# Patient Record
Sex: Male | Born: 1945
Health system: Southern US, Community
[De-identification: ages and names within clinical notes are randomized; demographics above are authoritative.]

## PROBLEM LIST (undated history)

## (undated) DIAGNOSIS — C801 Malignant (primary) neoplasm, unspecified: Secondary | ICD-10-CM

## (undated) DIAGNOSIS — G4700 Insomnia, unspecified: Secondary | ICD-10-CM

## (undated) DIAGNOSIS — K2270 Barrett's esophagus without dysplasia: Secondary | ICD-10-CM

## (undated) DIAGNOSIS — J309 Allergic rhinitis, unspecified: Secondary | ICD-10-CM

## (undated) DIAGNOSIS — B1710 Acute hepatitis C without hepatic coma: Secondary | ICD-10-CM

## (undated) DIAGNOSIS — M1990 Unspecified osteoarthritis, unspecified site: Secondary | ICD-10-CM

## (undated) DIAGNOSIS — I10 Essential (primary) hypertension: Secondary | ICD-10-CM

## (undated) DIAGNOSIS — I82509 Chronic embolism and thrombosis of unspecified deep veins of unspecified lower extremity: Secondary | ICD-10-CM

## (undated) DIAGNOSIS — K219 Gastro-esophageal reflux disease without esophagitis: Secondary | ICD-10-CM

## (undated) DIAGNOSIS — H9319 Tinnitus, unspecified ear: Secondary | ICD-10-CM

## (undated) DIAGNOSIS — N318 Other neuromuscular dysfunction of bladder: Secondary | ICD-10-CM

## (undated) DIAGNOSIS — E785 Hyperlipidemia, unspecified: Secondary | ICD-10-CM

## (undated) DIAGNOSIS — I82409 Acute embolism and thrombosis of unspecified deep veins of unspecified lower extremity: Secondary | ICD-10-CM

## (undated) DIAGNOSIS — F411 Generalized anxiety disorder: Secondary | ICD-10-CM

## (undated) DIAGNOSIS — Z87442 Personal history of urinary calculi: Secondary | ICD-10-CM

## (undated) DIAGNOSIS — I739 Peripheral vascular disease, unspecified: Secondary | ICD-10-CM

## (undated) DIAGNOSIS — N400 Enlarged prostate without lower urinary tract symptoms: Secondary | ICD-10-CM

## (undated) DIAGNOSIS — K449 Diaphragmatic hernia without obstruction or gangrene: Secondary | ICD-10-CM

## (undated) DIAGNOSIS — Q210 Ventricular septal defect: Secondary | ICD-10-CM

## (undated) DIAGNOSIS — H33039 Retinal detachment with giant retinal tear, unspecified eye: Secondary | ICD-10-CM

## (undated) DIAGNOSIS — K759 Inflammatory liver disease, unspecified: Secondary | ICD-10-CM

## (undated) DIAGNOSIS — R972 Elevated prostate specific antigen [PSA]: Secondary | ICD-10-CM

## (undated) DIAGNOSIS — R011 Cardiac murmur, unspecified: Secondary | ICD-10-CM

## (undated) DIAGNOSIS — M810 Age-related osteoporosis without current pathological fracture: Secondary | ICD-10-CM

## (undated) HISTORY — DX: Elevated prostate specific antigen (PSA): R97.20

## (undated) HISTORY — DX: Retinal detachment with giant retinal tear, unspecified eye: H33.039

## (undated) HISTORY — DX: Barrett's esophagus without dysplasia: K22.70

## (undated) HISTORY — PX: RETINAL DETACHMENT SURGERY: SHX105

## (undated) HISTORY — DX: Diaphragmatic hernia without obstruction or gangrene: K44.9

## (undated) HISTORY — DX: Unspecified osteoarthritis, unspecified site: M19.90

## (undated) HISTORY — DX: Gastro-esophageal reflux disease without esophagitis: K21.9

## (undated) HISTORY — DX: Hyperlipidemia, unspecified: E78.5

## (undated) HISTORY — DX: Benign prostatic hyperplasia without lower urinary tract symptoms: N40.0

## (undated) HISTORY — PX: TOE SURGERY: SHX1073

## (undated) HISTORY — DX: Generalized anxiety disorder: F41.1

## (undated) HISTORY — PX: COLONOSCOPY: SHX174

## (undated) HISTORY — DX: Ventricular septal defect: Q21.0

## (undated) HISTORY — DX: Acute hepatitis C without hepatic coma: B17.10

## (undated) HISTORY — DX: Essential (primary) hypertension: I10

## (undated) HISTORY — DX: Allergic rhinitis, unspecified: J30.9

## (undated) HISTORY — DX: Age-related osteoporosis without current pathological fracture: M81.0

## (undated) HISTORY — DX: Personal history of urinary calculi: Z87.442

## (undated) HISTORY — PX: KNEE SURGERY: SHX244

## (undated) HISTORY — PX: UPPER GI ENDOSCOPY: SHX6162

## (undated) HISTORY — DX: Other neuromuscular dysfunction of bladder: N31.8

## (undated) HISTORY — DX: Chronic embolism and thrombosis of unspecified deep veins of unspecified lower extremity: I82.509

## (undated) HISTORY — DX: Acute embolism and thrombosis of unspecified deep veins of unspecified lower extremity: I82.409

## (undated) HISTORY — DX: Tinnitus, unspecified ear: H93.19

## (undated) HISTORY — DX: Insomnia, unspecified: G47.00

---

## 1998-05-09 ENCOUNTER — Ambulatory Visit (HOSPITAL_COMMUNITY): Admission: RE | Admit: 1998-05-09 | Discharge: 1998-05-09 | Payer: Self-pay | Admitting: Gastroenterology

## 1999-09-26 ENCOUNTER — Ambulatory Visit (HOSPITAL_COMMUNITY): Admission: RE | Admit: 1999-09-26 | Discharge: 1999-09-26 | Payer: Self-pay | Admitting: Internal Medicine

## 1999-09-26 ENCOUNTER — Encounter: Payer: Self-pay | Admitting: Internal Medicine

## 2000-01-15 ENCOUNTER — Ambulatory Visit (HOSPITAL_BASED_OUTPATIENT_CLINIC_OR_DEPARTMENT_OTHER): Admission: RE | Admit: 2000-01-15 | Discharge: 2000-01-15 | Payer: Self-pay

## 2000-04-22 ENCOUNTER — Ambulatory Visit (HOSPITAL_COMMUNITY): Admission: RE | Admit: 2000-04-22 | Discharge: 2000-04-22 | Payer: Self-pay | Admitting: Gastroenterology

## 2000-04-22 ENCOUNTER — Encounter (INDEPENDENT_AMBULATORY_CARE_PROVIDER_SITE_OTHER): Payer: Self-pay

## 2002-04-29 ENCOUNTER — Ambulatory Visit (HOSPITAL_COMMUNITY): Admission: RE | Admit: 2002-04-29 | Discharge: 2002-04-29 | Payer: Self-pay | Admitting: Gastroenterology

## 2002-04-29 ENCOUNTER — Encounter (INDEPENDENT_AMBULATORY_CARE_PROVIDER_SITE_OTHER): Payer: Self-pay | Admitting: *Deleted

## 2003-08-30 ENCOUNTER — Inpatient Hospital Stay (HOSPITAL_COMMUNITY): Admission: EM | Admit: 2003-08-30 | Discharge: 2003-09-01 | Payer: Self-pay | Admitting: Emergency Medicine

## 2003-10-04 ENCOUNTER — Encounter: Admission: RE | Admit: 2003-10-04 | Discharge: 2004-01-02 | Payer: Self-pay | Admitting: Internal Medicine

## 2004-01-18 ENCOUNTER — Ambulatory Visit (HOSPITAL_BASED_OUTPATIENT_CLINIC_OR_DEPARTMENT_OTHER): Admission: RE | Admit: 2004-01-18 | Discharge: 2004-01-18 | Payer: Self-pay | Admitting: Orthopedic Surgery

## 2005-01-22 ENCOUNTER — Ambulatory Visit (HOSPITAL_COMMUNITY): Admission: RE | Admit: 2005-01-22 | Discharge: 2005-01-23 | Payer: Self-pay | Admitting: Ophthalmology

## 2005-04-18 ENCOUNTER — Ambulatory Visit: Payer: Self-pay | Admitting: Internal Medicine

## 2005-06-06 ENCOUNTER — Ambulatory Visit: Payer: Self-pay | Admitting: Internal Medicine

## 2005-09-16 ENCOUNTER — Ambulatory Visit: Payer: Self-pay | Admitting: Internal Medicine

## 2006-04-18 ENCOUNTER — Ambulatory Visit: Payer: Self-pay | Admitting: Internal Medicine

## 2006-04-23 ENCOUNTER — Ambulatory Visit: Payer: Self-pay | Admitting: Internal Medicine

## 2006-05-15 ENCOUNTER — Ambulatory Visit: Payer: Self-pay | Admitting: Internal Medicine

## 2006-10-07 ENCOUNTER — Ambulatory Visit: Payer: Self-pay | Admitting: Family Medicine

## 2006-10-17 ENCOUNTER — Ambulatory Visit: Payer: Self-pay | Admitting: Internal Medicine

## 2006-10-17 LAB — CONVERTED CEMR LAB
ALT: 20 units/L (ref 0–40)
AST: 27 units/L (ref 0–37)
Albumin: 3.9 g/dL (ref 3.5–5.2)
Alkaline Phosphatase: 49 units/L (ref 39–117)
BUN: 11 mg/dL (ref 6–23)
CO2: 29 meq/L (ref 19–32)
Calcium: 9.1 mg/dL (ref 8.4–10.5)
Chloride: 107 meq/L (ref 96–112)
Chol/HDL Ratio, serum: 3.8
Cholesterol: 144 mg/dL (ref 0–200)
Creatinine, Ser: 1.1 mg/dL (ref 0.4–1.5)
GFR calc non Af Amer: 73 mL/min
Glomerular Filtration Rate, Af Am: 88 mL/min/{1.73_m2}
Glucose, Bld: 125 mg/dL — ABNORMAL HIGH (ref 70–99)
HDL: 38.3 mg/dL — ABNORMAL LOW (ref >39.0)
LDL Cholesterol: 95 mg/dL (ref 0–99)
Potassium: 4.1 meq/L (ref 3.5–5.1)
Sodium: 142 meq/L (ref 135–145)
Testosterone, total: 8.0479 ng/mL
Total Bilirubin: 1.2 mg/dL (ref 0.3–1.2)
Total Protein: 6.9 g/dL (ref 6.0–8.3)
Triglyceride fasting, serum: 54 mg/dL (ref 0–149)
VLDL: 11 mg/dL (ref 0–40)

## 2007-04-24 ENCOUNTER — Ambulatory Visit: Payer: Self-pay | Admitting: Internal Medicine

## 2007-04-24 LAB — CONVERTED CEMR LAB
ALT: 22 units/L (ref 0–40)
AST: 25 units/L (ref 0–37)
Albumin: 3.9 g/dL (ref 3.5–5.2)
Alkaline Phosphatase: 56 units/L (ref 39–117)
BUN: 13 mg/dL (ref 6–23)
Basophils Absolute: 0 10*3/uL (ref 0.0–0.1)
Basophils Relative: 0.2 % (ref 0.0–1.0)
Bilirubin Urine: NEGATIVE
Bilirubin, Direct: 0.1 mg/dL (ref 0.0–0.3)
CO2: 29 meq/L (ref 19–32)
Calcium: 9.3 mg/dL (ref 8.4–10.5)
Chloride: 107 meq/L (ref 96–112)
Cholesterol: 139 mg/dL (ref 0–200)
Creatinine, Ser: 0.8 mg/dL (ref 0.4–1.5)
Eosinophils Absolute: 0.1 10*3/uL (ref 0.0–0.6)
Eosinophils Relative: 2.4 % (ref 0.0–5.0)
GFR calc Af Amer: 126 mL/min
GFR calc non Af Amer: 104 mL/min
Glucose, Bld: 91 mg/dL (ref 70–99)
HCT: 41.8 % (ref 39.0–52.0)
HDL: 33.1 mg/dL — ABNORMAL LOW (ref >39.0)
Hemoglobin, Urine: NEGATIVE
Hemoglobin: 14.3 g/dL (ref 13.0–17.0)
Hgb A1c MFr Bld: 5.8 % (ref 4.6–6.0)
Ketones, ur: NEGATIVE mg/dL
LDL Cholesterol: 94 mg/dL (ref 0–99)
Leukocytes, UA: NEGATIVE
Lymphocytes Relative: 28.9 % (ref 12.0–46.0)
MCHC: 34.2 g/dL (ref 30.0–36.0)
MCV: 90.2 fL (ref 78.0–100.0)
Monocytes Absolute: 0.6 10*3/uL (ref 0.2–0.7)
Monocytes Relative: 10.6 % (ref 3.0–11.0)
Neutro Abs: 3.4 10*3/uL (ref 1.4–7.7)
Neutrophils Relative %: 57.9 % (ref 43.0–77.0)
Nitrite: NEGATIVE
PSA: 10.38 ng/mL — ABNORMAL HIGH (ref 0.10–4.00)
Platelets: 221 10*3/uL (ref 150–400)
Potassium: 4.4 meq/L (ref 3.5–5.1)
RBC: 4.63 M/uL (ref 4.22–5.81)
RDW: 13.5 % (ref 11.5–14.6)
Sodium: 141 meq/L (ref 135–145)
Specific Gravity, Urine: >=1.03 (ref 1.000–1.03)
TSH: 1.57 microintl units/mL (ref 0.35–5.50)
Total Bilirubin: 1.2 mg/dL (ref 0.3–1.2)
Total CHOL/HDL Ratio: 4.2
Total Protein, Urine: NEGATIVE mg/dL
Total Protein: 7.7 g/dL (ref 6.0–8.3)
Triglycerides: 58 mg/dL (ref 0–149)
Urine Glucose: NEGATIVE mg/dL
Urobilinogen, UA: 0.2 (ref 0.0–1.0)
VLDL: 12 mg/dL (ref 0–40)
WBC: 5.7 10*3/uL (ref 4.5–10.5)
pH: 6 (ref 5.0–8.0)

## 2007-04-30 ENCOUNTER — Ambulatory Visit: Payer: Self-pay | Admitting: Internal Medicine

## 2007-08-20 ENCOUNTER — Ambulatory Visit: Payer: Self-pay | Admitting: Internal Medicine

## 2007-08-21 ENCOUNTER — Ambulatory Visit: Payer: Self-pay | Admitting: Internal Medicine

## 2007-08-21 ENCOUNTER — Inpatient Hospital Stay (HOSPITAL_COMMUNITY): Admission: EM | Admit: 2007-08-21 | Discharge: 2007-08-23 | Payer: Self-pay | Admitting: *Deleted

## 2007-08-21 ENCOUNTER — Ambulatory Visit: Payer: Self-pay

## 2007-08-25 ENCOUNTER — Ambulatory Visit: Payer: Self-pay | Admitting: Internal Medicine

## 2007-08-25 ENCOUNTER — Encounter: Payer: Self-pay | Admitting: Internal Medicine

## 2007-08-25 LAB — CONVERTED CEMR LAB: INR: 1

## 2007-08-27 ENCOUNTER — Ambulatory Visit: Payer: Self-pay | Admitting: Cardiology

## 2007-08-27 ENCOUNTER — Ambulatory Visit: Payer: Self-pay | Admitting: Internal Medicine

## 2007-08-27 LAB — CONVERTED CEMR LAB
INR: 0.9 (ref 0.8–1.0)
Prothrombin Time: 11.4 s (ref 10.9–13.3)

## 2007-08-31 ENCOUNTER — Ambulatory Visit: Payer: Self-pay | Admitting: Cardiology

## 2007-08-31 LAB — CONVERTED CEMR LAB
INR: 1.3 — ABNORMAL HIGH (ref 0.8–1.0)
Prothrombin Time: 14.2 s — ABNORMAL HIGH (ref 10.9–13.3)

## 2007-09-04 ENCOUNTER — Ambulatory Visit: Payer: Self-pay | Admitting: Cardiology

## 2007-09-07 ENCOUNTER — Ambulatory Visit: Payer: Self-pay | Admitting: Cardiology

## 2007-09-07 LAB — CONVERTED CEMR LAB
INR: 1.3 — ABNORMAL HIGH (ref 0.8–1.0)
Prothrombin Time: 14 s — ABNORMAL HIGH (ref 10.9–13.3)

## 2007-09-11 ENCOUNTER — Ambulatory Visit: Payer: Self-pay | Admitting: Cardiology

## 2007-09-11 LAB — CONVERTED CEMR LAB
INR: 1.8 — ABNORMAL HIGH (ref 0.8–1.0)
Prothrombin Time: 16.6 s — ABNORMAL HIGH (ref 10.9–13.3)

## 2007-09-14 ENCOUNTER — Ambulatory Visit: Payer: Self-pay

## 2007-09-17 ENCOUNTER — Ambulatory Visit: Payer: Self-pay

## 2007-09-17 LAB — CONVERTED CEMR LAB: INR: 2.4 — ABNORMAL HIGH (ref 0.8–1.0)

## 2007-09-22 ENCOUNTER — Telehealth (INDEPENDENT_AMBULATORY_CARE_PROVIDER_SITE_OTHER): Payer: Self-pay | Admitting: *Deleted

## 2007-09-23 ENCOUNTER — Ambulatory Visit: Payer: Self-pay | Admitting: Cardiology

## 2007-09-24 ENCOUNTER — Ambulatory Visit: Payer: Self-pay | Admitting: Internal Medicine

## 2007-09-24 DIAGNOSIS — J309 Allergic rhinitis, unspecified: Secondary | ICD-10-CM | POA: Insufficient documentation

## 2007-09-24 DIAGNOSIS — N400 Enlarged prostate without lower urinary tract symptoms: Secondary | ICD-10-CM

## 2007-09-24 DIAGNOSIS — M79606 Pain in leg, unspecified: Secondary | ICD-10-CM | POA: Insufficient documentation

## 2007-09-24 DIAGNOSIS — M79609 Pain in unspecified limb: Secondary | ICD-10-CM

## 2007-09-24 DIAGNOSIS — I82409 Acute embolism and thrombosis of unspecified deep veins of unspecified lower extremity: Secondary | ICD-10-CM

## 2007-09-30 ENCOUNTER — Ambulatory Visit: Payer: Self-pay | Admitting: Cardiology

## 2007-10-13 ENCOUNTER — Ambulatory Visit: Payer: Self-pay | Admitting: Internal Medicine

## 2007-10-27 ENCOUNTER — Ambulatory Visit: Payer: Self-pay | Admitting: Cardiology

## 2007-10-27 ENCOUNTER — Ambulatory Visit: Payer: Self-pay | Admitting: Internal Medicine

## 2007-10-27 DIAGNOSIS — H33039 Retinal detachment with giant retinal tear, unspecified eye: Secondary | ICD-10-CM

## 2007-10-27 DIAGNOSIS — R011 Cardiac murmur, unspecified: Secondary | ICD-10-CM

## 2007-11-02 ENCOUNTER — Telehealth: Payer: Self-pay | Admitting: Internal Medicine

## 2007-11-06 ENCOUNTER — Ambulatory Visit: Payer: Self-pay | Admitting: Cardiology

## 2007-11-06 ENCOUNTER — Ambulatory Visit: Payer: Self-pay

## 2007-11-06 ENCOUNTER — Encounter: Payer: Self-pay | Admitting: Cardiology

## 2007-11-13 ENCOUNTER — Ambulatory Visit: Payer: Self-pay | Admitting: Internal Medicine

## 2007-11-16 ENCOUNTER — Telehealth: Payer: Self-pay | Admitting: Internal Medicine

## 2007-11-25 ENCOUNTER — Ambulatory Visit: Payer: Self-pay | Admitting: Cardiovascular Disease

## 2007-11-27 ENCOUNTER — Encounter: Payer: Self-pay | Admitting: Internal Medicine

## 2007-12-23 ENCOUNTER — Ambulatory Visit: Payer: Self-pay | Admitting: Internal Medicine

## 2008-01-20 ENCOUNTER — Ambulatory Visit: Payer: Self-pay | Admitting: Cardiology

## 2008-01-27 ENCOUNTER — Ambulatory Visit: Payer: Self-pay | Admitting: Internal Medicine

## 2008-03-01 ENCOUNTER — Ambulatory Visit: Payer: Self-pay | Admitting: Cardiology

## 2008-04-15 ENCOUNTER — Telehealth: Payer: Self-pay | Admitting: Internal Medicine

## 2008-04-21 ENCOUNTER — Ambulatory Visit: Payer: Self-pay | Admitting: Internal Medicine

## 2008-04-21 LAB — CONVERTED CEMR LAB
Basophils Absolute: 0.1 10*3/uL (ref 0.0–0.1)
Calcium: 9 mg/dL (ref 8.4–10.5)
GFR calc Af Amer: 97 mL/min
HCT: 41.9 % (ref 39.0–52.0)
Hemoglobin: 14.5 g/dL (ref 13.0–17.0)
MCHC: 34.6 g/dL (ref 30.0–36.0)
MCV: 93.9 fL (ref 78.0–100.0)
Monocytes Absolute: 0.8 10*3/uL (ref 0.1–1.0)
Neutro Abs: 4.3 10*3/uL (ref 1.4–7.7)
RDW: 13.7 % (ref 11.5–14.6)
Sodium: 139 meq/L (ref 135–145)
TSH: 1.18 microintl units/mL (ref 0.35–5.50)

## 2008-04-22 ENCOUNTER — Encounter: Payer: Self-pay | Admitting: *Deleted

## 2008-04-22 DIAGNOSIS — F411 Generalized anxiety disorder: Secondary | ICD-10-CM

## 2008-04-22 DIAGNOSIS — G4700 Insomnia, unspecified: Secondary | ICD-10-CM | POA: Insufficient documentation

## 2008-04-22 DIAGNOSIS — Z8719 Personal history of other diseases of the digestive system: Secondary | ICD-10-CM | POA: Insufficient documentation

## 2008-04-22 DIAGNOSIS — F419 Anxiety disorder, unspecified: Secondary | ICD-10-CM | POA: Insufficient documentation

## 2008-04-22 DIAGNOSIS — K449 Diaphragmatic hernia without obstruction or gangrene: Secondary | ICD-10-CM | POA: Insufficient documentation

## 2008-04-22 DIAGNOSIS — M810 Age-related osteoporosis without current pathological fracture: Secondary | ICD-10-CM

## 2008-04-22 DIAGNOSIS — Z87442 Personal history of urinary calculi: Secondary | ICD-10-CM | POA: Insufficient documentation

## 2008-04-22 DIAGNOSIS — K219 Gastro-esophageal reflux disease without esophagitis: Secondary | ICD-10-CM

## 2008-04-26 ENCOUNTER — Ambulatory Visit: Payer: Self-pay | Admitting: Internal Medicine

## 2008-04-28 ENCOUNTER — Encounter: Payer: Self-pay | Admitting: Internal Medicine

## 2008-08-25 ENCOUNTER — Ambulatory Visit: Payer: Self-pay | Admitting: Internal Medicine

## 2008-10-20 ENCOUNTER — Telehealth: Payer: Self-pay | Admitting: Internal Medicine

## 2008-11-03 ENCOUNTER — Ambulatory Visit: Payer: Self-pay | Admitting: Internal Medicine

## 2008-11-03 ENCOUNTER — Encounter: Payer: Self-pay | Admitting: Internal Medicine

## 2008-12-14 ENCOUNTER — Ambulatory Visit: Payer: Self-pay | Admitting: Internal Medicine

## 2008-12-14 LAB — CONVERTED CEMR LAB
AST: 22 units/L (ref 0–37)
Albumin: 4.2 g/dL (ref 3.5–5.2)
Alkaline Phosphatase: 45 units/L (ref 39–117)
BUN: 13 mg/dL (ref 6–23)
Bilirubin, Direct: 0.2 mg/dL (ref 0.0–0.3)
Chloride: 109 meq/L (ref 96–112)
Cholesterol: 159 mg/dL (ref 0–200)
GFR calc non Af Amer: 80 mL/min
Glucose, Bld: 98 mg/dL (ref 70–99)
LDL Cholesterol: 111 mg/dL — ABNORMAL HIGH (ref 0–99)
Potassium: 4.7 meq/L (ref 3.5–5.1)
Sodium: 143 meq/L (ref 135–145)

## 2008-12-28 ENCOUNTER — Ambulatory Visit: Payer: Self-pay | Admitting: Internal Medicine

## 2008-12-28 DIAGNOSIS — N318 Other neuromuscular dysfunction of bladder: Secondary | ICD-10-CM | POA: Insufficient documentation

## 2009-02-28 ENCOUNTER — Encounter (INDEPENDENT_AMBULATORY_CARE_PROVIDER_SITE_OTHER): Payer: Self-pay | Admitting: *Deleted

## 2009-02-28 ENCOUNTER — Ambulatory Visit: Payer: Self-pay | Admitting: Internal Medicine

## 2009-04-28 ENCOUNTER — Encounter: Payer: Self-pay | Admitting: Internal Medicine

## 2009-05-30 ENCOUNTER — Ambulatory Visit: Payer: Self-pay | Admitting: Internal Medicine

## 2009-05-30 LAB — CONVERTED CEMR LAB
Alkaline Phosphatase: 50 units/L (ref 39–117)
BUN: 15 mg/dL (ref 6–23)
Bilirubin, Direct: 0.1 mg/dL (ref 0.0–0.3)
Chloride: 109 meq/L (ref 96–112)
Cholesterol: 142 mg/dL (ref 0–200)
Creatinine, Ser: 0.9 mg/dL (ref 0.4–1.5)
Glucose, Bld: 99 mg/dL (ref 70–99)
LDL Cholesterol: 94 mg/dL (ref 0–99)
Total Bilirubin: 0.8 mg/dL (ref 0.3–1.2)
Total CHOL/HDL Ratio: 4
Total Protein: 7.3 g/dL (ref 6.0–8.3)
VLDL: 14.6 mg/dL (ref 0.0–40.0)

## 2009-06-01 ENCOUNTER — Ambulatory Visit: Payer: Self-pay | Admitting: Internal Medicine

## 2009-06-01 DIAGNOSIS — L03319 Cellulitis of trunk, unspecified: Secondary | ICD-10-CM

## 2009-06-01 DIAGNOSIS — L02219 Cutaneous abscess of trunk, unspecified: Secondary | ICD-10-CM

## 2009-07-05 ENCOUNTER — Encounter: Payer: Self-pay | Admitting: Internal Medicine

## 2009-08-10 ENCOUNTER — Encounter (INDEPENDENT_AMBULATORY_CARE_PROVIDER_SITE_OTHER): Payer: Self-pay | Admitting: *Deleted

## 2009-08-29 ENCOUNTER — Ambulatory Visit: Payer: Self-pay | Admitting: Internal Medicine

## 2009-09-13 ENCOUNTER — Telehealth: Payer: Self-pay | Admitting: Internal Medicine

## 2009-11-07 DIAGNOSIS — E785 Hyperlipidemia, unspecified: Secondary | ICD-10-CM

## 2009-11-09 ENCOUNTER — Ambulatory Visit (HOSPITAL_COMMUNITY): Admission: RE | Admit: 2009-11-09 | Discharge: 2009-11-09 | Payer: Self-pay | Admitting: Cardiology

## 2009-11-09 ENCOUNTER — Encounter: Payer: Self-pay | Admitting: Cardiology

## 2009-11-09 ENCOUNTER — Ambulatory Visit: Payer: Self-pay | Admitting: Internal Medicine

## 2009-11-09 ENCOUNTER — Ambulatory Visit: Payer: Self-pay | Admitting: Cardiology

## 2009-11-09 ENCOUNTER — Ambulatory Visit: Payer: Self-pay

## 2009-11-09 DIAGNOSIS — Q210 Ventricular septal defect: Secondary | ICD-10-CM

## 2009-11-29 ENCOUNTER — Ambulatory Visit: Payer: Self-pay | Admitting: Internal Medicine

## 2009-11-30 LAB — CONVERTED CEMR LAB
AST: 25 units/L (ref 0–37)
Albumin: 4.3 g/dL (ref 3.5–5.2)
BUN: 10 mg/dL (ref 6–23)
Basophils Absolute: 0 10*3/uL (ref 0.0–0.1)
Bilirubin Urine: NEGATIVE
Calcium: 9.7 mg/dL (ref 8.4–10.5)
Cholesterol: 164 mg/dL (ref 0–200)
Creatinine, Ser: 1.1 mg/dL (ref 0.4–1.5)
HDL: 39.6 mg/dL (ref >39.00)
Hemoglobin, Urine: NEGATIVE
Ketones, ur: NEGATIVE mg/dL
LDL Cholesterol: 107 mg/dL — ABNORMAL HIGH (ref 0–99)
Leukocytes, UA: NEGATIVE
Lymphs Abs: 1.9 10*3/uL (ref 0.7–4.0)
MCV: 94.1 fL (ref 78.0–100.0)
Monocytes Relative: 9.9 % (ref 3.0–12.0)
Neutro Abs: 3.7 10*3/uL (ref 1.4–7.7)
Neutrophils Relative %: 57 % (ref 43.0–77.0)
Platelets: 185 10*3/uL (ref 150.0–400.0)
RBC: 4.93 M/uL (ref 4.22–5.81)
Specific Gravity, Urine: 1.01 (ref 1.000–1.030)
TSH: 1.31 microintl units/mL (ref 0.35–5.50)
Total CHOL/HDL Ratio: 4
Triglycerides: 85 mg/dL (ref 0.0–149.0)
Urobilinogen, UA: 0.2 (ref 0.0–1.0)
VLDL: 17 mg/dL (ref 0.0–40.0)
WBC: 6.6 10*3/uL (ref 4.5–10.5)

## 2009-12-05 ENCOUNTER — Ambulatory Visit: Payer: Self-pay | Admitting: Internal Medicine

## 2009-12-05 DIAGNOSIS — R05 Cough: Secondary | ICD-10-CM

## 2009-12-05 DIAGNOSIS — R059 Cough, unspecified: Secondary | ICD-10-CM | POA: Insufficient documentation

## 2009-12-12 ENCOUNTER — Telehealth: Payer: Self-pay | Admitting: Internal Medicine

## 2009-12-12 DIAGNOSIS — R930 Abnormal findings on diagnostic imaging of skull and head, not elsewhere classified: Secondary | ICD-10-CM | POA: Insufficient documentation

## 2009-12-15 ENCOUNTER — Ambulatory Visit: Payer: Self-pay | Admitting: Internal Medicine

## 2010-03-30 ENCOUNTER — Ambulatory Visit: Payer: Self-pay | Admitting: Internal Medicine

## 2010-03-30 DIAGNOSIS — R030 Elevated blood-pressure reading, without diagnosis of hypertension: Secondary | ICD-10-CM | POA: Insufficient documentation

## 2010-04-08 ENCOUNTER — Telehealth: Payer: Self-pay | Admitting: Family Medicine

## 2010-04-09 ENCOUNTER — Telehealth: Payer: Self-pay | Admitting: Internal Medicine

## 2010-06-11 ENCOUNTER — Telehealth: Payer: Self-pay | Admitting: Internal Medicine

## 2010-07-11 ENCOUNTER — Ambulatory Visit: Payer: Self-pay | Admitting: Internal Medicine

## 2010-07-11 LAB — CONVERTED CEMR LAB
Albumin: 4.1 g/dL (ref 3.5–5.2)
BUN: 11 mg/dL (ref 6–23)
Calcium: 9 mg/dL (ref 8.4–10.5)
Creatinine, Ser: 1 mg/dL (ref 0.4–1.5)
GFR calc non Af Amer: 84.72 mL/min (ref >60)
Glucose, Bld: 88 mg/dL (ref 70–99)
Sodium: 143 meq/L (ref 135–145)

## 2010-07-13 ENCOUNTER — Ambulatory Visit: Payer: Self-pay | Admitting: Internal Medicine

## 2010-07-13 DIAGNOSIS — L538 Other specified erythematous conditions: Secondary | ICD-10-CM | POA: Insufficient documentation

## 2010-07-13 DIAGNOSIS — H5310 Unspecified subjective visual disturbances: Secondary | ICD-10-CM | POA: Insufficient documentation

## 2010-07-17 ENCOUNTER — Encounter: Payer: Self-pay | Admitting: Internal Medicine

## 2010-09-27 ENCOUNTER — Telehealth: Payer: Self-pay | Admitting: Internal Medicine

## 2010-11-01 ENCOUNTER — Ambulatory Visit: Payer: Self-pay | Admitting: Internal Medicine

## 2010-11-13 LAB — CONVERTED CEMR LAB
Alkaline Phosphatase: 49 units/L (ref 39–117)
BUN: 12 mg/dL (ref 6–23)
Basophils Absolute: 0 10*3/uL (ref 0.0–0.1)
Basophils Relative: 0.2 % (ref 0.0–3.0)
Bilirubin Urine: NEGATIVE
Bilirubin, Direct: 0.1 mg/dL (ref 0.0–0.3)
CO2: 26 meq/L (ref 19–32)
Calcium: 8.9 mg/dL (ref 8.4–10.5)
Cholesterol: 157 mg/dL (ref 0–200)
Creatinine, Ser: 0.9 mg/dL (ref 0.4–1.5)
Eosinophils Absolute: 0.1 10*3/uL (ref 0.0–0.7)
HDL: 36.2 mg/dL — ABNORMAL LOW (ref >39.00)
Ketones, ur: NEGATIVE mg/dL
LDL Cholesterol: 108 mg/dL — ABNORMAL HIGH (ref 0–99)
Leukocytes, UA: NEGATIVE
Lymphocytes Relative: 25.2 % (ref 12.0–46.0)
MCHC: 33.6 g/dL (ref 30.0–36.0)
Monocytes Absolute: 1 10*3/uL (ref 0.1–1.0)
Neutrophils Relative %: 59.1 % (ref 43.0–77.0)
Platelets: 187 10*3/uL (ref 150.0–400.0)
RBC: 4.78 M/uL (ref 4.22–5.81)
RDW: 14.4 % (ref 11.5–14.6)
Total Bilirubin: 0.9 mg/dL (ref 0.3–1.2)
Total CHOL/HDL Ratio: 4
Triglycerides: 64 mg/dL (ref 0.0–149.0)
Urine Glucose: NEGATIVE mg/dL
Urobilinogen, UA: 0.2 (ref 0.0–1.0)
VLDL: 12.8 mg/dL (ref 0.0–40.0)

## 2010-11-14 ENCOUNTER — Telehealth: Payer: Self-pay | Admitting: Internal Medicine

## 2010-11-15 ENCOUNTER — Ambulatory Visit: Payer: Self-pay | Admitting: Internal Medicine

## 2010-11-15 ENCOUNTER — Ambulatory Visit
Admission: RE | Admit: 2010-11-15 | Discharge: 2010-11-15 | Payer: Self-pay | Source: Home / Self Care | Attending: Internal Medicine | Admitting: Internal Medicine

## 2010-11-15 ENCOUNTER — Encounter: Payer: Self-pay | Admitting: Internal Medicine

## 2010-11-15 DIAGNOSIS — I10 Essential (primary) hypertension: Secondary | ICD-10-CM

## 2010-11-15 DIAGNOSIS — J069 Acute upper respiratory infection, unspecified: Secondary | ICD-10-CM | POA: Insufficient documentation

## 2010-11-15 DIAGNOSIS — R972 Elevated prostate specific antigen [PSA]: Secondary | ICD-10-CM | POA: Insufficient documentation

## 2010-12-09 ENCOUNTER — Encounter: Payer: Self-pay | Admitting: Internal Medicine

## 2010-12-11 ENCOUNTER — Telehealth: Payer: Self-pay | Admitting: Internal Medicine

## 2010-12-18 NOTE — Assessment & Plan Note (Signed)
Summary: 3 mo rov /nws  #   Vital Signs:  Patient profile:   65 year old male Height:      73 inches Weight:      192 pounds BMI:     25.42 O2 Sat:      95 % on Room air Temp:     98.0 degrees F oral Pulse rate:   79 / minute Pulse rhythm:   regular Resp:     16 per minute BP sitting:   120 / 84  (left arm) Cuff size:   regular  Vitals Entered By: Lanier Prude, CMA(AAMA) (July 13, 2010 9:56 AM)  O2 Flow:  Room air CC: 3 mo f/u Is Patient Diabetic? No   CC:  3 mo f/u.  History of Present Illness: The patient presents for a follow up of insomnia, elev BP, leg swelling C/o rash in groin Mild vision irregularities at times  Current Medications (verified): 1)  Epipen 2-Pak 0.3 Mg/0.26ml (1:1000)  Devi (Epinephrine Hcl (Anaphylaxis)) .... As Dirr. Prn 2)  Zolpidem Tartrate 10 Mg  Tabs (Zolpidem Tartrate) .... 1/2 or 1 By Mouth At [facility] Prn 3)  Aspirin 81 Mg  Tbec (Aspirin) .... One By Mouth Every Day 4)  Zyrtec Allergy 10 Mg Tabs (Cetirizine Hcl) .... Once Daily 5)  Zegerid 20-1680 Mg Pack (Omeprazole-Sodium Bicarbonate) .... Once Daily 6)  Mucinex 600 Mg Xr12h-Tab (Guaifenesin) .... As Needed 7)  T Boost .... 1 Tab By Mouth Once Daily 8)  Prostaez .Marland Kitchen.. 1 Tab By Mouth Once Daily 9)  Ear Ringing Relief .... As Needed 10)  Niacin 250 Mg Tabs (Niacin) .Marland Kitchen.. 1 By Mouth Qd 11)  Benicar 20 Mg Tabs (Olmesartan Medoxomil) .Marland Kitchen.. 1 By Mouth Qd 12)  Bp Monitor .... Dx Elev Bp 13)  Bromday 0.09 % Soln (Bromfenac Sodium) .... As Directed 14)  Prednisolone Acetate 1 % Susp (Prednisolone Acetate) .... Use Three Times A Day  Allergies (verified): 1)  ! * Augumentin  Past History:  Past Medical History: Last updated: 11/09/2009 HYPERLIPIDEMIA (ICD-272.4) BENIGN PROSTATIC HYPERTROPHY, HX OF (ICD-V13.8) ANXIETY (ICD-300.00) OVERACTIVE BLADDER (ICD-596.51) TINNITUS NOS (ICD-388.30) RENAL CALCULUS, HX OF (ICD-V13.01) GERD (ICD-530.81) HEPATITIS C (ICD-070.51) OSTEOPOROSIS  (ICD-733.00) BARRETT'S ESOPHAGUS, HX OF (ICD-V12.79) HIATAL HERNIA (ICD-553.3) INSOMNIA, PERSISTENT (ICD-307.42) DVT (ICD-453.40) ALLERGIC RHINITIS (ICD-477.9) RECENT RETINAL DETACHMENT PARTIAL W/GIANT TEAR (ICD-361.03) * Hx of REPAIR OF DETACHED RETINA ON  RIGHT EYE. Ventricular septal defect Elev PSA Dr Vernie Ammons    Past Surgical History: Last updated: 11/07/2009 * Hx of REPAIR OF DETACHED RETINA ON  RIGHT EYE.  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    Physical Exam  General:  Well-developed,well-nourished,in no acute distress; alert,appropriate and cooperative throughout examination Head:  Normocephalic and atraumatic without obvious abnormalities. No apparent alopecia or balding. Eyes:  No corneal or conjunctival inflammation noted. EOMI. Perrla. B eyes are soft and NT Nose:  External nasal examination shows no deformity or inflammation. Nasal mucosa are pink and moist without lesions or exudates. Mouth:  Oral mucosa and oropharynx without lesions or exudates.  Teeth in good repair. Neck:  No deformities, masses, or tenderness noted. Lungs:  Normal respiratory effort, chest expands symmetrically. Lungs are clear to auscultation, no crackles or wheezes. Heart:  Normal rate and regular rhythm. S1 and S2 normal without  gallop, murmur, click, rub or other extra sounds. Abdomen:  Bowel sounds positive,abdomen soft and non-tender without masses, organomegaly or hernias noted. Msk:  No deformity or scoliosis noted of thoracic or lumbar spine.   Extremities:  trace left pedal edema.   Neurologic:  No cranial nerve deficits noted. Station and gait are normal. Plantar reflexes are down-going bilaterally. DTRs are symmetrical throughout. Sensory, motor and coordinative  functions appear intact. Skin:  rash in groin Psych:  Cognition and judgment appear intact. Alert and cooperative with normal attention span and concentration. No apparent delusions, illusions, hallucinations   Impression & Recommendations:  Problem # 1:  ELEVATED BLOOD PRESSURE (ICD-796.2) Assessment Improved  His updated medication list for this problem includes:    Benicar 20 Mg Tabs (Olmesartan medoxomil) .Marland Kitchen... 1 by mouth once daily - not taking  Problem # 2:  HYPERLIPIDEMIA (ICD-272.4) Assessment: Unchanged  His updated medication list for this problem includes:    Niacin 250 Mg Tabs (Niacin) .Marland Kitchen... 1 by mouth qd  Problem # 3:  ANXIETY (ICD-300.00) Assessment: Unchanged  Problem # 4:  GERD (ICD-530.81) Assessment: Unchanged  His updated medication list for this problem includes:    Zegerid 20-1680 Mg Pack (Omeprazole-sodium bicarbonate) ..... Once daily  Problem # 5:  HEPATITIS C (ICD-070.51) Assessment: Unchanged  Problem # 6:  VISUAL CHANGES (ICD-368.10) L Assessment: New Ophth consult - he will see his eye doctor  Problem # 7:  INTERTRIGO, CANDIDAL (ZOX-096.04) Assessment: New Ketocon cream  Complete Medication List: 1)  Epipen 2-pak 0.3 Mg/0.73ml (1:1000) Devi (Epinephrine hcl (anaphylaxis)) .... As dirr. prn 2)  Zolpidem Tartrate 10 Mg Tabs (Zolpidem tartrate) .... 1/2 or 1 by mouth at hs prn 3)  Aspirin 81 Mg Tbec (Aspirin) .... One by mouth every day 4)  Zyrtec Allergy 10 Mg Tabs (Cetirizine hcl) .... Once daily 5)  Zegerid 20-1680 Mg Pack (Omeprazole-sodium bicarbonate) .... Once daily 6)  Niacin 250 Mg Tabs (Niacin) .Marland Kitchen.. 1 by mouth qd 7)  Benicar 20 Mg Tabs (Olmesartan medoxomil) .Marland Kitchen.. 1 by mouth qd 8)  Bromday 0.09 % Soln (Bromfenac sodium) .... As directed 9)  Prednisolone Acetate 1 % Susp (Prednisolone acetate) .... Use three times a day 10)  Ketoconazole 2 % Crea (Ketoconazole) .... Use bid 11)  Prostaez  .Marland KitchenMarland Kitchen. 1 tab by mouth once daily 12)  Bp Monitor   .... Dx elev bp 13)  Ear Ringing Relief  .... As needed  Other Orders: Admin 1st Vaccine (54098) Flu Vaccine 56yrs + (11914)  Patient Instructions: 1)  Please schedule a follow-up appointment in 4 months well w/labs. Prescriptions: ZOLPIDEM TARTRATE 10 MG  TABS (ZOLPIDEM TARTRATE) 1/2 or 1 by mouth at hs prn  #30 x 6   Entered and Authorized by:   Tresa Garter MD   Signed by:   Tresa Garter MD on 07/13/2010   Method used:   Print then Give to Patient   RxID:   [id number] KETOCONAZOLE 2 % CREA (KETOCONAZOLE) use bid  #90 g x 3   Entered and Authorized by:   Tresa Garter MD   Signed by:   Tresa Garter MD on 07/13/2010   Method used:   Electronically to        [facility] [id number] (retail)       [address] [place], [place]  [address]       Ph: [phone]  Fax: [phone]   RxID:   [id number]     .lbflu    Flu Vaccine Consent Questions     Do you have a history of severe allergic reactions to this vaccine? no    Any prior history of allergic reactions to egg and/or gelatin? no    Do you have a sensitivity to the preservative Thimersol? no    Do you have a past history of Guillan-Barre Syndrome? no    Do you currently have an acute febrile illness? no    Have you ever had a severe reaction to latex? no    Vaccine information given and explained to patient? yes    Are you currently pregnant? no    Lot Number:[id number]   Exp Date:05/18/2011   Site Given  Right Deltoid IM Lanier Prude, [facility])  July 13, 2010 11:13 AM

## 2010-12-18 NOTE — Letter (Signed)
Summary: [facility]   Imported By: Lennie Odor 07/31/2010 11:32:42  _____________________________________________________________________  External Attachment:    Type:   Image     Comment:   External Document

## 2010-12-18 NOTE — Progress Notes (Signed)
Summary: Wendi Maya  Phone Note Other Incoming   Summary of Call: Mr. Kinser called back and stated it was ok to go ahead and schedule a CT for him.  Initial call taken by: Tora Perches,  December 12, 2009 10:39 AM  Follow-up for Phone Call        OK Thx Follow-up by: Tresa Garter MD,  December 12, 2009 1:03 PM  New Problems: ABNORMAL CHEST XRAY (ICD-793.1)   New Problems: ABNORMAL CHEST XRAY (ICD-793.1)

## 2010-12-18 NOTE — Assessment & Plan Note (Signed)
Summary: 4 MO ROV /NWS   Vital Signs:  Patient profile:   65 year old male Height:      73 inches Weight:      193.25 pounds BMI:     25.59 O2 Sat:      94 % on Room air Temp:     97.3 degrees F oral Pulse rate:   73 / minute BP sitting:   152 / 110  (left arm) Cuff size:   regular  Vitals Entered By: Lucious Groves (Mar 30, 2010 10:11 AM)  O2 Flow:  Room air CC: 4 mo rtn./kb Is Patient Diabetic? No Pain Assessment Patient in pain? no        CC:  4 mo rtn./kb.  History of Present Illness: The patient presents for a follow up of edema, hypertension, diabetes, hyperlipidemia   Current Medications (verified): 1)  Epipen 2-Pak 0.3 Mg/0.13ml (1:1000)  Devi (Epinephrine Hcl (Anaphylaxis)) .... As Dirr. Prn 2)  Zolpidem Tartrate 10 Mg  Tabs (Zolpidem Tartrate) .... 1/2 or 1 By Mouth At [facility] Prn 3)  Aspirin 81 Mg  Tbec (Aspirin) .... One By Mouth Every Day 4)  Zyrtec Allergy 10 Mg Tabs (Cetirizine Hcl) .... Once Daily 5)  Zegerid 20-1680 Mg Pack (Omeprazole-Sodium Bicarbonate) .... Once Daily 6)  Mucinex 600 Mg Xr12h-Tab (Guaifenesin) .... As Needed 7)  T Boost .... 1 Tab By Mouth Once Daily 8)  Prostaez .Marland Kitchen.. 1 Tab By Mouth Once Daily 9)  Ear Ringing Relief .... As Needed 10)  Niacin 250 Mg Tabs (Niacin) .Marland Kitchen.. 1 By Mouth Qd  Allergies (verified): 1)  ! * Augumentin  Past History:  Past Medical History: Last updated: 11/09/2009 HYPERLIPIDEMIA (ICD-272.4) BENIGN PROSTATIC HYPERTROPHY, HX OF (ICD-V13.8) ANXIETY (ICD-300.00) OVERACTIVE BLADDER (ICD-596.51) TINNITUS NOS (ICD-388.30) RENAL CALCULUS, HX OF (ICD-V13.01) GERD (ICD-530.81) HEPATITIS C (ICD-070.51) OSTEOPOROSIS (ICD-733.00) BARRETT'S ESOPHAGUS, HX OF (ICD-V12.79) HIATAL HERNIA (ICD-553.3) INSOMNIA, PERSISTENT (ICD-307.42) DVT (ICD-453.40) ALLERGIC RHINITIS (ICD-477.9) RECENT RETINAL DETACHMENT PARTIAL W/GIANT TEAR (ICD-361.03) * Hx of REPAIR OF DETACHED RETINA ON  RIGHT EYE. 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    Physical Exam  General:  Well-developed,well-nourished,in no acute distress; alert,appropriate and cooperative throughout examination Nose:  External nasal examination shows no deformity or inflammation. Nasal mucosa are pink and moist without lesions or exudates. Mouth:  Oral mucosa and oropharynx without lesions or exudates.  Teeth in good repair. Neck:  No deformities, masses, or tenderness noted. Abdomen:  Bowel sounds positive,abdomen soft and non-tender without masses, organomegaly or hernias noted. Msk:  No deformity or scoliosis noted of thoracic or lumbar spine.   Pulses:  R and L carotid,radial,femoral,dorsalis pedis and posterior tibial pulses are full and equal bilaterally Extremities:  trace left pedal edema.   Neurologic:  No cranial nerve deficits noted. Station and gait are normal. Plantar reflexes are down-going bilaterally. DTRs are symmetrical throughout. Sensory, motor and coordinative functions appear intact. Skin:  Clear Psych:  Cognition and judgment appear intact. Alert and cooperative with normal attention span and concentration. No apparent delusions, illusions, hallucinations   Impression & Recommendations:  Problem # 1:  HYPERLIPIDEMIA (ICD-272.4) Assessment Improved  His updated medication list for this problem includes:    Niacin 250 Mg Tabs (Niacin) .Marland Kitchen... 1 by mouth qd  Labs  Reviewed: SGOT: 25 (11/29/2009)   SGPT: 20 (11/29/2009)   HDL:39.60 (11/29/2009), 33.20 (05/30/2009)  LDL:107 (11/29/2009), 94 (40/34/7425)  Chol:164 (11/29/2009), 142 (05/30/2009)  Trig:85.0 (11/29/2009), 73.0 (05/30/2009)  Problem # 2:  ELEVATED BLOOD PRESSURE (ICD-796.2) Assessment: Deteriorated  His updated  medication list for this problem includes:    Benicar 20 Mg Tabs (Olmesartan medoxomil) .Marland Kitchen... 1 by mouth once daily if BP is up at home  Problem # 3:  ANXIETY (ICD-300.00) Assessment: Unchanged  Problem # 4:  ALLERGIC RHINITIS (ICD-477.9) Assessment: Improved  His updated medication list for this problem includes:    Zyrtec Allergy 10 Mg Tabs (Cetirizine hcl) ..... Once daily  Complete Medication List: 1)  Epipen 2-pak 0.3 Mg/0.4ml (1:1000) Devi (Epinephrine hcl (anaphylaxis)) .... As dirr. prn 2)  Zolpidem Tartrate 10 Mg Tabs (Zolpidem tartrate) .... 1/2 or 1 by mouth at hs prn 3)  Aspirin 81 Mg Tbec (Aspirin) .... One by mouth every day 4)  Zyrtec Allergy 10 Mg Tabs (Cetirizine hcl) .... Once daily 5)  Zegerid 20-1680 Mg Pack (Omeprazole-sodium bicarbonate) .... Once daily 6)  Mucinex 600 Mg Xr12h-tab (Guaifenesin) .... As needed 7)  T Boost  .... 1 tab by mouth once daily 8)  Prostaez  .Marland KitchenMarland Kitchen. 1 tab by mouth once daily 9)  Ear Ringing Relief  .... As needed 10)  Niacin 250 Mg Tabs (Niacin) .Marland Kitchen.. 1 by mouth qd 11)  Benicar 20 Mg Tabs (Olmesartan medoxomil) .Marland Kitchen.. 1 by mouth qd 12)  Bp Monitor  .... Dx elev bp  Patient Instructions: 1)  Normal BP <130/85 2)  Please schedule a follow-up appointment in 3 months. 3)  BMP prior to visit, ICD-9: 401.1 4)  Hepatic Panel prior to visit, ICD-9: Prescriptions: BP MONITOR Dx elev BP  #1 x 0   Entered and Authorized by:   Tresa Garter MD   Signed by:   Tresa Garter MD on 03/30/2010   Method used:   Print then Give to Patient   RxID:   [id number] BENICAR 20 MG TABS (OLMESARTAN MEDOXOMIL) 1 by mouth qd  #30 x 12   Entered and Authorized by:   Tresa Garter MD   Signed by:   Tresa Garter MD on 03/30/2010   Method used:   Print then Give to Patient   RxID:   [id number]

## 2010-12-18 NOTE — Assessment & Plan Note (Signed)
Summary: CPX / $50 /NWS   Vital Signs:  Patient profile:   65 year old male Weight:      202 pounds Temp:     97.9 degrees F oral Pulse rate:   81 / minute BP sitting:   144 / 88  (left arm)  Vitals Entered By: Tora Perches (December 05, 2009 10:34 AM) CC: cpx Is Patient Diabetic? No   CC:  cpx.  History of Present Illness: The patient presents for a wellness examination   Preventive Screening-Counseling & Management  Alcohol-Tobacco     Smoking Status: never  Current Medications (verified): 1)  Epipen 2-Pak 0.3 Mg/0.64ml (1:1000)  Devi (Epinephrine Hcl (Anaphylaxis)) .... As Dirr. Prn 2)  Zolpidem Tartrate 10 Mg  Tabs (Zolpidem Tartrate) .... 1/2 or 1 By Mouth At [facility] Prn 3)  Aspirin 81 Mg  Tbec (Aspirin) .... One By Mouth Every Day 4)  Zyrtec Allergy 10 Mg Tabs (Cetirizine Hcl) .... Once Daily 5)  Zegerid 20-1680 Mg Pack (Omeprazole-Sodium Bicarbonate) .... Once Daily 6)  T Boost .... 1 Tab By Mouth Once Daily 7)  Prostaez .Marland Kitchen.. 1 Tab By Mouth Once Daily 8)  Amoxicillin 500 Mg Caps (Amoxicillin) .... As Needed With Dental Visit 9)  Mucinex 600 Mg Xr12h-Tab (Guaifenesin) .... As Needed 10)  Ear Ringing Relief .... As Needed  Allergies: 1)  ! * Augumentin  Past History:  Past Medical History: Last updated: 11/09/2009 HYPERLIPIDEMIA (ICD-272.4) BENIGN PROSTATIC HYPERTROPHY, HX OF (ICD-V13.8) ANXIETY (ICD-300.00) OVERACTIVE BLADDER (ICD-596.51) TINNITUS NOS (ICD-388.30) RENAL CALCULUS, HX OF (ICD-V13.01) GERD (ICD-530.81) HEPATITIS C (ICD-070.51) OSTEOPOROSIS (ICD-733.00) BARRETT'S ESOPHAGUS, HX OF (ICD-V12.79) HIATAL HERNIA (ICD-553.3) INSOMNIA, PERSISTENT (ICD-307.42) DVT (ICD-453.40) ALLERGIC RHINITIS (ICD-477.9) RECENT RETINAL DETACHMENT PARTIAL W/GIANT TEAR (ICD-361.03) * Hx of REPAIR OF DETACHED RETINA ON  RIGHT EYE. Ventricular septal defect Elev PSA Dr Vernie Ammons    Past Surgical History: Last updated: 11/07/2009 * Hx of REPAIR OF DETACHED RETINA ON   RIGHT EYE.  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         LBP  Physical Exam  General:  Well-developed,well-nourished,in no acute distress; alert,appropriate and cooperative throughout examination Head:  Normocephalic and atraumatic without obvious abnormalities. No apparent alopecia or balding. Eyes:  No corneal or conjunctival inflammation noted. EOMI. Perrla Ears:  External ear exam shows no significant lesions or deformities.  Otoscopic examination reveals clear canals, tympanic membranes are intact bilaterally without bulging, retraction, inflammation or discharge. Hearing is grossly normal bilaterally. Nose:  External nasal examination shows no deformity or inflammation. Nasal mucosa are pink and moist without lesions or exudates. Mouth:  Oral mucosa and oropharynx without lesions or exudates.  Teeth in good repair. Neck:  No deformities, masses, or tenderness noted. Lungs:  Normal respiratory effort, chest expands symmetrically. Lungs are clear to auscultation, no crackles or wheezes. Heart:  Normal rate and regular rhythm. S1 and S2 normal without gallop, murmur, click, rub or other extra sounds. Abdomen:  Bowel sounds positive,abdomen soft and non-tender  without masses, organomegaly  or hernias noted. Rectal:  per Janetta Hora Genitalia:  per urol Prostate:  per urol Msk:  No deformity or scoliosis noted of thoracic or lumbar spine.   Pulses:  R and L carotid,radial,femoral,dorsalis pedis and posterior tibial pulses are full and equal bilaterally Extremities:  trace left pedal edema.   Neurologic:  No cranial nerve deficits noted. Station and gait are normal. Plantar reflexes are down-going bilaterally. DTRs are symmetrical throughout. Sensory, motor and coordinative functions appear intact. Skin:  Clear Cervical Nodes:  No lymphadenopathy noted Inguinal Nodes:  No significant adenopathy Psych:  Cognition and judgment appear intact. Alert and cooperative with normal attention span and concentration. No apparent delusions, illusions, hallucinations   Impression & Recommendations:  Problem # 1:  PHYSICAL EXAMINATION (ICD-V70.0) Assessment Comment Only The labs were reviewed with the patient. Health and age related issues were discussed. Available screening tests and vaccinations were discussed as well. Healthy life style including good diet and execise was discussed.   Problem # 2:  GERD (ICD-530.81)  His updated medication list for this problem includes:    Zegerid 20-1680 Mg Pack (Omeprazole-sodium bicarbonate) ..... Once daily  Problem # 3:  MURMUR (ICD-785.2) Had ECHO  Problem # 4:  TINNITUS NOS (ICD-388.30) Assessment: Unchanged  Problem # 5:  BENIGN PROSTATIC HYPERTROPHY, HX OF (ICD-V13.8) Assessment: Improved F/u w/Dr Vernie Ammons  Complete Medication List: 1)  Epipen 2-pak 0.3 Mg/0.19ml (1:1000) Devi (Epinephrine hcl (anaphylaxis)) .... As dirr. prn 2)  Zolpidem Tartrate 10 Mg Tabs (Zolpidem tartrate) .... 1/2 or 1 by mouth at hs prn 3)  Aspirin 81 Mg Tbec (Aspirin) .... One by mouth every day 4)  Zyrtec Allergy 10 Mg Tabs (Cetirizine hcl) .... Once daily 5)  Zegerid 20-1680 Mg Pack (Omeprazole-sodium bicarbonate) .... Once daily 6)  Mucinex 600 Mg Xr12h-tab  (Guaifenesin) .... As needed 7)  T Boost  .... 1 tab by mouth once daily 8)  Prostaez  .Marland KitchenMarland Kitchen. 1 tab by mouth once daily 9)  Ear Ringing Relief  .... As needed  Other Orders: T-2 View CXR, Same Day ([id number])  Patient Instructions: 1)  Please schedule a follow-up appointment in 4 months. Prescriptions: ZOLPIDEM TARTRATE 10 MG  TABS (ZOLPIDEM TARTRATE) 1/2 or 1 by mouth at hs prn  #30 x 6   Entered and Authorized by:   Tresa Garter MD   Signed by:   Tresa Garter MD on 12/05/2009   Method used:   Print then Give to Patient   RxID:   [id number]

## 2010-12-18 NOTE — Progress Notes (Signed)
Summary: after hrs call  Phone Note Other Incoming   Caller: Call A Nurse Summary of Call: Recieved after hrs call on Sat, 5/21.  Pt's wife reports that Roger Kent recieved a tick bite a couple days ago and she wanted me to call him in antibtiotics.  I informted nurse that we do not call in antibiotics as policy.  If pt has redeness, fever or rash, visit UC this weekend.  O/W can f/u with PCP Monday if any problems.   Initial call taken by: Seymour Bars DO,  Apr 08, 2010 8:25 PM

## 2010-12-18 NOTE — Progress Notes (Signed)
Summary: Rf Ambien  Phone Note Refill Request Message from:  Fax from Pharmacy  Refills Requested: Medication #1:  ZOLPIDEM TARTRATE 10 MG  TABS 1/2 or 1 by mouth at hs prn   Dosage confirmed as above?Dosage Confirmed   Supply Requested: 30   Last Refilled: 05/14/2010  Method Requested: Telephone to Pharmacy Initial call taken by: Lanier Prude, [facility]),  June 11, 2010 8:36 AM  Follow-up for Phone Call        ok x 6 ref Follow-up by: Tresa Garter MD,  June 11, 2010 9:45 PM    Prescriptions: ZOLPIDEM TARTRATE 10 MG  TABS (ZOLPIDEM TARTRATE) 1/2 or 1 by mouth at hs prn  #30 x 6   Entered by:   Lanier Prude, [facility])   Authorized by:   Tresa Garter MD   Signed by:   Lanier Prude, CMA(AAMA) on 06/12/2010   Method used:   Telephoned to ...       [facility] [phone]* (retail)       [address] [place], [place]  [address]       Ph: [phone]       Fax: [phone]   RxID:   [id number]

## 2010-12-18 NOTE — Progress Notes (Signed)
Summary: Call Report  Phone Note Other Incoming   Caller: [facility] Call Report Summary of Call: [facility] Triage Call Report Triage Record Num: [id number] Operator: Tomasita Crumble Patient Name: Roger Kent Call Date & Time: 04/07/2010 4:53:16PM Patient Phone: [phone] PCP: Sonda Primes Patient Gender: Male PCP Fax : [phone] Patient DOB: [birth year] Practice Name: [facility] Reason for Call: Lynn/ spouse calling about Roger Kent having a tick bite - found on him 04/07/2010. Tick was removed prior to call. Home care advised per Bites and Stings protocol.Stevphen Meuse states concerns that pt. has to take antibiotics prior to dental work and questions if he needs a single dose of Doxycycline. Dr. Cathey Endow on call; contacted per nursing judgement. Orders received: If he develosps rash, fever or redness to see MD on 5/23. Caller informed of same. Protocol(s) Used: Bites and Stings - Insects / Spiders Recommended Outcome per Protocol: Provide Home/Self Care Reason for Outcome: Tick bite Care Advice:  ~ Call provider if symptoms worsen or new symptoms develop. If you find a tick on yourself, it is important to take it off as soon as possible. The tick transmitting [facility] Spotted Fever can cause an infection after several hours of attachment. The risk of getting Lyme disease is much lower if the tick is removed within 36 hours.  ~ See a provider today if develop rash (may be bulls-eye type or red, spotted rash), fever, headache; joint or muscle pain, or swollen lymph nodes within 30 days of a tick bite.  ~ After the tick is removed, wash hands with soap and water and disinfect the bite site with iodine scrub, rubbing alcohol, or soap and water.  ~ CDC does not recommend tetanus prophylaxis for insect bites. But this can be a good time to check and confirm that tetanus is up to date.  ~ 05/ Initial call taken by: Margaret Pyle, CMA,  Apr 09, 2010 9:03  AM  Follow-up for Phone Call        It is not recommended to take antibiotic for every tic bite (most of them by far do not carry infection), however, we will call in Doxy - can take if very worried or if rash, fever etc Follow-up by: Tresa Garter MD,  Apr 09, 2010 9:36 AM  Additional Follow-up for Phone Call Additional follow up Details #1::        Left detailed vm on hm # Additional Follow-up by: Lamar Sprinkles, CMA,  Apr 09, 2010 10:32 AM    New/Updated Medications: DOXYCYCLINE HYCLATE 100 MG CAPS (DOXYCYCLINE HYCLATE) 1 by mouth two times a day with a glass of water Prescriptions: DOXYCYCLINE HYCLATE 100 MG CAPS (DOXYCYCLINE HYCLATE) 1 by mouth two times a day with a glass of water  #20 x 0   Entered and Authorized by:   Tresa Garter MD   Signed by:   Lamar Sprinkles, CMA on 04/09/2010   Method used:   Electronically to        [facility] [phone]* (retail)       [address] [place], [place]  [address]       Ph: [phone]       Fax: [phone]   RxID:   [id number]

## 2010-12-18 NOTE — Progress Notes (Signed)
Summary: WIFE CONCERNS  Phone Note Call from Patient Call back at Home Phone [phone] Call back at Wife Cell [phone]   Summary of Call: Pt's wife left walk in sheet. " Please discontine the narcotic sleeping pill. 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 I asked Dr Vernie Ammons (enlarged prostate issues & high psa's) to run testosterone labs when he sees him Dec 20th - Labs Dec 15th. Have pt see you, Dr Macario Golds prior to refilling his narcotic. He has been a drug addict and alcoholic"  FYI - Pt has f/u office visit Dec 29th. Initial call taken by: Lamar Sprinkles, CMA,  September 27, 2010 6:21 PM  Follow-up for Phone Call        Noted. He does not take Narcotic Rx Follow-up by: Tresa Garter MD,  September 28, 2010 9:19 AM

## 2010-12-20 NOTE — Miscellaneous (Signed)
Summary: BONE DENSITY  Clinical Lists Changes  Orders: Added new Test order of T-Lumbar Vertebral Assessment (77082) - Signed 

## 2010-12-20 NOTE — Assessment & Plan Note (Signed)
Summary: 4 MO ROV /NWS  #   Vital Signs:  Patient profile:   65 year old male Height:      73 inches Weight:      193 pounds BMI:     25.56 Temp:     98.6 degrees F oral Pulse rate:   88 / minute Pulse rhythm:   regular Resp:     16 per minute BP sitting:   150 / 96  (left arm) Cuff size:   regular  Vitals Entered By: Lanier Prude, CMA(AAMA) (November 15, 2010 9:06 AM) CC: 4 mo f/u  Is Patient Diabetic? No   CC:  4 mo f/u .  History of Present Illness: The patient presents for a follow up of hypertension, dyslipidemia, BPH, insomnia. C/o URI Larita Fife wanted him to check testosterone. He never took Benicar  Current Medications (verified): 1)  Epipen 2-Pak 0.3 Mg/0.32ml (1:1000)  Devi (Epinephrine Hcl (Anaphylaxis)) .... As Dirr. Prn 2)  Zolpidem Tartrate 10 Mg  Tabs (Zolpidem Tartrate) .... 1/2 or 1 By Mouth At [facility] Prn 3)  Aspirin 81 Mg  Tbec (Aspirin) .... One By Mouth Every Day 4)  Zyrtec Allergy 10 Mg Tabs (Cetirizine Hcl) .... Once Daily 5)  Zegerid 20-1680 Mg Pack (Omeprazole-Sodium Bicarbonate) .... Once Daily 6)  Niacin 250 Mg Tabs (Niacin) .Marland Kitchen.. 1 By Mouth Qd 7)  Benicar 20 Mg Tabs (Olmesartan Medoxomil) .Marland Kitchen.. 1 By Mouth Qd 8)  Bromday 0.09 % Soln (Bromfenac Sodium) .... As Directed 9)  Prednisolone Acetate 1 % Susp (Prednisolone Acetate) .... Use Three Times A Day 10)  Prostaez .Marland Kitchen.. 2 Tab By Mouth Two Times A Day 11)  Bp Monitor .... Dx Elev Bp 12)  Ear Ringing Relief .... As Needed 13)  Rapaflo 8 Mg Caps (Silodosin) .Marland Kitchen.. 1 By Mouth Once Daily  Allergies (verified): 1)  ! * Augumentin  Past History:  Past Surgical History: Last updated: 11/07/2009 * Hx of REPAIR OF DETACHED RETINA ON  RIGHT EYE.  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 Ventricular septal defect Elev PSA Dr Vernie Ammons   Hypertension  Review of Systems  The patient denies fever, weight loss, weight gain, vision loss, chest pain, syncope, abdominal pain, and depression.    Physical Exam  General:  Well-developed,well-nourished,in no acute distress; alert,appropriate and cooperative throughout examination Head:  Normocephalic and atraumatic without obvious abnormalities. No apparent alopecia or balding. Nose:  External nasal examination shows no deformity or inflammation. Nasal mucosa are pink and moist without lesions or exudates. Mouth:  Oral mucosa and oropharynx without lesions or exudates.  Teeth in good repair. Neck:  No deformities, masses, or tenderness noted. Lungs:  Normal respiratory effort, chest expands symmetrically. Lungs are clear to auscultation, no crackles or wheezes. Heart:  Normal rate and regular rhythm. S1 and S2 normal without gallop, murmur, click, rub or other extra sounds. Abdomen:  Bowel sounds positive,abdomen soft and non-tender without masses, organomegaly or hernias noted. Msk:  No deformity or scoliosis noted of thoracic or lumbar  spine.   Extremities:  trace left pedal edema.   Neurologic:  No cranial nerve deficits noted. Station and gait are normal. Plantar reflexes are down-going bilaterally. DTRs are symmetrical throughout. Sensory, motor and coordinative functions appear intact. Skin:  rash in groin Psych:  Cognition and judgment appear intact. Alert and cooperative with normal attention  span and concentration. No apparent delusions, illusions, hallucinations   Impression & Recommendations:  Problem # 1:  HYPERTENSION (ICD-401.9) Assessment Deteriorated  His updated medication list for this problem includes:    Benicar 20 Mg Tabs (Olmesartan medoxomil) .Marland Kitchen... 1 by mouth once daily - RESTART  Problem # 2:  ANXIETY (ICD-300.00) Assessment: Unchanged  Problem # 3:  HYPERLIPIDEMIA (ICD-272.4) Assessment: Unchanged The labs were reviewed with the patient.  His updated medication list for this problem includes:    Niacin 250 Mg Tabs (Niacin) .Marland Kitchen... 1 by mouth qd  Problem # 4:  DVT (ICD-453.40) Assessment: Unchanged  Problem # 5:  BENIGN PROSTATIC HYPERTROPHY (ICD-600.00) Assessment: Improved  His updated medication list for this problem includes:    Rapaflo 8 Mg Caps (Silodosin) .Marland Kitchen... 1 by mouth once daily  Problem # 6:  PSA, INCREASED (ICD-790.93) Assessment: Unchanged Followed by Dr Vernie Ammons  Problem # 7:  UPPER RESPIRATORY INFECTION, ACUTE (ICD-465.9) Assessment: New Zpac if worse His updated medication list for this problem includes:    Aspirin 81 Mg Tbec (Aspirin) ..... One by mouth every day    Zyrtec Allergy 10 Mg Tabs (Cetirizine hcl) ..... Once daily  Problem # 8:  OSTEOPOROSIS (ICD-733.00) Assessment: Unchanged  Orders: T-Bone Densitometry ([address])  Complete Medication List: 1)  Epipen 2-pak 0.3 Mg/0.59ml (1:1000) Devi (Epinephrine hcl (anaphylaxis)) .... As dirr. prn 2)  Zolpidem Tartrate 10 Mg Tabs (Zolpidem tartrate) .... 1/2 or 1 by mouth at hs prn 3)  Aspirin 81 Mg Tbec (Aspirin) .... One by mouth every day 4)  Zyrtec Allergy 10 Mg Tabs (Cetirizine hcl) .... Once daily 5)  Zegerid 20-1680 Mg Pack (Omeprazole-sodium bicarbonate) .... Once daily 6)  Niacin 250 Mg Tabs (Niacin) .Marland Kitchen.. 1 by mouth qd 7)  Benicar 20 Mg Tabs (Olmesartan medoxomil) .Marland Kitchen.. 1 by mouth qd 8)  Bromday 0.09 % Soln (Bromfenac sodium) .... As directed 9)  Prednisolone Acetate  1 % Susp (Prednisolone acetate) .... Use three times a day 10)  Prostaez  .Marland Kitchen.. 2 tab by mouth two times a day 11)  Bp Monitor  .... Dx elev bp 12)  Ear Ringing Relief  .... As needed 13)  Rapaflo 8 Mg Caps (Silodosin) .Marland Kitchen.. 1 by mouth once daily 14)  Zithromax Z-pak 250 Mg Tabs (Azithromycin) .... As dirrected  Patient Instructions: 1)  Please schedule a follow-up appointment in 3 months. 2)  BMP prior to visit, ICD-9: 3)  testost 596.0 Prescriptions: ZITHROMAX Z-PAK 250 MG TABS (AZITHROMYCIN) as dirrected  #1 x 0   Entered and Authorized by:   Tresa Garter MD   Signed by:   Tresa Garter MD on 11/15/2010   Method used:   Electronically to        [facility] [address] (retail)       [address] [place], [place]  [address]       Ph: [phone]       Fax: [phone]   RxID:   [id number] BENICAR 20 MG TABS (OLMESARTAN MEDOXOMIL) 1 by mouth qd  #30 x 12  Entered and Authorized by:   Tresa Garter MD   Signed by:   Tresa Garter MD on 11/15/2010   Method used:   Print then Give to Patient   RxID:   [id number] ZITHROMAX Z-PAK 250 MG TABS (AZITHROMYCIN) as dirrected  #1 x 0   Entered and Authorized by:   Tresa Garter MD   Signed by:   Tresa Garter MD on 11/15/2010   Method used:   Print then Give to Patient   RxID:   [id number]    Orders Added: 1)  T-Bone Densitometry [77080] 2)  Est. Patient Level IV [01027]

## 2010-12-20 NOTE — Progress Notes (Signed)
Summary: Rf Ambien  Phone Note Refill Request Message from:  Fax from Pharmacy  Refills Requested: Medication #1:  ZOLPIDEM TARTRATE 10 MG  TABS 1/2 or 1 by mouth at hs prn   Dosage confirmed as above?Dosage Confirmed   Supply Requested: 30   Last Refilled: 11/05/2010  Method Requested: Telephone to Pharmacy Next Appointment Scheduled: 02-14-11 Initial call taken by: Lanier Prude, [facility]),  December 11, 2010 9:08 AM  Follow-up for Phone Call        ok 3 ref Follow-up by: Tresa Garter MD,  December 11, 2010 6:25 PM  Additional Follow-up for Phone Call Additional follow up Details #1::        Rx called to pharmacy Additional Follow-up by: Lanier Prude, [facility]),  December 12, 2010 10:02 AM    Prescriptions: ZOLPIDEM TARTRATE 10 MG  TABS (ZOLPIDEM TARTRATE) 1/2 or 1 by mouth at hs prn  #30 x 2   Entered by:   Lanier Prude, [facility])   Authorized by:   Tresa Garter MD   Signed by:   Lanier Prude, CMA(AAMA) on 12/12/2010   Method used:   Telephoned to ...       [facility] [phone]* (retail)       [address] [place], [place]  [address]       Ph: [phone]       Fax: [phone]   RxID:   [id number]

## 2010-12-20 NOTE — Progress Notes (Signed)
  Phone Note Other Incoming Message from:  Fax from Pharmacy on November 14, 2010 11:56 AM  Caller: Wife Summary of Call: Pt wife would like for you to test for pts testosterone level. his appt is tomorrow. Initial call taken by: Ami Bullins CMA,  November 14, 2010 11:57 AM  Follow-up for Phone Call        noted Thank you!  Follow-up by: Tresa Garter MD,  November 14, 2010 1:21 PM

## 2011-01-31 ENCOUNTER — Telehealth: Payer: Self-pay | Admitting: Internal Medicine

## 2011-02-04 ENCOUNTER — Other Ambulatory Visit: Payer: Self-pay

## 2011-02-04 ENCOUNTER — Encounter (INDEPENDENT_AMBULATORY_CARE_PROVIDER_SITE_OTHER): Payer: Self-pay | Admitting: *Deleted

## 2011-02-04 ENCOUNTER — Other Ambulatory Visit: Payer: Self-pay | Admitting: Internal Medicine

## 2011-02-04 DIAGNOSIS — N320 Bladder-neck obstruction: Secondary | ICD-10-CM

## 2011-02-04 LAB — BASIC METABOLIC PANEL
BUN: 11 mg/dL (ref 6–23)
CO2: 27 mEq/L (ref 19–32)
GFR: 81.59 mL/min (ref >60.00)
Glucose, Bld: 97 mg/dL (ref 70–99)
Potassium: 4 mEq/L (ref 3.5–5.1)

## 2011-02-05 NOTE — Progress Notes (Signed)
Summary: med inquiry  Phone Note Other Incoming   Caller: Patient's wife,  Orlie Pollen [phone] Summary of Call: *Pt's wife states that Pt is having bloodwork drawn on Monday and that she had written a letter to you a month ago about concerns w/Pt's health.  *Caller states that she asked about testosterone test and wanted to make sure that it was included in labs ordered.  *Caller also states that she asked about Dementia testing that has not been discussed w/Pt and she wanted to get status on this matter.  Initial call taken by: Burnard Leigh [facility]),  January 31, 2011 5:09 PM  Follow-up for Phone Call        spoke w/caller and informed that testosterone labs are included in order and that the Dementia testing will be integrated into OV as somewhat of a regular test for Pt's age group to be discreet. Caller thanked [place] for the information. Follow-up by: Burnard Leigh [facility]),  January 31, 2011 5:11 PM

## 2011-02-14 ENCOUNTER — Encounter: Payer: Self-pay | Admitting: Internal Medicine

## 2011-02-14 ENCOUNTER — Ambulatory Visit (INDEPENDENT_AMBULATORY_CARE_PROVIDER_SITE_OTHER): Payer: Medicare Other | Admitting: Internal Medicine

## 2011-02-14 DIAGNOSIS — B1710 Acute hepatitis C without hepatic coma: Secondary | ICD-10-CM

## 2011-02-14 DIAGNOSIS — R5383 Other fatigue: Secondary | ICD-10-CM

## 2011-02-14 DIAGNOSIS — R5381 Other malaise: Secondary | ICD-10-CM

## 2011-02-14 DIAGNOSIS — Q210 Ventricular septal defect: Secondary | ICD-10-CM

## 2011-02-14 DIAGNOSIS — J309 Allergic rhinitis, unspecified: Secondary | ICD-10-CM

## 2011-02-14 DIAGNOSIS — E785 Hyperlipidemia, unspecified: Secondary | ICD-10-CM

## 2011-02-14 DIAGNOSIS — M8580 Other specified disorders of bone density and structure, unspecified site: Secondary | ICD-10-CM

## 2011-02-14 DIAGNOSIS — R030 Elevated blood-pressure reading, without diagnosis of hypertension: Secondary | ICD-10-CM

## 2011-02-14 DIAGNOSIS — F411 Generalized anxiety disorder: Secondary | ICD-10-CM

## 2011-02-14 DIAGNOSIS — N400 Enlarged prostate without lower urinary tract symptoms: Secondary | ICD-10-CM

## 2011-02-14 DIAGNOSIS — M949 Disorder of cartilage, unspecified: Secondary | ICD-10-CM

## 2011-02-14 DIAGNOSIS — G4700 Insomnia, unspecified: Secondary | ICD-10-CM

## 2011-02-14 NOTE — Assessment & Plan Note (Signed)
He will f/u with Dr Jens Som

## 2011-02-14 NOTE — Assessment & Plan Note (Signed)
F/u with GI  

## 2011-02-14 NOTE — Progress Notes (Signed)
  Subjective:    Patient ID: Roger Kent, male    DOB: 07-23-1946, 65 y.o.   MRN: [id number]  HPI  The patient presents for a follow-up of  chronic hypertension, chronic dyslipidemia, VSD, anxiety controlled with medicines    Review of Systems  Constitutional: Negative for appetite change.  HENT: Negative for congestion.   Respiratory: Negative for apnea, cough, choking, chest tightness, shortness of breath, wheezing and stridor.   Cardiovascular: Negative for chest pain, palpitations and leg swelling.  Genitourinary: Positive for urgency.  Psychiatric/Behavioral: Positive for dysphoric mood.       Objective:   Physical Exam  Constitutional: He appears well-developed and well-nourished. No distress.  HENT:  Head: Normocephalic.  Mouth/Throat: No oropharyngeal exudate.  Eyes: Left eye exhibits no discharge.  Neck: No JVD present. No thyromegaly present.  Cardiovascular: Regular rhythm.  Exam reveals no gallop and no friction rub.   Murmur (2-3/6) heard. Abdominal: He exhibits no distension. There is no tenderness.  Musculoskeletal: He exhibits no edema.  Skin: No rash noted. No erythema.  Psychiatric: He has a normal mood and affect. Judgment and thought content normal. His mood appears not anxious. His affect is not angry and not inappropriate. Tangential: talkative. He is not agitated and not aggressive. Thought content is not paranoid. Cognition and memory are normal. He does not exhibit a depressed mood. He expresses no homicidal and no suicidal ideation. He is attentive.          Assessment & Plan:  ALLERGIC RHINITIS On Rx  BENIGN PROSTATIC HYPERTROPHY On Rapaflo  ELEVATED BLOOD PRESSURE On Rx - doing well  HEPATITIS C F/u with GI  VSD He will f/u with Dr Jens Som  INSOMNIA Doing better  HYPERLIPIDEMIA Recheck labs  ANXIETY Discussed. He is not interested in meds or councelling. He expressed no anger issues.

## 2011-02-14 NOTE — Assessment & Plan Note (Signed)
Discussed. He is not interested in meds or councelling. He expressed no anger issues.

## 2011-02-14 NOTE — Assessment & Plan Note (Signed)
On Rx - doing well 

## 2011-02-14 NOTE — Assessment & Plan Note (Signed)
On Rapaflo

## 2011-02-14 NOTE — Assessment & Plan Note (Signed)
Recheck labs 

## 2011-02-14 NOTE — Assessment & Plan Note (Signed)
On Rx 

## 2011-02-14 NOTE — Assessment & Plan Note (Signed)
Doing better.   

## 2011-03-11 ENCOUNTER — Telehealth: Payer: Self-pay | Admitting: *Deleted

## 2011-03-11 NOTE — Telephone Encounter (Signed)
rec rf req for Zolpidem 10mg  1/2-1 po qhs prn. # 30... Last filled 02/11/11

## 2011-03-11 NOTE — Telephone Encounter (Signed)
OK to fill this prescription with additional refills x5 Thank you!  

## 2011-03-13 MED ORDER — ZOLPIDEM TARTRATE 10 MG PO TABS: 5.0000 mg | ORAL_TABLET | Freq: Every evening | ORAL | Status: DC | PRN

## 2011-03-15 ENCOUNTER — Ambulatory Visit (INDEPENDENT_AMBULATORY_CARE_PROVIDER_SITE_OTHER): Payer: Medicare Other | Admitting: Cardiology

## 2011-03-15 ENCOUNTER — Encounter: Payer: Self-pay | Admitting: Cardiology

## 2011-03-15 DIAGNOSIS — E785 Hyperlipidemia, unspecified: Secondary | ICD-10-CM

## 2011-03-15 DIAGNOSIS — Q210 Ventricular septal defect: Secondary | ICD-10-CM

## 2011-03-15 DIAGNOSIS — I10 Essential (primary) hypertension: Secondary | ICD-10-CM

## 2011-03-15 NOTE — Progress Notes (Signed)
HPI: Roger Kent is a pleasant gentleman with past medical history of congenital VSD, and prior DVT; I last saw him 12/10.  Last echo was performed in Dec 2010. The LV function was normal. There was a small perimembranous ventricular septal defect. There was mild aortic insufficiency. Left atrium is mildly dilated. Note the right atrium and right ventricle are normal. Also note a previous nuclear study performed in January of 2003 showed an ejection fraction of 50% and no ischemia or infarction. Since I last saw him the patient denies any dyspnea on exertion, orthopnea, PND, pedal edema, palpitations, syncope or chest pain. He does have some pain in his left shoulder but this improves with changing positions. It increases with movements.   Current Outpatient Prescriptions  Medication Sig Dispense Refill  . aspirin 81 MG EC tablet Take 81 mg by mouth as needed.       . Bromfenac Sodium (BROMDAY) 0.09 % (DAILY) SOLN Apply to eye as directed.        . cetirizine (ZYRTEC) 10 MG tablet Take 10 mg by mouth daily.        Marland Kitchen EPINEPHrine (EPIPEN 2-PAK) 0.3 mg/0.3 mL DEVI Inject 0.3 mg into the muscle as directed.        . Multiple Vitamins-Minerals (DAILY [employer] FORMULA PO) [employer] respiratory formula 1 tab po qd       . niacin 250 MG tablet Take 250 mg by mouth daily.       Marland Kitchen olmesartan (BENICAR) 20 MG tablet Take 20 mg by mouth daily.        Maxwell Caul Bicarbonate (ZEGERID PO) 1 tab po qd       . prednisoLONE acetate (PRED FORTE) 1 % ophthalmic suspension 1 drop daily.        . Silodosin (RAPAFLO) 8 MG CAPS Take 1 capsule by mouth 2 (two) times daily.       Marland Kitchen zolpidem (AMBIEN) 10 MG tablet Take 0.5-1 tablets (5-10 mg total) by mouth at bedtime as needed.  30 tablet  5  . DISCONTD: Omeprazole-Sodium Bicarbonate (ZEGERID) 20-1680 MG PACK Take by mouth daily.           Past Medical History  Diagnosis Date  . Hyperlipidemia   . BPH (benign prostatic hyperplasia)   . Anxiety  state, unspecified   . Hypertonicity of bladder   . Unspecified tinnitus   . Personal history of urinary calculi   . Esophageal reflux   . Acute hepatitis C without mention of hepatic coma   . Osteoporosis, unspecified   . Barrett's esophagus   . Hiatal hernia   . Insomnia   . DVT (deep venous thrombosis)   . Allergic rhinitis, cause unspecified   . Recent retinal detachment, partial, with giant tear   . Ventricular septal defect   . Elevated PSA     Dr Vernie Ammons  . Hypertension     Past Surgical History  Procedure Date  . Retinal detachment surgery   . Knee surgery     History   Social History  . Marital Status: Married    Spouse Name: N/A    Number of Children: N/A  . Years of Education: N/A   Occupational History  . Not on file.   Social History Main Topics  . Smoking status: Never Smoker   . Smokeless tobacco: Not on file  . Alcohol Use: Not on file  . Drug Use: Not on file  . Sexually Active: Not on file  Other Topics Concern  . 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 Remaining systems are negative.  Physical Exam: Well-developed well-nourished in no acute distress.  Skin is warm and dry.  HEENT is normal.  Neck is supple. No thyromegaly.  Chest is clear to auscultation with normal expansion.  Cardiovascular exam is regular rate and rhythm. 2/6 systolic murmur left sternal border. Abdominal exam nontender or distended. No masses palpated. Extremities show no edema. neuro grossly intact  ECG Normal sinus rhythm at a rate of 70. No ST changes.

## 2011-03-15 NOTE — Assessment & Plan Note (Signed)
Management per primary care. 

## 2011-03-15 NOTE — Assessment & Plan Note (Signed)
Blood pressure controlled with present medications. Will continue. 

## 2011-03-15 NOTE — Patient Instructions (Signed)
Your physician has requested that you have an echocardiogram. Echocardiography is a painless test that uses sound waves to create images of your heart. It provides your doctor with information about the size and shape of your heart and how well your heart's chambers and valves are working. This procedure takes approximately one hour. There are no restrictions for this procedure.  Your physician recommends that you continue on your current medications as directed. Please refer to the Current Medication list given to you today.  Your physician wants you to follow-up in:  1  Year. You will receive a reminder letter in the mail two months in advance. If you don't receive a letter, please call our office to schedule the follow-up appointment.

## 2011-03-15 NOTE — Assessment & Plan Note (Signed)
Ventricular septal defect is small. Previous echocardiogram showed normal right atrial and right ventricular size. Plan repeat echocardiogram and continue observation if unchanged - normal right atrial and right ventricular size and normal pulmonary pressures. Continue SBE prophylaxis.

## 2011-03-19 ENCOUNTER — Ambulatory Visit (HOSPITAL_COMMUNITY): Payer: Medicare Other | Attending: Internal Medicine

## 2011-03-19 DIAGNOSIS — I10 Essential (primary) hypertension: Secondary | ICD-10-CM

## 2011-03-19 DIAGNOSIS — I510 Cardiac septal defect, acquired: Secondary | ICD-10-CM

## 2011-03-19 DIAGNOSIS — Q210 Ventricular septal defect: Secondary | ICD-10-CM

## 2011-04-02 NOTE — Assessment & Plan Note (Signed)
[facility]                           PRIMARY CARE OFFICE NOTE   Roger Kent, Roger Kent                   MRN:          [id number]  DATE:04/30/2007                            DOB:          [birth year]    The patient is a 65 year old male who presents for a wellness  examination.   ALLERGIES:  None.   Past medical history, family history, social history as per April 23, 2006. He was diagnosed with a detached retina. His mother just died at  age of 22 of old age.   CURRENT MEDICATIONS:  Reviewed with the patient in detail.   REVIEW OF SYSTEMS:  No chest pain or shortness of breath. Denies being  depressed. Occasional problems with prostate. Occasional insomnia. The  rest of the 18-point review of systems is negative.   PHYSICAL EXAMINATION:  VITAL SIGNS:  Blood pressure 121/73, pulse 73,  temperature 97.6, weight 183 pounds.  GENERAL:  Looks well.  HEENT:  Moist mucosa.  NECK:  Supple, no thyromegaly, no bruit.  LUNGS:  Clear to auscultation and percussion. No wheezes or rales.  HEART:  S1, S2, no murmur, no gallop.  ABDOMEN:  Soft, nontender, no organomegaly, no mass felt.  EXTREMITIES:  Lower extremities without edema.  NEUROLOGIC:  He is alert, oriented and cooperative. Denies being  depressed.  RECTAL:  Just done by Dr. Vernie Ammons about a month ago.   LABORATORY DATA:  April 24, 2007, CBC normal, CMET normal, A1c 5.8,  cholesterol 139, TSH 1.57. EKG with normal sinus rhythm. PSA 10.38,  urinalysis normal.   ASSESSMENT/PLAN:  1. Normal wellness examination. Age/health-related issues discussed.      Healthy lifestyle discussed. A regular colonoscopy with Dr. Kinnie Scales,      a regular prostate exam with Dr. Vernie Ammons. I will see him back in 12      months.  2. Elevated PSA, higher than it was. Just took Cipro for the episode      of prostatitis. He is wondering if it is okay to take [facility],      I said it is okay. He will followup with Dr.  Vernie Ammons.  3. Prostatitis status post recent course of Cipro.  4. Insomnia. We will change to Ambien 10 mg daily from Ambien CR.     Georgina Quint. Plotnikov, MD  Electronically Signed    AVP/MedQ  DD: 05/02/2007  DT: 05/03/2007  Job #: [id number]   cc:   Veverly Fells. Vernie Ammons, M.D.

## 2011-04-02 NOTE — H&P (Signed)
NAME:  Roger Kent, Roger Kent NO.:  [id number]   MEDICAL RECORD NO.:  [id number]          PATIENT TYPE:  EMS   LOCATION:  MAJO                         FACILITY:  [facility]   PHYSICIAN:  Willow Ora, MD           DATE OF BIRTH:  August 13, 1946   DATE OF ADMISSION:  08/21/2007  DATE OF DISCHARGE:                              HISTORY & PHYSICAL   CHIEF COMPLAINT:  DVT.   HISTORY OF PRESENT ILLNESS:  Mr. Wentzell is a 64 year old gentleman  who presented to his primary care doctor's office yesterday with 1-week  history of left leg swelling.  There was no pain.  They ordered an  ultrasound that was completed this afternoon, and it showed extensive  DVT of the left leg.  Consequently, he was referred to the ER for an  admission.  In general he has been doing well.  He was diagnosed with a  detached retina and operated on on July 15, 2007.  He has been resting  at home since then.  Additionally, they drove to [facility] last week back  and forth, making 6 hours going there and 5 hours coming back.   PAST MEDICAL HISTORY:  1. Surgery for left detached retina on July 15, 2007.  2. History of prostatitis.  3. Status post bilateral knee arthroscopy and left knee surgery.  4. History of a hernia repair.  5. History of hepatitis C status post treatment by Dr. Kinnie Scales.  His      levels of virus have been undetectable for the last 6 years.   FAMILY HISTORY:  He does remember that his mother had blood clots.  Other than that, it is unremarkable.   SOCIAL HISTORY:  He does not smoke or drink.  He is married.  He works  at the [employer].   REVIEW OF SYSTEMS:  Denies any chest pain, no nausea or vomiting.  No  shortness of breath.  No blood in the stools or dysuria.  No recent  airplane trips.   MEDICATIONS:  1. He was taking an aspirin a day up until the surgery for the retina.  2. He takes a multivitamin daily.  3. He has started saw palmetto for his prostate 3 weeks ago.  4. He has  been taking glucosamine chondroitin for years.   ALLERGIES:  NO KNOWN DRUG ALLERGIES.   PHYSICAL EXAMINATION:  GENERAL:  The patient is alert, oriented, and in  no apparent distress.  VITAL SIGNS:  He is afebrile, blood pressure 138/88, pulse 77,  respiration 24.  HEENT:  Not pale, nonicteric, and in no apparent distress.  LUNGS:  Clear to auscultation bilaterally.  CARDIOVASCULAR:  Regular rate and rhythm with a faint systolic murmur.  ABDOMEN:  Nondistended, soft, good bowel sounds.  No organomegaly.  EXTREMITIES:  Color is normal, bilateral pedal pulses are normal.  The  left calf is about 2 inches larger than the right.  There is no  tenderness to palpation.   LABORATORY AND X-RAYS:  Potassium 4.1.  PT and PTT baseline are normal.  Hemoglobin 14.3, platelets 214, and white  count 8.4.   ASSESSMENT AND PLAN:  1. The patient is admitted with an acute deep venous thrombosis of the      left leg which seems to be extensive.  No signs or symptoms of      pulmonary embolism.  He will be admitted and started on Lovenox and      Coumadin.  The risk factors for his deep venous thrombosis include      recent rest at home after his eye surgery and a recent drive to      [place].  2. He has a history of heart murmur since birth.  3. Multiple questions answered.   TIME SPENT WITH THE PATIENT:  In excess of 25 minutes.      Willow Ora, MD  Electronically Signed     JP/MEDQ  D:  08/21/2007  T:  08/23/2007  Job:  [id number]

## 2011-04-02 NOTE — Assessment & Plan Note (Signed)
[facility]                            CARDIOLOGY OFFICE NOTE   JAE, SKEET                   MRN:          [id number]  DATE:10/27/2007                            DOB:          07/17/1946    [name] is a 65 year old male with past medical history of  congenital VSD whom I am asked to evaluate for Coumadin use and a recent  DVT.  The patient has been seen in this office previously.  He has  congenital VSD.  An echocardiogram on July 27, 2004 showed normal  left ventricular function.  There was a small perimembranous VSD and  there was mild aortic insufficiency.  Note, his right atrium and right  ventricle were normal.  The patient was recently admitted to [facility] by a primary care service and diagnosed with IV venous  thrombosis in his left lower extremity.  He had been to [place] in  traveling and also had a recent surgery due to left retinal detachment.  He has been placed on Coumadin and is now in our Coumadin clinic.  Cardiology is asked to evaluate.  Note, he denies any dyspnea, chest  pain, palpitations, or syncope.  There is no pedal edema.   MEDICATIONS:  1. Glucosamine chondroitin  2. Coumadin as directed  3. Ambien 10 mg p.o. q.h.s. p.r.n.  4. Fish oil, calcium prostate, men's multivitamin.   ALLERGIES:  AUGMENTIN.   PAST MEDICAL HISTORY:  There is no diabetes mellitus, hypertension, but  there is a history of mild hyperlipidemia.  He has a history of  hepatitis C.  He also has a history of benign prostatic hypertrophy.  He  has a congenital perimembranous VSD.  He also had arthroscopic surgery  on both knees.  He has a history of gastroesophageal reflux disease as  well.  He has a history of a retinal detachment.   SOCIAL HISTORY:  He does not smoke nor does he consume alcohol.  He does  have a remote history of drug use.   FAMILY HISTORY:  Negative for coronary artery disease in his immediate  family.   REVIEW OF SYSTEMS:  There is no headaches or fevers, chills.  There is  no cough or hemoptysis.  There is no dysphagia or odynophagia, melena,  hematochezia.  There is no dysuria or hematuria.  There is no orthopnea,  PND, or pedal edema.  There is no claudication.  The remainder of the  systems are negative.   PHYSICAL EXAMINATION:  Shows a blood pressure of 138/89.  His pulse is  58.  He weighs 193 pounds.  He is well-nourished, well-developed, in no  acute distress.  His skin is warm and dry.  He does not appear depressed  and there is no peripheral clubbing.  His back is normal.  HEENT:  Normal, with normal eyes.  NECK:  Supple, with a normal upstroke bilaterally and no bruits noted.  There is no jugular venous distention or thyromegaly noted.  CHEST:  Clear to auscultation, normal expansion.  CARDIOVASCULAR:  Reveals a regular rate and rhythm with a normal S1, S2.  There is a harsh 2-3/6 systolic murmur at the left sternal border.  There is no S3 or S4.  There is no right ventricular heave.  ABDOMEN:  Not tender or distended.  Positive bowel sounds noted.  No  hepatosplenomegaly, and no mass appreciated.  There is no abdominal  bruit.  EXTREMITIES:  He has 2+ femoral pulses bilaterally, with no bruits.  His  extremities show no edema, and I can palpate no cords.  He has 2+  dorsalis pedis pulse on the right and 1+ on the left.  NEUROLOGIC:  Grossly intact.   EKG shows a sinus rhythm at a rate of 58.  There are no significant ST  changes.   DIAGNOSES:  1. Recent deep vein thrombosis -- the patient will continue to be      followed in our Coumadin clinic with a goal INR of 2-3.  He is      scheduled to see Dr. Posey Rea later this afternoon and Dr.      Posey Rea will manage the duration of this Coumadin for his recent      deep vein thrombosis.  2. History of ventricular septal defect -- we will plan to repeat his      echocardiogram to reassess his LV and RV  function.  He is not      having any symptoms and this has been small in the past and      therefore requires most likely only medical therapy.  Note, he has      been instructed on the reports of SB prophylaxis given his      congenital heart disease.  3. History of mild hyperlipidemia -- per Dr. Posey Rea.  4. History of benign prostatic hypertrophy.   We will see him back in approximately 2 years.     Madolyn Frieze Jens Som, MD, [facility]  Electronically Signed    BSC/MedQ  DD: 10/27/2007  DT: 10/27/2007  Job #: [id number]   cc:   Georgina Quint. Plotnikov, MD

## 2011-04-02 NOTE — Discharge Summary (Signed)
NAME:  Roger Kent, WOLMAN NO.:  [id number]   MEDICAL RECORD NO.:  [id number]          PATIENT TYPE:  INP   LOCATION:  5729                         FACILITY:  [facility]   PHYSICIAN:  Willow Ora, MD           DATE OF BIRTH:  Mar 16, 1946   DATE OF ADMISSION:  08/21/2007  DATE OF DISCHARGE:  08/23/2007                               DISCHARGE SUMMARY   BRIEF HISTORY AND PHYSICAL:  [name] is a 65 year old gentleman  who presented to the ER after ultrasound of his leg showed DVT on the  left leg.  Prior to that, he was having swelling of the leg for 1 week.  He denied chest pain, nausea, and shortness of breath.  He has been very  inactive lately because on July 15, 2007 he was operated on his left  eye for a retinal detachment.  He also drove to [facility] back and forth  week.   PHYSICAL EXAMINATION:  GENERAL:  The patient is alert, oriented, in no  apparent distress.  VITAL SIGNS:  Blood pressure 138/88, pulse 77, respiratory rate 24.  LUNGS:  Clear to auscultation bilaterally.  CARDIOVASCULAR:  Regular rate and rhythm. with a soft 2/6 heart murmur.  ABDOMEN:  Normal.  EXTREMITIES:  Legs:  He has good pedal pulses bilaterally.  The left  calf is 2 inches larger than the right calf.  The calf is soft and  nontender.   LABORATORY AND X-RAYS:  Initial white count was 8.4, with a hemoglobin  of 14.3, and platelets of 214.  Creatinine was 0.9, potassium 4.1.  Initial PT and PTT were normal.  Hypercoagulation panel is pending.   HOSPITAL COURSE:  The patient was admitted to the floor and started on  Lovenox as well as Coumadin.  His hospital stay was unremarkable.  He  was extensively educated about Coumadin and Lovenox, and he feels  comfortable self-injecting Lovenox twice a day.  At the time of  discharge, he continued to be asymptomatic, without chest pain or  shortness of breath.  His legs was noticed to be less swollen.   DISCHARGE INSTRUCTIONS:  1. Coumadin 5 mg 1  tablet daily.  2. Lovenox 90 mg twice a day.  3. Elevate the left leg.  4. I advised him to walk-in into Dr. Leta Jungling office in 2 days for INR      check.  By doing so, I will be sure that he got his Coumadin      checked.  After that, I will talk with Dr. Posey Rea and let him      keep checking the Coumadin.  5. He is advised to go to the emergency room if he develops chest      pain, shortness of breath, or gets more leg swelling.  6. He was extensively educated about a Coumadin diet that he needs to      follow.   ADMITTING DIAGNOSIS:  Left leg deep venous thrombosis.   DISCHARGE DIAGNOSIS:  Left leg deep venous thrombosis.      Willow Ora, MD  Electronically Signed  JP/MEDQ  D:  08/23/2007  T:  08/24/2007  Job:  [id number]

## 2011-04-05 NOTE — Discharge Summary (Signed)
   NAME:  Roger Kent, Roger Kent                      ACCOUNT NO.:  [id number]   MEDICAL RECORD NO.:  [id number]                   PATIENT TYPE:  INP   LOCATION:  0379                                 FACILITY:  [facility]   PHYSICIAN:  Sean A. Everardo All, M.D. [facility]           DATE OF BIRTH:  Dec 15, 1945   DATE OF ADMISSION:  08/30/2003  DATE OF DISCHARGE:  09/01/2003                                 DISCHARGE SUMMARY   DIAGNOSES AT THE TIME OF DISCHARGE:  1. Urosepsis, resolved.  2. Hyperglycemia.  3. Hypokalemia.  4. Mild anemia noted this hospitalization.   REASON FOR ADMISSION:  Urosepsis.   HISTORY OF PRESENT ILLNESS:  The patient is a 65 year old man, admitted by  Bruce H. Swords, M.D. on August 30, 2003, with urosepsis.  Please refer to  his dictated history and physical for details.   HOSPITAL COURSE:  The patient was admitted and treated with antibiotics and  symptomatic therapy against his nausea as well as intravenous fluids.  He  significantly improved during his hospitalization.   His low potassium was presumed due to his vomiting, and this was  successfully supplemented.   He also had hyperglycemia during his admission, as has been noted recently  in the outpatient setting, and diet and exercise are advised for this.   By September 01, 2003, the patient was alert, oriented, eating his usual diet  and ambulatory and was thus discharged home.  He was seen by Loraine Leriche C.  Vernie Ammons, M.D. on August 31, 2003, and he recommended finishing the  antibiotics, Flomax, and follow up with him.   FOLLOW UP:  1. Mark C. Vernie Ammons, M.D. within a week.  2. Georgina Quint. Plotnikov, M.D. within two weeks.   FOCUS FOLLOW-UP:  1. Anemia will need to be rechecked and followed up as necessary.  2. Hyperglycemia needs follow-up.  3. No restriction on diet or activity.   MEDICATIONS AT THE TIME OF DISCHARGE:  1. Cipro 500 mg b.i.d. x 2 weeks total.  2. Flomax 0.4 mg daily.  3.     Arthrotec 75 mg  b.i.d.  4. Miacalcin 200 units intranasal daily.  5. Protonix 40 mg daily.                                               Sean A. Everardo All, M.D. [facility]    SAE/MEDQ  D:  09/01/2003  T:  09/01/2003  Job:  [id number]   cc:   Georgina Quint. Plotnikov, M.D. [facility] C. Vernie Ammons, M.D.  [address]  [place]  [place] [address]  Fax: [phone]

## 2011-04-05 NOTE — Consult Note (Signed)
NAME:  Roger Kent, Roger Kent                      ACCOUNT NO.:  [id number]   MEDICAL RECORD NO.:  [id number]                   PATIENT TYPE:  INP   LOCATION:  0379                                 FACILITY:  [facility]   PHYSICIAN:  Mark C. Vernie Ammons, M.D.               DATE OF BIRTH:  [birth year]   DATE OF CONSULTATION:  08/31/2003  DATE OF DISCHARGE:                                   CONSULTATION   REASON FOR CONSULTATION:  The patient is a 65 year old white male known to  me.  He was admitted with fever, but minimally obstructive voiding symptoms.  His temperature went up to 103, and he experienced chills, some urinary  frequency, mild dysuria with associated nausea and decreased appetite.  He  had myalgias and arthralgias with flu-like symptoms, but had no flank pain,  saw no gross hematuria, and had no severe dysuria or urethral discharge.  He  has known BPH, but no prior history of UTI or prostatitis.   PAST MEDICAL HISTORY:  1. BPH.  2. Hepatitis C.   PAST SURGICAL HISTORY:  None.   MEDICATIONS UPON ADMISSION:  1. OTC Prilosec.  2. Flomax.   SOCIAL HISTORY:  He is married and does not drink or smoker.   FAMILY HISTORY:  Negative for GU malignancy or renal disease.   REVIEW OF SYSTEMS:  Reveals no flank pain.  He has no urethral discharge,  hematuria, or dysuria at this time.  No abdominal discomfort.  He has normal  bowel movements with no diarrhea or constipation.   PHYSICAL EXAMINATION:  VITAL SIGNS:  His blood pressure is 91/60, pulse 76,  temperature 100.4.  GENERAL:  The patient is a well-developed, well-nourished white male in no  apparent distress.  HEENT:  Normocephalic, atraumatic.  Oropharynx is clear.  NECK:  Supple.  CHEST:  Clear.  CARDIOVASCULAR:  Regular rate and rhythm.  ABDOMEN:  Soft and nontender without mass or HSM.  He had no CVAT to  percussion.  GENITOURINARY:  Normal male phallus and normal glans and meatus.  Scrotum  and testicles are normal  without swelling or tenderness.  The epididymis is  palpably normal.  He has normal anus, perineum, and normal rectal tone.  His  prostate is 2+ enlarged, but smooth and symmetric, has no suspicious  nodularity or induration and is not tender nor is there fluctuance or any  induration.  No seminal vesicle abnormality noted.  EXTREMITIES:  Without cyanosis, clubbing, or edema.  NEUROLOGIC:  There are no gross focal neurologic deficits.   LABORATORY RESULTS:  His creatinine was 1.  White count on admission was  9.6, today it is decreased to 16.  Urine culture is currently growing E.  coli, greater then 10 to the fifth with sensitivities pending.  Blood  culture is negative.   IMPRESSION:  1. Benign prostatic hypertrophy with bladder outlet obstruction, managed     with Flomax.  2.  Febrile urinary tract infection.   It could have been early pyelonephritis, but he had no costovertebral angle  tenderness that would suggest that or he may of had an early acute  prostatitis, but again no dysuria and his prostate is nontender.   PLAN:  1. Continue antibiotics and adjust per sensitivities.  2. Discharge home when afebrile on two weeks of oral antibiotics.  3. The patient will follow up with me as an outpatient.                                                 Mark C. Vernie Ammons, M.D.    MCO/MEDQ  D:  08/31/2003  T:  08/31/2003  Job:  [id number]   cc:   Georgina Quint. Plotnikov, M.D. [facility]

## 2011-04-05 NOTE — Procedures (Signed)
[facility]  Patient:    Roger Kent, Roger Kent                   MRN: [id number] Proc. Date: 04/22/00 Adm. Date:  [id number] Disc. Date: [id number] Attending:  Deneen Harts CC:         Sonda Primes, M.D. [facility]                           Procedure Report  PROCEDURE PERFORMED:  Panendoscopy with biopsy.  ENDOSCOPIST:  Griffith Citron, M.D.  INDICATIONS FOR PROCEDURE:  The patient is a 65 year old white male endoscoped June of 1999 with finding of short segment Barretts esophagus.  Maintained on Prevacid 30 mg daily.  Asymptomatic.  Undergoing repeat examination for neoplasia surveillance.  DESCRIPTION OF PROCEDURE:  After reviewing the nature of the procedure with the patient including potential risks and complications and after discussing alternative methods of diagnosis and treatment, informed consent was signed.  Patient premedicated with topical anesthetic followed by IV sedation totalling Versed 4 mg, fentanyl 50 mcg administered in divided doses prior to the onset of the procedure.  Using the Olympus video endoscope, the proximal esophagus was intubated under direct vision.  Normal oropharynx without lesion of the epiglottis, vocal cords or piriform sinuses.  Proximal and midesophagus normal.  Distal esophagus notable for small patches of metaplastic appearing mucosa in small islands, three distinct, each approximately 2 to 3 mm in diameter.  Distinct improvement compared to two years ago where there was a confluent tongue of metaplastic tissue stretching from the mucosal Z-line.  This was extensively biopsies.  Mucosal Z-line was distinct to 40 cm.  Moderate hiatal hernia from 40 to 44 cm, not inflamed.  Gastric fundus, body and antrum normal.  Pylorus symmetric. Duodenal bulb and second portion normal.  Retroflex view of the angularis, lesser curve, gastric cardia and fundus negative.  Stomach decompressed following esophageal biopsy. The  patient tolerated the procedure without difficulty being maintained on DataScope monitor and low-flow oxygen throughout.  ASSESSMENT: 1. Barretts metaplasia, improved appearance.  Now with small residual    islands of columnar epithelium.  Biopsy obtained. 2. Hiatal hernia, moderate, noninflamed.  RECOMMENDATIONS: 1. Antireflux measures. 2. Continue Prevacid 30 mg daily. 3. Repeat endoscopy in two years if Barretts metaplasia again seen on    biopsy.  If there is no evidence of metaplasia then there is no indication    for surveillance biopsy, given this minimal lesion. DD:  04/22/00 TD:  04/24/00 Job: [id number]

## 2011-04-05 NOTE — H&P (Signed)
NAME:  Roger Kent, Roger Kent                      ACCOUNT NO.:  [id number]   MEDICAL RECORD NO.:  [id number]                   PATIENT TYPE:  EMS   LOCATION:  ED                                   FACILITY:  [facility]   PHYSICIAN:  Valetta Mole. Swords, M.D. [facility]           DATE OF BIRTH:  [birth year]   DATE OF ADMISSION:  08/30/2003  DATE OF DISCHARGE:                                HISTORY & PHYSICAL   CHIEF COMPLAINT:  Fever and nausea.   HISTORY OF PRESENT ILLNESS:  Roger Kent is a 65 year old gentleman who  was in his usual state of good health until approximately 24 hours ago.  At  that time, he felt like he developed flu-like symptoms.  He describes a  diffuse aching sensation.  He then developed fever, chills, urinary  frequency and dysuria associated with nausea.  He has not had any emesis.  He did take his temperature at home and was 103 degrees.  He took some over-  the-counter analgesics with some relief of his fever and myalgias.  He does  have a history of benign prostatic hypertrophy but no history of previous  urinary tract infection or prostatitis.   PAST MEDICAL HISTORY:  His past medical history is significant for:  1. Hepatitis C, apparently treated and now apparently non-detectable levels     of hepatitis C.  2. Benign prostatic hypertrophy, followed by Dr. Loraine Leriche C. Ottelin.   SURGERIES:  None.   FAMILY HISTORY:  Noncontributory.   SOCIAL HISTORY:  He is married.  He works at the [employer], third shift.  He is a nonsmoker and does not drink alcohol.   MEDICATIONS:  He uses over-the-counter Prilosec and a small gray prostate  pill (prescribed by Dr. Vernie Ammons).   ALLERGIES:  He has no known drug allergies.   REVIEW OF SYSTEMS:  He denies any chest pain, shortness of breath, PND,  orthopnea, abdominal pain, lower extremity edema, rashes, neurologic  complaints or any other complaints in the review of systems.   PHYSICAL EXAMINATION:  VITAL SIGNS:  He has been  afebrile in the emergency  department.  Blood pressure has been as low as 82/40 and as high as 111/72.  GENERAL:  In general, he appears as a well-developed, well-nourished,  healthy male in no acute distress.  HEENT:  Atraumatic, normocephalic; extraocular muscles are intact.  NECK:  Neck is supple without lymphadenopathy, thyromegaly, jugular venous  distention or carotid bruits.  CHEST:  Chest is clear to auscultation without any increased work of  breathing.  CARDIAC:  S1 and S2 are normal without murmurs or gallops.  ABDOMEN:  Active bowel sounds; soft, nontender.  There is no  hepatosplenomegaly and no masses palpated.  EXTREMITIES:  There is no clubbing, cyanosis, or edema.  RECTAL:  I did not repeat a rectal exam, which was done by the emergency  room physician.  It demonstrates normal tone and tender prostate by  report.   LABORATORIES:  Urinalysis significant for turbid appearance, large  hemoglobin, trace ketones, 100 mg/dl of protein and large leukocyte  esterase, 7 to 10 red blood cells per high-power field, too-numerous-to-  count white blood cells.   CBC significant for a white count of 19.6, hemoglobin 14.8, platelets  168,000.  Neutrophils 86%.  Amylase 93.  CMET significant for a potassium of  3.4, calcium 8.2, total bilirubin 2.0, lipase normal at 45.   ASSESSMENT AND PLAN:  1. Genitourinary tract infection in a 65 year old man who presents with     fever and hypotension.  He could have urosepsis.  He could have     prostatitis with systemic symptoms.  Given his hypotension, I think he     needs to be further evaluated in the hospital.  He was given a dose of     Rocephin in the emergency department.  I will change that to oral (p.o.)     Cipro, as I think it will have better genitourinary tract coverage.  2. Hyperbilirubinemia.  It is unknown what his baseline is.  He does have a     history of hepatitis C and we will recheck that in the morning.  3. Hypokalemia.   We will replace with oral potassium and check in the     morning.  4. Leukocytosis.  We will follow up in the morning.                                               Roger Kent Swords, M.D. [facility]    BHS/MEDQ  D:  08/30/2003  T:  08/30/2003  Job:  [id number]   cc:   Georgina Quint. Plotnikov, M.D. [facility] C. Vernie Ammons, M.D.  [address]  [place]  [place] [address]  Fax: [phone]

## 2011-04-05 NOTE — Op Note (Signed)
NAME:  Roger Kent, Roger Kent            ACCOUNT NO.:  [id number]   MEDICAL RECORD NO.:  [id number]          PATIENT TYPE:  OIB   LOCATION:  2899                         FACILITY:  [facility]   PHYSICIAN:  Beulah Gandy. Ashley Royalty, M.D. DATE OF BIRTH:  January 30, 1946   DATE OF PROCEDURE:  01/22/2005  DATE OF DISCHARGE:                                 OPERATIVE REPORT   ADMISSION DIAGNOSIS:  Rhegmatogenous retinal detachment, vitreous  hemorrhage, recent pseudophakia in the right eye.   PROCEDURES:  Are scleral buckle, pars plana vitrectomy, gas-fluid exchange.  perfluoropropane injection, retinal photocoagulation, right eye.   SURGEON:  Beulah Gandy. Ashley Royalty, M.D.   ASSISTANT:  Bryan Lemma. Lundquist, P.A.   ANESTHESIA:  General.   DETAILS:  Usual prep and drape, 360 degree limbal peritomy, isolation of  four rectus muscles on 2-0 silk.  Localization of breaks at 11 and 12 o'clock.  Scleral dissection of 360  degrees to admit a number 279 intrascleral implant with a 240 band and 270  sleeve.  The diathermy was placed in the bed.  A 508G radial segment was  placed 11 o'clock beneath the breaks.  Perforation site chosen at 11 o'clock  in the anterior aspect of the bed.  The joint was made at 2 o'clock, 240  band 270 sleeve at 2 o'clock.  A large amount of clear, colorless subretinal  fluid came forth from the drainage site.  Sterile air was injected through  the pars plana.  Twenty-five gauge vitrectomy trocars were placed and the  instruments were placed.  Pars plana vitrectomy with removal of vitreous  hemorrhage and all vitreous was carried out.  The BIOM viewing system was  moved into place and Provisc was placed on the corneal surface.  Blood mixed with vitreous was encountered.  The retina was loose and floppy  above with several breaks.  The vitreous was trimmed down to the retinal  surface.  The vitrectomy is carried posteriorly down to the macular region,  were all vitreous was removed.  Once this was  accomplished, a gas-fluid  exchange was carried out and the retina came to lie nicely on the scleral  buckle with some posterior fluid remaining.  Perfluoropropane 12% was then  exchanged for intravitreal gas.  The 25-gauge trocars were removed from the  eye, tested and found to be tight.  The indirect ophthalmoscope laser was  moved into place, 386 burns were placed on the scleral buckle with a power  of 0.5 milliwatts each, 1000 microns in size each and 0.1 seconds each.  Some posterior balloons of fluid were seen, but the retina was lying nicely  on the buckle with the breaks well-supported.  The buckle was adjusted and  trimmed, the band was adjusted and trimmed.  The scleral flaps were closed  with 4-0 Mersilene suture and trimmed.  The conjunctiva was reposited with 7-  0 chromic suture.  Polymyxin and gentamicin were irrigated into Tenon's  space, Decadron 10 mg was injected to the lower subconjunctival space.  TobraDex ointment was placed as well as a patch and shield.  The closing  tension was between  15 and 20 with a Barraquer tonometer.   COMPLICATIONS:  None.   DURATION:  2-1/2 hours.   The patient was awakened and taken to recovery in a satisfactory condition.      JDM/MEDQ  D:  01/22/2005  T:  01/23/2005  Job:  [id number]

## 2011-04-05 NOTE — Op Note (Signed)
[place]  Patient:    Roger Kent, Roger Kent                   MRN: [id number] Proc. Date: 01/15/00 Adm. Date:  [id number] Attending:  Alvan Dame                           Operative Report  SURGEON:  Alvan Dame, D.P.M.  RESIDENT:  Alethia Berthold, CFA  PREOPERATIVE DIAGNOSES:  Hallux rigidis/degenerative arthritis - first MPJ left foot.  POSTOPERATIVE DIAGNOSIS:  Hallux rigidis/degenerative arthritis - first MPJ left foot.  OPERATIVE PROCEDURE:  Lorenz Coaster bunionectomy with implant arthroplasty, utilizing Bioaction implant.  INDICATIONS FOR SURGERY:  The patient has a several year history of complaints f pain, tenderness and enlargement of first MPJ of the left foot.  X-rays confirms an absence of joint space.  There is perotic spurring, flattening of the metatarsal head, and cystic changes within the metatarsal and phalangeal base of the left first MPJ.  There is, on clinical evaluation, minimal motion, less than 5 degrees of motion or mobility at the first MPJ area, with both pain and tenderness on attempted motion or on palpation over the area.  The patient had difficulty with ambulation, and at this time requests surgical intervention in hopes of relieving the problem on a permanent basis.  Having discussed the alternatives, at this time election for replacement arthroplasty is selected.  ANESTHESIA:  Managed anesthesia care, local anesthetic administered total of 10 cc 50/50 mixture 2% Xylocaine plain and 0.5% Marcaine plain.  This was applied in a male block fashion.  Additional 6 cc of 2% Xylocaine plain were utilized intraoperatively.  HEMOSTASIS:  Left ankle tourniquet at 250 mmHg.  FINDINGS/DESCRIPTION OF PROCEDURE:  The patient is brought to the OR and placed on the table in the supine position.  IV sedation was established.  The foot was then prepped and draped in the usual aseptic manner; local anesthetic  was administered as mentioned.  The left foot was then exsanguinated with Esmarch wrap and ankle  tourniquet inflated to 250 mmHg.  The following procedure was then carried out.  KELLER BUNIONECTOMY WITH IMPLANT ARTHROPLASTY -- FIRST MPJ LEFT.  Attention was  directed to the dorsum of the left foot overlying the first MPJ.  An approximately 6-7 cm linear incision was carried out just medial to and parallel along the long extensor tendon.  The incision was deepened via sharp and blunt dissection to the level of the capsular structures over the first MPJ.  At this time a linear capsular incision was carried out, the entire length of the original incision, providing exposure to the dorsum of the metatarsal and phalanx surfaces.  It should be noted that ______ ______ were hypertrophic bone and joint fragments located over the dorsum of the surface.  At least four fragments of bone and cartilage tissue were excised, ranging in size from less than 1 cm to 1.5 cm in diameter.  The joint was noted to be immobile on approach.  At this time the left joint was fully appreciated, and capsular ______ were reflected medially and laterally. he head of the first metatarsal was delivered into the surgical site, utilizing power instrumentation.  The hypertrophied dorsal, medial and lateral prominences were  resected flush with the shaft of the metatarsal.  At this time, utilizing power  instrumentation, a through-and-through osteotomy with resection of the capsular  fragment of the first metatarsal head  was carried out, perpendicular to its shaft. At this time attention was then directed to the base of the proximal phalanx, which was also shown to have severe arthritic changes with complete degeneration of the articular surface.  At this time a through-and-through osteotomy was carried out at the base, with resection of the base of the proximal phalanx being carried out perpendicular to its  shaft.  Upon completion of resection of the articular surfaces, preparation for the stems to be implanted were carried out in the usual fashion, utilizing the appropriate-sized reamers.  It was determined preoperatively and intraoperatively that a large neutral metatarsal component and a large modified phalangeal component would be appropriate.  At this time the sites were prepared with appropriate reaming, utilizing rotary bur and osteotomes.  Once the appropriate holes were placed in the metatarsal shaft and the phalangeal bases respectively, the sizers were put in place and noted to fit appropriately.  At his time the sizers were removed and the actual implants were impacted into the metatarsal shaft and phalangeal base respectively. It should be noted that there was adequate range of motion, both in dorsiflexion and plantar flexion, following removal of any additional bone spurs utilizing rongeur or osteotome.  At this time the sites were copiously flushed with sterile antibiotic solution and cleared of all osteophytes and soft tissue debris.  Prior to closure some hypertrophic synovial tissue and synovitis were resected from the capsular tissues prior to hem being closed.  Capsular tissues were then reapproximated utilizing 3-0 Vicryl in a continuous running fashion.  Subcutaneous tissues were reapproximated with 4-0 Vicryl in a continuous running fashion.  Skin was reapproximated with 5-0 Vicryl in a subcuticular fashion.  Steri-Strips, Betadine saline-soaked sponge and compressive dressing were applied.  Should note that once dexamethasone sodium phosphate was infiltrated into the surgical site prior to dressing application,  range-of-motion was established (nearly 90 degrees dorsiflexion and approximately 45 degrees plantar flexion at the MPJ level).  At this time compressive dressing was applied, ankle tourniquet was deflated with immediate return of  perfusion being noted.  It should be noted that Vi-Scan fluoroscopy was utilized while in the OR to ascertain positioning of the implants, positions of the metatarsal components and phalangeal components; all to be in a normal anatomic erectus position.  The  patient was then returned from the OR to recovery in satisfactory condition, with vital signs stable.  DISPOSITION:  The patient to be discharged with all written postoperative instructions, prescriptions for pain and antibiotic medication, and an appointment for follow-up have been scheduled. DD:  01/15/00 TD:  01/16/00 Job: [id number]

## 2011-04-05 NOTE — Op Note (Signed)
NAME:  Roger Kent, Roger Kent                      ACCOUNT NO.:  [id number]   MEDICAL RECORD NO.:  [id number]                   PATIENT TYPE:  AMB   LOCATION:  DSC                                  FACILITY:  [facility]   PHYSICIAN:  Feliberto Gottron. Turner Daniels, M.D.                DATE OF BIRTH:  June 25, 1946   DATE OF PROCEDURE:  01/18/2004  DATE OF DISCHARGE:                                 OPERATIVE REPORT   PREOPERATIVE DIAGNOSIS:  Right knee medial meniscal tear, chondromalacia,  lateral meniscal tear.   POSTOPERATIVE DIAGNOSIS:  Right knee medial meniscal tear, chondromalacia,  lateral meniscal tear.   PROCEDURE:  Right knee partial arthroscopic medial meniscectomy and, in  addition, he had chondromalacia of the medial side of the medial femoral  condyle focal grade 4 and the medial side of the lateral tibial condyle,  focal grade 4, lateral meniscectomy.   SURGEON:  Feliberto Gottron. Turner Daniels, M.D.   FIRST ASSISTANT:  Erskine Squibb B. Jannet Mantis.   ANESTHESIA:  General endotracheal anesthesia.   ESTIMATED BLOOD LOSS:  Minimal.   FLUIDS REPLACED:  800 mL crystalloid.   DRAINS:  None.   TOURNIQUET TIME:  None.   INDICATIONS FOR PROCEDURE:  65 year old man had a partial arthroscopic  medial meniscectomy to the left knee about 30 years ago.  He has similar  pain and catching on the right side.  X-rays were consistent with mild to  moderate arthritis of the right knee medially and he desires elective right  knee arthroscopic evaluation having failed conservative treatment with anti-  inflammatory medicine and observation.   DESCRIPTION OF PROCEDURE:  The patient was identified by her arm band and  taken to the operating room at [facility].  Appropriate  anesthetic monitors were attached and general endotracheal anesthesia was  induced with the patient in the supine position.  Lateral post was applied  to the table.  The right lower extremity was prepped and draped in the usual  sterile fashion  from the ankle to the mid thigh and inferomedial and  inferolateral peripatellar portals were infiltrated with 2-3 mL of 0.5%  Marcaine and epinephrine solution.  Then, using a #11 blade, standard  inferomedial and inferolateral peripatellar portals were then made allowing  introduction of the arthroscope to the inferolateral portal and the outflow  to the inferomedial portal.  The patella and trochlea were in good  condition.  Moving to the medial side, focal grade 4 chondromalacia of the  medial tibial plateau with flap tears were identified and debrided.  More  importantly, complex degenerative acute on chronic tearing of the medial  meniscus, the medial to posterior medial horns, was identified and debrided  with straight biters, curved biters, and a 3.5 Gator sucker shaver.  Moving  into the notch, the ACL and PCL were intact.  On the lateral side, the  patient had some diffuse degenerative tearing of the lateral meniscus and  this was trimmed back to a stable margin.  The lateral tibial plateau had  focal grade 4 chondromalacia near the tibial spine and this was also  debrided removing flap tears, as well.  At this point, the gutters were  cleared, the scope was taken medial and lateral with the PCL, clearing the  posterior compartment.  The knee was irrigated out with normal saline  solution.  The arthroscopic instruments were removed.  A dressing of  Xeroform, 4 by 4 dressing sponges, Webril, and an Ace wrap were applied.  The patient was then awakened and taken to the recovery room without  difficulty.                                               Feliberto Gottron. Turner Daniels, M.D.    Ovid Curd  D:  01/18/2004  T:  01/18/2004  Job:  [id number]

## 2011-05-13 ENCOUNTER — Other Ambulatory Visit (INDEPENDENT_AMBULATORY_CARE_PROVIDER_SITE_OTHER): Payer: Medicare Other

## 2011-05-13 DIAGNOSIS — R5381 Other malaise: Secondary | ICD-10-CM

## 2011-05-13 DIAGNOSIS — B1710 Acute hepatitis C without hepatic coma: Secondary | ICD-10-CM

## 2011-05-13 DIAGNOSIS — R5383 Other fatigue: Secondary | ICD-10-CM

## 2011-05-13 DIAGNOSIS — Q210 Ventricular septal defect: Secondary | ICD-10-CM

## 2011-05-13 DIAGNOSIS — R030 Elevated blood-pressure reading, without diagnosis of hypertension: Secondary | ICD-10-CM

## 2011-05-13 DIAGNOSIS — E785 Hyperlipidemia, unspecified: Secondary | ICD-10-CM

## 2011-05-13 DIAGNOSIS — M8580 Other specified disorders of bone density and structure, unspecified site: Secondary | ICD-10-CM

## 2011-05-13 LAB — CBC WITH DIFFERENTIAL/PLATELET
Basophils Absolute: 0 10*3/uL (ref 0.0–0.1)
Eosinophils Absolute: 0.1 10*3/uL (ref 0.0–0.7)
HCT: 42.3 % (ref 39.0–52.0)
Hemoglobin: 14.2 g/dL (ref 13.0–17.0)
Lymphs Abs: 2.4 10*3/uL (ref 0.7–4.0)
MCHC: 33.5 g/dL (ref 30.0–36.0)
Monocytes Absolute: 0.6 10*3/uL (ref 0.1–1.0)
Neutro Abs: 4 10*3/uL (ref 1.4–7.7)
Platelets: 177 10*3/uL (ref 150.0–400.0)
RDW: 14.8 % — ABNORMAL HIGH (ref 11.5–14.6)

## 2011-05-13 LAB — BASIC METABOLIC PANEL
BUN: 15 mg/dL (ref 6–23)
CO2: 28 mEq/L (ref 19–32)
Calcium: 9 mg/dL (ref 8.4–10.5)
Creatinine, Ser: 0.9 mg/dL (ref 0.4–1.5)
Glucose, Bld: 91 mg/dL (ref 70–99)

## 2011-05-13 LAB — LIPID PANEL
Cholesterol: 164 mg/dL (ref 0–200)
HDL: 39 mg/dL — ABNORMAL LOW (ref >39.00)
Triglycerides: 77 mg/dL (ref 0.0–149.0)

## 2011-05-13 LAB — TSH: TSH: 2.01 u[IU]/mL (ref 0.35–5.50)

## 2011-05-13 LAB — VITAMIN D 25 HYDROXY (VIT D DEFICIENCY, FRACTURES): Vit D, 25-Hydroxy: 49 ng/mL (ref 30–89)

## 2011-05-13 LAB — HEPATIC FUNCTION PANEL
Albumin: 4.1 g/dL (ref 3.5–5.2)
Total Bilirubin: 0.9 mg/dL (ref 0.3–1.2)

## 2011-05-16 ENCOUNTER — Encounter: Payer: Self-pay | Admitting: Internal Medicine

## 2011-05-17 ENCOUNTER — Encounter: Payer: Self-pay | Admitting: Internal Medicine

## 2011-05-17 ENCOUNTER — Ambulatory Visit (INDEPENDENT_AMBULATORY_CARE_PROVIDER_SITE_OTHER): Payer: Medicare Other | Admitting: Internal Medicine

## 2011-05-17 DIAGNOSIS — I10 Essential (primary) hypertension: Secondary | ICD-10-CM

## 2011-05-17 DIAGNOSIS — N318 Other neuromuscular dysfunction of bladder: Secondary | ICD-10-CM

## 2011-05-17 DIAGNOSIS — G4700 Insomnia, unspecified: Secondary | ICD-10-CM

## 2011-05-17 DIAGNOSIS — E785 Hyperlipidemia, unspecified: Secondary | ICD-10-CM

## 2011-05-17 DIAGNOSIS — B1710 Acute hepatitis C without hepatic coma: Secondary | ICD-10-CM

## 2011-05-17 DIAGNOSIS — M810 Age-related osteoporosis without current pathological fracture: Secondary | ICD-10-CM

## 2011-05-17 NOTE — Patient Instructions (Signed)
Normal BP<130/85 

## 2011-05-17 NOTE — Assessment & Plan Note (Signed)
On Rx 

## 2011-05-17 NOTE — Assessment & Plan Note (Signed)
Cont w/good diet 

## 2011-05-17 NOTE — Assessment & Plan Note (Signed)
Normal BP. He wants to see if he needs BP med: he can try 1/2 tab a day

## 2011-05-17 NOTE — Progress Notes (Signed)
  Subjective:    Patient ID: Roger Kent, male    DOB: [birth year], 65 y.o.   MRN: [id number]  HPI  The patient presents for a follow-up of  chronic hypertension, BPH, insomnia controlled with medicines    Review of Systems  Constitutional: Negative for appetite change, fatigue and unexpected weight change.  HENT: Negative for nosebleeds, congestion, sore throat, sneezing, trouble swallowing and neck pain.   Eyes: Negative for itching and visual disturbance.  Respiratory: Negative for cough.   Cardiovascular: Negative for chest pain, palpitations and leg swelling.  Gastrointestinal: Negative for nausea, diarrhea, blood in stool and abdominal distention.  Genitourinary: Negative for frequency and hematuria.  Musculoskeletal: Negative for back pain, joint swelling and gait problem.  Skin: Negative for rash.  Neurological: Negative for dizziness, tremors, speech difficulty and weakness.  Psychiatric/Behavioral: Negative for sleep disturbance, dysphoric mood and agitation. The patient is not nervous/anxious.        Objective:   Physical Exam  Constitutional: He is oriented to person, place, and time. He appears well-developed and well-nourished.  HENT:  Mouth/Throat: Oropharynx is clear and moist.  Eyes: Conjunctivae are normal. Pupils are equal, round, and reactive to light.  Neck: Normal range of motion. No JVD present. No thyromegaly present.  Cardiovascular: Normal rate, regular rhythm, normal heart sounds and intact distal pulses.  Exam reveals no gallop and no friction rub.   No murmur heard. Pulmonary/Chest: Effort normal and breath sounds normal. No respiratory distress. He has no wheezes. He has no rales. He exhibits no tenderness.  Abdominal: Soft. Bowel sounds are normal. He exhibits no distension and no mass. There is no tenderness. There is no rebound and no guarding.  Musculoskeletal: Normal range of motion. He exhibits no edema and no tenderness.  Lymphadenopathy:      He has no cervical adenopathy.  Neurological: He is alert and oriented to person, place, and time. He has normal reflexes. No cranial nerve deficit. He exhibits normal muscle tone. Coordination normal.  Skin: Skin is warm and dry. No rash noted.  Psychiatric: He has a normal mood and affect. His behavior is normal. Judgment and thought content normal.        Lab Results  Component Value Date   WBC 7.1 05/13/2011   HGB 14.2 05/13/2011   HCT 42.3 05/13/2011   PLT 177.0 05/13/2011   CHOL 164 05/13/2011   TRIG 77.0 05/13/2011   HDL 39.00* 05/13/2011   ALT 19 05/13/2011   AST 24 05/13/2011   NA 141 05/13/2011   K 4.5 05/13/2011   CL 108 05/13/2011   CREATININE 0.9 05/13/2011   BUN 15 05/13/2011   CO2 28 05/13/2011   TSH 2.01 05/13/2011   PSA 5.99* 11/13/2010   INR 2.4 RATIO* 09/17/2007   HGBA1C 5.8 04/24/2007     Assessment & Plan:

## 2011-05-17 NOTE — Assessment & Plan Note (Signed)
F/u w/Dr Ottelin 

## 2011-05-17 NOTE — Assessment & Plan Note (Signed)
On rx 

## 2011-08-28 ENCOUNTER — Telehealth: Payer: Self-pay | Admitting: *Deleted

## 2011-08-28 NOTE — Telephone Encounter (Signed)
Wife left VM regarding patient. He has upcoming apt W/MD. Wife is concerned - Pt has been seeing chiropractor daily w/little relief in his back pain. He also has seen an orthopedic that "only wants to give him drugs"

## 2011-08-29 LAB — LUPUS ANTICOAGULANT PANEL
DRVVT: 45.6 (ref 36.1–47.0)
Lupus Anticoagulant: NOT DETECTED

## 2011-08-29 LAB — CARDIOLIPIN ANTIBODIES, IGG, IGM, IGA
Anticardiolipin IgA: 8 — ABNORMAL LOW (ref <13)
Anticardiolipin IgG: <7 — ABNORMAL LOW (ref <11)
Anticardiolipin IgM: <7 — ABNORMAL LOW (ref <10)

## 2011-08-29 LAB — CBC
HCT: 42.7
Hemoglobin: 14.2
Hemoglobin: 14.3
MCHC: 33.5
MCV: 92.4
RBC: 4.59
RBC: 4.64
RDW: 14.4 — ABNORMAL HIGH
WBC: 8.1
WBC: 8.4

## 2011-08-29 LAB — POCT I-STAT CREATININE: Creatinine, Ser: 0.9

## 2011-08-29 LAB — ANTITHROMBIN III: AntiThromb III Func: 91 (ref 76–126)

## 2011-08-29 LAB — I-STAT 8, (EC8 V) (CONVERTED LAB)
Acid-Base Excess: 3 — ABNORMAL HIGH
BUN: 11
Chloride: 108
Glucose, Bld: 92
pCO2, Ven: 30.6 — ABNORMAL LOW
pH, Ven: 7.516 — ABNORMAL HIGH

## 2011-08-29 LAB — DIFFERENTIAL
Basophils Absolute: 0.1
Lymphocytes Relative: 22
Lymphs Abs: 1.8
Monocytes Absolute: 0.9 — ABNORMAL HIGH
Monocytes Relative: 11
Neutro Abs: 5.4

## 2011-08-29 LAB — BETA-2-GLYCOPROTEIN I ABS, IGG/M/A: Beta-2 Glyco I IgG: <4 U/mL (ref <20)

## 2011-08-29 LAB — APTT: aPTT: 29

## 2011-09-03 ENCOUNTER — Other Ambulatory Visit: Payer: Self-pay

## 2011-09-03 MED ORDER — AMOXICILLIN 500 MG PO CAPS: 500.0000 mg | ORAL_CAPSULE | ORAL | Status: AC

## 2011-09-03 NOTE — Telephone Encounter (Signed)
..   Requested Prescriptions   Signed Prescriptions Disp Refills  . amoxicillin (AMOXIL) 500 MG capsule 16 capsule 2    Sig: Take 1 capsule (500 mg total) by mouth as directed. Take 4 capsules by mouth 1 hour before Dental appointment.    Authorizing Provider: Lewayne Bunting    Ordering User: Lacie Scotts   E-scribe to [facility] pharmacy.

## 2011-09-16 ENCOUNTER — Ambulatory Visit (INDEPENDENT_AMBULATORY_CARE_PROVIDER_SITE_OTHER): Payer: Medicare Other | Admitting: Internal Medicine

## 2011-09-16 ENCOUNTER — Ambulatory Visit (INDEPENDENT_AMBULATORY_CARE_PROVIDER_SITE_OTHER)
Admission: RE | Admit: 2011-09-16 | Discharge: 2011-09-16 | Disposition: A | Discharge status: Home / Self Care | Payer: Medicare Other | Source: Ambulatory Visit | Attending: Internal Medicine | Admitting: Internal Medicine

## 2011-09-16 ENCOUNTER — Encounter: Payer: Self-pay | Admitting: Internal Medicine

## 2011-09-16 VITALS — BP 138/82 | HR 84 | Temp 98.3°F | Wt 182.0 lb

## 2011-09-16 DIAGNOSIS — Z23 Encounter for immunization: Secondary | ICD-10-CM

## 2011-09-16 DIAGNOSIS — R05 Cough: Secondary | ICD-10-CM

## 2011-09-16 DIAGNOSIS — G4700 Insomnia, unspecified: Secondary | ICD-10-CM

## 2011-09-16 DIAGNOSIS — M545 Low back pain, unspecified: Secondary | ICD-10-CM | POA: Insufficient documentation

## 2011-09-16 DIAGNOSIS — I10 Essential (primary) hypertension: Secondary | ICD-10-CM

## 2011-09-16 DIAGNOSIS — R059 Cough, unspecified: Secondary | ICD-10-CM

## 2011-09-16 MED ORDER — AZITHROMYCIN 250 MG PO TABS: ORAL_TABLET | ORAL | Status: AC

## 2011-09-16 MED ORDER — MONTELUKAST SODIUM 10 MG PO TABS: 10.0000 mg | ORAL_TABLET | Freq: Every day | ORAL | Status: DC

## 2011-09-16 MED ORDER — FLUTICASONE PROPIONATE 50 MCG/ACT NA SUSP: 1.0000 | Freq: Every day | NASAL | Status: DC

## 2011-09-16 NOTE — Assessment & Plan Note (Signed)
Continue with current prescription therapy as reflected on the Med list.  

## 2011-09-16 NOTE — Patient Instructions (Signed)
No heavy lifting 

## 2011-09-16 NOTE — Assessment & Plan Note (Signed)
2012 Chronic OA thor and LS back Chiropractor No heavy lifting

## 2011-09-16 NOTE — Progress Notes (Signed)
  Subjective:    Patient ID: Roger Kent, male    DOB: 1946/02/17, 65 y.o.   MRN: [id number]  HPI The patient presents for a follow-up of  chronic hypertension, allergies, controlled with medicines C/o LBP 2-5/10 C/o cough and wheezing     Review of Systems  Constitutional: Negative for appetite change, fatigue and unexpected weight change.  HENT: Negative for nosebleeds, congestion, sore throat, sneezing, trouble swallowing and neck pain.   Eyes: Negative for itching and visual disturbance.  Respiratory: Negative for cough.   Cardiovascular: Negative for chest pain, palpitations and leg swelling.  Gastrointestinal: Negative for nausea, diarrhea, blood in stool and abdominal distention.  Genitourinary: Positive for frequency. Negative for hematuria.  Musculoskeletal: Positive for back pain. Negative for joint swelling and gait problem.  Skin: Negative for rash.  Neurological: Negative for dizziness, tremors, speech difficulty and weakness.  Psychiatric/Behavioral: Negative for sleep disturbance, dysphoric mood and agitation. The patient is not nervous/anxious.        Objective:   Physical Exam  Constitutional: He is oriented to person, place, and time. He appears well-developed.  HENT:  Mouth/Throat: Oropharynx is clear and moist.  Eyes: Conjunctivae are normal. Pupils are equal, round, and reactive to light.  Neck: Normal range of motion. No JVD present. No thyromegaly present.  Cardiovascular: Normal rate, regular rhythm, normal heart sounds and intact distal pulses.  Exam reveals no gallop and no friction rub.   No murmur heard. Pulmonary/Chest: Effort normal and breath sounds normal. No respiratory distress. He has no wheezes. He has no rales. He exhibits no tenderness.  Abdominal: Soft. Bowel sounds are normal. He exhibits no distension and no mass. There is no tenderness. There is no rebound and no guarding.  Musculoskeletal: Normal range of motion. He exhibits  tenderness (LS tender w/ROM). He exhibits no edema.  Lymphadenopathy:    He has no cervical adenopathy.  Neurological: He is alert and oriented to person, place, and time. He has normal reflexes. No cranial nerve deficit. He exhibits normal muscle tone. Coordination normal.  Skin: Skin is warm and dry. No rash noted.  Psychiatric: He has a normal mood and affect. His behavior is normal. Judgment and thought content normal.         Assessment & Plan:

## 2011-09-16 NOTE — Assessment & Plan Note (Signed)
10/12 ? Etiology Try Flonase and singulair Cnt PPI and Zyrtec Zpac

## 2011-09-17 ENCOUNTER — Telehealth: Payer: Self-pay | Admitting: Internal Medicine

## 2011-09-17 NOTE — Telephone Encounter (Signed)
Roger Kent, please, inform patient that his cxr is nl Thx

## 2011-09-18 NOTE — Telephone Encounter (Signed)
Left detailed mess informing pt of below.  

## 2011-09-27 ENCOUNTER — Ambulatory Visit: Payer: Medicare Other | Attending: Internal Medicine | Admitting: Physical Therapy

## 2011-09-27 DIAGNOSIS — IMO0001 Reserved for inherently not codable concepts without codable children: Secondary | ICD-10-CM | POA: Insufficient documentation

## 2011-09-27 DIAGNOSIS — M545 Low back pain, unspecified: Secondary | ICD-10-CM | POA: Insufficient documentation

## 2011-09-27 DIAGNOSIS — M2569 Stiffness of other specified joint, not elsewhere classified: Secondary | ICD-10-CM | POA: Insufficient documentation

## 2011-09-30 ENCOUNTER — Ambulatory Visit (INDEPENDENT_AMBULATORY_CARE_PROVIDER_SITE_OTHER): Payer: Medicare Other | Admitting: Ophthalmology

## 2011-09-30 DIAGNOSIS — H3530 Unspecified macular degeneration: Secondary | ICD-10-CM

## 2011-09-30 DIAGNOSIS — H33009 Unspecified retinal detachment with retinal break, unspecified eye: Secondary | ICD-10-CM

## 2011-09-30 DIAGNOSIS — H43819 Vitreous degeneration, unspecified eye: Secondary | ICD-10-CM

## 2011-10-01 ENCOUNTER — Ambulatory Visit: Payer: Medicare Other | Admitting: Internal Medicine

## 2011-10-15 ENCOUNTER — Ambulatory Visit: Payer: Medicare Other | Admitting: Physical Therapy

## 2011-10-17 ENCOUNTER — Ambulatory Visit: Payer: Medicare Other | Admitting: Rehabilitation

## 2011-10-18 ENCOUNTER — Encounter: Payer: Medicare Other | Admitting: Physical Therapy

## 2011-10-22 ENCOUNTER — Ambulatory Visit: Payer: Medicare Other | Attending: Internal Medicine | Admitting: Physical Therapy

## 2011-10-22 DIAGNOSIS — M545 Low back pain, unspecified: Secondary | ICD-10-CM | POA: Insufficient documentation

## 2011-10-22 DIAGNOSIS — IMO0001 Reserved for inherently not codable concepts without codable children: Secondary | ICD-10-CM | POA: Insufficient documentation

## 2011-10-22 DIAGNOSIS — M2569 Stiffness of other specified joint, not elsewhere classified: Secondary | ICD-10-CM | POA: Insufficient documentation

## 2011-10-25 ENCOUNTER — Ambulatory Visit: Payer: Medicare Other | Admitting: Physical Therapy

## 2011-10-29 ENCOUNTER — Encounter: Payer: Self-pay | Admitting: Internal Medicine

## 2011-10-29 ENCOUNTER — Ambulatory Visit: Payer: Medicare Other | Admitting: Physical Therapy

## 2011-10-29 ENCOUNTER — Ambulatory Visit (INDEPENDENT_AMBULATORY_CARE_PROVIDER_SITE_OTHER): Payer: Medicare Other | Admitting: Internal Medicine

## 2011-10-29 VITALS — BP 124/86 | HR 80 | Temp 97.9°F | Resp 16 | Wt 181.0 lb

## 2011-10-29 DIAGNOSIS — M545 Low back pain, unspecified: Secondary | ICD-10-CM

## 2011-10-29 DIAGNOSIS — D485 Neoplasm of uncertain behavior of skin: Secondary | ICD-10-CM

## 2011-10-29 DIAGNOSIS — W57XXXA Bitten or stung by nonvenomous insect and other nonvenomous arthropods, initial encounter: Secondary | ICD-10-CM

## 2011-10-29 MED ORDER — TRIAMCINOLONE ACETONIDE 0.5 % EX CREA: TOPICAL_CREAM | Freq: Three times a day (TID) | CUTANEOUS | Status: DC

## 2011-10-29 NOTE — Assessment & Plan Note (Signed)
Cont w/PT - better

## 2011-10-29 NOTE — Assessment & Plan Note (Signed)
Triamc cream tid Will watch

## 2011-10-29 NOTE — Patient Instructions (Signed)
Postprocedure instructions :    A Band-Aid should be  changed twice daily. You can take a shower tomorrow.  Keep the wounds clean. You can wash them with liquid soap and water. Pat dry with gauze or a Kleenex tissue  Before applying antibiotic ointment and a Band-Aid.   You need to report immediately  if fever, chills or any signs of infection develop.    The biopsy results should be available in 1 -2 weeks. 

## 2011-10-29 NOTE — Progress Notes (Signed)
  Subjective:    Patient ID: Roger Kent, male    DOB: 05-29-1946, 65 y.o.   MRN: [id number]  HPI  C/o tick bite L arm last week C/o mole on R ear - new, and a lesion on L forearm x years  F/u LBP - better  Review of Systems  Constitutional: Negative for activity change and fatigue.  Musculoskeletal: Positive for back pain (better).  Neurological: Negative for dizziness.  Psychiatric/Behavioral: The patient is not nervous/anxious.        Objective:   Physical Exam  Constitutional: He is oriented to person, place, and time. He appears well-developed.  HENT:  Mouth/Throat: Oropharynx is clear and moist.  Eyes: Conjunctivae are normal. Pupils are equal, round, and reactive to light.  Neck: Normal range of motion. No JVD present. No thyromegaly present.  Cardiovascular: Exam reveals no friction rub.   Pulmonary/Chest: Effort normal and breath sounds normal. No respiratory distress.  Abdominal: Soft. Bowel sounds are normal. He exhibits no distension.  Musculoskeletal: Normal range of motion. He exhibits tenderness (ls is a little tender w/ROM). He exhibits no edema.  Neurological: He is alert and oriented to person, place, and time. He has normal reflexes. Coordination normal.  Skin: Skin is warm and dry. No rash noted. No erythema.       Skin lesions  Psychiatric: He has a normal mood and affect. His behavior is normal. Judgment normal.      Procedure Note :     Procedure :  Skin biopsy   Indication:  Changing mole (s ),  Suspicious lesion(s)   Risks including unsuccessful procedure , bleeding, infection, bruising, scar, a need for another complete procedure and others were explained to the patient in detail as well as the benefits. Informed consent was obtained and signed.   The patient was placed in a decubitus position.  Lesion #1 on   R ear  measuring  5x2   mm   Skin over lesion #1  was prepped with Betadine and alcohol  and anesthetized with 1 cc of 2%  lidocaine and epinephrine, using a 25-gauge 1 inch needle.  Shave biopsy with a sterile Dermablade was carried out in the usual fashion. Partial bx was done. Hyfrecator was used to destroy the rest of the lesion potentially left behind and for hemostasis. Band-Aid was applied with antibiotic ointment.    Lesion #2 on L prox forearm    measuring 11x10  mm   Skin over lesion #2  was prepped with Betadine and alcohol  and anesthetized with 1 cc of 2% lidocaine and epinephrine, using a 25-gauge 1 inch needle.  Shave biopsy with a sterile Dermablade was carried out in the usual fashion.   Hyfrecator was used to destroy the rest of the lesion potentially left behind and for hemostasis. Band-Aid was applied with antibiotic ointment.    Tolerated well. Complications none.      Postprocedure instructions :    A Band-Aid should be  changed twice daily. You can take a shower tomorrow.  Keep the wounds clean. You can wash them with liquid soap and water. Pat dry with gauze or a Kleenex tissue  Before applying antibiotic ointment and a Band-Aid.   You need to report immediately  if fever, chills or any signs of infection develop.    The biopsy results should be available in 1 -2 weeks.       Assessment & Plan:

## 2011-10-29 NOTE — Assessment & Plan Note (Signed)
Will bx 

## 2011-10-31 ENCOUNTER — Ambulatory Visit: Payer: Medicare Other | Admitting: Rehabilitation

## 2011-11-04 ENCOUNTER — Encounter: Payer: Medicare Other | Admitting: Rehabilitation

## 2011-11-06 ENCOUNTER — Ambulatory Visit: Payer: Medicare Other | Admitting: Rehabilitation

## 2011-11-07 ENCOUNTER — Telehealth: Payer: Self-pay | Admitting: *Deleted

## 2011-11-07 ENCOUNTER — Ambulatory Visit: Payer: Medicare Other | Admitting: Rehabilitation

## 2011-11-07 DIAGNOSIS — C4490 Unspecified malignant neoplasm of skin, unspecified: Secondary | ICD-10-CM
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
## 2011-11-07 NOTE — Telephone Encounter (Signed)
Pt's spouse called on behalf of pt requesting a referral to wound center for wound on left arm that is not healing. They will be out of town until next week.

## 2011-11-08 NOTE — Telephone Encounter (Signed)
It shoud be a skin surg center Will ref Thx

## 2011-11-08 NOTE — Telephone Encounter (Signed)
Yes

## 2011-11-14 ENCOUNTER — Telehealth: Payer: Self-pay

## 2011-11-14 NOTE — Telephone Encounter (Signed)
Pt's spouse advised 

## 2011-11-14 NOTE — Telephone Encounter (Signed)
Spouse called requesting referral to Oncologist regarding cancerous areas discovered via biopsy.

## 2011-11-14 NOTE — Telephone Encounter (Signed)
No need for Onc - we did  ref  [facility] (best place in [place]). Thx

## 2011-12-16 ENCOUNTER — Other Ambulatory Visit: Payer: Self-pay | Admitting: Internal Medicine

## 2011-12-16 ENCOUNTER — Other Ambulatory Visit: Payer: Self-pay

## 2011-12-16 MED ORDER — OLMESARTAN MEDOXOMIL 20 MG PO TABS: 20.0000 mg | ORAL_TABLET | Freq: Every day | ORAL | Status: DC

## 2011-12-18 DIAGNOSIS — L570 Actinic keratosis: Secondary | ICD-10-CM | POA: Diagnosis not present

## 2011-12-18 DIAGNOSIS — L819 Disorder of pigmentation, unspecified: Secondary | ICD-10-CM | POA: Diagnosis not present

## 2011-12-18 DIAGNOSIS — R238 Other skin changes: Secondary | ICD-10-CM | POA: Diagnosis not present

## 2011-12-18 DIAGNOSIS — C44611 Basal cell carcinoma of skin of unspecified upper limb, including shoulder: Secondary | ICD-10-CM | POA: Diagnosis not present

## 2011-12-18 DIAGNOSIS — D485 Neoplasm of uncertain behavior of skin: Secondary | ICD-10-CM | POA: Diagnosis not present

## 2012-01-29 ENCOUNTER — Ambulatory Visit (INDEPENDENT_AMBULATORY_CARE_PROVIDER_SITE_OTHER): Payer: Medicare Other | Admitting: Internal Medicine

## 2012-01-29 ENCOUNTER — Encounter: Payer: Self-pay | Admitting: Internal Medicine

## 2012-01-29 VITALS — BP 140/90 | HR 80 | Temp 98.5°F | Resp 16 | Wt 187.0 lb

## 2012-01-29 DIAGNOSIS — I10 Essential (primary) hypertension: Secondary | ICD-10-CM

## 2012-01-29 DIAGNOSIS — G4700 Insomnia, unspecified: Secondary | ICD-10-CM

## 2012-01-29 DIAGNOSIS — K2270 Barrett's esophagus without dysplasia: Secondary | ICD-10-CM

## 2012-01-29 DIAGNOSIS — N400 Enlarged prostate without lower urinary tract symptoms: Secondary | ICD-10-CM

## 2012-01-29 DIAGNOSIS — R030 Elevated blood-pressure reading, without diagnosis of hypertension: Secondary | ICD-10-CM

## 2012-01-29 DIAGNOSIS — B1710 Acute hepatitis C without hepatic coma: Secondary | ICD-10-CM | POA: Diagnosis not present

## 2012-01-29 DIAGNOSIS — R972 Elevated prostate specific antigen [PSA]: Secondary | ICD-10-CM

## 2012-01-29 DIAGNOSIS — M545 Low back pain: Secondary | ICD-10-CM

## 2012-01-29 NOTE — Assessment & Plan Note (Signed)
F/u w/urology 

## 2012-01-29 NOTE — Assessment & Plan Note (Signed)
Continue with current prescription therapy as reflected on the Med list.  

## 2012-01-29 NOTE — Assessment & Plan Note (Signed)
2012 Chronic OA thor and LS back Better w/new shoes

## 2012-01-29 NOTE — Assessment & Plan Note (Signed)
Continue with current prescription therapy as reflected on the Med list. Monitor BP

## 2012-01-29 NOTE — Assessment & Plan Note (Signed)
Discussed.

## 2012-01-29 NOTE — Patient Instructions (Signed)
Wt Readings from Last 3 Encounters:  01/29/12 187 lb (84.823 kg)  10/29/11 181 lb (82.101 kg)  09/16/11 182 lb (82.555 kg)   BP Readings from Last 3 Encounters:  01/29/12 140/90  10/29/11 124/86  09/16/11 138/82

## 2012-01-29 NOTE — Progress Notes (Signed)
Patient ID: Roger Kent, male   DOB: May 26, 1946, 66 y.o.   MRN: [id number]  Subjective:    Patient ID: Roger Kent, male    DOB: 30-Apr-1946, 66 y.o.   MRN: [id number]  HPI The patient presents for a follow-up of  chronic hypertension, allergies, anxiety, BPH controlled with medicines. He is doing well... There are some OA complaints  C/o LBP 2-5/10 - better  Wt Readings from Last 3 Encounters:  01/29/12 187 lb (84.823 kg)  10/29/11 181 lb (82.101 kg)  09/16/11 182 lb (82.555 kg)   BP Readings from Last 3 Encounters:  01/29/12 140/90  10/29/11 124/86  09/16/11 138/82        Review of Systems  Constitutional: Negative for appetite change, fatigue and unexpected weight change.  HENT: Negative for nosebleeds, congestion, sore throat, sneezing, trouble swallowing and neck pain.   Eyes: Negative for itching and visual disturbance.  Respiratory: Negative for cough.   Cardiovascular: Negative for chest pain, palpitations and leg swelling.  Gastrointestinal: Negative for nausea, diarrhea, blood in stool and abdominal distention.  Genitourinary: Positive for frequency. Negative for hematuria.  Musculoskeletal: Positive for back pain. Negative for joint swelling and gait problem.  Skin: Negative for rash.  Neurological: Negative for dizziness, tremors, speech difficulty and weakness.  Psychiatric/Behavioral: Negative for sleep disturbance, dysphoric mood and agitation. The patient is not nervous/anxious.        Objective:   Physical Exam  Constitutional: He is oriented to person, place, and time. He appears well-developed.  HENT:  Mouth/Throat: Oropharynx is clear and moist.  Eyes: Conjunctivae are normal. Pupils are equal, round, and reactive to light.  Neck: Normal range of motion. No JVD present. No thyromegaly present.  Cardiovascular: Normal rate, regular rhythm, normal heart sounds and intact distal pulses.  Exam reveals no gallop and no friction rub.   No murmur  heard. Pulmonary/Chest: Effort normal and breath sounds normal. No respiratory distress. He has no wheezes. He has no rales. He exhibits no tenderness.  Abdominal: Soft. Bowel sounds are normal. He exhibits no distension and no mass. There is no tenderness. There is no rebound and no guarding.  Musculoskeletal: Normal range of motion. He exhibits tenderness (LS tender w/ROM). He exhibits no edema.  Lymphadenopathy:    He has no cervical adenopathy.  Neurological: He is alert and oriented to person, place, and time. He has normal reflexes. No cranial nerve deficit. He exhibits normal muscle tone. Coordination normal.  Skin: Skin is warm and dry. No rash noted.  Psychiatric: He has a normal mood and affect. His behavior is normal. Judgment and thought content normal.   Lab Results  Component Value Date   WBC 7.1 05/13/2011   HGB 14.2 05/13/2011   HCT 42.3 05/13/2011   PLT 177.0 05/13/2011   GLUCOSE 91 05/13/2011   CHOL 164 05/13/2011   TRIG 77.0 05/13/2011   HDL 39.00* 05/13/2011   LDLCALC 110* 05/13/2011   ALT 19 05/13/2011   AST 24 05/13/2011   NA 141 05/13/2011   K 4.5 05/13/2011   CL 108 05/13/2011   CREATININE 0.9 05/13/2011   BUN 15 05/13/2011   CO2 28 05/13/2011   TSH 2.01 05/13/2011   PSA 5.99* 11/13/2010   INR 2.4 RATIO* 09/17/2007   HGBA1C 5.8 04/24/2007         Assessment & Plan:

## 2012-01-29 NOTE — Assessment & Plan Note (Signed)
Monitoring LFT, AFP

## 2012-02-06 ENCOUNTER — Other Ambulatory Visit: Payer: Self-pay | Admitting: *Deleted

## 2012-02-06 MED ORDER — SILODOSIN 8 MG PO CAPS: 8.0000 mg | ORAL_CAPSULE | Freq: Two times a day (BID) | ORAL | Status: DC

## 2012-02-07 ENCOUNTER — Telehealth: Payer: Self-pay

## 2012-02-07 NOTE — Telephone Encounter (Signed)
A user error has taken place: encounter opened in error, closed for administrative reasons.

## 2012-02-28 ENCOUNTER — Encounter (INDEPENDENT_AMBULATORY_CARE_PROVIDER_SITE_OTHER): Payer: Medicare Other | Admitting: Ophthalmology

## 2012-03-10 ENCOUNTER — Encounter (INDEPENDENT_AMBULATORY_CARE_PROVIDER_SITE_OTHER): Payer: Medicare Other | Admitting: Ophthalmology

## 2012-03-10 DIAGNOSIS — H33009 Unspecified retinal detachment with retinal break, unspecified eye: Secondary | ICD-10-CM | POA: Diagnosis not present

## 2012-03-10 DIAGNOSIS — H35359 Cystoid macular degeneration, unspecified eye: Secondary | ICD-10-CM | POA: Diagnosis not present

## 2012-03-10 DIAGNOSIS — H43819 Vitreous degeneration, unspecified eye: Secondary | ICD-10-CM

## 2012-03-19 ENCOUNTER — Encounter: Payer: Self-pay | Admitting: Cardiology

## 2012-03-19 ENCOUNTER — Ambulatory Visit (INDEPENDENT_AMBULATORY_CARE_PROVIDER_SITE_OTHER): Payer: Medicare Other | Admitting: Cardiology

## 2012-03-19 VITALS — BP 127/77 | HR 76 | Ht 73.0 in | Wt 188.0 lb

## 2012-03-19 DIAGNOSIS — I10 Essential (primary) hypertension: Secondary | ICD-10-CM

## 2012-03-19 DIAGNOSIS — E785 Hyperlipidemia, unspecified: Secondary | ICD-10-CM | POA: Diagnosis not present

## 2012-03-19 DIAGNOSIS — Q210 Ventricular septal defect: Secondary | ICD-10-CM | POA: Diagnosis not present

## 2012-03-19 NOTE — Progress Notes (Signed)
HPI: [name] is a pleasant gentleman with past medical history of congenital VSD, and prior DVT; I last saw him 12/10.  Last echo was performed in May of 2012. The LV function was normal. There was a small perimembranous ventricular septal defect. There was mild right atrial and right ventricular enlargement. There was mild aortic and mitral regurgitation. There was mild left atrial enlargement.  Also note a previous nuclear study performed in January of 2003 showed an ejection fraction of 50% and no ischemia or infarction. Since I last saw him the patient has dyspnea with more extreme activities but not with routine activities. It is relieved with rest. It is not associated with chest pain. There is no orthopnea, PND or pedal edema. There is no syncope or palpitations. There is no exertional chest pain.  Current Outpatient Prescriptions  Medication Sig Dispense Refill  . aspirin 81 MG EC tablet Take 81 mg by mouth as needed.       . Bromfenac Sodium (BROMDAY) 0.09 % (DAILY) SOLN Apply to eye every other day.       . cetirizine (ZYRTEC) 10 MG tablet Take 10 mg by mouth as needed.       . Difluprednate (DUREZOL) 0.05 % EMUL Apply 1 drop to eye every other day.        . montelukast (SINGULAIR) 10 MG tablet Take 1 tablet (10 mg total) by mouth daily.  90 tablet  3  . Multiple Vitamins-Minerals (DAILY [employer] FORMULA PO) [employer] respiratory formula 1 tab po qd       . niacin 250 MG tablet Take 250 mg by mouth daily.       Marland Kitchen olmesartan (BENICAR) 20 MG tablet Take 20 mg by mouth as directed.       Maxwell Caul Bicarbonate (ZEGERID PO) 1 tab po qd       . silodosin (RAPAFLO) 8 MG CAPS capsule Take 1 capsule (8 mg total) by mouth 2 (two) times daily.  180 capsule  2  . Valerian 450 MG CAPS Take 5 capsules by mouth at bedtime.         Past Medical History  Diagnosis Date  . Hyperlipidemia   . BPH (benign prostatic hyperplasia)   . Anxiety state, unspecified   .  Hypertonicity of bladder   . Unspecified tinnitus   . Personal history of urinary calculi   . Esophageal reflux   . Acute hepatitis C without mention of hepatic coma   . Osteoporosis, unspecified   . Barrett's esophagus   . Hiatal hernia   . Insomnia   . DVT (deep venous thrombosis)   . Allergic rhinitis, cause unspecified   . Recent retinal detachment, partial, with giant tear   . Ventricular septal defect   . Elevated PSA     Dr Vernie Ammons  . Hypertension     Past Surgical History  Procedure Date  . Retinal detachment surgery   . Knee surgery     History   Social History  . Marital Status: Married    Spouse Name: Roger Kent    Number of Children: N/A  . Years of Education: N/A   Occupational History  . 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 Smoking status: Never Smoker   . Smokeless tobacco: Not on file  . Alcohol Use: No  . Drug Use: No  . Sexually Active: Yes  Other Topics Concern  . Not on file   Social History Narrative       ROS: no fevers or chills, productive cough, hemoptysis, dysphasia, odynophagia, melena, hematochezia, dysuria, hematuria, rash, seizure activity, orthopnea, PND, pedal edema, claudication. Remaining systems are negative.  Physical Exam: Well-developed well-nourished in no acute distress.  Skin is warm and dry.  HEENT is normal.  Neck is supple.  Chest is clear to auscultation with normal expansion.  Cardiovascular exam is regular rate and rhythm. 2/6 systolic murmur left sternal border. Abdominal exam nontender or distended. No masses palpated. Extremities show no edema. neuro grossly intact  ECG normal sinus rhythm at a rate of 76. No ST changes.

## 2012-03-19 NOTE — Patient Instructions (Signed)

## 2012-03-19 NOTE — Assessment & Plan Note (Signed)
Blood pressure controlled. Continue present medications. 

## 2012-03-19 NOTE — Assessment & Plan Note (Signed)
Plan repeat echocardiogram. If right heart enlargement is further we may need to evaluate the size of his VSD and need for closure. It has been small in the past. Continue SBE prophylaxis.

## 2012-03-19 NOTE — Assessment & Plan Note (Signed)
Management per primary care. 

## 2012-03-26 ENCOUNTER — Other Ambulatory Visit: Payer: Self-pay

## 2012-03-26 ENCOUNTER — Ambulatory Visit (HOSPITAL_COMMUNITY): Payer: Medicare Other | Attending: Cardiology

## 2012-03-26 DIAGNOSIS — Q210 Ventricular septal defect: Secondary | ICD-10-CM | POA: Diagnosis not present

## 2012-03-26 DIAGNOSIS — I510 Cardiac septal defect, acquired: Secondary | ICD-10-CM

## 2012-03-26 DIAGNOSIS — B1920 Unspecified viral hepatitis C without hepatic coma: Secondary | ICD-10-CM | POA: Diagnosis not present

## 2012-04-29 ENCOUNTER — Ambulatory Visit (INDEPENDENT_AMBULATORY_CARE_PROVIDER_SITE_OTHER): Payer: Medicare Other | Admitting: Internal Medicine

## 2012-04-29 ENCOUNTER — Encounter: Payer: Self-pay | Admitting: Internal Medicine

## 2012-04-29 VITALS — BP 148/90 | HR 84 | Temp 97.9°F | Resp 16 | Wt 185.0 lb

## 2012-04-29 DIAGNOSIS — F411 Generalized anxiety disorder: Secondary | ICD-10-CM | POA: Diagnosis not present

## 2012-04-29 DIAGNOSIS — K219 Gastro-esophageal reflux disease without esophagitis: Secondary | ICD-10-CM | POA: Diagnosis not present

## 2012-04-29 DIAGNOSIS — I10 Essential (primary) hypertension: Secondary | ICD-10-CM

## 2012-04-29 DIAGNOSIS — M545 Low back pain: Secondary | ICD-10-CM | POA: Diagnosis not present

## 2012-04-29 MED ORDER — OLMESARTAN MEDOXOMIL 40 MG PO TABS: 40.0000 mg | ORAL_TABLET | Freq: Every day | ORAL | Status: DC

## 2012-04-29 NOTE — Progress Notes (Signed)
   Subjective:    Patient ID: Roger Kent, male    DOB: 08/07/1946, 66 y.o.   MRN: [id number]  HPI The patient presents for a follow-up of  chronic hypertension, allergies, anxiety, BPH controlled with medicines. He is doing well... There are some OA complaints  C/o LBP 2-5/10 - better  Wt Readings from Last 3 Encounters:  04/29/12 185 lb (83.915 kg)  03/19/12 188 lb (85.276 kg)  01/29/12 187 lb (84.823 kg)   BP Readings from Last 3 Encounters:  04/29/12 148/90  03/19/12 127/77  01/29/12 140/90        Review of Systems  Constitutional: Negative for appetite change, fatigue and unexpected weight change.  HENT: Negative for nosebleeds, congestion, sore throat, sneezing, trouble swallowing and neck pain.   Eyes: Negative for itching and visual disturbance.  Respiratory: Negative for cough.   Cardiovascular: Negative for chest pain, palpitations and leg swelling.  Gastrointestinal: Negative for nausea, diarrhea, blood in stool and abdominal distention.  Genitourinary: Positive for frequency. Negative for hematuria.  Musculoskeletal: Positive for back pain. Negative for joint swelling and gait problem.  Skin: Negative for rash.  Neurological: Negative for dizziness, tremors, speech difficulty and weakness.  Psychiatric/Behavioral: Negative for disturbed wake/sleep cycle, dysphoric mood and agitation. The patient is not nervous/anxious.        Objective:   Physical Exam  Constitutional: He is oriented to person, place, and time. He appears well-developed.  HENT:  Mouth/Throat: Oropharynx is clear and moist.  Eyes: Conjunctivae are normal. Pupils are equal, round, and reactive to light.  Neck: Normal range of motion. No JVD present. No thyromegaly present.  Cardiovascular: Normal rate, regular rhythm, normal heart sounds and intact distal pulses.  Exam reveals no gallop and no friction rub.   No murmur heard. Pulmonary/Chest: Effort normal and breath sounds normal. No  respiratory distress. He has no wheezes. He has no rales. He exhibits no tenderness.  Abdominal: Soft. Bowel sounds are normal. He exhibits no distension and no mass. There is no tenderness. There is no rebound and no guarding.  Musculoskeletal: Normal range of motion. He exhibits tenderness (LS tender w/ROM). He exhibits no edema.  Lymphadenopathy:    He has no cervical adenopathy.  Neurological: He is alert and oriented to person, place, and time. He has normal reflexes. No cranial nerve deficit. He exhibits normal muscle tone. Coordination normal.  Skin: Skin is warm and dry. No rash noted.  Psychiatric: He has a normal mood and affect. His behavior is normal. Judgment and thought content normal.   Lab Results  Component Value Date   WBC 7.1 05/13/2011   HGB 14.2 05/13/2011   HCT 42.3 05/13/2011   PLT 177.0 05/13/2011   GLUCOSE 91 05/13/2011   CHOL 164 05/13/2011   TRIG 77.0 05/13/2011   HDL 39.00* 05/13/2011   LDLCALC 110* 05/13/2011   ALT 19 05/13/2011   AST 24 05/13/2011   NA 141 05/13/2011   K 4.5 05/13/2011   CL 108 05/13/2011   CREATININE 0.9 05/13/2011   BUN 15 05/13/2011   CO2 28 05/13/2011   TSH 2.01 05/13/2011   PSA 5.99* 11/13/2010   INR 2.4 RATIO* 09/17/2007   HGBA1C 5.8 04/24/2007         Assessment & Plan:

## 2012-04-29 NOTE — Assessment & Plan Note (Signed)
Continue with current prescription therapy as reflected on the Med list.  

## 2012-04-29 NOTE — Assessment & Plan Note (Signed)
Continue with Valerian root therapy prn as reflected on the Med list.

## 2012-04-29 NOTE — Assessment & Plan Note (Signed)
Continue with current prescription therapy as reflected on the Med list. Cont w/exercise

## 2012-05-01 ENCOUNTER — Telehealth: Payer: Self-pay | Admitting: *Deleted

## 2012-05-01 DIAGNOSIS — I10 Essential (primary) hypertension: Secondary | ICD-10-CM

## 2012-05-01 NOTE — Telephone Encounter (Signed)
Per pt was told to come back for appt in sept labs prior, Need to know what order & dx  to put in... 05/01/12@11 :46am/LMB

## 2012-05-01 NOTE — Telephone Encounter (Signed)
Done thx

## 2012-05-01 NOTE — Telephone Encounter (Signed)
Notified pt lab order in comp... 05/01/12@1 :15pm/LMB

## 2012-05-01 NOTE — Telephone Encounter (Signed)
Message copied by Deatra James on Fri May 01, 2012 11:45 AM ------      Message from: Etheleen Sia      Created: Wed Apr 29, 2012 10:02 AM      Regarding: LAB       WAS TOLD TO DO LABS PRIOR TO APPT IN SEPT.  NOT IN THE COMPUTER. CALL HIM TO LET HIM KNOW.      THANKS

## 2012-05-08 DIAGNOSIS — N138 Other obstructive and reflux uropathy: Secondary | ICD-10-CM | POA: Diagnosis not present

## 2012-05-08 DIAGNOSIS — N402 Nodular prostate without lower urinary tract symptoms: Secondary | ICD-10-CM | POA: Diagnosis not present

## 2012-05-08 DIAGNOSIS — N401 Enlarged prostate with lower urinary tract symptoms: Secondary | ICD-10-CM | POA: Diagnosis not present

## 2012-05-15 DIAGNOSIS — R972 Elevated prostate specific antigen [PSA]: Secondary | ICD-10-CM | POA: Diagnosis not present

## 2012-05-15 DIAGNOSIS — N402 Nodular prostate without lower urinary tract symptoms: Secondary | ICD-10-CM | POA: Diagnosis not present

## 2012-05-15 DIAGNOSIS — N318 Other neuromuscular dysfunction of bladder: Secondary | ICD-10-CM | POA: Diagnosis not present

## 2012-05-15 DIAGNOSIS — N401 Enlarged prostate with lower urinary tract symptoms: Secondary | ICD-10-CM | POA: Diagnosis not present

## 2012-06-04 ENCOUNTER — Ambulatory Visit: Payer: Medicare Other | Admitting: Internal Medicine

## 2012-06-05 ENCOUNTER — Encounter: Payer: Self-pay | Admitting: Internal Medicine

## 2012-06-05 ENCOUNTER — Telehealth: Payer: Self-pay | Admitting: Internal Medicine

## 2012-06-05 ENCOUNTER — Ambulatory Visit (INDEPENDENT_AMBULATORY_CARE_PROVIDER_SITE_OTHER): Payer: Medicare Other | Admitting: Internal Medicine

## 2012-06-05 ENCOUNTER — Ambulatory Visit (INDEPENDENT_AMBULATORY_CARE_PROVIDER_SITE_OTHER)
Admission: RE | Admit: 2012-06-05 | Discharge: 2012-06-05 | Disposition: A | Discharge status: Home / Self Care | Payer: Medicare Other | Source: Ambulatory Visit | Attending: Internal Medicine | Admitting: Internal Medicine

## 2012-06-05 VITALS — BP 148/90 | HR 68 | Temp 98.4°F | Resp 16 | Wt 188.0 lb

## 2012-06-05 DIAGNOSIS — M545 Low back pain, unspecified: Secondary | ICD-10-CM

## 2012-06-05 DIAGNOSIS — Z043 Encounter for examination and observation following other accident: Secondary | ICD-10-CM | POA: Diagnosis not present

## 2012-06-05 DIAGNOSIS — M542 Cervicalgia: Secondary | ICD-10-CM | POA: Diagnosis not present

## 2012-06-05 MED ORDER — IBUPROFEN 600 MG PO TABS: ORAL_TABLET | ORAL | Status: DC

## 2012-06-05 MED ORDER — IBUPROFEN 600 MG PO TABS: ORAL_TABLET | ORAL | Status: AC

## 2012-06-05 MED ORDER — CYCLOBENZAPRINE HCL 5 MG PO TABS: 5.0000 mg | ORAL_TABLET | Freq: Two times a day (BID) | ORAL | Status: DC | PRN

## 2012-06-05 MED ORDER — CYCLOBENZAPRINE HCL 5 MG PO TABS: 5.0000 mg | ORAL_TABLET | Freq: Two times a day (BID) | ORAL | Status: AC | PRN
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
## 2012-06-05 NOTE — Telephone Encounter (Signed)
Left detailed mess informing pt of below. Copies mailed.  
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
## 2012-06-05 NOTE — Telephone Encounter (Signed)
Roger Kent, please, inform patient that he has a lot of OA changes in his cervical and lumbar spine. Rx as we discussed  Please, mail the labs to the patient.    Thx

## 2012-06-06 ENCOUNTER — Encounter: Payer: Self-pay | Admitting: Internal Medicine
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
## 2012-06-24 ENCOUNTER — Encounter: Payer: Self-pay | Admitting: Internal Medicine

## 2012-06-24 ENCOUNTER — Ambulatory Visit (INDEPENDENT_AMBULATORY_CARE_PROVIDER_SITE_OTHER): Payer: Medicare Other | Admitting: Internal Medicine

## 2012-06-24 VITALS — BP 148/80 | HR 80 | Temp 98.5°F | Resp 16 | Wt 189.0 lb

## 2012-06-24 DIAGNOSIS — M545 Low back pain: Secondary | ICD-10-CM

## 2012-06-24 DIAGNOSIS — M542 Cervicalgia: Secondary | ICD-10-CM | POA: Diagnosis not present

## 2012-06-24 DIAGNOSIS — H9319 Tinnitus, unspecified ear: Secondary | ICD-10-CM

## 2012-06-24 DIAGNOSIS — I10 Essential (primary) hypertension: Secondary | ICD-10-CM | POA: Diagnosis not present

## 2012-06-24 DIAGNOSIS — H33039 Retinal detachment with giant retinal tear, unspecified eye: Secondary | ICD-10-CM

## 2012-06-24 NOTE — Assessment & Plan Note (Addendum)
Continue with current prescription therapy as reflected on the Med list. Massage therapy
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
## 2012-06-24 NOTE — Assessment & Plan Note (Signed)
Continue with current prescription therapy as reflected on the Med list.  
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
## 2012-06-28 ENCOUNTER — Encounter: Payer: Self-pay | Admitting: Internal Medicine

## 2012-07-06 ENCOUNTER — Encounter (INDEPENDENT_AMBULATORY_CARE_PROVIDER_SITE_OTHER): Payer: Medicare Other | Admitting: Ophthalmology

## 2012-07-06 DIAGNOSIS — H33009 Unspecified retinal detachment with retinal break, unspecified eye: Secondary | ICD-10-CM

## 2012-07-06 DIAGNOSIS — H35359 Cystoid macular degeneration, unspecified eye: Secondary | ICD-10-CM

## 2012-07-06 DIAGNOSIS — H43819 Vitreous degeneration, unspecified eye: Secondary | ICD-10-CM | POA: Diagnosis not present

## 2012-07-07 DIAGNOSIS — H9319 Tinnitus, unspecified ear: Secondary | ICD-10-CM | POA: Diagnosis not present

## 2012-07-07 DIAGNOSIS — B182 Chronic viral hepatitis C: Secondary | ICD-10-CM | POA: Diagnosis not present

## 2012-07-07 DIAGNOSIS — K6289 Other specified diseases of anus and rectum: Secondary | ICD-10-CM | POA: Diagnosis not present

## 2012-07-07 DIAGNOSIS — H9190 Unspecified hearing loss, unspecified ear: Secondary | ICD-10-CM | POA: Diagnosis not present

## 2012-07-14 DIAGNOSIS — K219 Gastro-esophageal reflux disease without esophagitis: Secondary | ICD-10-CM | POA: Diagnosis not present

## 2012-07-27 ENCOUNTER — Other Ambulatory Visit (INDEPENDENT_AMBULATORY_CARE_PROVIDER_SITE_OTHER): Payer: Medicare Other

## 2012-07-27 DIAGNOSIS — I10 Essential (primary) hypertension: Secondary | ICD-10-CM | POA: Diagnosis not present

## 2012-07-27 LAB — BASIC METABOLIC PANEL
CO2: 27 mEq/L (ref 19–32)
Chloride: 108 mEq/L (ref 96–112)
Glucose, Bld: 99 mg/dL (ref 70–99)
Sodium: 141 mEq/L (ref 135–145)

## 2012-08-04 ENCOUNTER — Encounter: Payer: Self-pay | Admitting: Internal Medicine

## 2012-08-04 ENCOUNTER — Ambulatory Visit (INDEPENDENT_AMBULATORY_CARE_PROVIDER_SITE_OTHER): Payer: Medicare Other | Admitting: Internal Medicine

## 2012-08-04 VITALS — BP 150/82 | HR 76 | Temp 98.2°F | Resp 16 | Wt 190.5 lb

## 2012-08-04 DIAGNOSIS — M545 Low back pain: Secondary | ICD-10-CM | POA: Diagnosis not present

## 2012-08-04 DIAGNOSIS — M542 Cervicalgia: Secondary | ICD-10-CM | POA: Diagnosis not present

## 2012-08-04 DIAGNOSIS — H9319 Tinnitus, unspecified ear: Secondary | ICD-10-CM | POA: Diagnosis not present

## 2012-08-04 DIAGNOSIS — I10 Essential (primary) hypertension: Secondary | ICD-10-CM

## 2012-08-04 DIAGNOSIS — Z23 Encounter for immunization: Secondary | ICD-10-CM

## 2012-08-04 DIAGNOSIS — F4323 Adjustment disorder with mixed anxiety and depressed mood: Secondary | ICD-10-CM | POA: Diagnosis not present

## 2012-08-04 MED ORDER — CYCLOBENZAPRINE HCL 5 MG PO TABS: 5.0000 mg | ORAL_TABLET | Freq: Two times a day (BID) | ORAL | Status: DC | PRN

## 2012-08-04 MED ORDER — GABAPENTIN 100 MG PO CAPS: 100.0000 mg | ORAL_CAPSULE | Freq: Three times a day (TID) | ORAL | Status: DC

## 2012-08-04 NOTE — Assessment & Plan Note (Signed)
Problems were discussed/addressed

## 2012-08-04 NOTE — Assessment & Plan Note (Signed)
Cont w/chiropactic treatments

## 2012-08-04 NOTE — Assessment & Plan Note (Signed)
Watching BP Cont to monitor at home

## 2012-08-04 NOTE — Assessment & Plan Note (Signed)
We will try Gabapentin 

## 2012-08-04 NOTE — Assessment & Plan Note (Signed)
We will try Gabapentin Biofeedback was advised Sound machine
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
## 2012-08-10 ENCOUNTER — Encounter (INDEPENDENT_AMBULATORY_CARE_PROVIDER_SITE_OTHER): Payer: Medicare Other | Admitting: Ophthalmology

## 2012-08-13 DIAGNOSIS — F4323 Adjustment disorder with mixed anxiety and depressed mood: Secondary | ICD-10-CM | POA: Diagnosis not present

## 2012-08-17 DIAGNOSIS — H9190 Unspecified hearing loss, unspecified ear: Secondary | ICD-10-CM | POA: Diagnosis not present

## 2012-08-17 DIAGNOSIS — H902 Conductive hearing loss, unspecified: Secondary | ICD-10-CM | POA: Diagnosis not present

## 2012-08-24 ENCOUNTER — Ambulatory Visit: Payer: Medicare Other | Admitting: Internal Medicine

## 2012-09-04 ENCOUNTER — Telehealth: Payer: Self-pay | Admitting: *Deleted

## 2012-09-04 DIAGNOSIS — M545 Low back pain: Secondary | ICD-10-CM

## 2012-09-04 NOTE — Telephone Encounter (Signed)
Pt is requesting Rf to a spine specialist.
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
## 2012-09-22 DIAGNOSIS — M546 Pain in thoracic spine: Secondary | ICD-10-CM | POA: Diagnosis not present

## 2012-09-22 DIAGNOSIS — M542 Cervicalgia: Secondary | ICD-10-CM | POA: Diagnosis not present

## 2012-09-28 DIAGNOSIS — Z85828 Personal history of other malignant neoplasm of skin: Secondary | ICD-10-CM | POA: Diagnosis not present

## 2012-09-28 DIAGNOSIS — L570 Actinic keratosis: Secondary | ICD-10-CM | POA: Diagnosis not present

## 2012-10-09 ENCOUNTER — Ambulatory Visit: Payer: Medicare Other | Admitting: Internal Medicine

## 2012-10-21 ENCOUNTER — Encounter: Payer: Self-pay | Admitting: Internal Medicine

## 2012-10-21 ENCOUNTER — Ambulatory Visit (INDEPENDENT_AMBULATORY_CARE_PROVIDER_SITE_OTHER): Payer: Medicare Other | Admitting: Internal Medicine

## 2012-10-21 VITALS — BP 150/90 | HR 80 | Temp 98.4°F | Resp 16 | Wt 195.0 lb

## 2012-10-21 DIAGNOSIS — M545 Low back pain, unspecified: Secondary | ICD-10-CM | POA: Diagnosis not present

## 2012-10-21 DIAGNOSIS — Z23 Encounter for immunization: Secondary | ICD-10-CM

## 2012-10-21 DIAGNOSIS — H9319 Tinnitus, unspecified ear: Secondary | ICD-10-CM

## 2012-10-21 DIAGNOSIS — I10 Essential (primary) hypertension: Secondary | ICD-10-CM

## 2012-10-21 DIAGNOSIS — M542 Cervicalgia: Secondary | ICD-10-CM | POA: Diagnosis not present

## 2012-10-21 MED ORDER — OLMESARTAN MEDOXOMIL 40 MG PO TABS: 40.0000 mg | ORAL_TABLET | Freq: Every day | ORAL | Status: DC

## 2012-10-21 NOTE — Assessment & Plan Note (Signed)
He has hearing aids

## 2012-10-21 NOTE — Assessment & Plan Note (Signed)
Improving Continue with current prescription therapy as reflected on the Med list. Cont chiropractic Rx. He completed PT
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
## 2012-10-21 NOTE — Assessment & Plan Note (Signed)
Improving Continue with current prescription therapy as reflected on the Med list. Cont chiropractic Rx. He completed PT 

## 2012-10-21 NOTE — Assessment & Plan Note (Signed)
BP Readings from Last 3 Encounters:  10/21/12 150/90  08/04/12 150/82  06/24/12 148/80   Start Benicar 1 a day - not 1/2

## 2012-10-22 ENCOUNTER — Other Ambulatory Visit: Payer: Self-pay | Admitting: Internal Medicine

## 2012-10-28 ENCOUNTER — Telehealth: Payer: Self-pay | Admitting: Internal Medicine

## 2012-10-28 NOTE — Telephone Encounter (Signed)
Patient calling to schedule visit for shingles vaccine.  States was told to see if it was in stock.  Insurance has been contacted and will cover the immunization.  Per staff, vaccine is available; caller transferred to front desk to schedule appt.  krs/[email or web address]

## 2012-10-29 ENCOUNTER — Ambulatory Visit (INDEPENDENT_AMBULATORY_CARE_PROVIDER_SITE_OTHER): Payer: Medicare Other | Admitting: *Deleted

## 2012-10-29 DIAGNOSIS — Z2911 Encounter for prophylactic immunotherapy for respiratory syncytial virus (RSV): Secondary | ICD-10-CM | POA: Diagnosis not present

## 2012-10-29 DIAGNOSIS — Z23 Encounter for immunization: Secondary | ICD-10-CM

## 2012-11-13 ENCOUNTER — Other Ambulatory Visit: Payer: Self-pay | Admitting: Internal Medicine

## 2013-01-06 ENCOUNTER — Ambulatory Visit (INDEPENDENT_AMBULATORY_CARE_PROVIDER_SITE_OTHER): Payer: Medicare Other | Admitting: Ophthalmology

## 2013-01-06 DIAGNOSIS — H43819 Vitreous degeneration, unspecified eye: Secondary | ICD-10-CM

## 2013-01-06 DIAGNOSIS — H33009 Unspecified retinal detachment with retinal break, unspecified eye: Secondary | ICD-10-CM

## 2013-01-18 ENCOUNTER — Other Ambulatory Visit (INDEPENDENT_AMBULATORY_CARE_PROVIDER_SITE_OTHER): Payer: Medicare Other

## 2013-01-18 DIAGNOSIS — M545 Low back pain: Secondary | ICD-10-CM | POA: Diagnosis not present

## 2013-01-18 DIAGNOSIS — I10 Essential (primary) hypertension: Secondary | ICD-10-CM | POA: Diagnosis not present

## 2013-01-18 DIAGNOSIS — M542 Cervicalgia: Secondary | ICD-10-CM

## 2013-01-18 DIAGNOSIS — H9319 Tinnitus, unspecified ear: Secondary | ICD-10-CM

## 2013-01-18 LAB — HEPATIC FUNCTION PANEL
ALT: 21 U/L (ref 0–53)
Albumin: 4.1 g/dL (ref 3.5–5.2)
Total Protein: 7.3 g/dL (ref 6.0–8.3)

## 2013-01-18 LAB — LIPID PANEL
Cholesterol: 138 mg/dL (ref 0–200)
HDL: 38.2 mg/dL — ABNORMAL LOW (ref >39.00)
LDL Cholesterol: 83 mg/dL (ref 0–99)
Triglycerides: 86 mg/dL (ref 0.0–149.0)
VLDL: 17.2 mg/dL (ref 0.0–40.0)

## 2013-01-18 LAB — BASIC METABOLIC PANEL
BUN: 16 mg/dL (ref 6–23)
Chloride: 107 mEq/L (ref 96–112)
GFR: 83.05 mL/min (ref >60.00)
Potassium: 4.2 mEq/L (ref 3.5–5.1)

## 2013-01-18 LAB — TSH: TSH: 1.98 u[IU]/mL (ref 0.35–5.50)

## 2013-01-21 ENCOUNTER — Ambulatory Visit (INDEPENDENT_AMBULATORY_CARE_PROVIDER_SITE_OTHER): Payer: Medicare Other | Admitting: Internal Medicine

## 2013-01-21 ENCOUNTER — Encounter: Payer: Self-pay | Admitting: Internal Medicine

## 2013-01-21 VITALS — BP 138/88 | HR 72 | Temp 98.1°F | Resp 16 | Wt 185.0 lb

## 2013-01-21 DIAGNOSIS — M545 Low back pain: Secondary | ICD-10-CM | POA: Diagnosis not present

## 2013-01-21 DIAGNOSIS — I10 Essential (primary) hypertension: Secondary | ICD-10-CM

## 2013-01-21 DIAGNOSIS — M542 Cervicalgia: Secondary | ICD-10-CM | POA: Diagnosis not present

## 2013-01-21 DIAGNOSIS — E785 Hyperlipidemia, unspecified: Secondary | ICD-10-CM

## 2013-01-21 DIAGNOSIS — F411 Generalized anxiety disorder: Secondary | ICD-10-CM | POA: Diagnosis not present
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
## 2013-01-21 NOTE — Assessment & Plan Note (Signed)
Continue with current prescription therapy as reflected on the Med list.  
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
## 2013-03-25 ENCOUNTER — Other Ambulatory Visit: Payer: Self-pay | Admitting: Cardiology

## 2013-03-30 ENCOUNTER — Other Ambulatory Visit: Payer: Self-pay | Admitting: Internal Medicine

## 2013-04-23 ENCOUNTER — Ambulatory Visit (INDEPENDENT_AMBULATORY_CARE_PROVIDER_SITE_OTHER): Payer: Medicare Other | Admitting: Internal Medicine

## 2013-04-23 ENCOUNTER — Encounter: Payer: Self-pay | Admitting: Internal Medicine

## 2013-04-23 VITALS — BP 150/92 | HR 76 | Temp 97.1°F | Resp 16 | Wt 187.0 lb

## 2013-04-23 DIAGNOSIS — M545 Low back pain, unspecified: Secondary | ICD-10-CM

## 2013-04-23 DIAGNOSIS — R5381 Other malaise: Secondary | ICD-10-CM

## 2013-04-23 DIAGNOSIS — R5383 Other fatigue: Secondary | ICD-10-CM

## 2013-04-23 DIAGNOSIS — R030 Elevated blood-pressure reading, without diagnosis of hypertension: Secondary | ICD-10-CM | POA: Diagnosis not present

## 2013-04-23 DIAGNOSIS — F411 Generalized anxiety disorder: Secondary | ICD-10-CM

## 2013-04-23 DIAGNOSIS — I10 Essential (primary) hypertension: Secondary | ICD-10-CM

## 2013-04-23 NOTE — Assessment & Plan Note (Signed)
Discussed.

## 2013-04-23 NOTE — Assessment & Plan Note (Signed)
Continue with current manipultion therapy as reflected on the Med list.
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
## 2013-04-23 NOTE — Assessment & Plan Note (Signed)
He can take/re-start Benicar at hs

## 2013-04-27 NOTE — Assessment & Plan Note (Signed)
Continue with current prescription therapy as reflected on the Med list.  

## 2013-04-27 NOTE — Assessment & Plan Note (Signed)
Watching Continue with current prescription therapy as reflected on the Med list.

## 2013-05-13 DIAGNOSIS — R972 Elevated prostate specific antigen [PSA]: Secondary | ICD-10-CM | POA: Diagnosis not present

## 2013-05-13 DIAGNOSIS — N401 Enlarged prostate with lower urinary tract symptoms: Secondary | ICD-10-CM | POA: Diagnosis not present

## 2013-05-13 DIAGNOSIS — N138 Other obstructive and reflux uropathy: Secondary | ICD-10-CM | POA: Diagnosis not present

## 2013-05-18 DIAGNOSIS — R351 Nocturia: Secondary | ICD-10-CM | POA: Diagnosis not present

## 2013-05-18 DIAGNOSIS — N318 Other neuromuscular dysfunction of bladder: Secondary | ICD-10-CM | POA: Diagnosis not present

## 2013-05-18 DIAGNOSIS — R972 Elevated prostate specific antigen [PSA]: Secondary | ICD-10-CM | POA: Diagnosis not present

## 2013-05-18 DIAGNOSIS — N401 Enlarged prostate with lower urinary tract symptoms: Secondary | ICD-10-CM | POA: Diagnosis not present

## 2013-05-20 DIAGNOSIS — K5730 Diverticulosis of large intestine without perforation or abscess without bleeding: Secondary | ICD-10-CM | POA: Diagnosis not present

## 2013-05-20 DIAGNOSIS — Z1211 Encounter for screening for malignant neoplasm of colon: Secondary | ICD-10-CM | POA: Diagnosis not present

## 2013-06-23 ENCOUNTER — Other Ambulatory Visit: Payer: Self-pay

## 2013-07-02 ENCOUNTER — Emergency Department (HOSPITAL_COMMUNITY)
Admission: EM | Admit: 2013-07-02 | Discharge: 2013-07-03 | Disposition: A | Discharge status: Home / Self Care | Payer: Medicare Other | Attending: Emergency Medicine | Admitting: Emergency Medicine

## 2013-07-02 DIAGNOSIS — N400 Enlarged prostate without lower urinary tract symptoms: Secondary | ICD-10-CM | POA: Diagnosis not present

## 2013-07-02 DIAGNOSIS — Z8669 Personal history of other diseases of the nervous system and sense organs: Secondary | ICD-10-CM | POA: Insufficient documentation

## 2013-07-02 DIAGNOSIS — Z8739 Personal history of other diseases of the musculoskeletal system and connective tissue: Secondary | ICD-10-CM | POA: Insufficient documentation

## 2013-07-02 DIAGNOSIS — Z86718 Personal history of other venous thrombosis and embolism: Secondary | ICD-10-CM | POA: Insufficient documentation

## 2013-07-02 DIAGNOSIS — Z87442 Personal history of urinary calculi: Secondary | ICD-10-CM | POA: Diagnosis not present

## 2013-07-02 DIAGNOSIS — Z7982 Long term (current) use of aspirin: Secondary | ICD-10-CM | POA: Diagnosis not present

## 2013-07-02 DIAGNOSIS — Z8619 Personal history of other infectious and parasitic diseases: Secondary | ICD-10-CM | POA: Diagnosis not present

## 2013-07-02 DIAGNOSIS — Z8719 Personal history of other diseases of the digestive system: Secondary | ICD-10-CM | POA: Insufficient documentation

## 2013-07-02 DIAGNOSIS — K219 Gastro-esophageal reflux disease without esophagitis: Secondary | ICD-10-CM | POA: Diagnosis not present

## 2013-07-02 DIAGNOSIS — E785 Hyperlipidemia, unspecified: Secondary | ICD-10-CM | POA: Insufficient documentation

## 2013-07-02 DIAGNOSIS — Y9389 Activity, other specified: Secondary | ICD-10-CM | POA: Insufficient documentation

## 2013-07-02 DIAGNOSIS — IMO0002 Reserved for concepts with insufficient information to code with codable children: Secondary | ICD-10-CM

## 2013-07-02 DIAGNOSIS — S61209A Unspecified open wound of unspecified finger without damage to nail, initial encounter: Secondary | ICD-10-CM | POA: Diagnosis not present

## 2013-07-02 DIAGNOSIS — I10 Essential (primary) hypertension: Secondary | ICD-10-CM | POA: Insufficient documentation

## 2013-07-02 DIAGNOSIS — Y929 Unspecified place or not applicable: Secondary | ICD-10-CM | POA: Insufficient documentation

## 2013-07-02 DIAGNOSIS — Z23 Encounter for immunization: Secondary | ICD-10-CM | POA: Insufficient documentation

## 2013-07-02 DIAGNOSIS — Z79899 Other long term (current) drug therapy: Secondary | ICD-10-CM | POA: Insufficient documentation

## 2013-07-02 DIAGNOSIS — Z87798 Personal history of other (corrected) congenital malformations: Secondary | ICD-10-CM | POA: Insufficient documentation

## 2013-07-02 DIAGNOSIS — Z87448 Personal history of other diseases of urinary system: Secondary | ICD-10-CM | POA: Insufficient documentation

## 2013-07-02 DIAGNOSIS — F419 Anxiety disorder, unspecified: Secondary | ICD-10-CM

## 2013-07-02 DIAGNOSIS — F411 Generalized anxiety disorder: Secondary | ICD-10-CM | POA: Diagnosis not present

## 2013-07-02 DIAGNOSIS — W268XXA Contact with other sharp object(s), not elsewhere classified, initial encounter: Secondary | ICD-10-CM | POA: Insufficient documentation

## 2013-07-02 NOTE — ED Provider Notes (Signed)
CSN: [id number]     Arrival date & time 07/02/13  2133 History     First MD Initiated Contact with Patient 07/02/13 2333     Chief Complaint  Patient presents with  . Finger laceration    (Consider location/radiation/quality/duration/timing/severity/associated sxs/prior Treatment) The history is provided by the patient and the spouse. No language interpreter was used.  Roger Kent is a 67 year old male with past medical history of hyperlipidemia, BPH, anxiety, esophageal reflux, Barrett's esophagus, insomnia, hypertension presenting to the ED, with wife, after finger laceration occurred at approximately 9:00 PM. Patient reported that while using the vegetable cutter he accidentally sliced the finger pad of the left index finger. Patient reported that the bleeding started instantaneously, reported that he placed his finger underneath cold water to wash and has been applying pressure for the past couple of hours. Patient reported that he feels a mild numbness to the tip of his finger without radiation. Patient is unsure of last tetanus shot. Denied chronic use of blood thinners, patient reported that he's been using baby aspirin, aspirin 81 mg daily for the past couple of days. Denied loss of sensation, tingling, headache, dizziness, chest pain, shortness of breath, difficulty breathing. PCP Dr. Estelle Grumbles  Past Medical History  Diagnosis Date  . Hyperlipidemia   . BPH (benign prostatic hyperplasia)   . Anxiety state, unspecified   . Hypertonicity of bladder   . Unspecified tinnitus   . Personal history of urinary calculi   . Esophageal reflux   . Acute hepatitis C without mention of hepatic coma   . Osteoporosis, unspecified   . Barrett's esophagus   . Hiatal hernia   . Insomnia   . DVT (deep venous thrombosis)   . Allergic rhinitis, cause unspecified   . Recent retinal detachment, partial, with giant tear   . Ventricular septal defect   . Elevated PSA     Dr Vernie Ammons  .  Hypertension    Past Surgical History  Procedure Laterality Date  . Retinal detachment surgery    . Knee surgery     Family History  Problem Relation Age of Onset  . Alcohol abuse Neg Hx   . Hypertension Other    History  Substance Use Topics  . Smoking status: Never Smoker   . Smokeless tobacco: Not on file  . Alcohol Use: No    Review of Systems  Constitutional: Negative for chills.  Eyes: Negative for visual disturbance.  Respiratory: Negative for chest tightness and shortness of breath.   Cardiovascular: Negative for chest pain.  Skin: Positive for wound.  Neurological: Negative for dizziness, weakness and headaches.  All other systems reviewed and are negative.    Allergies  Review of patient's allergies indicates no known allergies.  Home Medications   Current Outpatient Rx  Name  Route  Sig  Dispense  Refill  . aspirin 81 MG EC tablet   Oral   Take 81 mg by mouth every morning.          . cetirizine (ZYRTEC) 10 MG tablet   Oral   Take 10 mg by mouth as needed for allergies.          . cholecalciferol (VITAMIN D) 1000 UNITS tablet   Oral   Take 1,000 Units by mouth every morning.         Marland Kitchen esomeprazole (NEXIUM) 40 MG capsule   Oral   Take 40 mg by mouth daily before breakfast.         .  glucosamine-chondroitin 500-400 MG tablet   Oral   Take 1 tablet by mouth every morning.         . montelukast (SINGULAIR) 10 MG tablet   Oral   Take 10 mg by mouth daily as needed (allergies).         . Multiple Vitamin (MULTIVITAMIN WITH MINERALS) TABS tablet   Oral   Take 1 tablet by mouth every morning.         . olmesartan (BENICAR) 40 MG tablet   Oral   Take 20 mg by mouth every morning.          Marland Kitchen OVER THE COUNTER MEDICATION   Oral   Take 1 capsule by mouth every morning. OTC herbal supplement for prostate health         . silodosin (RAPAFLO) 8 MG CAPS capsule   Oral   Take 8 mg by mouth every evening.          BP 123/77   Pulse 63  Temp(Src) 98 F (36.7 C) (Oral)  Resp 19  SpO2 97% Physical Exam  Nursing note and vitals reviewed. Constitutional: He is oriented to person, place, and time. He appears well-developed and well-nourished. No distress.  HENT:  Head: Normocephalic and atraumatic.  Neck: Normal range of motion. Neck supple.  Cardiovascular: Normal rate, regular rhythm and normal heart sounds.  Exam reveals no friction rub.   No murmur heard. Pulses:      Radial pulses are 2+ on the right side, and 2+ on the left side.  Pulmonary/Chest: Effort normal and breath sounds normal. No respiratory distress. He has no wheezes. He has no rales.  Musculoskeletal: Normal range of motion. He exhibits tenderness.  Full range of motion to the left index finger-full flexion and extension without discomfort noted. Negative signs of tendon and deep tendon involvement.  Neurological: He is alert and oriented to person, place, and time. He exhibits normal muscle tone. Coordination normal.  Sensation intact to the left hand and digits with differentiation to sharp and dull touch upon palpation.  Skin: He is not diaphoretic.  Proximally 0.5 mm x 0.75 mm loss of tissue to the left index finger pad. Bleeding not controlled. Wound wrapped in gauze with pressure application. No flap noted to the left index finger pad for suture application.  Psychiatric: He has a normal mood and affect. His behavior is normal. Thought content normal.    ED Course   Procedures (including critical care time)  Medications  TDaP (BOOSTRIX) injection 0.5 mL (0.5 mL Intramuscular Given 07/03/13 0043)    Labs Reviewed - No data to display Dg Hand Complete Left  07/03/2013   *RADIOLOGY REPORT*  Clinical Data: 67 year old male with an index finger laceration. Query foreign body.  LEFT HAND - COMPLETE 3+ VIEW  Comparison: None.  Findings: Distal radius and ulna intact.  Carpal bone alignment within normal limits.  Advanced degenerative changes  at the left thumb metacarpal joint, with joint space loss, osteophytosis, and extensive sclerosis.  There is also subluxation at the first MTP joint.  Metacarpals are intact.  Joint space loss with osteophytosis and subluxation at the third DIP.  Phalanges and the second finger appear intact.  No subcutaneous gas. No radiopaque foreign body identified.  There is a small chronic-appearing ossific fragment adjacent to the tuft of the fourth distal phalanx.  IMPRESSION: 1. No radiopaque foreign body identified.  No acute fracture or dislocation identified. 2.  Advanced degenerative changes with associated subluxation in  the first ray and third DIP.   Original Report Authenticated By: Erskine Speed, M.D.   1. Laceration   2. HLD (hyperlipidemia)   3. BPH (benign prostatic hyperplasia)   4. Anxiety     MDM  Patient presenting to the emergency department with laceration to the pad of the left index finger. Patient is unsure of last tetanus shot. Alert and oriented. In no acute distress. Pulses palpable bilaterally. Approximately 0.5 mm x 0.75 mm loss of tissue to the left index finger finger pad, mild discomfort upon palpation, bleeding not controlled upon arrival to the emergency department-patient placed in pressure bandage. Full range of motion noted to the left index finger, negative signs of tendon and deep tendon involvement. Sensation intact. Negative signs of neurovascular damage noted. Imaging of the left hand negative for foreign bodies, negative for acute fracture dislocation. Tourniquet placed on patient with application of wound stopper for scab to be produced. No flap present for sutures to be placed. Bleeding controlled in ED setting. Scab formed with application of wound stopper. Patient stable, afebrile. Discharged patient with instructions on proper wound care. Discussed with patient to not apply water to site, to keep site dry for 24 hours to enable proper coagulation with wound stopper to be  formed. Referred patient to PCP to be re-evaluated on Monday, 07/05/2013. Discussed with patient that if bleeding is to start again to report back to the ED. Discussed with patient to continue to monitor symptoms and if symptoms are to worsen or change to report back to the ED - strict return instructions given.  Patient agreed to plan of care, understood, all questions answered.   Raymon Mutton, PA-C 07/03/13 [phone]

## 2013-07-02 NOTE — ED Notes (Signed)
Pt states he was using a slicer on vegetables and sliced off part of his L index finger. Bleeding controlled with pressure. Pressure dressing applied in triage. Pt is not sure when he had his last tetanus shot. Pt ambulatory to triage with steady gait.

## 2013-07-03 ENCOUNTER — Emergency Department (HOSPITAL_COMMUNITY): Payer: Medicare Other

## 2013-07-03 DIAGNOSIS — S61209A Unspecified open wound of unspecified finger without damage to nail, initial encounter: Secondary | ICD-10-CM | POA: Diagnosis not present

## 2013-07-03 MED ORDER — TETANUS-DIPHTH-ACELL PERTUSSIS 5-2.5-18.5 LF-MCG/0.5 IM SUSP
0.5000 mL | Freq: Once | INTRAMUSCULAR | Status: AC
  Administered 2013-07-03: 0.5 mL via INTRAMUSCULAR
  Filled 2013-07-03: qty 0.5

## 2013-07-03 NOTE — ED Provider Notes (Signed)
Medical screening examination/treatment/procedure(s) were performed by non-physician practitioner and as supervising physician I was immediately available for consultation/collaboration.  Charron Coultas M Ernestyne Caldwell, MD 07/03/13 0745 

## 2013-07-05 ENCOUNTER — Encounter: Payer: Self-pay | Admitting: Internal Medicine

## 2013-07-05 ENCOUNTER — Ambulatory Visit (INDEPENDENT_AMBULATORY_CARE_PROVIDER_SITE_OTHER): Payer: Medicare Other | Admitting: Internal Medicine

## 2013-07-05 VITALS — BP 112/70 | HR 75 | Temp 98.3°F | Resp 10 | Wt 193.6 lb

## 2013-07-05 DIAGNOSIS — S61219D Laceration without foreign body of unspecified finger without damage to nail, subsequent encounter: Secondary | ICD-10-CM

## 2013-07-05 DIAGNOSIS — Z5189 Encounter for other specified aftercare: Secondary | ICD-10-CM

## 2013-07-05 NOTE — Progress Notes (Signed)
  Subjective:    Patient ID: Roger Kent, male    DOB: [birth year], 67 y.o.   MRN: [id number]  HPI  C/o R index finger laceration a few days ago   Review of Systems  Constitutional: Negative for fever.  Skin: Negative for rash.       Objective:   Physical Exam  Skin:  R index finger w/granulating wound < 1x1 cm    >20 min     Assessment & Plan:

## 2013-07-08 DIAGNOSIS — S61219A Laceration without foreign body of unspecified finger without damage to nail, initial encounter: Secondary | ICD-10-CM | POA: Insufficient documentation

## 2013-07-08 NOTE — Assessment & Plan Note (Addendum)
8/14 R index distal palmar phalanx Wound cleaned and re-dressed

## 2013-07-27 ENCOUNTER — Ambulatory Visit: Payer: Medicare Other | Admitting: Internal Medicine

## 2013-08-17 ENCOUNTER — Ambulatory Visit (INDEPENDENT_AMBULATORY_CARE_PROVIDER_SITE_OTHER): Payer: Medicare Other

## 2013-08-17 DIAGNOSIS — Z23 Encounter for immunization: Secondary | ICD-10-CM

## 2013-09-29 ENCOUNTER — Other Ambulatory Visit: Payer: Self-pay | Admitting: *Deleted

## 2013-09-29 DIAGNOSIS — Z85828 Personal history of other malignant neoplasm of skin: Secondary | ICD-10-CM | POA: Diagnosis not present

## 2013-09-29 DIAGNOSIS — D235 Other benign neoplasm of skin of trunk: Secondary | ICD-10-CM | POA: Diagnosis not present

## 2013-09-29 DIAGNOSIS — L821 Other seborrheic keratosis: Secondary | ICD-10-CM | POA: Diagnosis not present

## 2013-09-29 MED ORDER — OLMESARTAN MEDOXOMIL 40 MG PO TABS: 20.0000 mg | ORAL_TABLET | Freq: Every morning | ORAL | Status: DC

## 2013-09-30 ENCOUNTER — Other Ambulatory Visit: Payer: Self-pay | Admitting: Internal Medicine

## 2013-10-19 ENCOUNTER — Ambulatory Visit (INDEPENDENT_AMBULATORY_CARE_PROVIDER_SITE_OTHER): Payer: Medicare Other | Admitting: Internal Medicine

## 2013-10-19 ENCOUNTER — Other Ambulatory Visit: Payer: Self-pay | Admitting: *Deleted

## 2013-10-19 ENCOUNTER — Encounter: Payer: Self-pay | Admitting: Internal Medicine

## 2013-10-19 VITALS — BP 150/90 | HR 80 | Temp 98.6°F | Resp 16 | Wt 201.0 lb

## 2013-10-19 DIAGNOSIS — IMO0002 Reserved for concepts with insufficient information to code with codable children: Secondary | ICD-10-CM | POA: Diagnosis not present

## 2013-10-19 MED ORDER — MUPIROCIN 2 % EX OINT: TOPICAL_OINTMENT | CUTANEOUS | Status: DC

## 2013-10-19 MED ORDER — DOXYCYCLINE HYCLATE 100 MG PO TABS: 100.0000 mg | ORAL_TABLET | Freq: Two times a day (BID) | ORAL | Status: DC

## 2013-10-19 MED ORDER — TRAMADOL HCL 50 MG PO TABS: 50.0000 mg | ORAL_TABLET | Freq: Two times a day (BID) | ORAL | Status: DC | PRN

## 2013-10-19 NOTE — Progress Notes (Signed)
Pre visit review using our clinic review tool, if applicable. No additional management support is needed unless otherwise documented below in the visit note. 

## 2013-10-19 NOTE — Patient Instructions (Signed)
    Wound instructions provided.   Wound instructions : change dressing once a day or twice a day is needed. Change dressing after  shower in the morning.  Pat dry the wound with gauze. Pull out one inch of packing everyday and cut it off. Re-dress wound with antibiotic ointment and Telfa pad or a Band-Aid of appropriate size.   Please contact us if you notice a recollection of pus in the abscess fever and chills increased pain redness red streaks near the abscess increased swelling in the area.  

## 2013-10-19 NOTE — Assessment & Plan Note (Signed)
I&D Coban Doxy x 10 d, Bactroban, Tramadol prn
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
## 2013-10-19 NOTE — Telephone Encounter (Signed)
A user error has taken place.

## 2013-10-20 ENCOUNTER — Other Ambulatory Visit: Payer: Self-pay | Admitting: *Deleted

## 2013-10-20 MED ORDER — DOXYCYCLINE HYCLATE 100 MG PO TABS: 100.0000 mg | ORAL_TABLET | Freq: Two times a day (BID) | ORAL | Status: DC

## 2013-11-09 ENCOUNTER — Encounter: Payer: Self-pay | Admitting: Internal Medicine

## 2013-11-09 ENCOUNTER — Other Ambulatory Visit (INDEPENDENT_AMBULATORY_CARE_PROVIDER_SITE_OTHER): Payer: Medicare Other

## 2013-11-09 ENCOUNTER — Other Ambulatory Visit: Payer: Self-pay | Admitting: *Deleted

## 2013-11-09 ENCOUNTER — Ambulatory Visit (INDEPENDENT_AMBULATORY_CARE_PROVIDER_SITE_OTHER): Payer: Medicare Other | Admitting: Internal Medicine

## 2013-11-09 VITALS — BP 160/92 | HR 80 | Temp 97.9°F | Resp 16 | Wt 191.0 lb

## 2013-11-09 DIAGNOSIS — Z23 Encounter for immunization: Secondary | ICD-10-CM

## 2013-11-09 DIAGNOSIS — I10 Essential (primary) hypertension: Secondary | ICD-10-CM

## 2013-11-09 DIAGNOSIS — IMO0002 Reserved for concepts with insufficient information to code with codable children: Secondary | ICD-10-CM

## 2013-11-09 DIAGNOSIS — M542 Cervicalgia: Secondary | ICD-10-CM | POA: Diagnosis not present

## 2013-11-09 DIAGNOSIS — R5383 Other fatigue: Secondary | ICD-10-CM

## 2013-11-09 DIAGNOSIS — R5381 Other malaise: Secondary | ICD-10-CM | POA: Diagnosis not present

## 2013-11-09 LAB — BASIC METABOLIC PANEL
BUN: 18 mg/dL (ref 6–23)
CO2: 30 mEq/L (ref 19–32)
Calcium: 9 mg/dL (ref 8.4–10.5)
Creatinine, Ser: 1.2 mg/dL (ref 0.4–1.5)

## 2013-11-09 LAB — TSH: TSH: 1.53 u[IU]/mL (ref 0.35–5.50)
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
## 2013-11-09 NOTE — Assessment & Plan Note (Signed)
Continue with current prescription therapy as reflected on the Med list.  

## 2013-11-09 NOTE — Assessment & Plan Note (Signed)
Better  

## 2013-11-09 NOTE — Progress Notes (Signed)
Pre visit review using our clinic review tool, if applicable. No additional management support is needed unless otherwise documented below in the visit note. 

## 2013-11-09 NOTE — Assessment & Plan Note (Signed)
Resolved

## 2013-11-27 ENCOUNTER — Encounter: Payer: Self-pay | Admitting: Internal Medicine

## 2013-11-27 ENCOUNTER — Ambulatory Visit (INDEPENDENT_AMBULATORY_CARE_PROVIDER_SITE_OTHER): Payer: Medicare Other | Admitting: Internal Medicine

## 2013-11-27 VITALS — BP 160/90 | HR 89 | Temp 97.8°F | Resp 20 | Wt 193.0 lb

## 2013-11-27 DIAGNOSIS — J209 Acute bronchitis, unspecified: Secondary | ICD-10-CM

## 2013-11-27 DIAGNOSIS — I10 Essential (primary) hypertension: Secondary | ICD-10-CM

## 2013-11-27 DIAGNOSIS — F411 Generalized anxiety disorder: Secondary | ICD-10-CM | POA: Diagnosis not present

## 2013-11-27 MED ORDER — AZITHROMYCIN 250 MG PO TABS: ORAL_TABLET | ORAL | Status: DC

## 2013-11-27 NOTE — Progress Notes (Signed)
Pre-visit discussion using our clinic review tool. No additional management support is needed unless otherwise documented below in the visit note.  

## 2013-11-27 NOTE — Patient Instructions (Signed)
Please take all new medication as prescribed - the antibiotic  You can also take Delsym OTC for cough, and/or Mucinex (or it's generic off brand) for congestion, and tylenol as needed for pain.  Please remember to sign up for My Chart if you have not done so, as this will be important to you in the future with finding out test results, communicating by private email, and scheduling acute appointments online when needed.

## 2013-11-27 NOTE — Progress Notes (Signed)
Subjective:    Patient ID: Roger Kent, male    DOB: Apr 17, 1946, 69 y.o.   MRN: [id number]  HPI  Here with acute onset mild to mod 2-3 days ST, HA, general weakness and malaise, with prod cough greenish sputum, but Pt denies chest pain, increased sob or doe, wheezing, orthopnea, PND, increased LE swelling, palpitations, dizziness or syncope.  Does have several wks ongoing nasal allergy symptoms with clearish congestion, itch and sneezing, without fever, pain, ST, cough, swelling or wheezing.  Has recurring rhinitis with cold air as well. Denies worsening depressive symptoms, suicidal ideation, or panic Past Medical History  Diagnosis Date  . Hyperlipidemia   . BPH (benign prostatic hyperplasia)   . Anxiety state, unspecified   . Hypertonicity of bladder   . Unspecified tinnitus   . Personal history of urinary calculi   . Esophageal reflux   . Acute hepatitis C without mention of hepatic coma   . Osteoporosis, unspecified   . Barrett's esophagus   . Hiatal hernia   . Insomnia   . DVT (deep venous thrombosis)   . Allergic rhinitis, cause unspecified   . Recent retinal detachment, partial, with giant tear   . Ventricular septal defect   . Elevated PSA     Dr Karsten Ro  . Hypertension    Past Surgical History  Procedure Laterality Date  . Retinal detachment surgery    . Knee surgery      reports that he has never smoked. He does not have any smokeless tobacco history on file. He reports that he does not drink alcohol or use illicit drugs. family history includes Hypertension in his other. There is no history of Alcohol abuse. No Known Allergies Current Outpatient Prescriptions on File Prior to Visit  Medication Sig Dispense Refill  . aspirin 81 MG EC tablet Take 81 mg by mouth every morning.       . cetirizine (ZYRTEC) 10 MG tablet Take 10 mg by mouth as needed for allergies.       . cholecalciferol (VITAMIN D) 1000 UNITS tablet Take 1,000 Units by mouth every morning.        Marland Kitchen esomeprazole (NEXIUM) 40 MG capsule Take 40 mg by mouth daily before breakfast.      . glucosamine-chondroitin 500-400 MG tablet Take 1 tablet by mouth every morning.      Marland Kitchen ibuprofen (ADVIL,MOTRIN) 200 MG tablet Take 400 mg by mouth every morning.      . Ibuprofen-Diphenhydramine HCl (ADVIL PM) 200-25 MG CAPS Take 2 capsules by mouth at bedtime.      . mirabegron ER (MYRBETRIQ) 50 MG TB24 tablet Take 50 mg by mouth 2 (two) times daily.       . montelukast (SINGULAIR) 10 MG tablet Take 10 mg by mouth daily as needed (allergies).      . Multiple Vitamin (MULTIVITAMIN WITH MINERALS) TABS tablet Take 1 tablet by mouth every morning.      . mupirocin ointment (BACTROBAN) 2 % Use qd or bid w/dressing changes  30 g  0  . naproxen sodium (ANAPROX) 220 MG tablet Take 220 mg by mouth 2 (two) times daily with a meal.      . olmesartan (BENICAR) 40 MG tablet TAKE 1/2 TABLET DAILY      . omeprazole-sodium bicarbonate (ZEGERID) 40-1100 MG per capsule Take 1 capsule by mouth daily before breakfast.      . OVER THE COUNTER MEDICATION Take 1 capsule by mouth every morning. OTC herbal supplement  for prostate health      . traMADol (ULTRAM) 50 MG tablet Take 1-2 tablets (50-100 mg total) by mouth 2 (two) times daily as needed.  20 tablet  0   No current facility-administered medications on file prior to visit.   Review of Systems  Constitutional: Negative for unexpected weight change, or unusual diaphoresis  HENT: Negative for tinnitus.   Eyes: Negative for photophobia and visual disturbance.  Respiratory: Negative for choking and stridor.   Gastrointestinal: Negative for vomiting and blood in stool.  Genitourinary: Negative for hematuria and decreased urine volume.  Musculoskeletal: Negative for acute joint swelling Skin: Negative for color change and wound.  Neurological: Negative for tremors and numbness other than noted  Psychiatric/Behavioral: Negative for decreased concentration or  hyperactivity.        Objective:   Physical Exam BP 160/90  Pulse 89  Temp(Src) 97.8 F (36.6 C) (Oral)  Resp 20  Wt 193 lb (87.544 kg)  SpO2 97% VS noted, mild ill Constitutional: Pt appears well-developed and well-nourished.  HENT: Head: NCAT.  Right Ear: External ear normal.  Left Ear: External ear normal.  Bilat tm's with mild erythema.  Max sinus areas mild tender.  Pharynx with mild erythema, no exudate Eyes: Conjunctivae and EOM are normal. Pupils are equal, round, and reactive to light.  Neck: Normal range of motion. Neck supple.  Cardiovascular: Normal rate and regular rhythm.   Pulmonary/Chest: Effort normal and breath sounds mild decr, no rales or wheezing.  Neurological: Pt is alert. Not confused  Skin: Skin is warm. No erythema.  Psychiatric: Pt behavior is normal. Thought content normal. mild nervous, not depressed affect    Assessment & Plan:

## 2013-11-28 DIAGNOSIS — J209 Acute bronchitis, unspecified: Secondary | ICD-10-CM | POA: Insufficient documentation
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
## 2013-11-28 NOTE — Assessment & Plan Note (Signed)
Mild to mod, for antibx course,  to f/u any worsening symptoms or concerns 

## 2013-11-28 NOTE — Assessment & Plan Note (Signed)
Declines further tx, to f/u any worsening symptoms or concerns 

## 2014-01-06 ENCOUNTER — Ambulatory Visit (INDEPENDENT_AMBULATORY_CARE_PROVIDER_SITE_OTHER): Payer: Medicare Other | Admitting: Ophthalmology

## 2014-01-06 DIAGNOSIS — I10 Essential (primary) hypertension: Secondary | ICD-10-CM | POA: Diagnosis not present

## 2014-01-06 DIAGNOSIS — H35039 Hypertensive retinopathy, unspecified eye: Secondary | ICD-10-CM

## 2014-01-06 DIAGNOSIS — H33009 Unspecified retinal detachment with retinal break, unspecified eye: Secondary | ICD-10-CM

## 2014-01-06 DIAGNOSIS — H43819 Vitreous degeneration, unspecified eye: Secondary | ICD-10-CM | POA: Diagnosis not present

## 2014-02-07 ENCOUNTER — Encounter: Payer: Self-pay | Admitting: Internal Medicine

## 2014-02-07 ENCOUNTER — Ambulatory Visit (INDEPENDENT_AMBULATORY_CARE_PROVIDER_SITE_OTHER): Payer: Medicare Other | Admitting: Internal Medicine

## 2014-02-07 VITALS — BP 145/85 | HR 80 | Temp 98.2°F | Resp 16 | Wt 196.0 lb

## 2014-02-07 DIAGNOSIS — M545 Low back pain, unspecified: Secondary | ICD-10-CM | POA: Diagnosis not present

## 2014-02-07 DIAGNOSIS — M542 Cervicalgia: Secondary | ICD-10-CM

## 2014-02-07 DIAGNOSIS — Z0000 Encounter for general adult medical examination without abnormal findings: Secondary | ICD-10-CM | POA: Diagnosis not present

## 2014-02-07 DIAGNOSIS — I10 Essential (primary) hypertension: Secondary | ICD-10-CM | POA: Diagnosis not present

## 2014-02-07 DIAGNOSIS — R5383 Other fatigue: Secondary | ICD-10-CM

## 2014-02-07 DIAGNOSIS — R5381 Other malaise: Secondary | ICD-10-CM

## 2014-02-07 DIAGNOSIS — N320 Bladder-neck obstruction: Secondary | ICD-10-CM

## 2014-02-07 DIAGNOSIS — E785 Hyperlipidemia, unspecified: Secondary | ICD-10-CM

## 2014-02-07 DIAGNOSIS — R635 Abnormal weight gain: Secondary | ICD-10-CM
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
## 2014-02-07 NOTE — Assessment & Plan Note (Signed)
Continue with current prescription therapy as reflected on the Med list.  

## 2014-02-07 NOTE — Assessment & Plan Note (Signed)
Cont w/adjusments

## 2014-02-07 NOTE — Assessment & Plan Note (Signed)
Dr Reva Bores - "decompressions"

## 2014-02-07 NOTE — Progress Notes (Signed)
Pre visit review using our clinic review tool, if applicable. No additional management support is needed unless otherwise documented below in the visit note. 

## 2014-02-08 ENCOUNTER — Telehealth: Payer: Self-pay | Admitting: Internal Medicine

## 2014-02-08 NOTE — Telephone Encounter (Signed)
Relevant patient education assigned to patient using Emmi. ° °

## 2014-03-30 ENCOUNTER — Other Ambulatory Visit: Payer: Self-pay | Admitting: Internal Medicine

## 2014-04-14 ENCOUNTER — Other Ambulatory Visit: Payer: Self-pay | Admitting: Cardiology

## 2014-04-14 NOTE — Telephone Encounter (Signed)
I called and left patient VM he needed an appointment with Dr Stanford Breed, he hasn't been here since 2013, I have ask that he call refill room and let us know he has made this appointment.

## 2014-04-14 NOTE — Telephone Encounter (Signed)
Can give refill but needs a follow up appt. Thanks for all you do.

## 2014-04-14 NOTE — Telephone Encounter (Signed)
Dr Stanford Breed, last visit with you 03/19/2012, pharmacy is requesting refill on amoxicillin for dental appointment.

## 2014-05-11 DIAGNOSIS — N139 Obstructive and reflux uropathy, unspecified: Secondary | ICD-10-CM | POA: Diagnosis not present

## 2014-05-11 DIAGNOSIS — N401 Enlarged prostate with lower urinary tract symptoms: Secondary | ICD-10-CM | POA: Diagnosis not present

## 2014-05-11 DIAGNOSIS — N138 Other obstructive and reflux uropathy: Secondary | ICD-10-CM | POA: Diagnosis not present

## 2014-05-11 DIAGNOSIS — N318 Other neuromuscular dysfunction of bladder: Secondary | ICD-10-CM | POA: Diagnosis not present

## 2014-05-11 DIAGNOSIS — R351 Nocturia: Secondary | ICD-10-CM | POA: Diagnosis not present

## 2014-05-18 DIAGNOSIS — N139 Obstructive and reflux uropathy, unspecified: Secondary | ICD-10-CM | POA: Diagnosis not present

## 2014-05-18 DIAGNOSIS — R972 Elevated prostate specific antigen [PSA]: Secondary | ICD-10-CM | POA: Diagnosis not present

## 2014-05-18 DIAGNOSIS — N401 Enlarged prostate with lower urinary tract symptoms: Secondary | ICD-10-CM | POA: Diagnosis not present

## 2014-05-31 ENCOUNTER — Other Ambulatory Visit (INDEPENDENT_AMBULATORY_CARE_PROVIDER_SITE_OTHER): Payer: Medicare Other

## 2014-05-31 DIAGNOSIS — R5383 Other fatigue: Secondary | ICD-10-CM | POA: Diagnosis not present

## 2014-05-31 DIAGNOSIS — R5381 Other malaise: Secondary | ICD-10-CM | POA: Diagnosis not present

## 2014-05-31 DIAGNOSIS — E785 Hyperlipidemia, unspecified: Secondary | ICD-10-CM | POA: Diagnosis not present

## 2014-05-31 DIAGNOSIS — M545 Low back pain, unspecified: Secondary | ICD-10-CM

## 2014-05-31 DIAGNOSIS — I10 Essential (primary) hypertension: Secondary | ICD-10-CM | POA: Diagnosis not present

## 2014-05-31 DIAGNOSIS — N320 Bladder-neck obstruction: Secondary | ICD-10-CM

## 2014-05-31 DIAGNOSIS — R635 Abnormal weight gain: Secondary | ICD-10-CM | POA: Diagnosis not present

## 2014-05-31 DIAGNOSIS — Z0000 Encounter for general adult medical examination without abnormal findings: Secondary | ICD-10-CM | POA: Diagnosis not present

## 2014-05-31 LAB — BASIC METABOLIC PANEL
BUN: 20 mg/dL (ref 6–23)
CHLORIDE: 105 meq/L (ref 96–112)
CO2: 26 meq/L (ref 19–32)
Calcium: 9.3 mg/dL (ref 8.4–10.5)
Creatinine, Ser: 1.1 mg/dL (ref 0.4–1.5)
GFR: 74.59 mL/min (ref >60.00)
GLUCOSE: 117 mg/dL — AB (ref 70–99)
Potassium: 4.3 mEq/L (ref 3.5–5.1)
SODIUM: 139 meq/L (ref 135–145)

## 2014-05-31 LAB — URINALYSIS, ROUTINE W REFLEX MICROSCOPIC
Bilirubin Urine: NEGATIVE
HGB URINE DIPSTICK: NEGATIVE
Ketones, ur: NEGATIVE
NITRITE: NEGATIVE
Specific Gravity, Urine: 1.015 (ref 1.000–1.030)
TOTAL PROTEIN, URINE-UPE24: NEGATIVE
Urine Glucose: NEGATIVE
Urobilinogen, UA: 0.2 (ref 0.0–1.0)
pH: 6 (ref 5.0–8.0)

## 2014-05-31 LAB — LIPID PANEL
CHOL/HDL RATIO: 4
Cholesterol: 156 mg/dL (ref 0–200)
HDL: 35.7 mg/dL — ABNORMAL LOW (ref >39.00)
LDL Cholesterol: 104 mg/dL — ABNORMAL HIGH (ref 0–99)
NONHDL: 120.3
Triglycerides: 81 mg/dL (ref 0.0–149.0)
VLDL: 16.2 mg/dL (ref 0.0–40.0)

## 2014-05-31 LAB — HEPATIC FUNCTION PANEL
ALK PHOS: 48 U/L (ref 39–117)
ALT: 17 U/L (ref 0–53)
AST: 15 U/L (ref 0–37)
Albumin: 4.4 g/dL (ref 3.5–5.2)
BILIRUBIN DIRECT: 0.1 mg/dL (ref 0.0–0.3)
TOTAL PROTEIN: 7.1 g/dL (ref 6.0–8.3)
Total Bilirubin: 1 mg/dL (ref 0.2–1.2)

## 2014-05-31 LAB — CBC WITH DIFFERENTIAL/PLATELET
Basophils Absolute: 0 10*3/uL (ref 0.0–0.1)
Basophils Relative: 0.4 % (ref 0.0–3.0)
EOS PCT: 2.5 % (ref 0.0–5.0)
Eosinophils Absolute: 0.2 10*3/uL (ref 0.0–0.7)
HCT: 42.8 % (ref 39.0–52.0)
Hemoglobin: 14 g/dL (ref 13.0–17.0)
LYMPHS PCT: 31.5 % (ref 12.0–46.0)
Lymphs Abs: 2.4 10*3/uL (ref 0.7–4.0)
MCHC: 32.8 g/dL (ref 30.0–36.0)
MCV: 93.7 fl (ref 78.0–100.0)
MONO ABS: 0.7 10*3/uL (ref 0.1–1.0)
MONOS PCT: 9 % (ref 3.0–12.0)
NEUTROS PCT: 56.6 % (ref 43.0–77.0)
Neutro Abs: 4.3 10*3/uL (ref 1.4–7.7)
PLATELETS: 214 10*3/uL (ref 150.0–400.0)
RBC: 4.57 Mil/uL (ref 4.22–5.81)
RDW: 14.7 % (ref 11.5–15.5)
WBC: 7.6 10*3/uL (ref 4.0–10.5)

## 2014-05-31 LAB — PSA: PSA: 10.03 ng/mL — AB (ref 0.10–4.00)

## 2014-05-31 LAB — TSH: TSH: 2.06 u[IU]/mL (ref 0.35–4.50)

## 2014-06-14 ENCOUNTER — Encounter: Payer: Medicare Other | Admitting: Internal Medicine

## 2014-06-21 ENCOUNTER — Encounter: Payer: Self-pay | Admitting: Internal Medicine

## 2014-06-21 ENCOUNTER — Telehealth: Payer: Self-pay | Admitting: Internal Medicine

## 2014-06-21 ENCOUNTER — Ambulatory Visit (INDEPENDENT_AMBULATORY_CARE_PROVIDER_SITE_OTHER): Payer: Medicare Other | Admitting: Internal Medicine

## 2014-06-21 VITALS — BP 140/90 | HR 73 | Temp 98.5°F | Ht 73.0 in | Wt 182.8 lb

## 2014-06-21 DIAGNOSIS — Z0000 Encounter for general adult medical examination without abnormal findings: Secondary | ICD-10-CM | POA: Insufficient documentation

## 2014-06-21 DIAGNOSIS — R7309 Other abnormal glucose: Secondary | ICD-10-CM

## 2014-06-21 DIAGNOSIS — I10 Essential (primary) hypertension: Secondary | ICD-10-CM

## 2014-06-21 DIAGNOSIS — I82409 Acute embolism and thrombosis of unspecified deep veins of unspecified lower extremity: Secondary | ICD-10-CM | POA: Diagnosis not present

## 2014-06-21 DIAGNOSIS — R739 Hyperglycemia, unspecified: Secondary | ICD-10-CM

## 2014-06-21 DIAGNOSIS — F411 Generalized anxiety disorder: Secondary | ICD-10-CM

## 2014-06-21 DIAGNOSIS — E785 Hyperlipidemia, unspecified: Secondary | ICD-10-CM

## 2014-06-21 DIAGNOSIS — Z23 Encounter for immunization: Secondary | ICD-10-CM

## 2014-06-21 DIAGNOSIS — R972 Elevated prostate specific antigen [PSA]: Secondary | ICD-10-CM

## 2014-06-21 LAB — GLUCOSE, POCT (MANUAL RESULT ENTRY): POC Glucose: 91 mg/dl (ref 70–99)

## 2014-06-21 NOTE — Assessment & Plan Note (Signed)
Labs

## 2014-06-21 NOTE — Patient Instructions (Signed)
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
## 2014-06-21 NOTE — Assessment & Plan Note (Signed)
Chronic  Dr Karsten Ro q 12 mo

## 2014-06-21 NOTE — Assessment & Plan Note (Signed)

## 2014-06-21 NOTE — Assessment & Plan Note (Signed)
Doing well 

## 2014-06-21 NOTE — Telephone Encounter (Signed)
Pt stated he ask for lab work to be put in prior to his next office visit. Please advise.

## 2014-06-21 NOTE — Assessment & Plan Note (Signed)
Labs Wt loss to cont

## 2014-06-21 NOTE — Progress Notes (Signed)
Pre visit review using our clinic review tool, if applicable. No additional management support is needed unless otherwise documented below in the visit note. 

## 2014-06-21 NOTE — Assessment & Plan Note (Signed)
Nl BP at home Continue with current prescription therapy as reflected on the Med list.  

## 2014-06-21 NOTE — Assessment & Plan Note (Signed)
No relapse 

## 2014-06-23 NOTE — Addendum Note (Signed)
Addended by: Cassandria Anger on: 06/23/2014 08:49 PM   Modules accepted: Orders

## 2014-06-24 NOTE — Telephone Encounter (Signed)
Done. Thx.

## 2014-07-07 ENCOUNTER — Encounter: Payer: Self-pay | Admitting: *Deleted

## 2014-07-07 ENCOUNTER — Encounter: Payer: Self-pay | Admitting: Cardiology

## 2014-07-07 ENCOUNTER — Ambulatory Visit (INDEPENDENT_AMBULATORY_CARE_PROVIDER_SITE_OTHER): Payer: Medicare Other | Admitting: Cardiology

## 2014-07-07 VITALS — BP 130/80 | HR 80 | Ht 72.0 in | Wt 184.7 lb

## 2014-07-07 DIAGNOSIS — Q210 Ventricular septal defect: Secondary | ICD-10-CM

## 2014-07-07 NOTE — Assessment & Plan Note (Signed)
Management per primary care. 

## 2014-07-07 NOTE — Patient Instructions (Signed)

## 2014-07-07 NOTE — Assessment & Plan Note (Signed)
Plan repeat echocardiogram. If right heart enlargement is further we may need to evaluate the size of his VSD and need for closure. It has been small in the past.

## 2014-07-07 NOTE — Assessment & Plan Note (Signed)
Blood pressure controlled. Continue present medications. 

## 2014-07-07 NOTE — Progress Notes (Signed)
HPI: FU congenital VSD, and prior DVT. Note a previous nuclear study performed in January of 2003 showed an ejection fraction of 50% and no ischemia or infarction. Last echocardiogram in may of 2013 showed normal LV function, grade 1 diastolic dysfunction, small perimembranous VSD with peak velocity of 6.1 m/s, mild aortic insufficiency, mild biatrial enlargement and mild right ventricular enlargement. Since I last saw him in May 2013 the patient denies any dyspnea on exertion, orthopnea, PND, pedal edema, palpitations, syncope or chest pain.    Current Outpatient Prescriptions  Medication Sig Dispense Refill  . amoxicillin (AMOXIL) 500 MG capsule TAKE 4 CAPSULES BY MOUTH 1 HOUR BEFORE DENTAL APPOINTMENT  4 capsule  0  . cetirizine (ZYRTEC) 10 MG tablet Take 10 mg by mouth as needed for allergies.       . cholecalciferol (VITAMIN D) 1000 UNITS tablet Take 1,000 Units by mouth every morning.      . DiphenhydrAMINE HCl, Sleep, 25 MG TBDP Take 50 mg by mouth at bedtime.      Marland Kitchen glucosamine-chondroitin 500-400 MG tablet Take 2 tablets by mouth every morning.       . mirabegron ER (MYRBETRIQ) 50 MG TB24 tablet Take 50 mg by mouth 2 (two) times daily.       . montelukast (SINGULAIR) 10 MG tablet TAKE 1 TABLET BY MOUTH DAILY.  90 tablet  3  . Multiple Vitamin (MULTIVITAMIN WITH MINERALS) TABS tablet Take 1 tablet by mouth every morning.      . naproxen sodium (ANAPROX) 220 MG tablet Take 220 mg by mouth 2 (two) times daily with a meal.      . olmesartan (BENICAR) 40 MG tablet TAKE 1/2 TABLET DAILY      . omeprazole-sodium bicarbonate (ZEGERID) 40-1100 MG per capsule Take 1 capsule by mouth daily before breakfast.      . OVER THE COUNTER MEDICATION Take 1 capsule by mouth every morning. OTC herbal supplement for prostate health       No current facility-administered medications for this visit.     Past Medical History  Diagnosis Date  . Hyperlipidemia   . BPH (benign prostatic hyperplasia)    . Anxiety state, unspecified   . Hypertonicity of bladder   . Unspecified tinnitus   . Personal history of urinary calculi   . Esophageal reflux   . Acute hepatitis C without mention of hepatic coma   . Osteoporosis, unspecified   . Barrett's esophagus   . Hiatal hernia   . Insomnia   . DVT (deep venous thrombosis)   . Allergic rhinitis, cause unspecified   . Recent retinal detachment, partial, with giant tear   . Ventricular septal defect   . Elevated PSA     Dr Karsten Ro  . Hypertension     Past Surgical History  Procedure Laterality Date  . Retinal detachment surgery    . Knee surgery      History   Social History  . Marital Status: Married    Spouse Name: Laddie Math    Number of Children: N/A  . Years of Education: N/A   Occupational History  . 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 Smoking status: Former Research scientist (life sciences)  . Smokeless tobacco: Not on file  . Alcohol Use: No  . Drug Use: No  . Sexual Activity: Yes   Other Topics Concern  . Not on file  Social History Narrative                ROS: no fevers or chills, productive cough, hemoptysis, dysphasia, odynophagia, melena, hematochezia, dysuria, hematuria, rash, seizure activity, orthopnea, PND, pedal edema, claudication. Remaining systems are negative.  Physical Exam: Well-developed well-nourished in no acute distress.  Skin is warm and dry.  HEENT is normal.  Neck is supple.  Chest is clear to auscultation with normal expansion.  Cardiovascular exam is regular rate and rhythm. 2/6 systolic murmur left sternal border. Abdominal exam nontender or distended. No masses palpated. Extremities show no edema. neuro grossly intact  ECG Sinus rhythm with no ST changes.

## 2014-07-12 ENCOUNTER — Ambulatory Visit (HOSPITAL_COMMUNITY)
Admission: RE | Admit: 2014-07-12 | Discharge: 2014-07-12 | Disposition: A | Discharge status: Home / Self Care | Payer: Medicare Other | Source: Ambulatory Visit | Attending: Cardiology | Admitting: Cardiology

## 2014-07-12 DIAGNOSIS — I079 Rheumatic tricuspid valve disease, unspecified: Secondary | ICD-10-CM | POA: Insufficient documentation

## 2014-07-12 DIAGNOSIS — I369 Nonrheumatic tricuspid valve disorder, unspecified: Secondary | ICD-10-CM

## 2014-07-12 DIAGNOSIS — Q210 Ventricular septal defect: Secondary | ICD-10-CM | POA: Diagnosis not present

## 2014-07-12 NOTE — Progress Notes (Signed)
2D Echo Performed 07/12/2014    Marygrace Drought, RCS

## 2014-09-02 ENCOUNTER — Ambulatory Visit: Payer: Medicare Other

## 2014-09-02 ENCOUNTER — Other Ambulatory Visit: Payer: Self-pay

## 2014-09-28 DIAGNOSIS — Z85828 Personal history of other malignant neoplasm of skin: Secondary | ICD-10-CM | POA: Diagnosis not present

## 2014-09-28 DIAGNOSIS — L821 Other seborrheic keratosis: Secondary | ICD-10-CM | POA: Diagnosis not present

## 2014-09-28 DIAGNOSIS — L570 Actinic keratosis: Secondary | ICD-10-CM | POA: Diagnosis not present

## 2014-10-21 ENCOUNTER — Ambulatory Visit (INDEPENDENT_AMBULATORY_CARE_PROVIDER_SITE_OTHER): Payer: Medicare Other | Admitting: Internal Medicine

## 2014-10-21 ENCOUNTER — Encounter: Payer: Self-pay | Admitting: Internal Medicine

## 2014-10-21 VITALS — BP 160/100 | HR 91 | Temp 98.4°F | Wt 184.0 lb

## 2014-10-21 DIAGNOSIS — B070 Plantar wart: Secondary | ICD-10-CM | POA: Diagnosis not present

## 2014-10-21 DIAGNOSIS — I10 Essential (primary) hypertension: Secondary | ICD-10-CM | POA: Diagnosis not present

## 2014-10-21 NOTE — Assessment & Plan Note (Signed)
Re-start Benicar Risks associated with treatment noncompliance were discussed. Compliance was encouraged.
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
## 2014-10-21 NOTE — Progress Notes (Signed)
Pre visit review using our clinic review tool, if applicable. No additional management support is needed unless otherwise documented below in the visit note. 

## 2014-10-21 NOTE — Patient Instructions (Signed)
   Postprocedure instructions :     Keep the wounds clean. You can wash them with liquid soap and water. Pat dry with gauze or a Kleenex tissue  Before applying antibiotic ointment and a Band-Aid.   You need to report immediately  if  any signs of infection develop.    

## 2014-10-21 NOTE — Assessment & Plan Note (Signed)
Cryo  

## 2014-11-30 ENCOUNTER — Ambulatory Visit (INDEPENDENT_AMBULATORY_CARE_PROVIDER_SITE_OTHER): Payer: Medicare Other

## 2014-11-30 ENCOUNTER — Ambulatory Visit: Payer: Medicare Other | Admitting: Internal Medicine

## 2014-11-30 DIAGNOSIS — Z23 Encounter for immunization: Secondary | ICD-10-CM

## 2014-12-05 ENCOUNTER — Ambulatory Visit (INDEPENDENT_AMBULATORY_CARE_PROVIDER_SITE_OTHER): Payer: Medicare Other | Admitting: Nurse Practitioner

## 2014-12-05 ENCOUNTER — Encounter: Payer: Self-pay | Admitting: Nurse Practitioner

## 2014-12-05 VITALS — BP 124/70 | HR 94 | Temp 98.3°F | Ht 73.0 in | Wt 188.0 lb

## 2014-12-05 DIAGNOSIS — J069 Acute upper respiratory infection, unspecified: Secondary | ICD-10-CM | POA: Diagnosis not present

## 2014-12-05 NOTE — Progress Notes (Signed)
Pre visit review using our clinic review tool, if applicable. No additional management support is needed unless otherwise documented below in the visit note. 

## 2014-12-05 NOTE — Progress Notes (Signed)
   Subjective:    Patient ID: Roger Kent, male    DOB: 11-Aug-1946, 69 y.o.   MRN: [id number]  URI  This is a new problem. The current episode started in the past 7 days (4 days). The problem has been unchanged. There has been no fever. Associated symptoms include congestion, coughing, a plugged ear sensation and sneezing. Pertinent negatives include no chest pain, diarrhea, ear pain, headaches, sinus pain, sore throat, swollen glands or wheezing. He has tried antihistamine (mucolytic, OTC cough syrup, honey) for the symptoms. The treatment provided mild relief.      Review of Systems  Constitutional: Positive for chills and fatigue. Negative for fever and activity change.  HENT: Positive for congestion and sneezing. Negative for ear pain and sore throat.   Respiratory: Positive for cough. Negative for chest tightness, shortness of breath and wheezing.   Cardiovascular: Negative for chest pain.  Gastrointestinal: Negative for diarrhea.  Neurological: Negative for headaches.       Objective:   Physical Exam  Constitutional: He is oriented to person, place, and time. He appears well-developed and well-nourished. No distress.  HENT:  Head: Normocephalic and atraumatic.  Right Ear: External ear normal.  Left Ear: External ear normal.  Mouth/Throat: No oropharyngeal exudate.  Cloudy fluid bilat TM, bones visible  Eyes: Conjunctivae are normal. Right eye exhibits no discharge. Left eye exhibits no discharge.  Neck: Normal range of motion. Neck supple. No thyromegaly present.  Cardiovascular: Normal rate, regular rhythm and normal heart sounds.   Pulmonary/Chest: Effort normal and breath sounds normal. No respiratory distress. He has no wheezes. He has no rales.  Lymphadenopathy:    He has no cervical adenopathy.  Neurological: He is alert and oriented to person, place, and time.  Skin: Skin is warm and dry.  Psychiatric: He has a normal mood and affect. His behavior is normal.  Thought content normal.  Vitals reviewed.         Assessment & Plan:  1. Upper respiratory infection with cough and congestion Symptom management Daily sinus rinse, mucinex See pt instructions F/u PRN

## 2014-12-05 NOTE — Patient Instructions (Signed)
You have a cold virus causing your symptoms. The average duration of cold symptoms is 14 days. Start daily sinus rinses (neilmed Sinus Rinse). Tylenol or ibuprophen for headache. Vicks vapor rub under nose to help breathe. Continue to use honey thinned with hot tea or lemon juice for cough. You may continue to use mucinex to loosen chest congestion. Sip fluids every hour. Rest. If you are not feeling better in 10 days or develop fever or chest pain, call us for re-evaluation. Feel better!  Upper Respiratory Infection, Adult An upper respiratory infection (URI) is also sometimes known as the common cold. The upper respiratory tract includes the nose, sinuses, throat, trachea, and bronchi. Bronchi are the airways leading to the lungs. Most people improve within 1 week, but symptoms can last up to 2 weeks. A residual cough may last even longer.  CAUSES Many different viruses can infect the tissues lining the upper respiratory tract. The tissues become irritated and inflamed and often become very moist. Mucus production is also common. A cold is contagious. You can easily spread the virus to others by oral contact. This includes kissing, sharing a glass, coughing, or sneezing. Touching your mouth or nose and then touching a surface, which is then touched by another person, can also spread the virus. SYMPTOMS  Symptoms typically develop 1 to 3 days after you come in contact with a cold virus. Symptoms vary from person to person. They may include:  Runny nose.  Sneezing.  Nasal congestion.  Sinus irritation.  Sore throat.  Loss of voice (laryngitis).  Cough.  Fatigue.  Muscle aches.  Loss of appetite.  Headache.  Low-grade fever. DIAGNOSIS  You might diagnose your own cold based on familiar symptoms, since most people get a cold 2 to 3 times a year. Your caregiver can confirm this based on your exam. Most importantly, your caregiver can check that your symptoms are not due to another  disease such as strep throat, sinusitis, pneumonia, asthma, or epiglottitis. Blood tests, throat tests, and X-rays are not necessary to diagnose a common cold, but they may sometimes be helpful in excluding other more serious diseases. Your caregiver will decide if any further tests are required. RISKS AND COMPLICATIONS  You may be at risk for a more severe case of the common cold if you smoke cigarettes, have chronic heart disease (such as heart failure) or lung disease (such as asthma), or if you have a weakened immune system. The very young and very old are also at risk for more serious infections. Bacterial sinusitis, middle ear infections, and bacterial pneumonia can complicate the common cold. The common cold can worsen asthma and chronic obstructive pulmonary disease (COPD). Sometimes, these complications can require emergency medical care and may be life-threatening. PREVENTION  The best way to protect against getting a cold is to practice good hygiene. Avoid oral or hand contact with people with cold symptoms. Wash your hands often if contact occurs. There is no clear evidence that vitamin C, vitamin E, echinacea, or exercise reduces the chance of developing a cold. However, it is always recommended to get plenty of rest and practice good nutrition. TREATMENT  Treatment is directed at relieving symptoms. There is no cure. Antibiotics are not effective, because the infection is caused by a virus, not by bacteria. Treatment may include:  Increased fluid intake. Sports drinks offer valuable electrolytes, sugars, and fluids.  Breathing heated mist or steam (vaporizer or shower).  Eating chicken soup or other clear broths, and maintaining  good nutrition.  Getting plenty of rest.  Using gargles or lozenges for comfort.  Controlling fevers with ibuprofen or acetaminophen as directed by your caregiver.  Increasing usage of your inhaler if you have asthma. Zinc gel and zinc lozenges, taken in  the first 24 hours of the common cold, can shorten the duration and lessen the severity of symptoms. Pain medicines may help with fever, muscle aches, and throat pain. A variety of non-prescription medicines are available to treat congestion and runny nose. Your caregiver can make recommendations and may suggest nasal or lung inhalers for other symptoms.  HOME CARE INSTRUCTIONS   Only take over-the-counter or prescription medicines for pain, discomfort, or fever as directed by your caregiver.  Use a warm mist humidifier or inhale steam from a shower to increase air moisture. This may keep secretions moist and make it easier to breathe.  Drink enough water and fluids to keep your urine clear or pale yellow.  Rest as needed.  Return to work when your temperature has returned to normal or as your caregiver advises. You may need to stay home longer to avoid infecting others. You can also use a face mask and careful hand washing to prevent spread of the virus. SEEK MEDICAL CARE IF:   After the first few days, you feel you are getting worse rather than better.  You need your caregiver's advice about medicines to control symptoms.  You develop chills, worsening shortness of breath, or brown or red sputum. These may be signs of pneumonia.  You develop yellow or brown nasal discharge or pain in the face, especially when you bend forward. These may be signs of sinusitis.  You develop a fever, swollen neck glands, pain with swallowing, or white areas in the back of your throat. These may be signs of strep throat. SEEK IMMEDIATE MEDICAL CARE IF:   You have a fever.  You develop severe or persistent headache, ear pain, sinus pain, or chest pain.  You develop wheezing, a prolonged cough, cough up blood, or have a change in your usual mucus (if you have chronic lung disease).  You develop sore muscles or a stiff neck. Document Released: 04/30/2001 Document Revised: 01/27/2012 Document Reviewed:  03/08/2011 [facility] Patient Information 2014 [place], [place].

## 2015-01-10 ENCOUNTER — Ambulatory Visit (INDEPENDENT_AMBULATORY_CARE_PROVIDER_SITE_OTHER): Payer: Medicare Other | Admitting: Ophthalmology

## 2015-01-10 DIAGNOSIS — H338 Other retinal detachments: Secondary | ICD-10-CM | POA: Diagnosis not present

## 2015-01-10 DIAGNOSIS — I10 Essential (primary) hypertension: Secondary | ICD-10-CM | POA: Diagnosis not present

## 2015-01-10 DIAGNOSIS — H43813 Vitreous degeneration, bilateral: Secondary | ICD-10-CM | POA: Diagnosis not present

## 2015-01-10 DIAGNOSIS — H35033 Hypertensive retinopathy, bilateral: Secondary | ICD-10-CM | POA: Diagnosis not present

## 2015-01-10 DIAGNOSIS — H35341 Macular cyst, hole, or pseudohole, right eye: Secondary | ICD-10-CM | POA: Diagnosis not present

## 2015-05-01 ENCOUNTER — Telehealth: Payer: Self-pay | Admitting: Internal Medicine

## 2015-05-01 MED ORDER — OLMESARTAN MEDOXOMIL 40 MG PO TABS: 20.0000 mg | ORAL_TABLET | Freq: Every day | ORAL | Status: DC

## 2015-05-01 NOTE — Telephone Encounter (Signed)
Rf sent. Left detailed mess informing pt.

## 2015-05-01 NOTE — Telephone Encounter (Signed)
Pt called and needs refill on his olmesartan (BENICAR) 40 MG tablet [[id number]]  Send to [facility] order

## 2015-05-15 ENCOUNTER — Other Ambulatory Visit: Payer: Self-pay

## 2015-05-17 DIAGNOSIS — R972 Elevated prostate specific antigen [PSA]: Secondary | ICD-10-CM | POA: Diagnosis not present

## 2015-05-24 DIAGNOSIS — R972 Elevated prostate specific antigen [PSA]: Secondary | ICD-10-CM | POA: Diagnosis not present

## 2015-05-24 DIAGNOSIS — N401 Enlarged prostate with lower urinary tract symptoms: Secondary | ICD-10-CM | POA: Diagnosis not present

## 2015-05-24 DIAGNOSIS — N138 Other obstructive and reflux uropathy: Secondary | ICD-10-CM | POA: Diagnosis not present

## 2015-08-17 ENCOUNTER — Ambulatory Visit: Payer: Federal, State, Local not specified - PPO

## 2015-08-21 ENCOUNTER — Encounter: Payer: Self-pay | Admitting: Internal Medicine

## 2015-08-21 ENCOUNTER — Ambulatory Visit (INDEPENDENT_AMBULATORY_CARE_PROVIDER_SITE_OTHER): Payer: Medicare Other | Admitting: Internal Medicine

## 2015-08-21 VITALS — BP 140/90 | HR 82 | Wt 186.0 lb

## 2015-08-21 DIAGNOSIS — B070 Plantar wart: Secondary | ICD-10-CM

## 2015-08-21 DIAGNOSIS — M722 Plantar fascial fibromatosis: Secondary | ICD-10-CM | POA: Diagnosis not present

## 2015-08-21 DIAGNOSIS — R972 Elevated prostate specific antigen [PSA]: Secondary | ICD-10-CM

## 2015-08-21 DIAGNOSIS — I10 Essential (primary) hypertension: Secondary | ICD-10-CM

## 2015-08-21 DIAGNOSIS — R739 Hyperglycemia, unspecified: Secondary | ICD-10-CM

## 2015-08-21 DIAGNOSIS — Z23 Encounter for immunization: Secondary | ICD-10-CM | POA: Diagnosis not present

## 2015-08-21 MED ORDER — OLMESARTAN MEDOXOMIL 40 MG PO TABS
40.0000 mg | ORAL_TABLET | Freq: Every day | ORAL | Status: DC
Start: 2015-08-21 — End: 2015-08-21

## 2015-08-21 MED ORDER — OLMESARTAN MEDOXOMIL 40 MG PO TABS: 40.0000 mg | ORAL_TABLET | Freq: Every day | ORAL | Status: DC

## 2015-08-21 NOTE — Progress Notes (Signed)
Subjective:  Patient ID: Roger Kent, male    DOB: 24-Oct-1946  Age: 70 y.o. MRN: [id number]  CC: No chief complaint on file.   HPI Roger Kent presents for L heel pain, warts, HTN f/u  Outpatient Prescriptions Prior to Visit  Medication Sig Dispense Refill  . cetirizine (ZYRTEC) 10 MG tablet Take 10 mg by mouth as needed for allergies.     . cholecalciferol (VITAMIN D) 1000 UNITS tablet Take 1,000 Units by mouth every morning.    . DiphenhydrAMINE HCl, Sleep, 25 MG TBDP Take 50 mg by mouth at bedtime.    Marland Kitchen glucosamine-chondroitin 500-400 MG tablet Take 2 tablets by mouth every morning.     . montelukast (SINGULAIR) 10 MG tablet TAKE 1 TABLET BY MOUTH DAILY. 90 tablet 3  . Multiple Vitamin (MULTIVITAMIN WITH MINERALS) TABS tablet Take 1 tablet by mouth every morning.    . naproxen sodium (ANAPROX) 220 MG tablet Take 220 mg by mouth 2 (two) times daily with a meal.    . omeprazole-sodium bicarbonate (ZEGERID) 40-1100 MG per capsule Take 1 capsule by mouth daily before breakfast.    . OVER THE COUNTER MEDICATION Take 3 capsules by mouth every morning. OTC herbal supplement for prostate health    . olmesartan (BENICAR) 40 MG tablet Take 0.5 tablets (20 mg total) by mouth daily. 45 tablet 3  . amoxicillin (AMOXIL) 500 MG capsule TAKE 4 CAPSULES BY MOUTH 1 HOUR BEFORE DENTAL APPOINTMENT (Patient not taking: Reported on 08/21/2015) 4 capsule 0  . mirabegron ER (MYRBETRIQ) 50 MG TB24 tablet Take 50 mg by mouth 2 (two) times daily.      No facility-administered medications prior to visit.    ROS Review of Systems  Constitutional: Negative for appetite change, fatigue and unexpected weight change.  HENT: Negative for congestion, nosebleeds, sneezing, sore throat and trouble swallowing.   Eyes: Negative for itching and visual disturbance.  Respiratory: Negative for cough.   Cardiovascular: Negative for chest pain, palpitations and leg swelling.  Gastrointestinal: Negative for  nausea, diarrhea, blood in stool and abdominal distention.  Genitourinary: Negative for frequency and hematuria.  Musculoskeletal: Negative for back pain, joint swelling, gait problem and neck pain.  Skin: Negative for rash.  Neurological: Negative for dizziness, tremors, speech difficulty and weakness.  Psychiatric/Behavioral: Negative for suicidal ideas, sleep disturbance, dysphoric mood and agitation. The patient is not nervous/anxious.     Objective:  BP 140/90 mmHg  Pulse 82  Wt 186 lb (84.369 kg)  SpO2 96%  BP Readings from Last 3 Encounters:  08/21/15 140/90  12/05/14 124/70  10/21/14 160/100    Wt Readings from Last 3 Encounters:  08/21/15 186 lb (84.369 kg)  12/05/14 188 lb (85.276 kg)  10/21/14 184 lb (83.462 kg)    Physical Exam  Constitutional: He is oriented to person, place, and time. He appears well-developed. No distress.  NAD  HENT:  Mouth/Throat: Oropharynx is clear and moist.  Eyes: Conjunctivae are normal. Pupils are equal, round, and reactive to light.  Neck: Normal range of motion. No JVD present. No thyromegaly present.  Cardiovascular: Normal rate, regular rhythm, normal heart sounds and intact distal pulses.  Exam reveals no gallop and no friction rub.   No murmur heard. Pulmonary/Chest: Effort normal and breath sounds normal. No respiratory distress. He has no wheezes. He has no rales. He exhibits no tenderness.  Abdominal: Soft. Bowel sounds are normal. He exhibits no distension and no mass. There is no tenderness. There is  no rebound and no guarding.  Musculoskeletal: Normal range of motion. He exhibits no edema or tenderness.  Lymphadenopathy:    He has no cervical adenopathy.  Neurological: He is alert and oriented to person, place, and time. He has normal reflexes. No cranial nerve deficit. He exhibits normal muscle tone. He displays a negative Romberg sign. Coordination and gait normal.  Skin: Skin is warm and dry. No rash noted.  Psychiatric:  He has a normal mood and affect. His behavior is normal. Judgment and thought content normal.   Heel L wart. R Index finger 2 warts R big toenail onycho L heel hurts  Lab Results  Component Value Date   WBC 7.4 08/22/2015   HGB 14.5 08/22/2015   HCT 44.2 08/22/2015   PLT 220.0 08/22/2015   GLUCOSE 103* 08/22/2015   CHOL 136 08/22/2015   TRIG 76.0 08/22/2015   HDL 43.10 08/22/2015   LDLCALC 77 08/22/2015   ALT 15 08/22/2015   AST 16 08/22/2015   NA 141 08/22/2015   K 4.4 08/22/2015   CL 105 08/22/2015   CREATININE 1.01 08/22/2015   BUN 14 08/22/2015   CO2 27 08/22/2015   TSH 2.47 08/22/2015   PSA 10.03* 05/31/2014   INR 2.4 RATIO* 09/17/2007   HGBA1C 5.6 08/22/2015    Procedure Note :     Procedure : Cryosurgery   Indication:  Wart(s)     Risks including unsuccessful procedure , bleeding, infection, bruising, scar, a need for a repeat  procedure and others were explained to the patient in detail as well as the benefits. Informed consent was obtained verbally.    2 lesion(s)  on R index finger and 1 on L heel was/were treated with liquid nitrogen on a Q-tip in a usual fasion . Band-Aid was applied and antibiotic ointment was given for a later use.   Tolerated well. Complications none.    No results found.  Assessment & Plan:   Diagnoses and all orders for this visit:  Need for influenza vaccination -     Flu Vaccine QUAD 36+ mos IM -     CBC with Differential/Platelet; Future -     Basic metabolic panel; Future -     Hemoglobin A1c; Future -     Hepatic function panel; Future -     Lipid panel; Future -     TSH; Future -     Urinalysis; Future  Essential hypertension -     CBC with Differential/Platelet; Future -     Basic metabolic panel; Future -     Hemoglobin A1c; Future -     Hepatic function panel; Future -     Lipid panel; Future -     TSH; Future -     Urinalysis; Future  Hyperglycemia -     CBC with Differential/Platelet; Future -      Basic metabolic panel; Future -     Hemoglobin A1c; Future -     Hepatic function panel; Future -     Lipid panel; Future -     TSH; Future -     Urinalysis; Future  PSA, INCREASED  Other orders -     Discontinue: olmesartan (BENICAR) 40 MG tablet; Take 1 tablet (40 mg total) by mouth daily. -     olmesartan (BENICAR) 40 MG tablet; Take 1 tablet (40 mg total) by mouth daily.   I am having Mr. Ingwersen maintain his cetirizine, cholecalciferol, OVER THE COUNTER MEDICATION, glucosamine-chondroitin, multivitamin with minerals,  mirabegron ER, omeprazole-sodium bicarbonate, naproxen sodium, DiphenhydrAMINE HCl (Sleep), montelukast, amoxicillin, and olmesartan.  Meds ordered this encounter  Medications  . DISCONTD: olmesartan (BENICAR) 40 MG tablet    Sig: Take 1 tablet (40 mg total) by mouth daily.    Dispense:  90 tablet    Refill:  3  . olmesartan (BENICAR) 40 MG tablet    Sig: Take 1 tablet (40 mg total) by mouth daily.    Dispense:  90 tablet    Refill:  3     Follow-up: Return in about 4 months (around 12/22/2015) for Wellness Exam.  Walker Kehr, MD

## 2015-08-21 NOTE — Assessment & Plan Note (Signed)
PSA

## 2015-08-21 NOTE — Assessment & Plan Note (Signed)
On Benicar 

## 2015-08-21 NOTE — Patient Instructions (Signed)
Arch support Ice    Plantar Fasciitis Plantar fasciitis is a common condition that causes foot pain. It is soreness (inflammation) of the band of tough fibrous tissue on the bottom of the foot that runs from the heel bone (calcaneus) to the ball of the foot. The cause of this soreness may be from excessive standing, poor fitting shoes, running on hard surfaces, being overweight, having an abnormal walk, or overuse (this is common in runners) of the painful foot or feet. It is also common in aerobic exercise dancers and ballet dancers. SYMPTOMS  Most people with plantar fasciitis complain of:  Severe pain in the morning on the bottom of their foot especially when taking the first steps out of bed. This pain recedes after a few minutes of walking.  Severe pain is experienced also during walking following a long period of inactivity.  Pain is worse when walking barefoot or up stairs DIAGNOSIS   Your caregiver will diagnose this condition by examining and feeling your foot.  Special tests such as X-rays of your foot, are usually not needed. PREVENTION   Consult a sports medicine professional before beginning a new exercise program.  Walking programs offer a good workout. With walking there is a lower chance of overuse injuries common to runners. There is less impact and less jarring of the joints.  Begin all new exercise programs slowly. If problems or pain develop, decrease the amount of time or distance until you are at a comfortable level.  Wear good shoes and replace them regularly.  Stretch your foot and the heel cords at the back of the ankle (Achilles tendon) both before and after exercise.  Run or exercise on even surfaces that are not hard. For example, asphalt is better than pavement.  Do not run barefoot on hard surfaces.  If using a treadmill, vary the incline.  Do not continue to workout if you have foot or joint problems. Seek professional help if they do not  improve. HOME CARE INSTRUCTIONS   Avoid activities that cause you pain until you recover.  Use ice or cold packs on the problem or painful areas after working out.  Only take over-the-counter or prescription medicines for pain, discomfort, or fever as directed by your caregiver.  Soft shoe inserts or athletic shoes with air or gel sole cushions may be helpful.  If problems continue or become more severe, consult a sports medicine caregiver or your own health care provider. Cortisone is a potent anti-inflammatory medication that may be injected into the painful area. You can discuss this treatment with your caregiver. MAKE SURE YOU:   Understand these instructions.  Will watch your condition.  Will get help right away if you are not doing well or get worse. Document Released: 07/30/2001 Document Revised: 01/27/2012 Document Reviewed: 09/28/2008 [facility] Patient Information 2015 [place], [place]. This information is not intended to replace advice given to you by your health care provider. Make sure you discuss any questions you have with your health care provider.

## 2015-08-21 NOTE — Progress Notes (Signed)
Pre visit review using our clinic review tool, if applicable. No additional management support is needed unless otherwise documented below in the visit note. 

## 2015-08-22 ENCOUNTER — Other Ambulatory Visit (INDEPENDENT_AMBULATORY_CARE_PROVIDER_SITE_OTHER): Payer: Medicare Other

## 2015-08-22 DIAGNOSIS — R739 Hyperglycemia, unspecified: Secondary | ICD-10-CM

## 2015-08-22 DIAGNOSIS — Z23 Encounter for immunization: Secondary | ICD-10-CM | POA: Diagnosis not present

## 2015-08-22 DIAGNOSIS — I10 Essential (primary) hypertension: Secondary | ICD-10-CM

## 2015-08-22 LAB — HEMOGLOBIN A1C: Hgb A1c MFr Bld: 5.6 % (ref 4.6–6.5)

## 2015-08-22 LAB — CBC WITH DIFFERENTIAL/PLATELET
BASOS ABS: 0 10*3/uL (ref 0.0–0.1)
Basophils Relative: 0.4 % (ref 0.0–3.0)
EOS PCT: 1.6 % (ref 0.0–5.0)
Eosinophils Absolute: 0.1 10*3/uL (ref 0.0–0.7)
HEMATOCRIT: 44.2 % (ref 39.0–52.0)
Hemoglobin: 14.5 g/dL (ref 13.0–17.0)
LYMPHS ABS: 2.1 10*3/uL (ref 0.7–4.0)
LYMPHS PCT: 28.5 % (ref 12.0–46.0)
MCHC: 32.8 g/dL (ref 30.0–36.0)
MCV: 93.6 fl (ref 78.0–100.0)
MONOS PCT: 8.7 % (ref 3.0–12.0)
Monocytes Absolute: 0.6 10*3/uL (ref 0.1–1.0)
Neutro Abs: 4.5 10*3/uL (ref 1.4–7.7)
Neutrophils Relative %: 60.8 % (ref 43.0–77.0)
Platelets: 220 10*3/uL (ref 150.0–400.0)
RBC: 4.72 Mil/uL (ref 4.22–5.81)
RDW: 14.5 % (ref 11.5–15.5)
WBC: 7.4 10*3/uL (ref 4.0–10.5)

## 2015-08-22 LAB — BASIC METABOLIC PANEL
BUN: 14 mg/dL (ref 6–23)
CALCIUM: 9.5 mg/dL (ref 8.4–10.5)
CHLORIDE: 105 meq/L (ref 96–112)
CO2: 27 meq/L (ref 19–32)
CREATININE: 1.01 mg/dL (ref 0.40–1.50)
GFR: 77.73 mL/min (ref >60.00)
Glucose, Bld: 103 mg/dL — ABNORMAL HIGH (ref 70–99)
Potassium: 4.4 mEq/L (ref 3.5–5.1)
Sodium: 141 mEq/L (ref 135–145)

## 2015-08-22 LAB — URINALYSIS
Bilirubin Urine: NEGATIVE
HGB URINE DIPSTICK: NEGATIVE
Ketones, ur: NEGATIVE
Leukocytes, UA: NEGATIVE
NITRITE: NEGATIVE
Specific Gravity, Urine: 1.015 (ref 1.000–1.030)
Total Protein, Urine: NEGATIVE
UROBILINOGEN UA: 0.2 (ref 0.0–1.0)
Urine Glucose: NEGATIVE
pH: 7 (ref 5.0–8.0)

## 2015-08-22 LAB — HEPATIC FUNCTION PANEL
ALT: 15 U/L (ref 0–53)
AST: 16 U/L (ref 0–37)
Albumin: 4.2 g/dL (ref 3.5–5.2)
Alkaline Phosphatase: 49 U/L (ref 39–117)
Bilirubin, Direct: 0.1 mg/dL (ref 0.0–0.3)
Total Bilirubin: 0.5 mg/dL (ref 0.2–1.2)
Total Protein: 7.3 g/dL (ref 6.0–8.3)

## 2015-08-22 LAB — TSH: TSH: 2.47 u[IU]/mL (ref 0.35–4.50)

## 2015-08-22 LAB — LIPID PANEL
CHOLESTEROL: 136 mg/dL (ref 0–200)
HDL: 43.1 mg/dL (ref >39.00)
LDL Cholesterol: 77 mg/dL (ref 0–99)
NonHDL: 92.49
TRIGLYCERIDES: 76 mg/dL (ref 0.0–149.0)
Total CHOL/HDL Ratio: 3
VLDL: 15.2 mg/dL (ref 0.0–40.0)

## 2015-08-23 ENCOUNTER — Encounter: Payer: Self-pay | Admitting: Internal Medicine

## 2015-08-23 DIAGNOSIS — M722 Plantar fascial fibromatosis: Secondary | ICD-10-CM | POA: Insufficient documentation

## 2015-08-23 NOTE — Assessment & Plan Note (Signed)
Cryo  

## 2015-08-23 NOTE — Assessment & Plan Note (Signed)
Naproxen Ice

## 2015-10-04 DIAGNOSIS — Z85828 Personal history of other malignant neoplasm of skin: Secondary | ICD-10-CM | POA: Diagnosis not present

## 2015-10-04 DIAGNOSIS — D1801 Hemangioma of skin and subcutaneous tissue: Secondary | ICD-10-CM | POA: Diagnosis not present

## 2015-10-04 DIAGNOSIS — L821 Other seborrheic keratosis: Secondary | ICD-10-CM | POA: Diagnosis not present

## 2015-10-25 ENCOUNTER — Encounter: Payer: Self-pay | Admitting: Internal Medicine

## 2015-10-25 ENCOUNTER — Ambulatory Visit (INDEPENDENT_AMBULATORY_CARE_PROVIDER_SITE_OTHER): Payer: Medicare Other | Admitting: Internal Medicine

## 2015-10-25 VITALS — BP 130/86 | HR 78 | Temp 98.2°F | Resp 16 | Ht 73.0 in | Wt 187.0 lb

## 2015-10-25 DIAGNOSIS — H1132 Conjunctival hemorrhage, left eye: Secondary | ICD-10-CM | POA: Diagnosis not present

## 2015-10-25 DIAGNOSIS — B351 Tinea unguium: Secondary | ICD-10-CM | POA: Diagnosis not present

## 2015-10-25 DIAGNOSIS — H1130 Conjunctival hemorrhage, unspecified eye: Secondary | ICD-10-CM | POA: Insufficient documentation

## 2015-10-25 MED ORDER — TERBINAFINE HCL 250 MG PO TABS: 250.0000 mg | ORAL_TABLET | Freq: Every day | ORAL | Status: DC

## 2015-10-25 NOTE — Patient Instructions (Signed)
Nail Ringworm A fungal infection of the nail (tinea unguium/onychomycosis) is common. It is common as the visible part of the nail is composed of dead cells which have no blood supply to help prevent infection. It occurs because fungi are everywhere and will pick any opportunity to grow on any dead material. Because nails are very slow growing they require up to 2 years of treatment with anti-fungal medications. The entire nail back to the base is infected. This includes approximately  of the nail which you cannot see. If your caregiver has prescribed a medication by mouth, take it every day and as directed. No progress will be seen for at least 6 to 9 months. Do not be disappointed! Because fungi live on dead cells with little or no exposure to blood supply, medication delivery to the infection is slow; thus the cure is slow. It is also why you can observe no progress in the first 6 months. The nail becoming cured is the base of the nail, as it has the blood supply. Topical medication such as creams and ointments are usually not effective. Important in successful treatment of nail fungus is closely following the medication regimen that your doctor prescribes. Sometimes you and your caregiver may elect to speed up this process by surgical removal of all the nails. Even this may still require 6 to 9 months of additional oral medications. See your caregiver as directed. Remember there will be no visible improvement for at least 6 months. See your caregiver sooner if other signs of infection (redness and swelling) develop.   This information is not intended to replace advice given to you by your health care provider. Make sure you discuss any questions you have with your health care provider.   Document Released: 11/01/2000 Document Revised: 03/21/2015 Document Reviewed: 05/08/2015 [employer]vier Interactive Patient Education 2016 [employer]  

## 2015-10-25 NOTE — Progress Notes (Signed)
Subjective:  Patient ID: Roger Kent, male    DOB: 1946-07-08  Age: 69 y.o. MRN: [id number]  CC: Eye Injury  New to me  HPI Roger Kent presents for 2 complaints. #1 he tells me that about 10 days ago he was blowing leaves in his yard and he felt like something got in his left eye. A couple hours later he felt the urge to rub his left eye. He developed blood in the lower part of his eye and a bruise over his left upper malar surface. He has a sense of uneasiness left eye and is concerned that something may have happened to the lens that was replaced several years ago. He also complains of toenail fungus. He has had this problem for several years. He previously tried using a topical treatment with no improvement. He complains that about half of his toenails are discolored, thick and feel uncomfortable.  Outpatient Prescriptions Prior to Visit  Medication Sig Dispense Refill  . cetirizine (ZYRTEC) 10 MG tablet Take 10 mg by mouth as needed for allergies.     . cholecalciferol (VITAMIN D) 1000 UNITS tablet Take 1,000 Units by mouth every morning.    . DiphenhydrAMINE HCl, Sleep, 25 MG TBDP Take 50 mg by mouth at bedtime.    Marland Kitchen glucosamine-chondroitin 500-400 MG tablet Take 2 tablets by mouth every morning.     . montelukast (SINGULAIR) 10 MG tablet TAKE 1 TABLET BY MOUTH DAILY. (Patient taking differently: TAKE 1 TABLET BY MOUTH DAILY AS NEEDED) 90 tablet 3  . Multiple Vitamin (MULTIVITAMIN WITH MINERALS) TABS tablet Take 1 tablet by mouth every morning.    . naproxen sodium (ANAPROX) 220 MG tablet Take 220 mg by mouth 2 (two) times daily.     Marland Kitchen olmesartan (BENICAR) 40 MG tablet Take 1 tablet (40 mg total) by mouth daily. (Patient taking differently: Take 40 mg by mouth daily. pateint taking 1/2 tablet) 90 tablet 3  . omeprazole-sodium bicarbonate (ZEGERID) 40-1100 MG per capsule Take 1 capsule by mouth daily before breakfast.    . OVER THE COUNTER MEDICATION Take 3 capsules by mouth  every morning. OTC herbal supplement for prostate health    . amoxicillin (AMOXIL) 500 MG capsule TAKE 4 CAPSULES BY MOUTH 1 HOUR BEFORE DENTAL APPOINTMENT 4 capsule 0  . mirabegron ER (MYRBETRIQ) 50 MG TB24 tablet Take 50 mg by mouth 2 (two) times daily.      No facility-administered medications prior to visit.    ROS Review of Systems  Constitutional: Negative.   HENT: Negative.   Eyes: Positive for redness. Negative for photophobia, pain, discharge, itching and visual disturbance.  Respiratory: Negative.  Negative for cough, choking and shortness of breath.   Cardiovascular: Negative.  Negative for chest pain, palpitations and leg swelling.  Gastrointestinal: Negative.  Negative for nausea, vomiting, abdominal pain, diarrhea, constipation and blood in stool.  Endocrine: Negative.   Genitourinary: Negative.   Musculoskeletal: Negative.   Skin: Negative.   Allergic/Immunologic: Negative.   Neurological: Negative.   Hematological: Negative.  Negative for adenopathy. Does not bruise/bleed easily.    Objective:  BP 130/86 mmHg  Pulse 78  Temp(Src) 98.2 F (36.8 C) (Oral)  Ht 6\' 1"  (1.854 m)  Wt 187 lb (84.823 kg)  BMI 24.68 kg/m2  SpO2 94%  BP Readings from Last 3 Encounters:  10/25/15 130/86  08/21/15 140/90  12/05/14 124/70    Wt Readings from Last 3 Encounters:  10/25/15 187 lb (84.823 kg)  08/21/15  186 lb (84.369 kg)  12/05/14 188 lb (85.276 kg)    Physical Exam  Constitutional: He is oriented to person, place, and time. He appears well-developed and well-nourished. No distress.  HENT:  Head:    Mouth/Throat: Oropharynx is clear and moist. No oropharyngeal exudate.  Eyes: EOM are normal. Pupils are equal, round, and reactive to light. Right eye exhibits no chemosis, no discharge, no exudate and no hordeolum. No foreign body present in the right eye. Left eye exhibits no chemosis, no discharge, no exudate and no hordeolum. No foreign body present in the left eye.  Right conjunctiva is not injected. Right conjunctiva has no hemorrhage. Left conjunctiva is not injected. Left conjunctiva has a hemorrhage. No scleral icterus. Right eye exhibits normal extraocular motion and no nystagmus. Left eye exhibits normal extraocular motion and no nystagmus. Right pupil is round and reactive. Left pupil is round and reactive. Pupils are equal.    I do not see a foreign body on the surface of the cornea or conjunctival surface. There is a significant subconjunctival injection. Anterior chamber is quiet with no blood. His lens appears to be intact.  Neck: Normal range of motion. Neck supple. No JVD present. No tracheal deviation present. No thyromegaly present.  Cardiovascular: Normal rate, regular rhythm, S1 normal, S2 normal and intact distal pulses.  Exam reveals no gallop and no friction rub.   Murmur heard.  Decrescendo systolic murmur is present with a grade of 1/6   No diastolic murmur is present  Pulmonary/Chest: Effort normal and breath sounds normal. No stridor. No respiratory distress. He has no wheezes. He has no rales. He exhibits no tenderness.  Abdominal: Soft. Bowel sounds are normal. He exhibits no distension and no mass. There is no tenderness. There is no rebound and no guarding.  Musculoskeletal: Normal range of motion. He exhibits no edema or tenderness.  Lymphadenopathy:    He has no cervical adenopathy.  Neurological: He is oriented to person, place, and time.  Skin: Skin is warm and dry. No rash noted. He is not diaphoretic. No erythema. No pallor.  Both great toenails and half of his other toenails show nailbed thickening, lysis, and significant subungual debris.  Vitals reviewed.   Lab Results  Component Value Date   WBC 7.4 08/22/2015   HGB 14.5 08/22/2015   HCT 44.2 08/22/2015   PLT 220.0 08/22/2015   GLUCOSE 103* 08/22/2015   CHOL 136 08/22/2015   TRIG 76.0 08/22/2015   HDL 43.10 08/22/2015   LDLCALC 77 08/22/2015   ALT 15  08/22/2015   AST 16 08/22/2015   NA 141 08/22/2015   K 4.4 08/22/2015   CL 105 08/22/2015   CREATININE 1.01 08/22/2015   BUN 14 08/22/2015   CO2 27 08/22/2015   TSH 2.47 08/22/2015   PSA 10.03* 05/31/2014   INR 2.4 RATIO* 09/17/2007   HGBA1C 5.6 08/22/2015    No results found.  Assessment & Plan:   Jasinto was seen today for eye injury.  Diagnoses and all orders for this visit:  Subconjunctival bleed, left- he has a significant subconjunctival hemorrhage that is slow to heal. I do not see any other complications. I've asked him to see his eye doctor Dr. Zigmund Daniel within the next few days for further evaluation. -     Ambulatory referral to Ophthalmology  Onychomycosis of toenail- his recent liver enzymes were normal, he will start taking terbinafine 250 mg to treat the infection and will return in 3-4 weeks for recheck  of the liver enzymes. If he doesn't develop any symptoms or abnormal liver enzymes he should complete about 3-4 month course of terbinafine. -     Discontinue: terbinafine (LAMISIL) 250 MG tablet; Take 1 tablet (250 mg total) by mouth daily. -     terbinafine (LAMISIL) 250 MG tablet; Take 1 tablet (250 mg total) by mouth daily.   I have discontinued Mr. Zanin mirabegron ER and amoxicillin. I am also having him maintain his cetirizine, cholecalciferol, OVER THE COUNTER MEDICATION, glucosamine-chondroitin, multivitamin with minerals, omeprazole-sodium bicarbonate, naproxen sodium, DiphenhydrAMINE HCl (Sleep), montelukast, olmesartan, and terbinafine.  Meds ordered this encounter  Medications  . DISCONTD: terbinafine (LAMISIL) 250 MG tablet    Sig: Take 1 tablet (250 mg total) by mouth daily.    Dispense:  30 tablet    Refill:  0  . terbinafine (LAMISIL) 250 MG tablet    Sig: Take 1 tablet (250 mg total) by mouth daily.    Dispense:  30 tablet    Refill:  0     Follow-up: Return in about 4 weeks (around 11/22/2015).  Scarlette Calico, MD

## 2015-10-25 NOTE — Progress Notes (Signed)
Pre visit review using our clinic review tool, if applicable. No additional management support is needed unless otherwise documented below in the visit note. 

## 2015-11-22 ENCOUNTER — Encounter: Payer: Self-pay | Admitting: Internal Medicine

## 2015-11-22 ENCOUNTER — Ambulatory Visit (INDEPENDENT_AMBULATORY_CARE_PROVIDER_SITE_OTHER): Payer: Medicare Other | Admitting: Internal Medicine

## 2015-11-22 ENCOUNTER — Other Ambulatory Visit (INDEPENDENT_AMBULATORY_CARE_PROVIDER_SITE_OTHER): Payer: Medicare Other

## 2015-11-22 VITALS — BP 142/92 | HR 82 | Temp 97.8°F | Resp 16 | Ht 73.0 in | Wt 190.0 lb

## 2015-11-22 DIAGNOSIS — B351 Tinea unguium: Secondary | ICD-10-CM

## 2015-11-22 DIAGNOSIS — I10 Essential (primary) hypertension: Secondary | ICD-10-CM | POA: Diagnosis not present

## 2015-11-22 DIAGNOSIS — B070 Plantar wart: Secondary | ICD-10-CM

## 2015-11-22 DIAGNOSIS — Z23 Encounter for immunization: Secondary | ICD-10-CM

## 2015-11-22 LAB — COMPREHENSIVE METABOLIC PANEL
ALK PHOS: 55 U/L (ref 39–117)
ALT: 14 U/L (ref 0–53)
AST: 17 U/L (ref 0–37)
Albumin: 4.4 g/dL (ref 3.5–5.2)
BILIRUBIN TOTAL: 0.5 mg/dL (ref 0.2–1.2)
BUN: 17 mg/dL (ref 6–23)
CO2: 28 meq/L (ref 19–32)
CREATININE: 1.05 mg/dL (ref 0.40–1.50)
Calcium: 9.8 mg/dL (ref 8.4–10.5)
Chloride: 105 mEq/L (ref 96–112)
GFR: 74.27 mL/min (ref >60.00)
Glucose, Bld: 84 mg/dL (ref 70–99)
Potassium: 4.5 mEq/L (ref 3.5–5.1)
SODIUM: 140 meq/L (ref 135–145)
TOTAL PROTEIN: 7.6 g/dL (ref 6.0–8.3)

## 2015-11-22 NOTE — Progress Notes (Signed)
Pre visit review using our clinic review tool, if applicable. No additional management support is needed unless otherwise documented below in the visit note. 

## 2015-11-22 NOTE — Patient Instructions (Signed)
Hypertension Hypertension, commonly called high blood pressure, is when the force of blood pumping through your arteries is too strong. Your arteries are the blood vessels that carry blood from your heart throughout your body. A blood pressure reading consists of a higher number over a lower number, such as 110/72. The higher number (systolic) is the pressure inside your arteries when your heart pumps. The lower number (diastolic) is the pressure inside your arteries when your heart relaxes. Ideally you want your blood pressure below 120/80. Hypertension forces your heart to work harder to pump blood. Your arteries may become narrow or stiff. Having untreated or uncontrolled hypertension can cause heart attack, stroke, kidney disease, and other problems. RISK FACTORS Some risk factors for high blood pressure are controllable. Others are not.  Risk factors you cannot control include:   Race. You may be at higher risk if you are African American.  Age. Risk increases with age.  Gender. Men are at higher risk than women before age 45 years. After age 65, women are at higher risk than men. Risk factors you can control include:  Not getting enough exercise or physical activity.  Being overweight.  Getting too much fat, sugar, calories, or salt in your diet.  Drinking too much alcohol. SIGNS AND SYMPTOMS Hypertension does not usually cause signs or symptoms. Extremely high blood pressure (hypertensive crisis) may cause headache, anxiety, shortness of breath, and nosebleed. DIAGNOSIS To check if you have hypertension, your health care provider will measure your blood pressure while you are seated, with your arm held at the level of your heart. It should be measured at least twice using the same arm. Certain conditions can cause a difference in blood pressure between your right and left arms. A blood pressure reading that is higher than normal on one occasion does not mean that you need treatment. If  it is not clear whether you have high blood pressure, you may be asked to return on a different day to have your blood pressure checked again. Or, you may be asked to monitor your blood pressure at home for 1 or more weeks. TREATMENT Treating high blood pressure includes making lifestyle changes and possibly taking medicine. Living a healthy lifestyle can help lower high blood pressure. You may need to change some of your habits. Lifestyle changes may include:  Following the DASH diet. This diet is high in fruits, vegetables, and whole grains. It is low in salt, red meat, and added sugars.  Keep your sodium intake below 2,300 mg per day.  Getting at least 30-45 minutes of aerobic exercise at least 4 times per week.  Losing weight if necessary.  Not smoking.  Limiting alcoholic beverages.  Learning ways to reduce stress. Your health care provider may prescribe medicine if lifestyle changes are not enough to get your blood pressure under control, and if one of the following is true:  You are 18-59 years of age and your systolic blood pressure is above 140.  You are 60 years of age or older, and your systolic blood pressure is above 150.  Your diastolic blood pressure is above 90.  You have diabetes, and your systolic blood pressure is over 140 or your diastolic blood pressure is over 90.  You have kidney disease and your blood pressure is above 140/90.  You have heart disease and your blood pressure is above 140/90. Your personal target blood pressure may vary depending on your medical conditions, your age, and other factors. HOME CARE INSTRUCTIONS    Have your blood pressure rechecked as directed by your health care provider.   Take medicines only as directed by your health care provider. Follow the directions carefully. Blood pressure medicines must be taken as prescribed. The medicine does not work as well when you skip doses. Skipping doses also puts you at risk for  problems.  Do not smoke.   Monitor your blood pressure at home as directed by your health care provider. SEEK MEDICAL CARE IF:   You think you are having a reaction to medicines taken.  You have recurrent headaches or feel dizzy.  You have swelling in your ankles.  You have trouble with your vision. SEEK IMMEDIATE MEDICAL CARE IF:  You develop a severe headache or confusion.  You have unusual weakness, numbness, or feel faint.  You have severe chest or abdominal pain.  You vomit repeatedly.  You have trouble breathing. MAKE SURE YOU:   Understand these instructions.  Will watch your condition.  Will get help right away if you are not doing well or get worse.   This information is not intended to replace advice given to you by your health care provider. Make sure you discuss any questions you have with your health care provider.   Document Released: 11/04/2005 Document Revised: 03/21/2015 Document Reviewed: 08/27/2013 Elsevier Interactive Patient Education 2016 [employer]  

## 2015-11-22 NOTE — Progress Notes (Signed)
Subjective:  Patient ID: Roger Kent, male    DOB: [birth year]  Age: 70 y.o. MRN: [id number]  CC: Hypertension   HPI Roger Kent presents for a blood pressure check, follow-up on toenail fungus, and concerns about warts on his right index finger and left foot. He has tolerated terbinafine well with no rash, abdominal pain, nausea, vomiting or loss of appetite. He has noticed that normal toenail is starting to grow out from the base.  Outpatient Prescriptions Prior to Visit  Medication Sig Dispense Refill  . cetirizine (ZYRTEC) 10 MG tablet Take 10 mg by mouth as needed for allergies.     . cholecalciferol (VITAMIN D) 1000 UNITS tablet Take 1,000 Units by mouth every morning.    . DiphenhydrAMINE HCl, Sleep, 25 MG TBDP Take 50 mg by mouth at bedtime.    Marland Kitchen glucosamine-chondroitin 500-400 MG tablet Take 2 tablets by mouth every morning.     . montelukast (SINGULAIR) 10 MG tablet TAKE 1 TABLET BY MOUTH DAILY. (Patient taking differently: TAKE 1 TABLET BY MOUTH DAILY AS NEEDED) 90 tablet 3  . Multiple Vitamin (MULTIVITAMIN WITH MINERALS) TABS tablet Take 1 tablet by mouth every morning.    . naproxen sodium (ANAPROX) 220 MG tablet Take 220 mg by mouth 2 (two) times daily.     Marland Kitchen olmesartan (BENICAR) 40 MG tablet Take 1 tablet (40 mg total) by mouth daily. (Patient taking differently: Take 40 mg by mouth daily. pateint taking 1/2 tablet) 90 tablet 3  . omeprazole-sodium bicarbonate (ZEGERID) 40-1100 MG per capsule Take 1 capsule by mouth daily before breakfast.    . OVER THE COUNTER MEDICATION Take 3 capsules by mouth every morning. OTC herbal supplement for prostate health    . terbinafine (LAMISIL) 250 MG tablet Take 1 tablet (250 mg total) by mouth daily. 30 tablet 0   No facility-administered medications prior to visit.    ROS Review of Systems  Constitutional: Negative.  Negative for fever, chills, diaphoresis, activity change, appetite change, fatigue and unexpected weight  change.  HENT: Negative.   Eyes: Negative.   Respiratory: Negative.  Negative for cough, choking, chest tightness, shortness of breath and stridor.   Cardiovascular: Negative.  Negative for chest pain, palpitations and leg swelling.  Gastrointestinal: Negative.  Negative for nausea, vomiting, abdominal pain, diarrhea and constipation.  Genitourinary: Negative.  Negative for dysuria, hematuria and difficulty urinating.  Musculoskeletal: Negative.   Skin: Negative.  Negative for color change, pallor, rash and wound.       Warts  Neurological: Negative.   Hematological: Negative.   Psychiatric/Behavioral: Negative.     Objective:  BP 142/92 mmHg  Pulse 82  Temp(Src) 97.8 F (36.6 C) (Oral)  Resp 16  Ht 6\' 1"  (1.854 m)  Wt 190 lb (86.183 kg)  BMI 25.07 kg/m2  SpO2 95%  BP Readings from Last 3 Encounters:  11/22/15 142/92  10/25/15 130/86  08/21/15 140/90    Wt Readings from Last 3 Encounters:  11/22/15 190 lb (86.183 kg)  10/25/15 187 lb (84.823 kg)  08/21/15 186 lb (84.369 kg)    Physical Exam  Constitutional: He appears well-developed and well-nourished. No distress.  HENT:  Head: Normocephalic and atraumatic.  Mouth/Throat: Oropharynx is clear and moist. No oropharyngeal exudate.  Eyes: Conjunctivae are normal. Right eye exhibits no discharge. Left eye exhibits no discharge. No scleral icterus.  Neck: Normal range of motion. Neck supple. No JVD present. No tracheal deviation present. No thyromegaly present.  Cardiovascular: Normal rate,  regular rhythm, normal heart sounds and intact distal pulses.  Exam reveals no gallop and no friction rub.   No murmur heard. Pulmonary/Chest: Effort normal and breath sounds normal. No stridor. No respiratory distress. He has no wheezes. He has no rales. He exhibits no tenderness.  Abdominal: Soft. Bowel sounds are normal. He exhibits no distension and no mass. There is no tenderness. There is no rebound and no guarding.    Musculoskeletal: Normal range of motion. He exhibits no edema or tenderness.  Lymphadenopathy:    He has no cervical adenopathy.  Skin: Skin is warm and dry. No rash noted. He is not diaphoretic. No erythema. No pallor.  There are 3 verrucous lesions on the right index finger over the palmar/lateral side of the distant phalanx. There is also a verrucous lesion on the plantar side of the left foot. These lesions were treated with 3 cycles of freeze in thaw with cryotherapy last about 15 seconds each. The toenails that are infected with onychomycosis are starting to show normal nail bed development at the bases.  Psychiatric: He has a normal mood and affect. His behavior is normal. Thought content normal.    Lab Results  Component Value Date   WBC 7.4 08/22/2015   HGB 14.5 08/22/2015   HCT 44.2 08/22/2015   PLT 220.0 08/22/2015   GLUCOSE 84 11/22/2015   CHOL 136 08/22/2015   TRIG 76.0 08/22/2015   HDL 43.10 08/22/2015   LDLCALC 77 08/22/2015   ALT 14 11/22/2015   AST 17 11/22/2015   NA 140 11/22/2015   K 4.5 11/22/2015   CL 105 11/22/2015   CREATININE 1.05 11/22/2015   BUN 17 11/22/2015   CO2 28 11/22/2015   TSH 2.47 08/22/2015   PSA 10.03* 05/31/2014   INR 2.4 RATIO* 09/17/2007   HGBA1C 5.6 08/22/2015    No results found.  Assessment & Plan:   Jimie was seen today for hypertension.  Diagnoses and all orders for this visit:  Need for 23-polyvalent pneumococcal polysaccharide vaccine -     Pneumococcal polysaccharide vaccine 23-valent greater than or equal to 2yo subcutaneous/IM  Essential hypertension- his blood pressure is well-controlled, lites and renal function are stable. -     Comprehensive metabolic panel; Future  Plantar wart of right foot- this was treated with cryotherapy  Onychomycosis of toenail- he has had a very good response to the terbinafine and is tolerating it well, his liver enzymes continue to be normal, will continue for another 3 months. -      Comprehensive metabolic panel; Future   I am having Mr. Olbera maintain his cetirizine, cholecalciferol, OVER THE COUNTER MEDICATION, glucosamine-chondroitin, multivitamin with minerals, omeprazole-sodium bicarbonate, naproxen sodium, DiphenhydrAMINE HCl (Sleep), montelukast, olmesartan, and terbinafine.  No orders of the defined types were placed in this encounter.     Follow-up: Return in about 4 months (around 03/21/2016).  Scarlette Calico, MD

## 2015-11-24 ENCOUNTER — Telehealth: Payer: Self-pay | Admitting: Internal Medicine

## 2015-11-24 NOTE — Telephone Encounter (Signed)
[place] [facility] - Client [facility] Patient Name: Roger Kent DOB: Aug 08, 1946 Initial Comment Caller states, was seen on Wed for fungus on toes, he also had a pneumonia booster shot, but now has hives. Nurse Assessment Nurse: Markus Daft, RN, Sherre Poot Date/Time (Eastern Time): 11/24/2015 8:28:34 AM Confirm and document reason for call. If symptomatic, describe symptoms. ---Caller states was seen on Wed by MD for Toe Fungus. He had been itching at the time, and thought it was d/t dry skin. He also had a pneumonia booster shot, and now has hives. He doesn't see large red welts unless he itches then there is redness. There is only redness in area that he has scratched. He applied a liquid to the rash for poison ivy as this is all he had on hand. Helped just a little. Has the patient traveled out of the country within the last 30 days? ---Not Applicable Does the patient have any new or worsening symptoms? ---Yes Will a triage be completed? ---Yes Related visit to physician within the last 2 weeks? ---Yes Does the PT have any chronic conditions? (i.e. diabetes, asthma, etc.) ---Yes List chronic conditions. ---HTN Is this a behavioral health or substance abuse call? ---No Guidelines Guideline Title Affirmed Question Affirmed Notes Immunization Reactions Pneumococcal vaccine reactions (all triage questions negative) Itching - Widespread [1] MODERATE-SEVERE widespread itching (i.e., interferes with sleep, normal activities or school) AND [2] not improved after 24 hours of itching Care Advice Final Disposition User See Physician within [address] Hours [place], [place], [place] Comments Interfering with sleep, but daily activities he is able to continue. Referrals GO TO FACILITY UNDECIDED Disagree/Comply: Comply He is wondering if r/t the anti fungal - he on oral medicine, he's been on it for 28 days. Office: please contact him. He has two  left for the month. He is willing to come in, but would like to know what MD thinks.

## 2015-12-22 ENCOUNTER — Encounter: Payer: Self-pay | Admitting: Internal Medicine

## 2015-12-22 ENCOUNTER — Ambulatory Visit (INDEPENDENT_AMBULATORY_CARE_PROVIDER_SITE_OTHER): Payer: Medicare Other | Admitting: Internal Medicine

## 2015-12-22 ENCOUNTER — Other Ambulatory Visit (INDEPENDENT_AMBULATORY_CARE_PROVIDER_SITE_OTHER): Payer: Medicare Other

## 2015-12-22 VITALS — BP 148/88 | HR 76 | Ht 73.0 in | Wt 187.0 lb

## 2015-12-22 DIAGNOSIS — Z0000 Encounter for general adult medical examination without abnormal findings: Secondary | ICD-10-CM | POA: Diagnosis not present

## 2015-12-22 DIAGNOSIS — N400 Enlarged prostate without lower urinary tract symptoms: Secondary | ICD-10-CM | POA: Diagnosis not present

## 2015-12-22 DIAGNOSIS — M722 Plantar fascial fibromatosis: Secondary | ICD-10-CM

## 2015-12-22 DIAGNOSIS — B070 Plantar wart: Secondary | ICD-10-CM

## 2015-12-22 DIAGNOSIS — B351 Tinea unguium: Secondary | ICD-10-CM | POA: Diagnosis not present

## 2015-12-22 DIAGNOSIS — I10 Essential (primary) hypertension: Secondary | ICD-10-CM

## 2015-12-22 DIAGNOSIS — R21 Rash and other nonspecific skin eruption: Secondary | ICD-10-CM

## 2015-12-22 DIAGNOSIS — Z8719 Personal history of other diseases of the digestive system: Secondary | ICD-10-CM | POA: Diagnosis not present

## 2015-12-22 DIAGNOSIS — R739 Hyperglycemia, unspecified: Secondary | ICD-10-CM

## 2015-12-22 DIAGNOSIS — E785 Hyperlipidemia, unspecified: Secondary | ICD-10-CM

## 2015-12-22 LAB — CBC WITH DIFFERENTIAL/PLATELET
BASOS ABS: 0 10*3/uL (ref 0.0–0.1)
BASOS PCT: 0.4 % (ref 0.0–3.0)
Eosinophils Absolute: 0.2 10*3/uL (ref 0.0–0.7)
Eosinophils Relative: 1.8 % (ref 0.0–5.0)
HEMATOCRIT: 45.1 % (ref 39.0–52.0)
Hemoglobin: 14.9 g/dL (ref 13.0–17.0)
LYMPHS PCT: 27.2 % (ref 12.0–46.0)
Lymphs Abs: 2.7 10*3/uL (ref 0.7–4.0)
MCHC: 33.1 g/dL (ref 30.0–36.0)
MCV: 93.6 fl (ref 78.0–100.0)
MONOS PCT: 8.4 % (ref 3.0–12.0)
Monocytes Absolute: 0.8 10*3/uL (ref 0.1–1.0)
Neutro Abs: 6.1 10*3/uL (ref 1.4–7.7)
Neutrophils Relative %: 62.2 % (ref 43.0–77.0)
Platelets: 305 10*3/uL (ref 150.0–400.0)
RBC: 4.81 Mil/uL (ref 4.22–5.81)
RDW: 14.3 % (ref 11.5–15.5)
WBC: 9.8 10*3/uL (ref 4.0–10.5)

## 2015-12-22 LAB — URINALYSIS
Bilirubin Urine: NEGATIVE
Hgb urine dipstick: NEGATIVE
Ketones, ur: NEGATIVE
Leukocytes, UA: NEGATIVE
Nitrite: NEGATIVE
PH: 6.5 (ref 5.0–8.0)
Specific Gravity, Urine: 1.01 (ref 1.000–1.030)
TOTAL PROTEIN, URINE-UPE24: NEGATIVE
Urine Glucose: NEGATIVE
Urobilinogen, UA: 0.2 (ref 0.0–1.0)

## 2015-12-22 LAB — LIPID PANEL
CHOL/HDL RATIO: 4
Cholesterol: 148 mg/dL (ref 0–200)
HDL: 37.7 mg/dL — AB (ref >39.00)
LDL CALC: 92 mg/dL (ref 0–99)
NonHDL: 110.73
Triglycerides: 96 mg/dL (ref 0.0–149.0)
VLDL: 19.2 mg/dL (ref 0.0–40.0)

## 2015-12-22 LAB — TSH: TSH: 1.92 u[IU]/mL (ref 0.35–4.50)

## 2015-12-22 LAB — HEMOGLOBIN A1C: Hgb A1c MFr Bld: 5.6 % (ref 4.6–6.5)

## 2015-12-22 LAB — PSA: PSA: 8.08 ng/mL — ABNORMAL HIGH (ref 0.10–4.00)

## 2015-12-22 NOTE — Assessment & Plan Note (Signed)

## 2015-12-22 NOTE — Assessment & Plan Note (Addendum)
NAS diet, Benicar

## 2015-12-22 NOTE — Assessment & Plan Note (Signed)
Dr Karsten Ro checks it Chronic

## 2015-12-22 NOTE — Assessment & Plan Note (Signed)
D/c Lamisil due to rash

## 2015-12-22 NOTE — Assessment & Plan Note (Signed)
See Cryo 

## 2015-12-22 NOTE — Assessment & Plan Note (Signed)
Chronic - undetectable  F/u w/Dr Medoff

## 2015-12-22 NOTE — Progress Notes (Signed)
Subjective:  Patient ID: Roger Kent, male    DOB: 09-Jan-1946  Age: 70 y.o. MRN: [id number]  CC: No chief complaint on file.   HPI Jamarr Persad Gohlke presents for a well exam Pt saw Dr Alona Bene for a pink eye; then pt developed a rash after he started Lamisil in Jan 2017 C/o plantar warts  Outpatient Prescriptions Prior to Visit  Medication Sig Dispense Refill  . cetirizine (ZYRTEC) 10 MG tablet Take 10 mg by mouth as needed for allergies.     . cholecalciferol (VITAMIN D) 1000 UNITS tablet Take 1,000 Units by mouth every morning.    . DiphenhydrAMINE HCl, Sleep, 25 MG TBDP Take 50 mg by mouth at bedtime.    Marland Kitchen glucosamine-chondroitin 500-400 MG tablet Take 2 tablets by mouth every morning.     . montelukast (SINGULAIR) 10 MG tablet TAKE 1 TABLET BY MOUTH DAILY. (Patient taking differently: TAKE 1 TABLET BY MOUTH DAILY AS NEEDED) 90 tablet 3  . Multiple Vitamin (MULTIVITAMIN WITH MINERALS) TABS tablet Take 1 tablet by mouth every morning.    . naproxen sodium (ANAPROX) 220 MG tablet Take 220 mg by mouth 2 (two) times daily.     Marland Kitchen olmesartan (BENICAR) 40 MG tablet Take 1 tablet (40 mg total) by mouth daily. (Patient taking differently: Take 20 mg by mouth daily. pateint taking 1/2 tablet) 90 tablet 3  . omeprazole-sodium bicarbonate (ZEGERID) 40-1100 MG per capsule Take 1 capsule by mouth daily before breakfast.    . OVER THE COUNTER MEDICATION Take 3 capsules by mouth every morning. OTC herbal supplement for prostate health    . terbinafine (LAMISIL) 250 MG tablet Take 1 tablet (250 mg total) by mouth daily. (Patient not taking: Reported on 12/22/2015) 30 tablet 0   No facility-administered medications prior to visit.    ROS Review of Systems  Constitutional: Negative for appetite change, fatigue and unexpected weight change.  HENT: Negative for congestion, nosebleeds, sneezing, sore throat and trouble swallowing.   Eyes: Negative for itching and visual disturbance.    Respiratory: Negative for cough.   Cardiovascular: Negative for chest pain, palpitations and leg swelling.  Gastrointestinal: Negative for nausea, diarrhea, blood in stool and abdominal distention.  Genitourinary: Negative for frequency and hematuria.  Musculoskeletal: Negative for back pain, joint swelling, gait problem and neck pain.  Skin: Positive for rash.  Neurological: Negative for dizziness, tremors, speech difficulty and weakness.  Psychiatric/Behavioral: Negative for sleep disturbance, dysphoric mood and agitation. The patient is not nervous/anxious.     Objective:  BP 148/88 mmHg  Pulse 76  Ht 6\' 1"  (1.854 m)  Wt 187 lb (84.823 kg)  BMI 24.68 kg/m2  SpO2 96%  BP Readings from Last 3 Encounters:  12/22/15 148/88  11/22/15 142/92  10/25/15 130/86    Wt Readings from Last 3 Encounters:  12/22/15 187 lb (84.823 kg)  11/22/15 190 lb (86.183 kg)  10/25/15 187 lb (84.823 kg)    Physical Exam  Constitutional: He is oriented to person, place, and time. He appears well-developed. No distress.  NAD  HENT:  Mouth/Throat: Oropharynx is clear and moist.  Eyes: Conjunctivae are normal. Pupils are equal, round, and reactive to light.  Neck: Normal range of motion. No JVD present. No thyromegaly present.  Cardiovascular: Normal rate, regular rhythm, normal heart sounds and intact distal pulses.  Exam reveals no gallop and no friction rub.   No murmur heard. Pulmonary/Chest: Effort normal and breath sounds normal. No respiratory distress. He has  no wheezes. He has no rales. He exhibits no tenderness.  Abdominal: Soft. Bowel sounds are normal. He exhibits no distension and no mass. There is no tenderness. There is no rebound and no guarding.  Musculoskeletal: Normal range of motion. He exhibits no edema or tenderness.  Lymphadenopathy:    He has no cervical adenopathy.  Neurological: He is alert and oriented to person, place, and time. He has normal reflexes. No cranial nerve  deficit. He exhibits normal muscle tone. He displays a negative Romberg sign. Coordination and gait normal.  Skin: Skin is warm and dry. No rash noted.  Psychiatric: He has a normal mood and affect. His behavior is normal. Judgment and thought content normal.  no rash Rectal - per Dr Karsten Ro and Medoff   L heel warts x2   Procedure Note :     Procedure : Cryosurgery   Indication:  Wart(s)     Risks including unsuccessful procedure , bleeding, infection, bruising, scar, a need for a repeat  procedure and others were explained to the patient in detail as well as the benefits. Informed consent was obtained verbally.   2  lesion(s)  on L heel   was/were treated with liquid nitrogen on a Q-tip in a usual fasion . Band-Aid was applied and antibiotic ointment was given for a later use.   Tolerated well. Complications none.   Postprocedure instructions :     Keep the wounds clean. You can wash them with liquid soap and water. Pat dry with gauze or a Kleenex tissue  Before applying antibiotic ointment and a Band-Aid.   You need to report immediately  if  any signs of infection develop.     Lab Results  Component Value Date   WBC 9.8 12/22/2015   HGB 14.9 12/22/2015   HCT 45.1 12/22/2015   PLT 305.0 12/22/2015   GLUCOSE 84 11/22/2015   CHOL 148 12/22/2015   TRIG 96.0 12/22/2015   HDL 37.70* 12/22/2015   LDLCALC 92 12/22/2015   ALT 14 11/22/2015   AST 17 11/22/2015   NA 140 11/22/2015   K 4.5 11/22/2015   CL 105 11/22/2015   CREATININE 1.05 11/22/2015   BUN 17 11/22/2015   CO2 28 11/22/2015   TSH 1.92 12/22/2015   PSA 8.08* 12/22/2015   INR 2.4 RATIO* 09/17/2007   HGBA1C 5.6 12/22/2015    No results found.  Assessment & Plan:   Diagnoses and all orders for this visit:  Well adult exam -     CBC with Differential/Platelet; Future -     Lipid panel; Future -     TSH; Future -     PSA; Future -     Urinalysis; Future  H/O chronic hepatitis -     CBC with  Differential/Platelet; Future  Onychomycosis of toenail -     CBC with Differential/Platelet; Future  BPH (benign prostatic hyperplasia) -     PSA; Future  Rash and nonspecific skin eruption -     CBC with Differential/Platelet; Future  Plantar fasciitis of left foot  Essential hypertension -     CBC with Differential/Platelet; Future -     Lipid panel; Future  Dyslipidemia -     CBC with Differential/Platelet; Future -     Lipid panel; Future -     TSH; Future  I have discontinued Mr. Sehgal's terbinafine. I am also having him maintain his cetirizine, cholecalciferol, OVER THE COUNTER MEDICATION, glucosamine-chondroitin, multivitamin with minerals, omeprazole-sodium bicarbonate, naproxen sodium, DiphenhydrAMINE  HCl (Sleep), montelukast, olmesartan, CRANBERRY PO, and OVER THE COUNTER MEDICATION.  Meds ordered this encounter  Medications  . CRANBERRY PO    Sig: Take 1 capsule by mouth daily.  Marland Kitchen OVER THE COUNTER MEDICATION    Sig: Ultimate Bladder Support 1 each by mouth daily.     Follow-up: Return in about 6 months (around 06/20/2016) for a follow-up visit.  Walker Kehr, MD

## 2015-12-22 NOTE — Assessment & Plan Note (Signed)
2017 rash due to Lamisil Rx d/c'd

## 2015-12-22 NOTE — Patient Instructions (Signed)
Preventive Care for Adults, Male A healthy lifestyle and preventive care can promote health and wellness. Preventive health guidelines for men include the following key practices:  A routine yearly physical is a good way to check with your health care provider about your health and preventative screening. It is a chance to share any concerns and updates on your health and to receive a thorough exam.  Visit your dentist for a routine exam and preventative care every 6 months. Brush your teeth twice a day and floss once a day. Good oral hygiene prevents tooth decay and gum disease.  The frequency of eye exams is based on your age, health, family medical history, use of contact lenses, and other factors. Follow your health care provider's recommendations for frequency of eye exams.  Eat a healthy diet. Foods such as vegetables, fruits, whole grains, low-fat dairy products, and lean protein foods contain the nutrients you need without too many calories. Decrease your intake of foods high in solid fats, added sugars, and salt. Eat the right amount of calories for you.Get information about a proper diet from your health care provider, if necessary.  Regular physical exercise is one of the most important things you can do for your health. Most adults should get at least 150 minutes of moderate-intensity exercise (any activity that increases your heart rate and causes you to sweat) each week. In addition, most adults need muscle-strengthening exercises on 2 or more days a week.  Maintain a healthy weight. The body mass index (BMI) is a screening tool to identify possible weight problems. It provides an estimate of body fat based on height and weight. Your health care provider can find your BMI and can help you achieve or maintain a healthy weight.For adults 20 years and older:  A BMI below 18.5 is considered underweight.  A BMI of 18.5 to 24.9 is normal.  A BMI of 25 to 29.9 is considered  overweight.  A BMI of 30 and above is considered obese.  Maintain normal blood lipids and cholesterol levels by exercising and minimizing your intake of saturated fat. Eat a balanced diet with plenty of fruit and vegetables. Blood tests for lipids and cholesterol should begin at age 20 and be repeated every 5 years. If your lipid or cholesterol levels are high, you are over 50, or you are at high risk for heart disease, you may need your cholesterol levels checked more frequently.Ongoing high lipid and cholesterol levels should be treated with medicines if diet and exercise are not working.  If you smoke, find out from your health care provider how to quit. If you do not use tobacco, do not start.  Lung cancer screening is recommended for adults aged 55-80 years who are at high risk for developing lung cancer because of a history of smoking. A yearly low-dose CT scan of the lungs is recommended for people who have at least a 30-pack-year history of smoking and are a current smoker or have quit within the past 15 years. A pack year of smoking is smoking an average of 1 pack of cigarettes a day for 1 year (for example: 1 pack a day for 30 years or 2 packs a day for 15 years). Yearly screening should continue until the smoker has stopped smoking for at least 15 years. Yearly screening should be stopped for people who develop a health problem that would prevent them from having lung cancer treatment.  If you choose to drink alcohol, do not have more   than 2 drinks per day. One drink is considered to be 12 ounces (355 mL) of beer, 5 ounces (148 mL) of wine, or 1.5 ounces (44 mL) of liquor.  Avoid use of street drugs. Do not share needles with anyone. Ask for help if you need support or instructions about stopping the use of drugs.  High blood pressure causes heart disease and increases the risk of stroke. Your blood pressure should be checked at least every 1-2 years. Ongoing high blood pressure should be  treated with medicines, if weight loss and exercise are not effective.  If you are 45-79 years old, ask your health care provider if you should take aspirin to prevent heart disease.  Diabetes screening is done by taking a blood sample to check your blood glucose level after you have not eaten for a certain period of time (fasting). If you are not overweight and you do not have risk factors for diabetes, you should be screened once every 3 years starting at age 45. If you are overweight or obese and you are 40-70 years of age, you should be screened for diabetes every year as part of your cardiovascular risk assessment.  Colorectal cancer can be detected and often prevented. Most routine colorectal cancer screening begins at the age of 50 and continues through age 75. However, your health care provider may recommend screening at an earlier age if you have risk factors for colon cancer. On a yearly basis, your health care provider may provide home test kits to check for hidden blood in the stool. Use of a small camera at the end of a tube to directly examine the colon (sigmoidoscopy or colonoscopy) can detect the earliest forms of colorectal cancer. Talk to your health care provider about this at age 50, when routine screening begins. Direct exam of the colon should be repeated every 5-10 years through age 75, unless early forms of precancerous polyps or small growths are found.  People who are at an increased risk for hepatitis B should be screened for this virus. You are considered at high risk for hepatitis B if:  You were born in a country where hepatitis B occurs often. Talk with your health care provider about which countries are considered high risk.  Your parents were born in a high-risk country and you have not received a shot to protect against hepatitis B (hepatitis B vaccine).  You have HIV or AIDS.  You use needles to inject street drugs.  You live with, or have sex with, someone who  has hepatitis B.  You are a man who has sex with other men (MSM).  You get hemodialysis treatment.  You take certain medicines for conditions such as cancer, organ transplantation, and autoimmune conditions.  Hepatitis C blood testing is recommended for all people born from 1945 through 1965 and any individual with known risks for hepatitis C.  Practice safe sex. Use condoms and avoid high-risk sexual practices to reduce the spread of sexually transmitted infections (STIs). STIs include gonorrhea, chlamydia, syphilis, trichomonas, herpes, HPV, and human immunodeficiency virus (HIV). Herpes, HIV, and HPV are viral illnesses that have no cure. They can result in disability, cancer, and death.  If you are a man who has sex with other men, you should be screened at least once per year for:  HIV.  Urethral, rectal, and pharyngeal infection of gonorrhea, chlamydia, or both.  If you are at risk of being infected with HIV, it is recommended that you take a   take a prescription medicine daily to prevent HIV infection. This is called preexposure prophylaxis (PrEP). You are considered at risk if:  You are a man who has sex with other men (MSM) and have other risk factors.  You are a heterosexual man, are sexually active, and are at increased risk for HIV infection.  You take drugs by injection.  You are sexually active with a partner who has HIV.  Talk with your health care provider about whether you are at high risk of being infected with HIV. If you choose to begin PrEP, you should first be tested for HIV. You should then be tested every 3 months for as long as you are taking PrEP.  A one-time screening for abdominal aortic aneurysm (AAA) and surgical repair of large AAAs by ultrasound are recommended for men ages 73 to 59 years who are current or former smokers.  Healthy men should no longer receive prostate-specific antigen (PSA) blood tests as part of routine cancer screening. Talk with your health  care provider about prostate cancer screening.  Testicular cancer screening is not recommended for adult males who have no symptoms. Screening includes self-exam, a health care provider exam, and other screening tests. Consult with your health care provider about any symptoms you have or any concerns you have about testicular cancer.  Use sunscreen. Apply sunscreen liberally and repeatedly throughout the day. You should seek shade when your shadow is shorter than you. Protect yourself by wearing long sleeves, pants, a wide-brimmed hat, and sunglasses year round, whenever you are outdoors.  Once a month, do a whole-body skin exam, using a mirror to look at the skin on your back. Tell your health care provider about new moles, moles that have irregular borders, moles that are larger than a pencil eraser, or moles that have changed in shape or color.  Stay current with required vaccines (immunizations).  Influenza vaccine. All adults should be immunized every year.  Tetanus, diphtheria, and acellular pertussis (Td, Tdap) vaccine. An adult who has not previously received Tdap or who does not know his vaccine status should receive 1 dose of Tdap. This initial dose should be followed by tetanus and diphtheria toxoids (Td) booster doses every 10 years. Adults with an unknown or incomplete history of completing a 3-dose immunization series with Td-containing vaccines should begin or complete a primary immunization series including a Tdap dose. Adults should receive a Td booster every 10 years.  Varicella vaccine. An adult without evidence of immunity to varicella should receive 2 doses or a second dose if he has previously received 1 dose.  Human papillomavirus (HPV) vaccine. Males aged 11-21 years who have not received the vaccine previously should receive the 3-dose series. Males aged 22-26 years may be immunized. Immunization is recommended through the age of 48 years for any male who has sex with males  and did not get any or all doses earlier. Immunization is recommended for any person with an immunocompromised condition through the age of 25 years if he did not get any or all doses earlier. During the 3-dose series, the second dose should be obtained 4-8 weeks after the first dose. The third dose should be obtained 24 weeks after the first dose and 16 weeks after the second dose.  Zoster vaccine. One dose is recommended for adults aged 50 years or older unless certain conditions are present.  Measles, mumps, and rubella (MMR) vaccine. Adults born before 26 generally are considered immune to measles and mumps. Adults born  or later should have 1 or more doses of MMR vaccine unless there is a contraindication to the vaccine or there is laboratory evidence of immunity to each of the three diseases. A routine second dose of MMR vaccine should be obtained at least 28 days after the first dose for students attending postsecondary schools, health care workers, or international travelers. People who received inactivated measles vaccine or an unknown type of measles vaccine during 1963-1967 should receive 2 doses of MMR vaccine. People who received inactivated mumps vaccine or an unknown type of mumps vaccine before 1979 and are at high risk for mumps infection should consider immunization with 2 doses of MMR vaccine. Unvaccinated health care workers born before 1957 who lack laboratory evidence of measles, mumps, or rubella immunity or laboratory confirmation of disease should consider measles and mumps immunization with 2 doses of MMR vaccine or rubella immunization with 1 dose of MMR vaccine.  Pneumococcal 13-valent conjugate (PCV13) vaccine. When indicated, a person who is uncertain of his immunization history and has no record of immunization should receive the PCV13 vaccine. All adults 65 years of age and older should receive this vaccine. An adult aged 19 years or older who has certain medical  conditions and has not been previously immunized should receive 1 dose of PCV13 vaccine. This PCV13 should be followed with a dose of pneumococcal polysaccharide (PPSV23) vaccine. Adults who are at high risk for pneumococcal disease should obtain the PPSV23 vaccine at least 8 weeks after the dose of PCV13 vaccine. Adults older than 70 years of age who have normal immune system function should obtain the PPSV23 vaccine dose at least 1 year after the dose of PCV13 vaccine.  Pneumococcal polysaccharide (PPSV23) vaccine. When PCV13 is also indicated, PCV13 should be obtained first. All adults aged 65 years and older should be immunized. An adult younger than age 65 years who has certain medical conditions should be immunized. Any person who resides in a nursing home or long-term care facility should be immunized. An adult smoker should be immunized. People with an immunocompromised condition and certain other conditions should receive both PCV13 and PPSV23 vaccines. People with human immunodeficiency virus (HIV) infection should be immunized as soon as possible after diagnosis. Immunization during chemotherapy or radiation therapy should be avoided. Routine use of PPSV23 vaccine is not recommended for American Indians, [place] Natives, or people younger than 65 years unless there are medical conditions that require PPSV23 vaccine. When indicated, people who have unknown immunization and have no record of immunization should receive PPSV23 vaccine. One-time revaccination 5 years after the first dose of PPSV23 is recommended for people aged 19-64 years who have chronic kidney failure, nephrotic syndrome, asplenia, or immunocompromised conditions. People who received 1-2 doses of PPSV23 before age 65 years should receive another dose of PPSV23 vaccine at age 65 years or later if at least 5 years have passed since the previous dose. Doses of PPSV23 are not needed for people immunized with PPSV23 at or after age 65  years.  Meningococcal vaccine. Adults with asplenia or persistent complement component deficiencies should receive 2 doses of quadrivalent meningococcal conjugate (MenACWY-D) vaccine. The doses should be obtained at least 2 months apart. Microbiologists working with certain meningococcal bacteria, military recruits, people at risk during an outbreak, and people who travel to or live in countries with a high rate of meningitis should be immunized. A first-year college student up through age 21 years who is living in a residence hall should receive a   receive a dose if he did not receive a dose on or after his 16th birthday. Adults who have certain high-risk conditions should receive one or more doses of vaccine.  Hepatitis A vaccine. Adults who wish to be protected from this disease, have chronic liver disease, work with hepatitis A-infected animals, work in hepatitis A research labs, or travel to or work in countries with a high rate of hepatitis A should be immunized. Adults who were previously unvaccinated and who anticipate close contact with an international adoptee during the first 60 days after arrival in the [place] from a country with a high rate of hepatitis A should be immunized.  Hepatitis B vaccine. Adults should be immunized if they wish to be protected from this disease, are under age 90 years and have diabetes, have chronic liver disease, have had more than one sex partner in the past 6 months, may be exposed to blood or other infectious body fluids, are household contacts or sex partners of hepatitis B positive people, are clients or workers in certain care facilities, or travel to or work in countries with a high rate of hepatitis B.  Haemophilus influenzae type b (Hib) vaccine. A previously unvaccinated person with asplenia or sickle cell disease or having a scheduled splenectomy should receive 1 dose of Hib vaccine. Regardless of previous immunization, a recipient of a hematopoietic stem cell  transplant should receive a 3-dose series 6-12 months after his successful transplant. Hib vaccine is not recommended for adults with HIV infection. Preventive Service / Frequency Ages 14 to 65  Blood pressure check.** / Every 3-5 years.  Lipid and cholesterol check.** / Every 5 years beginning at age 49.  Hepatitis C blood test.** / For any individual with known risks for hepatitis C.  Skin self-exam. / Monthly.  Influenza vaccine. / Every year.  Tetanus, diphtheria, and acellular pertussis (Tdap, Td) vaccine.** / Consult your health care provider. 1 dose of Td every 10 years.  Varicella vaccine.** / Consult your health care provider.  HPV vaccine. / 3 doses over 6 months, if 28 or younger.  Measles, mumps, rubella (MMR) vaccine.** / You need at least 1 dose of MMR if you were born in 1957 or later. You may also need a second dose.  Pneumococcal 13-valent conjugate (PCV13) vaccine.** / Consult your health care provider.  Pneumococcal polysaccharide (PPSV23) vaccine.** / 1 to 2 doses if you smoke cigarettes or if you have certain conditions.  Meningococcal vaccine.** / 1 dose if you are age 20 to 81 years and a Market researcher living in a residence hall, or have one of several medical conditions. You may also need additional booster doses.  Hepatitis A vaccine.** / Consult your health care provider.  Hepatitis B vaccine.** / Consult your health care provider.  Haemophilus influenzae type b (Hib) vaccine.** / Consult your health care provider. Ages 69 to 76  Blood pressure check.** / Every year.  Lipid and cholesterol check.** / Every 5 years beginning at age 51.  Lung cancer screening. / Every year if you are aged 108-80 years and have a 30-pack-year history of smoking and currently smoke or have quit within the past 15 years. Yearly screening is stopped once you have quit smoking for at least 15 years or develop a health problem that would prevent you from having  lung cancer treatment.  Fecal occult blood test (FOBT) of stool. / Every year beginning at age 56 and continuing until age 58. You may not have  to do this test if you get a colonoscopy every 10 years.  Flexible sigmoidoscopy** or colonoscopy.** / Every 5 years for a flexible sigmoidoscopy or every 10 years for a colonoscopy beginning at age 31 and continuing until age 65.  Hepatitis C blood test.** / For all people born from [birth year] through 1965 and any individual with known risks for hepatitis C.  Skin self-exam. / Monthly.  Influenza vaccine. / Every year.  Tetanus, diphtheria, and acellular pertussis (Tdap/Td) vaccine.** / Consult your health care provider. 1 dose of Td every 10 years.  Varicella vaccine.** / Consult your health care provider.  Zoster vaccine.** / 1 dose for adults aged 14 years or older.  Measles, mumps, rubella (MMR) vaccine.** / You need at least 1 dose of MMR if you were born in 1957 or later. You may also need a second dose.  Pneumococcal 13-valent conjugate (PCV13) vaccine.** / Consult your health care provider.  Pneumococcal polysaccharide (PPSV23) vaccine.** / 1 to 2 doses if you smoke cigarettes or if you have certain conditions.  Meningococcal vaccine.** / Consult your health care provider.  Hepatitis A vaccine.** / Consult your health care provider.  Hepatitis B vaccine.** / Consult your health care provider.  Haemophilus influenzae type b (Hib) vaccine.** / Consult your health care provider. Ages 14 and over  Blood pressure check.** / Every year.  Lipid and cholesterol check.**/ Every 5 years beginning at age 57.  Lung cancer screening. / Every year if you are aged 28-80 years and have a 30-pack-year history of smoking and currently smoke or have quit within the past 15 years. Yearly screening is stopped once you have quit smoking for at least 15 years or develop a health problem that would prevent you from having lung cancer treatment.  Fecal  occult blood test (FOBT) of stool. / Every year beginning at age 45 and continuing until age 83. You may not have to do this test if you get a colonoscopy every 10 years.  Flexible sigmoidoscopy** or colonoscopy.** / Every 5 years for a flexible sigmoidoscopy or every 10 years for a colonoscopy beginning at age 42 and continuing until age 8.  Hepatitis C blood test.** / For all people born from [birth year] through 1965 and any individual with known risks for hepatitis C.  Abdominal aortic aneurysm (AAA) screening.** / A one-time screening for ages 69 to 35 years who are current or former smokers.  Skin self-exam. / Monthly.  Influenza vaccine. / Every year.  Tetanus, diphtheria, and acellular pertussis (Tdap/Td) vaccine.** / 1 dose of Td every 10 years.  Varicella vaccine.** / Consult your health care provider.  Zoster vaccine.** / 1 dose for adults aged 27 years or older.  Pneumococcal 13-valent conjugate (PCV13) vaccine.** / 1 dose for all adults aged 11 years and older.  Pneumococcal polysaccharide (PPSV23) vaccine.** / 1 dose for all adults aged 77 years and older.  Meningococcal vaccine.** / Consult your health care provider.  Hepatitis A vaccine.** / Consult your health care provider.  Hepatitis B vaccine.** / Consult your health care provider.  Haemophilus influenzae type b (Hib) vaccine.** / Consult your health care provider. **Family history and personal history of risk and conditions may change your health care provider's recommendations.   This information is not intended to replace advice given to you by your health care provider. Make sure you discuss any questions you have with your health care provider.   Document Released: 12/31/2001 Document Revised: 11/25/2014 Document Reviewed: 04/01/2011 Elsevier Interactive Patient  Education 2016 [employer].  Postprocedure instructions :     Keep the wounds clean. You can wash them with liquid soap and water. Pat dry with gauze  or a Kleenex tissue  Before applying antibiotic ointment and a Band-Aid.   You need to report immediately  if  any signs of infection develop.

## 2015-12-22 NOTE — Progress Notes (Signed)
Pre visit review using our clinic review tool, if applicable. No additional management support is needed unless otherwise documented below in the visit note. 

## 2015-12-24 NOTE — Assessment & Plan Note (Signed)
See Cryo 

## 2016-01-11 ENCOUNTER — Ambulatory Visit (INDEPENDENT_AMBULATORY_CARE_PROVIDER_SITE_OTHER): Payer: Medicare Other | Admitting: Ophthalmology

## 2016-01-17 ENCOUNTER — Ambulatory Visit (INDEPENDENT_AMBULATORY_CARE_PROVIDER_SITE_OTHER): Payer: Medicare Other | Admitting: Ophthalmology

## 2016-01-17 DIAGNOSIS — I10 Essential (primary) hypertension: Secondary | ICD-10-CM | POA: Diagnosis not present

## 2016-01-17 DIAGNOSIS — H338 Other retinal detachments: Secondary | ICD-10-CM | POA: Diagnosis not present

## 2016-01-17 DIAGNOSIS — H59031 Cystoid macular edema following cataract surgery, right eye: Secondary | ICD-10-CM | POA: Diagnosis not present

## 2016-01-17 DIAGNOSIS — H35033 Hypertensive retinopathy, bilateral: Secondary | ICD-10-CM

## 2016-01-17 DIAGNOSIS — H43813 Vitreous degeneration, bilateral: Secondary | ICD-10-CM | POA: Diagnosis not present

## 2016-02-14 ENCOUNTER — Other Ambulatory Visit: Payer: Self-pay | Admitting: Internal Medicine

## 2016-03-13 ENCOUNTER — Encounter (INDEPENDENT_AMBULATORY_CARE_PROVIDER_SITE_OTHER): Payer: Medicare Other | Admitting: Ophthalmology

## 2016-03-13 DIAGNOSIS — H59031 Cystoid macular edema following cataract surgery, right eye: Secondary | ICD-10-CM | POA: Diagnosis not present

## 2016-03-13 DIAGNOSIS — H43813 Vitreous degeneration, bilateral: Secondary | ICD-10-CM | POA: Diagnosis not present

## 2016-03-13 DIAGNOSIS — H338 Other retinal detachments: Secondary | ICD-10-CM

## 2016-03-21 ENCOUNTER — Ambulatory Visit: Payer: Medicare Other | Admitting: Internal Medicine

## 2016-05-09 ENCOUNTER — Ambulatory Visit: Payer: Medicare Other | Admitting: Podiatry

## 2016-05-17 ENCOUNTER — Ambulatory Visit (INDEPENDENT_AMBULATORY_CARE_PROVIDER_SITE_OTHER): Payer: Medicare Other | Admitting: Podiatry

## 2016-05-17 ENCOUNTER — Ambulatory Visit (INDEPENDENT_AMBULATORY_CARE_PROVIDER_SITE_OTHER): Payer: Medicare Other

## 2016-05-17 DIAGNOSIS — M779 Enthesopathy, unspecified: Secondary | ICD-10-CM | POA: Diagnosis not present

## 2016-05-17 DIAGNOSIS — B079 Viral wart, unspecified: Secondary | ICD-10-CM | POA: Diagnosis not present

## 2016-05-17 DIAGNOSIS — M2040 Other hammer toe(s) (acquired), unspecified foot: Secondary | ICD-10-CM

## 2016-05-17 DIAGNOSIS — B078 Other viral warts: Secondary | ICD-10-CM

## 2016-05-17 MED ORDER — TRIAMCINOLONE ACETONIDE 10 MG/ML IJ SUSP
10.0000 mg | Freq: Once | INTRAMUSCULAR | Status: AC
  Administered 2016-05-17: 10 mg

## 2016-05-17 NOTE — Progress Notes (Signed)
   Subjective:    Patient ID: Roger Kent, male    DOB: 1946/08/07, 70 y.o.   MRN: [id number]  HPI    Review of Systems  Musculoskeletal: Positive for myalgias.  All other systems reviewed and are negative.      Objective:   Physical Exam        Assessment & Plan:

## 2016-05-17 NOTE — Progress Notes (Signed)
Subjective:     Patient ID: Roger Kent, male   DOB: Dec 28, 1945, 70 y.o.   MRN: [id number]  HPI patient presents stating that he's getting a lot of pain in her second toe left foot and that it's become inflamed and making it hard to walk and that also the bottom of the left foot he is getting problems with the lesion. Also complains about some fungal toenails that he was concerned about   Review of Systems  All other systems reviewed and are negative.      Objective:   Physical Exam  Constitutional: He is oriented to person, place, and time.  Cardiovascular: Intact distal pulses.   Musculoskeletal: Normal range of motion.  Neurological: He is oriented to person, place, and time.  Skin: Skin is warm.  Nursing note and vitals reviewed.  neurovascular status intact muscle strength adequate range of motion within normal limits with patient found to have significant structural malalignment left over right foot with elevation of the digits and pain. Patient's found have lesion plantar aspect left heel that's painful when pressed and on the inner side of the second digit left there is fluid buildup around the interphalangeal joint that bothers him and makes it at times hard to wear shoes. He has some nail disease which has not bothered him and tried previous Lamisil which she was not able to tolerate and is noted to have good digital perfusion and is well oriented 3     Assessment:     Inflammatory changes left second digit with fluid buildup at the interphalangeal joint medial side with plantar lesion left that upon debridement shows pinpoint bleeding and pain to lateral pressure    Plan:     H&P and all conditions and x-rays reviewed. Today I did a proximal nerve block left and then infiltrated the interphalangeal joint with 2 mg dexamethasone Kenalog debrided lesion and applied padding and then for the heel debrided the lesion and applied a chemical agent to create an immune response.  I instructed on physical therapy for this recommend treatment of the nails as I believe it is more trauma in its orientation. Will reappoint as symptoms indicate  X-ray report indicates that he's had previous implant surgery left first MPJ and has abnormal positioning of the second toe left with compression between the hallux and second toe

## 2016-05-22 DIAGNOSIS — R972 Elevated prostate specific antigen [PSA]: Secondary | ICD-10-CM | POA: Diagnosis not present

## 2016-05-29 DIAGNOSIS — N3281 Overactive bladder: Secondary | ICD-10-CM | POA: Diagnosis not present

## 2016-05-29 DIAGNOSIS — R972 Elevated prostate specific antigen [PSA]: Secondary | ICD-10-CM | POA: Diagnosis not present

## 2016-06-12 ENCOUNTER — Ambulatory Visit (INDEPENDENT_AMBULATORY_CARE_PROVIDER_SITE_OTHER): Payer: Medicare Other | Admitting: Podiatry

## 2016-06-12 ENCOUNTER — Encounter: Payer: Self-pay | Admitting: Podiatry

## 2016-06-12 DIAGNOSIS — B070 Plantar wart: Secondary | ICD-10-CM | POA: Diagnosis not present

## 2016-06-12 DIAGNOSIS — B078 Other viral warts: Secondary | ICD-10-CM

## 2016-06-12 DIAGNOSIS — B079 Viral wart, unspecified: Secondary | ICD-10-CM

## 2016-06-12 DIAGNOSIS — M779 Enthesopathy, unspecified: Secondary | ICD-10-CM | POA: Diagnosis not present

## 2016-06-12 NOTE — Progress Notes (Signed)
Subjective:     Patient ID: Roger Kent, male   DOB: May 07, 1946, 70 y.o.   MRN: [id number]  HPI patient states the lesion on my heel is still sore when pressed but improving and I think it's responding to medication   Review of Systems     Objective:   Physical Exam Neurovascular status intact muscle strength adequate with lesion plantar left that upon debridement shows pinpoint bleeding with pain to lateral pressure     Assessment:     Verruca plantaris plantar left     Plan:     Deep debridement accomplished with sterile sharp instrumentation and medication administered to create an immune response with sterile dressing

## 2016-06-14 ENCOUNTER — Ambulatory Visit: Payer: Medicare Other | Admitting: Podiatry

## 2016-06-19 ENCOUNTER — Telehealth: Payer: Self-pay | Admitting: *Deleted

## 2016-06-19 NOTE — Telephone Encounter (Addendum)
Pt's wife, [name] states she has questions about the Revitaderm40. Left message to call again.  06/20/2016-Left message to call again. Pt called left message stating she was wanting to know how to purchase the Revitaderm40. I left message that it could be purchased in any [facility] office, but currently the [place] office was out of stock and to call before coming in.

## 2016-06-21 ENCOUNTER — Ambulatory Visit (INDEPENDENT_AMBULATORY_CARE_PROVIDER_SITE_OTHER): Payer: Medicare Other | Admitting: Internal Medicine

## 2016-06-21 ENCOUNTER — Encounter: Payer: Self-pay | Admitting: Internal Medicine

## 2016-06-21 ENCOUNTER — Other Ambulatory Visit (INDEPENDENT_AMBULATORY_CARE_PROVIDER_SITE_OTHER): Payer: Medicare Other

## 2016-06-21 ENCOUNTER — Ambulatory Visit (INDEPENDENT_AMBULATORY_CARE_PROVIDER_SITE_OTHER)
Admission: RE | Admit: 2016-06-21 | Discharge: 2016-06-21 | Disposition: A | Discharge status: Home / Self Care | Payer: Medicare Other | Source: Ambulatory Visit | Attending: Internal Medicine | Admitting: Internal Medicine

## 2016-06-21 VITALS — BP 122/70 | HR 64 | Temp 97.7°F | Resp 18 | Ht 73.0 in | Wt 183.8 lb

## 2016-06-21 DIAGNOSIS — I10 Essential (primary) hypertension: Secondary | ICD-10-CM | POA: Diagnosis not present

## 2016-06-21 DIAGNOSIS — M2042 Other hammer toe(s) (acquired), left foot: Secondary | ICD-10-CM

## 2016-06-21 DIAGNOSIS — M810 Age-related osteoporosis without current pathological fracture: Secondary | ICD-10-CM

## 2016-06-21 DIAGNOSIS — M722 Plantar fascial fibromatosis: Secondary | ICD-10-CM

## 2016-06-21 DIAGNOSIS — B079 Viral wart, unspecified: Secondary | ICD-10-CM

## 2016-06-21 LAB — BASIC METABOLIC PANEL
BUN: 18 mg/dL (ref 6–23)
CHLORIDE: 103 meq/L (ref 96–112)
CO2: 31 mEq/L (ref 19–32)
Calcium: 9.6 mg/dL (ref 8.4–10.5)
Creatinine, Ser: 1.09 mg/dL (ref 0.40–1.50)
GFR: 71.01 mL/min (ref >60.00)
Glucose, Bld: 97 mg/dL (ref 70–99)
POTASSIUM: 4.3 meq/L (ref 3.5–5.1)
Sodium: 140 mEq/L (ref 135–145)

## 2016-06-21 LAB — TSH: TSH: 2.23 u[IU]/mL (ref 0.35–4.50)

## 2016-06-21 LAB — TESTOSTERONE: Testosterone: 566.03 ng/dL (ref 300.00–890.00)

## 2016-06-21 NOTE — Assessment & Plan Note (Signed)
seeing Dr Paulla Dolly

## 2016-06-21 NOTE — Assessment & Plan Note (Signed)
Repeat BDS Check labs

## 2016-06-21 NOTE — Assessment & Plan Note (Signed)
L heel wart - seeing Dr Paulla Dolly

## 2016-06-21 NOTE — Progress Notes (Signed)
Pre visit review using our clinic review tool, if applicable. No additional management support is needed unless otherwise documented below in the visit note. 

## 2016-06-21 NOTE — Progress Notes (Signed)
Subjective:  Patient ID: Roger Kent, male    DOB: 02-24-1946  Age: 70 y.o. MRN: [id number]  CC: No chief complaint on file.   HPI Roger Kent presents for L heel wart, L 2nd hammer toe - seeing Dr Paulla Dolly. F/u HTN, allergies.  Outpatient Medications Prior to Visit  Medication Sig Dispense Refill  . cetirizine (ZYRTEC) 10 MG tablet Take 10 mg by mouth as needed for allergies.     . cholecalciferol (VITAMIN D) 1000 UNITS tablet Take 1,000 Units by mouth every morning.    Marland Kitchen CRANBERRY PO Take 1 capsule by mouth daily.    . DiphenhydrAMINE HCl, Sleep, 25 MG TBDP Take 50 mg by mouth at bedtime.    Marland Kitchen glucosamine-chondroitin 500-400 MG tablet Take 2 tablets by mouth every morning.     . montelukast (SINGULAIR) 10 MG tablet TAKE 1 TABLET BY MOUTH DAILY. 90 tablet 3  . Multiple Vitamin (MULTIVITAMIN WITH MINERALS) TABS tablet Take 1 tablet by mouth every morning.    . olmesartan (BENICAR) 40 MG tablet Take 1 tablet (40 mg total) by mouth daily. (Patient taking differently: Take 20 mg by mouth daily. pateint taking 1/2 tablet) 90 tablet 3  . omeprazole-sodium bicarbonate (ZEGERID) 40-1100 MG per capsule Take 1 capsule by mouth daily before breakfast.    . OVER THE COUNTER MEDICATION Take 3 capsules by mouth every morning. OTC herbal supplement for prostate health    . naproxen sodium (ANAPROX) 220 MG tablet Take 220 mg by mouth 2 (two) times daily.     Marland Kitchen OVER THE COUNTER MEDICATION Ultimate Bladder Support 1 each by mouth daily.     No facility-administered medications prior to visit.     ROS Review of Systems  Constitutional: Negative for appetite change, fatigue and unexpected weight change.  HENT: Negative for congestion, nosebleeds, sneezing, sore throat and trouble swallowing.   Eyes: Negative for itching and visual disturbance.  Respiratory: Negative for cough.   Cardiovascular: Negative for chest pain, palpitations and leg swelling.  Gastrointestinal: Negative for  abdominal distention, blood in stool, diarrhea and nausea.  Genitourinary: Negative for frequency and hematuria.  Musculoskeletal: Positive for arthralgias and gait problem. Negative for back pain, joint swelling and neck pain.  Skin: Negative for rash.  Neurological: Negative for dizziness, tremors, speech difficulty and weakness.  Psychiatric/Behavioral: Negative for agitation, dysphoric mood, sleep disturbance and suicidal ideas. The patient is not nervous/anxious.     Objective:  BP 122/70   Pulse 64   Temp 97.7 F (36.5 C) (Oral)   Resp 18   Ht 6\' 1"  (1.854 m)   Wt 183 lb 12.8 oz (83.4 kg)   SpO2 95%   BMI 24.25 kg/m   BP Readings from Last 3 Encounters:  06/21/16 122/70  12/22/15 (!) 148/88  11/22/15 (!) 142/92    Wt Readings from Last 3 Encounters:  06/21/16 183 lb 12.8 oz (83.4 kg)  12/22/15 187 lb (84.8 kg)  11/22/15 190 lb (86.2 kg)    Physical Exam  Constitutional: He is oriented to person, place, and time. He appears well-developed. No distress.  NAD  HENT:  Mouth/Throat: Oropharynx is clear and moist.  Eyes: Conjunctivae are normal. Pupils are equal, round, and reactive to light.  Neck: Normal range of motion. No JVD present. No thyromegaly present.  Cardiovascular: Normal rate, regular rhythm, normal heart sounds and intact distal pulses.  Exam reveals no gallop and no friction rub.   No murmur heard. Pulmonary/Chest: Effort normal and  breath sounds normal. No respiratory distress. He has no wheezes. He has no rales. He exhibits no tenderness.  Abdominal: Soft. Bowel sounds are normal. He exhibits no distension and no mass. There is no tenderness. There is no rebound and no guarding.  Musculoskeletal: Normal range of motion. He exhibits tenderness. He exhibits no edema.  Lymphadenopathy:    He has no cervical adenopathy.  Neurological: He is alert and oriented to person, place, and time. He has normal reflexes. No cranial nerve deficit. He exhibits normal  muscle tone. He displays a negative Romberg sign. Gait normal.  Skin: Skin is warm and dry. No rash noted.  Psychiatric: He has a normal mood and affect. His behavior is normal. Judgment and thought content normal.  L>R knee w/mild swelling, tender L heel wart - treated; hammer toe 2nd L  Lab Results  Component Value Date   WBC 9.8 12/22/2015   HGB 14.9 12/22/2015   HCT 45.1 12/22/2015   PLT 305.0 12/22/2015   GLUCOSE 84 11/22/2015   CHOL 148 12/22/2015   TRIG 96.0 12/22/2015   HDL 37.70 (L) 12/22/2015   LDLCALC 92 12/22/2015   ALT 14 11/22/2015   AST 17 11/22/2015   NA 140 11/22/2015   K 4.5 11/22/2015   CL 105 11/22/2015   CREATININE 1.05 11/22/2015   BUN 17 11/22/2015   CO2 28 11/22/2015   TSH 1.92 12/22/2015   PSA 8.08 (H) 12/22/2015   INR 2.4 RATIO (H) 09/17/2007   HGBA1C 5.6 12/22/2015    No results found.  Assessment & Plan:   There are no diagnoses linked to this encounter. I am having Roger Kent maintain his cetirizine, cholecalciferol, OVER THE COUNTER MEDICATION, glucosamine-chondroitin, multivitamin with minerals, omeprazole-sodium bicarbonate, naproxen sodium, DiphenhydrAMINE HCl (Sleep), olmesartan, CRANBERRY PO, OVER THE COUNTER MEDICATION, and montelukast.  No orders of the defined types were placed in this encounter.    Follow-up: No Follow-up on file.  Walker Kehr, MD

## 2016-06-21 NOTE — Assessment & Plan Note (Signed)
On [employer]

## 2016-07-15 ENCOUNTER — Ambulatory Visit (INDEPENDENT_AMBULATORY_CARE_PROVIDER_SITE_OTHER): Payer: Medicare Other | Admitting: Ophthalmology

## 2016-07-15 DIAGNOSIS — H43813 Vitreous degeneration, bilateral: Secondary | ICD-10-CM

## 2016-07-15 DIAGNOSIS — H59031 Cystoid macular edema following cataract surgery, right eye: Secondary | ICD-10-CM

## 2016-07-15 DIAGNOSIS — H338 Other retinal detachments: Secondary | ICD-10-CM

## 2016-07-15 DIAGNOSIS — H35033 Hypertensive retinopathy, bilateral: Secondary | ICD-10-CM

## 2016-07-15 DIAGNOSIS — I10 Essential (primary) hypertension: Secondary | ICD-10-CM

## 2016-07-17 ENCOUNTER — Encounter: Payer: Self-pay | Admitting: Podiatry

## 2016-07-17 ENCOUNTER — Ambulatory Visit (INDEPENDENT_AMBULATORY_CARE_PROVIDER_SITE_OTHER): Payer: Medicare Other

## 2016-07-17 ENCOUNTER — Ambulatory Visit (INDEPENDENT_AMBULATORY_CARE_PROVIDER_SITE_OTHER): Payer: Medicare Other | Admitting: Podiatry

## 2016-07-17 DIAGNOSIS — M79671 Pain in right foot: Secondary | ICD-10-CM

## 2016-07-17 DIAGNOSIS — M779 Enthesopathy, unspecified: Secondary | ICD-10-CM

## 2016-07-17 DIAGNOSIS — M2040 Other hammer toe(s) (acquired), unspecified foot: Secondary | ICD-10-CM

## 2016-07-17 NOTE — Progress Notes (Signed)
Subjective:     Patient ID: Roger Kent, male   DOB: [birth year], 70 y.o.   MRN: [id number]  HPI patient states the left heel is doing much better where the wart was worked on and he continues to have trouble with the second toe of his left foot and third toe with lesion formation and pain   Review of Systems     Objective:   Physical Exam Neurovascular status intact with patient's plantar left heel doing much better with lesion formation that's improved with hammertoe deformity second left with large keratotic lesion and also third left distal    Assessment:     Doing better with wart left with patient noted to have her toe deformity second left and third left    Plan:     Reviewed condition and recommended that long-term he needs digital fusion second left and distal arthroplasty digit third left. I took x-rays to see where it stands now and we discussed procedures that we'll need to be done and patient will be seen back to review  X-rays indicate significant structural malalignment second toe left with distal deformity third toe left foot

## 2016-08-28 ENCOUNTER — Ambulatory Visit (INDEPENDENT_AMBULATORY_CARE_PROVIDER_SITE_OTHER): Payer: Medicare Other | Admitting: Internal Medicine

## 2016-08-28 ENCOUNTER — Encounter: Payer: Self-pay | Admitting: Internal Medicine

## 2016-08-28 DIAGNOSIS — J209 Acute bronchitis, unspecified: Secondary | ICD-10-CM

## 2016-08-28 MED ORDER — DOXYCYCLINE HYCLATE 100 MG PO TABS: 100.0000 mg | ORAL_TABLET | Freq: Two times a day (BID) | ORAL | 0 refills | Status: DC

## 2016-08-28 NOTE — Progress Notes (Signed)
Pre visit review using our clinic review tool, if applicable. No additional management support is needed unless otherwise documented below in the visit note. 

## 2016-08-28 NOTE — Progress Notes (Signed)
Subjective:  Patient ID: Roger Kent, male    DOB: [birth year]  Age: 70 y.o. MRN: [id number]  CC: No chief complaint on file.   HPI Roger Kent presents for a productive cough  X 1.5weeks. They came back from a trip to the [place]. Wife was dx'd w/CAP. Feeling 50% better  Outpatient Medications Prior to Visit  Medication Sig Dispense Refill  . cetirizine (ZYRTEC) 10 MG tablet Take 10 mg by mouth as needed for allergies.     . cholecalciferol (VITAMIN D) 1000 UNITS tablet Take 1,000 Units by mouth every morning.    Marland Kitchen CRANBERRY PO Take 1 capsule by mouth daily.    . DiphenhydrAMINE HCl, Sleep, 25 MG TBDP Take 50 mg by mouth at bedtime.    Marland Kitchen glucosamine-chondroitin 500-400 MG tablet Take 2 tablets by mouth every morning.     . montelukast (SINGULAIR) 10 MG tablet TAKE 1 TABLET BY MOUTH DAILY. 90 tablet 3  . Multiple Vitamin (MULTIVITAMIN WITH MINERALS) TABS tablet Take 1 tablet by mouth every morning.    . olmesartan (BENICAR) 40 MG tablet Take 1 tablet (40 mg total) by mouth daily. (Patient taking differently: Take 20 mg by mouth at bedtime. pateint taking 1/2 tablet) 90 tablet 3  . omeprazole-sodium bicarbonate (ZEGERID) 40-1100 MG per capsule Take 1 capsule by mouth daily before breakfast.    . OVER THE COUNTER MEDICATION Take 3 capsules by mouth every morning. OTC herbal supplement for prostate health    . OVER THE COUNTER MEDICATION Ultimate Bladder Support 1 each by mouth daily.    . naproxen sodium (ANAPROX) 220 MG tablet Take 220 mg by mouth 2 (two) times daily.      No facility-administered medications prior to visit.     ROS Review of Systems  Constitutional: Positive for fatigue. Negative for appetite change and unexpected weight change.  HENT: Negative for congestion, nosebleeds, sneezing, sore throat and trouble swallowing.   Eyes: Negative for itching and visual disturbance.  Respiratory: Positive for cough. Negative for wheezing.   Cardiovascular: Negative  for chest pain, palpitations and leg swelling.  Gastrointestinal: Negative for abdominal distention, blood in stool, diarrhea and nausea.  Genitourinary: Negative for frequency and hematuria.  Musculoskeletal: Negative for back pain, gait problem, joint swelling and neck pain.  Skin: Negative for rash.  Neurological: Negative for dizziness, tremors, speech difficulty and weakness.  Psychiatric/Behavioral: Negative for agitation, dysphoric mood and sleep disturbance. The patient is not nervous/anxious.     Objective:  BP 120/80   Pulse 79   Temp 98.5 F (36.9 C) (Oral)   Wt 187 lb (84.8 kg)   SpO2 97%   BMI 24.67 kg/m   BP Readings from Last 3 Encounters:  08/28/16 120/80  06/21/16 122/70  12/22/15 (!) 148/88    Wt Readings from Last 3 Encounters:  08/28/16 187 lb (84.8 kg)  06/21/16 183 lb 12.8 oz (83.4 kg)  12/22/15 187 lb (84.8 kg)    Physical Exam  Constitutional: He is oriented to person, place, and time. He appears well-developed. No distress.  NAD  HENT:  Mouth/Throat: Oropharynx is clear and moist.  Eyes: Conjunctivae are normal. Pupils are equal, round, and reactive to light.  Neck: Normal range of motion. No JVD present. No thyromegaly present.  Cardiovascular: Normal rate, regular rhythm, normal heart sounds and intact distal pulses.  Exam reveals no gallop and no friction rub.   No murmur heard. Pulmonary/Chest: Effort normal and breath sounds normal. No respiratory distress.  He has no wheezes. He has no rales. He exhibits no tenderness.  Abdominal: Soft. Bowel sounds are normal. He exhibits no distension and no mass. There is no tenderness. There is no rebound and no guarding.  Musculoskeletal: Normal range of motion. He exhibits no edema or tenderness.  Lymphadenopathy:    He has no cervical adenopathy.  Neurological: He is alert and oriented to person, place, and time. He has normal reflexes. No cranial nerve deficit. He exhibits normal muscle tone. He  displays a negative Romberg sign. Coordination and gait normal.  Skin: Skin is warm and dry. No rash noted.  Psychiatric: He has a normal mood and affect. His behavior is normal. Judgment and thought content normal.  eryth throat  Lab Results  Component Value Date   WBC 9.8 12/22/2015   HGB 14.9 12/22/2015   HCT 45.1 12/22/2015   PLT 305.0 12/22/2015   GLUCOSE 97 06/21/2016   CHOL 148 12/22/2015   TRIG 96.0 12/22/2015   HDL 37.70 (L) 12/22/2015   LDLCALC 92 12/22/2015   ALT 14 11/22/2015   AST 17 11/22/2015   NA 140 06/21/2016   K 4.3 06/21/2016   CL 103 06/21/2016   CREATININE 1.09 06/21/2016   BUN 18 06/21/2016   CO2 31 06/21/2016   TSH 2.23 06/21/2016   PSA 8.08 (H) 12/22/2015   INR 2.4 RATIO (H) 09/17/2007   HGBA1C 5.6 12/22/2015    Dg Bone Density  Result Date: 06/25/2016 Date of study: 06/21/2016 Exam: DUAL X-RAY ABSORPTIOMETRY (DXA) FOR BONE MINERAL DENSITY (BMD) Instrument: [employer] Requesting Provider: PCP Indication: follow up for low BMD Comparison: 11/15/2010 Clinical data: Pt is a 70 y.o. male with previous h/o finger fracture. On calcium and vitamin D. Results:  Lumbar spine L1-L4(L3) Femoral neck (FN) T-score +0.5  RFN:-1.0 LFN:-1.5  Change in BMD from previous DXA test (%) +2.7%* -2.3%  (*) statistically significant Assessment: the BMD is low according to the [facility] classification for osteoporosis (see below). Fracture risk: moderate FRAX score: 10 year major osteoporotic risk: 10.6 %. 10 year hip fracture risk: 3.1 %. The thresholds for treatment are 20% and 3%, respectively. Comments: the technical quality of the study is good, however, the spine is scoliotic and arthritic. Calcium accumulation in arthritic sites can confound the results of the bone density scan. L3 vertebra had to be excluded from analysis due to degenerative changes. Evaluation for secondary causes should be considered if clinically indicated. Recommend optimizing calcium (1200 mg/day) and  vitamin D (800 IU/day) intake. Followup: Repeat BMD is appropriate after 1-2 years WHO criteria for diagnosis of osteoporosis in postmenopausal women and in men 28 y/o or older: - normal: T-score -1.0 to + 1.0 - osteopenia/low bone density: T-score between -2.5 and -1.0 - osteoporosis: T-score below -2.5 - severe osteoporosis: T-score below -2.5 with history of fragility fracture Note: although not part of the WHO classification, the presence of a fragility fracture, regardless of the T-score, should be considered diagnostic of osteoporosis, provided other causes for the fracture have been excluded. Treatment: The National Osteoporosis Foundation recommends that treatment be considered in postmenopausal women and men age 36 or older with: 1. Hip or vertebral (clinical or morphometric) fracture 2. T-score of - 2.5 or lower at the spine or hip 3. 10-year fracture probability by FRAX of at least 20% for a major osteoporotic fracture and 3% for a hip fracture Philemon Kingdom, MD [facility]    Assessment & Plan:   There are no diagnoses  linked to this encounter. I am having Mr. Hoit maintain his cetirizine, cholecalciferol, OVER THE COUNTER MEDICATION, glucosamine-chondroitin, multivitamin with minerals, omeprazole-sodium bicarbonate, naproxen sodium, DiphenhydrAMINE HCl (Sleep), olmesartan, CRANBERRY PO, OVER THE COUNTER MEDICATION, and montelukast.  No orders of the defined types were placed in this encounter.    Follow-up: No Follow-up on file.  Walker Kehr, MD

## 2016-08-28 NOTE — Assessment & Plan Note (Signed)
Start Doxy if worse Pt declined CXR

## 2016-08-28 NOTE — Patient Instructions (Signed)
Use over-the-counter  "cold" medicines  such as  "Afrin" nasal spray for nasal congestion as directed instead. Use" Delsym" or" Robitussin" cough syrup varietis for cough.  You can use plain "Tylenol" or "Advil" for fever, chills and achyness. Use Halls or Ricola cough drops.   "Common cold" symptoms are usually triggered by a virus.  The antibiotics are usually not necessary.

## 2016-09-06 ENCOUNTER — Other Ambulatory Visit: Payer: Self-pay | Admitting: Internal Medicine

## 2016-09-07 ENCOUNTER — Ambulatory Visit (INDEPENDENT_AMBULATORY_CARE_PROVIDER_SITE_OTHER): Payer: Medicare Other

## 2016-09-07 DIAGNOSIS — Z23 Encounter for immunization: Secondary | ICD-10-CM | POA: Diagnosis not present

## 2016-09-19 ENCOUNTER — Telehealth: Payer: Self-pay | Admitting: *Deleted

## 2016-09-19 NOTE — Telephone Encounter (Signed)
"  I have a tentative with Dr. Paulla Dolly for a surgery in January.  My wife and I have canceled our trip.  So, I would like to schedule it as soon as possible.  I know I have to wear a boot for about 5 weeks.  Please give me a call.  I know I have to meet with the doctor."

## 2016-09-20 NOTE — Telephone Encounter (Signed)
"  Nikodem Aaberg, thank you have a nice day."

## 2016-09-23 NOTE — Telephone Encounter (Signed)
"  I'd like to reschedule my surgery from 12/03/2016 to as soon as possible.  I understand I will not be able to get about for about 5 weeks and I will be in a boot for a few weeks.  Is this correct."

## 2016-09-25 NOTE — Telephone Encounter (Signed)
Patient was scheduled for surgery on December 5th.

## 2016-10-14 ENCOUNTER — Ambulatory Visit (INDEPENDENT_AMBULATORY_CARE_PROVIDER_SITE_OTHER): Payer: Medicare Other | Admitting: Podiatry

## 2016-10-14 ENCOUNTER — Encounter: Payer: Self-pay | Admitting: Podiatry

## 2016-10-14 DIAGNOSIS — M2042 Other hammer toe(s) (acquired), left foot: Secondary | ICD-10-CM | POA: Diagnosis not present

## 2016-10-14 NOTE — Progress Notes (Signed)
Subjective:     Patient ID: Roger Kent, male   DOB: 1946/06/05, 70 y.o.   MRN: [id number]  HPI patient states he wants to consider correction of his left second and third toes and comes with wife today but states he's been using padding which seems to improve the condition   Review of Systems     Objective:   Physical Exam Neurovascular status intact with malaligned second digit left with previous surgery on the left big toe joint and malalignment third toe distal left with keratotic lesion second toe which is pre-ulcerative in its nature    Assessment:     Hammertoe deformity second and third digit left with malalignment of the digit    Plan:     H&P condition reviewed and recommended at this time consideration of digital fusion second left with distal aspect digit 3 left. I explained the absolute no long-term guarantees that we can put this toe into an adequate position and ultimately may require further revisional surgery. Patient understands all this and at this time reviewed alternative treatments complications and signs consent form understanding recovery can take upwards of 6 months. Patient is going to hold off on surgery now until his schedule opens up and will call us when that's available and again we did review at great length the surgery itself

## 2016-10-14 NOTE — Patient Instructions (Signed)

## 2016-10-16 ENCOUNTER — Telehealth: Payer: Self-pay | Admitting: *Deleted

## 2016-10-16 NOTE — Telephone Encounter (Signed)
Called pt made appt for tomorrow morning @ 8:00...Roger Kent

## 2016-10-16 NOTE — Telephone Encounter (Signed)
OK OV tomorrow Thx

## 2016-10-16 NOTE — Telephone Encounter (Signed)
Rec'd call pt states he went to get his teeth cleaned, but they would not clean them due to his BP being high. He stated he check BP when he got home BP was 180/102. He states he feels fine not sure why bp is up. Wanting to be add-on on tomorrow to see Dr. Camila Li. Is this ok...Johny Chess

## 2016-10-17 ENCOUNTER — Other Ambulatory Visit (INDEPENDENT_AMBULATORY_CARE_PROVIDER_SITE_OTHER): Payer: Medicare Other

## 2016-10-17 ENCOUNTER — Ambulatory Visit (INDEPENDENT_AMBULATORY_CARE_PROVIDER_SITE_OTHER): Payer: Medicare Other | Admitting: Internal Medicine

## 2016-10-17 ENCOUNTER — Encounter: Payer: Self-pay | Admitting: Internal Medicine

## 2016-10-17 DIAGNOSIS — I10 Essential (primary) hypertension: Secondary | ICD-10-CM | POA: Diagnosis not present

## 2016-10-17 DIAGNOSIS — E785 Hyperlipidemia, unspecified: Secondary | ICD-10-CM

## 2016-10-17 LAB — BASIC METABOLIC PANEL
BUN: 20 mg/dL (ref 6–23)
CO2: 28 meq/L (ref 19–32)
Calcium: 9.7 mg/dL (ref 8.4–10.5)
Chloride: 105 mEq/L (ref 96–112)
Creatinine, Ser: 1.08 mg/dL (ref 0.40–1.50)
GFR: 71.7 mL/min (ref >60.00)
GLUCOSE: 97 mg/dL (ref 70–99)
POTASSIUM: 4.4 meq/L (ref 3.5–5.1)
SODIUM: 141 meq/L (ref 135–145)

## 2016-10-17 NOTE — Patient Instructions (Addendum)
Increase Olmesartan to 40 mg/day Stop using G2 drink Low salt diet

## 2016-10-17 NOTE — Assessment & Plan Note (Signed)
On diet F/u w/Dr Stanford Breed

## 2016-10-17 NOTE — Progress Notes (Signed)
Subjective:  Patient ID: Roger Kent, male    DOB: December 18, 1945  Age: 70 y.o. MRN: [id number]  CC: No chief complaint on file.   HPI Keyonn Kubick Fell presents for elevated BP with SBP >200 at the dentist's office. SBP 117 after gym. SBP 160 at home. Drinking G2 a lot.Marland KitchenMarland KitchenTaking Benicar 1/2 a day  Outpatient Medications Prior to Visit  Medication Sig Dispense Refill  . cetirizine (ZYRTEC) 10 MG tablet Take 10 mg by mouth as needed for allergies.     . cholecalciferol (VITAMIN D) 1000 UNITS tablet Take 1,000 Units by mouth every morning.    Marland Kitchen CRANBERRY PO Take 1 capsule by mouth daily.    . DiphenhydrAMINE HCl, Sleep, 25 MG TBDP Take 50 mg by mouth at bedtime.    Marland Kitchen doxycycline (VIBRA-TABS) 100 MG tablet Take 1 tablet (100 mg total) by mouth 2 (two) times daily. 14 tablet 0  . glucosamine-chondroitin 500-400 MG tablet Take 2 tablets by mouth every morning.     . montelukast (SINGULAIR) 10 MG tablet TAKE 1 TABLET BY MOUTH DAILY. 90 tablet 3  . Multiple Vitamin (MULTIVITAMIN WITH MINERALS) TABS tablet Take 1 tablet by mouth every morning.    . naproxen sodium (ANAPROX) 220 MG tablet Take 220 mg by mouth 2 (two) times daily.     Marland Kitchen olmesartan (BENICAR) 40 MG tablet TAKE 1 TABLET DAILY 90 tablet 1  . omeprazole-sodium bicarbonate (ZEGERID) 40-1100 MG per capsule Take 1 capsule by mouth daily before breakfast.    . OVER THE COUNTER MEDICATION Take 3 capsules by mouth every morning. OTC herbal supplement for prostate health    . OVER THE COUNTER MEDICATION Ultimate Bladder Support 1 each by mouth daily.     No facility-administered medications prior to visit.     ROS Review of Systems  Constitutional: Negative for appetite change, fatigue and unexpected weight change.  HENT: Negative for congestion, nosebleeds, sneezing, sore throat and trouble swallowing.   Eyes: Negative for itching and visual disturbance.  Respiratory: Negative for cough.   Cardiovascular: Negative for chest pain,  palpitations and leg swelling.  Gastrointestinal: Negative for abdominal distention, blood in stool, diarrhea and nausea.  Genitourinary: Negative for frequency and hematuria.  Musculoskeletal: Negative for back pain, gait problem, joint swelling and neck pain.  Skin: Negative for rash.  Neurological: Negative for dizziness, tremors, speech difficulty and weakness.  Psychiatric/Behavioral: Negative for agitation, dysphoric mood and sleep disturbance. The patient is not nervous/anxious.     Objective:  BP (!) 160/88   Pulse 79   Temp 98.5 F (36.9 C) (Oral)   Resp 20   Wt 185 lb (83.9 kg)   SpO2 98%   BMI 24.41 kg/m   BP Readings from Last 3 Encounters:  10/17/16 (!) 160/88  08/28/16 120/80  06/21/16 122/70    Wt Readings from Last 3 Encounters:  10/17/16 185 lb (83.9 kg)  08/28/16 187 lb (84.8 kg)  06/21/16 183 lb 12.8 oz (83.4 kg)    Physical Exam  Constitutional: He is oriented to person, place, and time. He appears well-developed. No distress.  NAD  HENT:  Mouth/Throat: Oropharynx is clear and moist.  Eyes: Conjunctivae are normal. Pupils are equal, round, and reactive to light.  Neck: Normal range of motion. No JVD present. No thyromegaly present.  Cardiovascular: Normal rate, regular rhythm, normal heart sounds and intact distal pulses.  Exam reveals no gallop and no friction rub.   No murmur heard. Pulmonary/Chest: Effort normal and  breath sounds normal. No respiratory distress. He has no wheezes. He has no rales. He exhibits no tenderness.  Abdominal: Soft. Bowel sounds are normal. He exhibits no distension and no mass. There is no tenderness. There is no rebound and no guarding.  Musculoskeletal: Normal range of motion. He exhibits no edema or tenderness.  Lymphadenopathy:    He has no cervical adenopathy.  Neurological: He is alert and oriented to person, place, and time. He has normal reflexes. No cranial nerve deficit. He exhibits normal muscle tone. He  displays a negative Romberg sign. Coordination and gait normal.  Skin: Skin is warm and dry. No rash noted.  Psychiatric: He has a normal mood and affect. His behavior is normal. Judgment and thought content normal.  abd - no abn pulsation  Lab Results  Component Value Date   WBC 9.8 12/22/2015   HGB 14.9 12/22/2015   HCT 45.1 12/22/2015   PLT 305.0 12/22/2015   GLUCOSE 97 06/21/2016   CHOL 148 12/22/2015   TRIG 96.0 12/22/2015   HDL 37.70 (L) 12/22/2015   LDLCALC 92 12/22/2015   ALT 14 11/22/2015   AST 17 11/22/2015   NA 140 06/21/2016   K 4.3 06/21/2016   CL 103 06/21/2016   CREATININE 1.09 06/21/2016   BUN 18 06/21/2016   CO2 31 06/21/2016   TSH 2.23 06/21/2016   PSA 8.08 (H) 12/22/2015   INR 2.4 RATIO (H) 09/17/2007   HGBA1C 5.6 12/22/2015    Dg Bone Density  Result Date: 06/25/2016 Date of study: 06/21/2016 Exam: DUAL X-RAY ABSORPTIOMETRY (DXA) FOR BONE MINERAL DENSITY (BMD) Instrument: [employer] Requesting Provider: PCP Indication: follow up for low BMD Comparison: 11/15/2010 Clinical data: Pt is a 70 y.o. male with previous h/o finger fracture. On calcium and vitamin D. Results:  Lumbar spine L1-L4(L3) Femoral neck (FN) T-score +0.5  RFN:-1.0 LFN:-1.5  Change in BMD from previous DXA test (%) +2.7%* -2.3%  (*) statistically significant Assessment: the BMD is low according to the [facility] classification for osteoporosis (see below). Fracture risk: moderate FRAX score: 10 year major osteoporotic risk: 10.6 %. 10 year hip fracture risk: 3.1 %. The thresholds for treatment are 20% and 3%, respectively. Comments: the technical quality of the study is good, however, the spine is scoliotic and arthritic. Calcium accumulation in arthritic sites can confound the results of the bone density scan. L3 vertebra had to be excluded from analysis due to degenerative changes. Evaluation for secondary causes should be considered if clinically indicated. Recommend optimizing calcium (1200  mg/day) and vitamin D (800 IU/day) intake. Followup: Repeat BMD is appropriate after 1-2 years WHO criteria for diagnosis of osteoporosis in postmenopausal women and in men 67 y/o or older: - normal: T-score -1.0 to + 1.0 - osteopenia/low bone density: T-score between -2.5 and -1.0 - osteoporosis: T-score below -2.5 - severe osteoporosis: T-score below -2.5 with history of fragility fracture Note: although not part of the WHO classification, the presence of a fragility fracture, regardless of the T-score, should be considered diagnostic of osteoporosis, provided other causes for the fracture have been excluded. Treatment: The National Osteoporosis Foundation recommends that treatment be considered in postmenopausal women and men age 58 or older with: 1. Hip or vertebral (clinical or morphometric) fracture 2. T-score of - 2.5 or lower at the spine or hip 3. 10-year fracture probability by FRAX of at least 20% for a major osteoporotic fracture and 3% for a hip fracture Philemon Kingdom, MD [facility] Endocrin[facility]  Assessment & Plan:   There are no diagnoses linked to this encounter. I am having Mr. Connon maintain his cetirizine, cholecalciferol, OVER THE COUNTER MEDICATION, glucosamine-chondroitin, multivitamin with minerals, omeprazole-sodium bicarbonate, naproxen sodium, DiphenhydrAMINE HCl (Sleep), CRANBERRY PO, OVER THE COUNTER MEDICATION, montelukast, doxycycline, and olmesartan.  No orders of the defined types were placed in this encounter.    Follow-up: No Follow-up on file.  Walker Kehr, MD

## 2016-10-17 NOTE — Progress Notes (Signed)
Pre visit review using our clinic review tool, if applicable. No additional management support is needed unless otherwise documented below in the visit note. 

## 2016-10-17 NOTE — Assessment & Plan Note (Addendum)
Worse Increase Olmesartan to 40 mg/day Stop using G2 Labs F/u w/Dr Stanford Breed

## 2016-10-22 LAB — ALDOSTERONE + RENIN ACTIVITY W/ RATIO: PRA LC/MS/MS: 2.55 ng/mL/h (ref 0.25–5.82)

## 2016-10-28 ENCOUNTER — Encounter: Payer: Self-pay | Admitting: Cardiology

## 2016-10-28 ENCOUNTER — Ambulatory Visit (INDEPENDENT_AMBULATORY_CARE_PROVIDER_SITE_OTHER): Payer: Medicare Other | Admitting: Cardiology

## 2016-10-28 VITALS — BP 118/82 | HR 70 | Ht 73.0 in | Wt 188.0 lb

## 2016-10-28 DIAGNOSIS — Q210 Ventricular septal defect: Secondary | ICD-10-CM

## 2016-10-28 DIAGNOSIS — Z87891 Personal history of nicotine dependence: Secondary | ICD-10-CM | POA: Diagnosis not present

## 2016-10-28 DIAGNOSIS — R0989 Other specified symptoms and signs involving the circulatory and respiratory systems: Secondary | ICD-10-CM

## 2016-10-28 DIAGNOSIS — I10 Essential (primary) hypertension: Secondary | ICD-10-CM

## 2016-10-28 MED ORDER — HYDROCHLOROTHIAZIDE 12.5 MG PO CAPS: 12.5000 mg | ORAL_CAPSULE | Freq: Every day | ORAL | 3 refills | Status: DC

## 2016-10-28 NOTE — Progress Notes (Signed)
HPI: FU congenital VSD, and prior DVT. Note a previous nuclear study performed in January of 2003 showed an ejection fraction of 50% and no ischemia or infarction. Echocardiogram August 2015 showed normal LV systolic function, grade 1 diastolic dysfunction, small perimembranous VSD, mild aortic insufficiency, mild left atrial enlargement, mild right ventricular enlargement and mild-moderate tricuspid regurgitation. Mildly elevated pulmonary pressures. Since I last saw him patient denies dyspnea, chest pain, palpitations, syncope.  Current Outpatient Prescriptions  Medication Sig Dispense Refill  . cholecalciferol (VITAMIN D) 1000 UNITS tablet Take 1,000 Units by mouth every morning.    Marland Kitchen CRANBERRY PO Take 1 capsule by mouth daily.    . DiphenhydrAMINE HCl, Sleep, 25 MG TBDP Take 50 mg by mouth 2 (two) times daily.     Marland Kitchen doxycycline (VIBRA-TABS) 100 MG tablet Take 1 tablet (100 mg total) by mouth 2 (two) times daily. 14 tablet 0  . glucosamine-chondroitin 500-400 MG tablet Take 2 tablets by mouth every morning.     . montelukast (SINGULAIR) 10 MG tablet TAKE 1 TABLET BY MOUTH DAILY. 90 tablet 3  . Multiple Vitamin (MULTIVITAMIN WITH MINERALS) TABS tablet Take 1 tablet by mouth every morning.    . olmesartan (BENICAR) 40 MG tablet TAKE 1 TABLET DAILY 90 tablet 1  . omeprazole-sodium bicarbonate (ZEGERID) 40-1100 MG per capsule Take 1 capsule by mouth daily before breakfast.    . OVER THE COUNTER MEDICATION Take 3 capsules by mouth every morning. OTC herbal supplement for prostate health    . OVER THE COUNTER MEDICATION Ultimate Bladder Support 1 each by mouth daily.     No current facility-administered medications for this visit.     Allergies  Allergen Reactions  . Oxycodone-Acetaminophen Other (See Comments)    Other Reaction: agitation  . Lamisil [Terbinafine Hcl] Rash     Past Medical History:  Diagnosis Date  . Acute hepatitis C without mention of hepatic coma(070.51)   .  Allergic rhinitis, cause unspecified   . Anxiety state, unspecified   . Barrett's esophagus   . BPH (benign prostatic hyperplasia)   . DVT (deep venous thrombosis) ([place])   . Elevated PSA    Dr Karsten Ro  . Esophageal reflux   . Hiatal hernia   . Hyperlipidemia   . Hypertension   . Hypertonicity of bladder   . Insomnia   . Osteoporosis, unspecified   . Personal history of urinary calculi   . Recent retinal detachment, partial, with giant tear   . Unspecified tinnitus   . Ventricular septal defect     Past Surgical History:  Procedure Laterality Date  . KNEE SURGERY    . RETINAL DETACHMENT SURGERY      Social History   Social History  . Marital status: Married    Spouse name: Amir Vonbergen  . Number of children: N/A  . Years of education: N/A   Occupational History  . 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 Smoking status: Former Research scientist (life sciences)  . Smokeless tobacco: Never Used  . Alcohol use No  . Drug use: No  . Sexual activity: Yes   Other Topics Concern  . Not on file   Social History Narrative                Family History  Problem Relation Age of Onset  . Hypertension Other   . Alcohol abuse Neg Hx     ROS:  no fevers or chills, productive cough, hemoptysis, dysphasia, odynophagia, melena, hematochezia, dysuria, hematuria, rash, seizure activity, orthopnea, PND, pedal edema, claudication. Remaining systems are negative.  Physical Exam:   Blood pressure 118/82, pulse 70, height 6\' 1"  (1.854 m), weight 188 lb (85.3 kg).  General:  Well developed/well nourished in NAD Skin warm/dry Patient not depressed No peripheral clubbing Back-normal HEENT-normal/normal eyelids Neck supple/normal carotid upstroke bilaterally; no bruits; no JVD; no thyromegaly chest - CTA/ normal expansion CV - RRR/normal S1 and S2; no rubs or gallops;  PMI nondisplaced; 2/6 systolic murmur left sternal border. Abdomen -NT/ND, no HSM, no  mass, + bowel sounds, no bruit 2+ femoral pulses, no bruits Ext-no edema, chords, 2+ DP Neuro-grossly nonfocal  ECG - sinus rhythm at a rate of 70. No ST changes.  A/P  1 perimembranous ventricular septal defect-plan repeat echocardiogram. Unless right ventricle and right atrium enlarged significantly we plan to follow this.  2 hyperlipidemia-management per primary care.  3 hypertension-blood pressure has been elevated at home. Add HCTZ 12.5 mg daily. Check potassium and renal function in 1 week. Follow blood pressure at home and adjust regimen as needed.  4-schedule abdominal ultrasound to exclude aneurysm.  Kirk Ruths, MD

## 2016-10-28 NOTE — Patient Instructions (Signed)
Medication Instructions:   START HCTZ 12.5 MG ONCE DAILY  Labwork:  Your physician recommends that you return FOR LAB WORK IN ONE WEEK  Testing/Procedures:  Your physician has requested that you have an echocardiogram. Echocardiography is a painless test that uses sound waves to create images of your heart. It provides your doctor with information about the size and shape of your heart and how well your heart's chambers and valves are working. This procedure takes approximately one hour. There are no restrictions for this procedure.   Your physician has requested that you have an abdominal aorta duplex. During this test, an ultrasound is used to evaluate the aorta. Allow 30 minutes for this exam. Do not eat after midnight the day before and avoid carbonated beverages   Follow-Up:  Your physician wants you to follow-up in: [place] will receive a reminder letter in the mail two months in advance. If you don't receive a letter, please call our office to schedule the follow-up appointment.   If you need a refill on your cardiac medications before your next appointment, please call your pharmacy.

## 2016-10-29 DIAGNOSIS — M25561 Pain in right knee: Secondary | ICD-10-CM | POA: Diagnosis not present

## 2016-10-29 DIAGNOSIS — M25562 Pain in left knee: Secondary | ICD-10-CM | POA: Diagnosis not present

## 2016-10-29 DIAGNOSIS — M170 Bilateral primary osteoarthritis of knee: Secondary | ICD-10-CM | POA: Diagnosis not present

## 2016-10-30 LAB — BASIC METABOLIC PANEL
BUN: 15 mg/dL (ref 7–25)
CO2: 25 mmol/L (ref 20–31)
Calcium: 9.3 mg/dL (ref 8.6–10.3)
Chloride: 100 mmol/L (ref 98–110)
Creat: 1.05 mg/dL (ref 0.70–1.18)
GLUCOSE: 93 mg/dL (ref 65–99)
POTASSIUM: 4.5 mmol/L (ref 3.5–5.3)
SODIUM: 135 mmol/L (ref 135–146)

## 2016-11-04 ENCOUNTER — Inpatient Hospital Stay (HOSPITAL_COMMUNITY): Admission: RE | Admit: 2016-11-04 | Payer: Medicare Other | Source: Ambulatory Visit

## 2016-11-04 DIAGNOSIS — M1712 Unilateral primary osteoarthritis, left knee: Secondary | ICD-10-CM | POA: Diagnosis not present

## 2016-11-04 DIAGNOSIS — M25562 Pain in left knee: Secondary | ICD-10-CM | POA: Diagnosis not present

## 2016-11-05 ENCOUNTER — Ambulatory Visit (INDEPENDENT_AMBULATORY_CARE_PROVIDER_SITE_OTHER): Payer: Medicare Other | Admitting: Internal Medicine

## 2016-11-05 ENCOUNTER — Encounter: Payer: Self-pay | Admitting: Internal Medicine

## 2016-11-05 DIAGNOSIS — D1801 Hemangioma of skin and subcutaneous tissue: Secondary | ICD-10-CM | POA: Diagnosis not present

## 2016-11-05 DIAGNOSIS — D485 Neoplasm of uncertain behavior of skin: Secondary | ICD-10-CM | POA: Diagnosis not present

## 2016-11-05 DIAGNOSIS — B079 Viral wart, unspecified: Secondary | ICD-10-CM | POA: Insufficient documentation

## 2016-11-05 DIAGNOSIS — L570 Actinic keratosis: Secondary | ICD-10-CM | POA: Diagnosis not present

## 2016-11-05 DIAGNOSIS — D225 Melanocytic nevi of trunk: Secondary | ICD-10-CM | POA: Diagnosis not present

## 2016-11-05 DIAGNOSIS — L814 Other melanin hyperpigmentation: Secondary | ICD-10-CM | POA: Diagnosis not present

## 2016-11-05 DIAGNOSIS — Z85828 Personal history of other malignant neoplasm of skin: Secondary | ICD-10-CM | POA: Diagnosis not present

## 2016-11-05 DIAGNOSIS — I10 Essential (primary) hypertension: Secondary | ICD-10-CM | POA: Diagnosis not present

## 2016-11-05 DIAGNOSIS — L821 Other seborrheic keratosis: Secondary | ICD-10-CM | POA: Diagnosis not present

## 2016-11-05 NOTE — Assessment & Plan Note (Signed)
See Cryo 

## 2016-11-05 NOTE — Progress Notes (Signed)
Pre visit review using our clinic review tool, if applicable. No additional management support is needed unless otherwise documented below in the visit note. 

## 2016-11-05 NOTE — Patient Instructions (Signed)
Postprocedure instructions :     Keep the wounds clean. You can wash them with liquid soap and water. Pat dry with gauze or a Kleenex tissue  Before applying antibiotic ointment and a Band-Aid.   You need to report immediately  if  any signs of infection develop.    TRE - [place] Release exercise

## 2016-11-05 NOTE — Progress Notes (Signed)
Subjective:  Patient ID: Roger Kent, male    DOB: 11-Nov-1946  Age: 70 y.o. MRN: [id number]  CC: No chief complaint on file.   HPI Roger Kent presents for HTN f/u. F/u B OA - on Hyalgan injections. F/u BPH  R index 2 warts  Outpatient Medications Prior to Visit  Medication Sig Dispense Refill  . cholecalciferol (VITAMIN D) 1000 UNITS tablet Take 1,000 Units by mouth every morning.    Marland Kitchen CRANBERRY PO Take 1 capsule by mouth daily.    . DiphenhydrAMINE HCl, Sleep, 25 MG TBDP Take 50 mg by mouth 2 (two) times daily.     Marland Kitchen glucosamine-chondroitin 500-400 MG tablet Take 2 tablets by mouth every morning.     . hydrochlorothiazide (MICROZIDE) 12.5 MG capsule Take 1 capsule (12.5 mg total) by mouth daily. 90 capsule 3  . montelukast (SINGULAIR) 10 MG tablet TAKE 1 TABLET BY MOUTH DAILY. (Patient taking differently: TAKE 1 TABLET BY MOUTH DAILY AS NEEDED.) 90 tablet 3  . Multiple Vitamin (MULTIVITAMIN WITH MINERALS) TABS tablet Take 1 tablet by mouth every morning.    . olmesartan (BENICAR) 40 MG tablet TAKE 1 TABLET DAILY 90 tablet 1  . omeprazole-sodium bicarbonate (ZEGERID) 40-1100 MG per capsule Take 1 capsule by mouth daily before breakfast.    . OVER THE COUNTER MEDICATION Take 3 capsules by mouth every morning. OTC herbal supplement for prostate health    . OVER THE COUNTER MEDICATION Ultimate Bladder Support 1 each by mouth daily.    Marland Kitchen doxycycline (VIBRA-TABS) 100 MG tablet Take 1 tablet (100 mg total) by mouth 2 (two) times daily. (Patient not taking: Reported on 11/05/2016) 14 tablet 0   No facility-administered medications prior to visit.     ROS Review of Systems  Constitutional: Negative for appetite change, fatigue and unexpected weight change.  HENT: Negative for congestion, nosebleeds, sneezing, sore throat and trouble swallowing.   Eyes: Negative for itching and visual disturbance.  Respiratory: Negative for cough.   Cardiovascular: Negative for chest pain,  palpitations and leg swelling.  Gastrointestinal: Negative for abdominal distention, blood in stool, diarrhea and nausea.  Genitourinary: Negative for frequency and hematuria.  Musculoskeletal: Positive for arthralgias. Negative for back pain, gait problem, joint swelling and neck pain.  Skin: Negative for rash.  Neurological: Negative for dizziness, tremors, speech difficulty and weakness.  Psychiatric/Behavioral: Negative for agitation, dysphoric mood, sleep disturbance and suicidal ideas. The patient is not nervous/anxious.     Objective:  BP 132/82   Pulse 77   Wt 188 lb (85.3 kg)   SpO2 95%   BMI 24.80 kg/m   BP Readings from Last 3 Encounters:  11/05/16 132/82  10/28/16 118/82  10/17/16 (!) 160/88    Wt Readings from Last 3 Encounters:  11/05/16 188 lb (85.3 kg)  10/28/16 188 lb (85.3 kg)  10/17/16 185 lb (83.9 kg)    Physical Exam  Constitutional: He is oriented to person, place, and time. He appears well-developed. No distress.  NAD  HENT:  Mouth/Throat: Oropharynx is clear and moist.  Eyes: Conjunctivae are normal. Pupils are equal, round, and reactive to light.  Neck: Normal range of motion. No JVD present. No thyromegaly present.  Cardiovascular: Normal rate, regular rhythm, normal heart sounds and intact distal pulses.  Exam reveals no gallop and no friction rub.   No murmur heard. Pulmonary/Chest: Effort normal and breath sounds normal. No respiratory distress. He has no wheezes. He has no rales. He exhibits no tenderness.  Abdominal: Soft. Bowel sounds are normal. He exhibits no distension and no mass. There is no tenderness. There is no rebound and no guarding.  Musculoskeletal: Normal range of motion. He exhibits tenderness. He exhibits no edema.  Lymphadenopathy:    He has no cervical adenopathy.  Neurological: He is alert and oriented to person, place, and time. He has normal reflexes. No cranial nerve deficit. He exhibits normal muscle tone. He displays a  negative Romberg sign. Coordination and gait normal.  Skin: Skin is warm and dry. No rash noted.  Psychiatric: He has a normal mood and affect. His behavior is normal. Judgment and thought content normal.  warts x2    Procedure Note :     Procedure : Cryosurgery   Indication:  Wart(s) x2 on R index finger   Risks including unsuccessful procedure , bleeding, infection, bruising, scar, a need for a repeat  procedure and others were explained to the patient in detail as well as the benefits. Informed consent was obtained verbally.     2 lesion(s)  on  R index  was/were treated with liquid nitrogen on a Q-tip in a usual fasion . Band-Aid was applied and antibiotic ointment was given for a later use.   Tolerated well. Complications none.      Lab Results  Component Value Date   WBC 9.8 12/22/2015   HGB 14.9 12/22/2015   HCT 45.1 12/22/2015   PLT 305.0 12/22/2015   GLUCOSE 93 10/28/2016   CHOL 148 12/22/2015   TRIG 96.0 12/22/2015   HDL 37.70 (L) 12/22/2015   LDLCALC 92 12/22/2015   ALT 14 11/22/2015   AST 17 11/22/2015   NA 135 10/28/2016   K 4.5 10/28/2016   CL 100 10/28/2016   CREATININE 1.05 10/28/2016   BUN 15 10/28/2016   CO2 25 10/28/2016   TSH 2.23 06/21/2016   PSA 8.08 (H) 12/22/2015   INR 2.4 RATIO (H) 09/17/2007   HGBA1C 5.6 12/22/2015    Dg Bone Density  Result Date: 06/25/2016 Date of study: 06/21/2016 Exam: DUAL X-RAY ABSORPTIOMETRY (DXA) FOR BONE MINERAL DENSITY (BMD) Instrument: [employer] Requesting Provider: PCP Indication: follow up for low BMD Comparison: 11/15/2010 Clinical data: Pt is a 70 y.o. male with previous h/o finger fracture. On calcium and vitamin D. Results:  Lumbar spine L1-L4(L3) Femoral neck (FN) T-score +0.5  RFN:-1.0 LFN:-1.5  Change in BMD from previous DXA test (%) +2.7%* -2.3%  (*) statistically significant Assessment: the BMD is low according to the [facility] classification for osteoporosis (see below). Fracture risk: moderate FRAX  score: 10 year major osteoporotic risk: 10.6 %. 10 year hip fracture risk: 3.1 %. The thresholds for treatment are 20% and 3%, respectively. Comments: the technical quality of the study is good, however, the spine is scoliotic and arthritic. Calcium accumulation in arthritic sites can confound the results of the bone density scan. L3 vertebra had to be excluded from analysis due to degenerative changes. Evaluation for secondary causes should be considered if clinically indicated. Recommend optimizing calcium (1200 mg/day) and vitamin D (800 IU/day) intake. Followup: Repeat BMD is appropriate after 1-2 years WHO criteria for diagnosis of osteoporosis in postmenopausal women and in men 77 y/o or older: - normal: T-score -1.0 to + 1.0 - osteopenia/low bone density: T-score between -2.5 and -1.0 - osteoporosis: T-score below -2.5 - severe osteoporosis: T-score below -2.5 with history of fragility fracture Note: although not part of the WHO classification, the presence of a fragility fracture, regardless of  the T-score, should be considered diagnostic of osteoporosis, provided other causes for the fracture have been excluded. Treatment: The National Osteoporosis Foundation recommends that treatment be considered in postmenopausal women and men age 61 or older with: 1. Hip or vertebral (clinical or morphometric) fracture 2. T-score of - 2.5 or lower at the spine or hip 3. 10-year fracture probability by FRAX of at least 20% for a major osteoporotic fracture and 3% for a hip fracture Philemon Kingdom, MD [facility]    Assessment & Plan:   There are no diagnoses linked to this encounter. I have discontinued Mr. Creson's doxycycline. I am also having him maintain his cholecalciferol, OVER THE COUNTER MEDICATION, glucosamine-chondroitin, multivitamin with minerals, omeprazole-sodium bicarbonate, DiphenhydrAMINE HCl (Sleep), CRANBERRY PO, OVER THE COUNTER MEDICATION, montelukast, olmesartan,  hydrochlorothiazide, Homeopathic Products (STRESS/EXHAUSTION RELIEF SL), and CAFFEINE PO.  Meds ordered this encounter  Medications  . Homeopathic Products (STRESS/EXHAUSTION RELIEF SL)    Sig: Take 2 capsules by mouth at bedtime.  Marland Kitchen CAFFEINE PO    Sig: Take 2 capsules by mouth 3 times/day as needed-between meals & bedtime.     Follow-up: No Follow-up on file.  Walker Kehr, MD

## 2016-11-05 NOTE — Assessment & Plan Note (Signed)
Benicar, HCT 

## 2016-11-13 DIAGNOSIS — M1711 Unilateral primary osteoarthritis, right knee: Secondary | ICD-10-CM | POA: Diagnosis not present

## 2016-11-13 DIAGNOSIS — M25561 Pain in right knee: Secondary | ICD-10-CM | POA: Diagnosis not present

## 2016-11-19 ENCOUNTER — Other Ambulatory Visit: Payer: Self-pay

## 2016-11-19 ENCOUNTER — Ambulatory Visit (HOSPITAL_COMMUNITY): Payer: Medicare Other | Attending: Cardiovascular Disease

## 2016-11-19 DIAGNOSIS — Q210 Ventricular septal defect: Secondary | ICD-10-CM | POA: Insufficient documentation

## 2016-11-19 DIAGNOSIS — I351 Nonrheumatic aortic (valve) insufficiency: Secondary | ICD-10-CM | POA: Insufficient documentation

## 2016-11-19 DIAGNOSIS — R9439 Abnormal result of other cardiovascular function study: Secondary | ICD-10-CM | POA: Insufficient documentation

## 2016-11-19 LAB — ECHOCARDIOGRAM COMPLETE
AOASC: 33 cm
CHL CUP DOP CALC LVOT VTI: 21.1 cm
CHL CUP MV DEC (S): 264
CHL CUP RV SYS PRESS: 47 mmHg
E/e' ratio: 4.04
EWDT: 264 ms
FS: 42 % (ref 28–44)
IVS/LV PW RATIO, ED: 1.03
LA ID, A-P, ES: 42 mm
LA diam index: 2 cm/m2
LA vol A4C: 68.9 ml
LA vol index: 35.8 mL/m2
LAVOL: 75.3 mL
LEFT ATRIUM END SYS DIAM: 42 mm
LV E/e' medial: 4.04
LV E/e'average: 4.04
LV e' LATERAL: 10 cm/s
LVOT area: 4.15 cm2
LVOT peak vel: 96.7 cm/s
LVOTD: 23 mm
LVOTSV: 88 mL
MV pk E vel: 40.4 m/s
MVPKAVEL: 56.3 m/s
P 1/2 time: 473 ms
PV Reg grad dias: 8 mmHg
PV Reg vel dias: 144 cm/s
PW: 11.7 mm — AB (ref 0.6–1.1)
Reg peak vel: 314 cm/s
TDI e' lateral: 10
TDI e' medial: 5.55
TR max vel: 314 cm/s

## 2016-11-20 ENCOUNTER — Ambulatory Visit: Payer: Medicare Other | Admitting: Podiatry

## 2016-11-20 DIAGNOSIS — M1712 Unilateral primary osteoarthritis, left knee: Secondary | ICD-10-CM | POA: Diagnosis not present

## 2016-11-20 DIAGNOSIS — M25562 Pain in left knee: Secondary | ICD-10-CM | POA: Diagnosis not present

## 2016-11-25 DIAGNOSIS — M1711 Unilateral primary osteoarthritis, right knee: Secondary | ICD-10-CM | POA: Diagnosis not present

## 2016-11-25 DIAGNOSIS — M25561 Pain in right knee: Secondary | ICD-10-CM | POA: Diagnosis not present

## 2016-11-29 ENCOUNTER — Ambulatory Visit (HOSPITAL_COMMUNITY)
Admission: RE | Admit: 2016-11-29 | Discharge: 2016-11-29 | Disposition: A | Discharge status: Home / Self Care | Payer: Medicare Other | Source: Ambulatory Visit | Attending: Internal Medicine | Admitting: Internal Medicine

## 2016-11-29 DIAGNOSIS — I723 Aneurysm of iliac artery: Secondary | ICD-10-CM | POA: Diagnosis not present

## 2016-11-29 DIAGNOSIS — I700 Atherosclerosis of aorta: Secondary | ICD-10-CM | POA: Insufficient documentation

## 2016-11-29 DIAGNOSIS — R0989 Other specified symptoms and signs involving the circulatory and respiratory systems: Secondary | ICD-10-CM | POA: Diagnosis not present

## 2016-11-29 DIAGNOSIS — Z87891 Personal history of nicotine dependence: Secondary | ICD-10-CM | POA: Diagnosis not present

## 2016-12-02 DIAGNOSIS — M25562 Pain in left knee: Secondary | ICD-10-CM | POA: Diagnosis not present

## 2016-12-02 DIAGNOSIS — M1712 Unilateral primary osteoarthritis, left knee: Secondary | ICD-10-CM | POA: Diagnosis not present

## 2016-12-09 DIAGNOSIS — M25561 Pain in right knee: Secondary | ICD-10-CM | POA: Diagnosis not present

## 2016-12-09 DIAGNOSIS — M1711 Unilateral primary osteoarthritis, right knee: Secondary | ICD-10-CM | POA: Diagnosis not present

## 2016-12-16 ENCOUNTER — Ambulatory Visit (INDEPENDENT_AMBULATORY_CARE_PROVIDER_SITE_OTHER): Payer: Medicare Other | Admitting: Ophthalmology

## 2016-12-16 DIAGNOSIS — I10 Essential (primary) hypertension: Secondary | ICD-10-CM | POA: Diagnosis not present

## 2016-12-16 DIAGNOSIS — M170 Bilateral primary osteoarthritis of knee: Secondary | ICD-10-CM | POA: Diagnosis not present

## 2016-12-16 DIAGNOSIS — H43813 Vitreous degeneration, bilateral: Secondary | ICD-10-CM | POA: Diagnosis not present

## 2016-12-16 DIAGNOSIS — M25562 Pain in left knee: Secondary | ICD-10-CM | POA: Diagnosis not present

## 2016-12-16 DIAGNOSIS — H35033 Hypertensive retinopathy, bilateral: Secondary | ICD-10-CM | POA: Diagnosis not present

## 2016-12-16 DIAGNOSIS — H59031 Cystoid macular edema following cataract surgery, right eye: Secondary | ICD-10-CM

## 2016-12-16 DIAGNOSIS — M25561 Pain in right knee: Secondary | ICD-10-CM | POA: Diagnosis not present

## 2016-12-16 DIAGNOSIS — H338 Other retinal detachments: Secondary | ICD-10-CM | POA: Diagnosis not present

## 2016-12-26 ENCOUNTER — Encounter: Payer: Self-pay | Admitting: Internal Medicine

## 2016-12-26 ENCOUNTER — Other Ambulatory Visit (INDEPENDENT_AMBULATORY_CARE_PROVIDER_SITE_OTHER): Payer: Medicare Other

## 2016-12-26 ENCOUNTER — Ambulatory Visit (INDEPENDENT_AMBULATORY_CARE_PROVIDER_SITE_OTHER): Payer: Medicare Other | Admitting: Internal Medicine

## 2016-12-26 ENCOUNTER — Ambulatory Visit (INDEPENDENT_AMBULATORY_CARE_PROVIDER_SITE_OTHER)
Admission: RE | Admit: 2016-12-26 | Discharge: 2016-12-26 | Disposition: A | Discharge status: Home / Self Care | Payer: Medicare Other | Source: Ambulatory Visit | Attending: Internal Medicine | Admitting: Internal Medicine

## 2016-12-26 VITALS — BP 136/74 | HR 69 | Temp 98.4°F | Resp 20 | Wt 187.8 lb

## 2016-12-26 DIAGNOSIS — R972 Elevated prostate specific antigen [PSA]: Secondary | ICD-10-CM | POA: Diagnosis not present

## 2016-12-26 DIAGNOSIS — E785 Hyperlipidemia, unspecified: Secondary | ICD-10-CM

## 2016-12-26 DIAGNOSIS — K2270 Barrett's esophagus without dysplasia: Secondary | ICD-10-CM

## 2016-12-26 DIAGNOSIS — R05 Cough: Secondary | ICD-10-CM

## 2016-12-26 DIAGNOSIS — R059 Cough, unspecified: Secondary | ICD-10-CM

## 2016-12-26 DIAGNOSIS — Z0000 Encounter for general adult medical examination without abnormal findings: Secondary | ICD-10-CM

## 2016-12-26 LAB — BASIC METABOLIC PANEL
BUN: 16 mg/dL (ref 6–23)
CO2: 30 meq/L (ref 19–32)
Calcium: 9.7 mg/dL (ref 8.4–10.5)
Chloride: 103 mEq/L (ref 96–112)
Creatinine, Ser: 1.16 mg/dL (ref 0.40–1.50)
GFR: 65.99 mL/min (ref >60.00)
GLUCOSE: 96 mg/dL (ref 70–99)
Potassium: 4.3 mEq/L (ref 3.5–5.1)
Sodium: 138 mEq/L (ref 135–145)

## 2016-12-26 LAB — HEPATIC FUNCTION PANEL
ALBUMIN: 4.7 g/dL (ref 3.5–5.2)
ALT: 18 U/L (ref 0–53)
AST: 20 U/L (ref 0–37)
Alkaline Phosphatase: 51 U/L (ref 39–117)
Bilirubin, Direct: 0.2 mg/dL (ref 0.0–0.3)
TOTAL PROTEIN: 7.7 g/dL (ref 6.0–8.3)
Total Bilirubin: 0.8 mg/dL (ref 0.2–1.2)

## 2016-12-26 LAB — LIPID PANEL
CHOL/HDL RATIO: 3
CHOLESTEROL: 158 mg/dL (ref 0–200)
HDL: 47.8 mg/dL (ref >39.00)
LDL CALC: 95 mg/dL (ref 0–99)
NONHDL: 110.36
Triglycerides: 79 mg/dL (ref 0.0–149.0)
VLDL: 15.8 mg/dL (ref 0.0–40.0)

## 2016-12-26 LAB — CBC WITH DIFFERENTIAL/PLATELET
BASOS PCT: 0.7 % (ref 0.0–3.0)
Basophils Absolute: 0.1 10*3/uL (ref 0.0–0.1)
EOS PCT: 2.3 % (ref 0.0–5.0)
Eosinophils Absolute: 0.2 10*3/uL (ref 0.0–0.7)
HEMATOCRIT: 45.2 % (ref 39.0–52.0)
HEMOGLOBIN: 15.3 g/dL (ref 13.0–17.0)
LYMPHS PCT: 28 % (ref 12.0–46.0)
Lymphs Abs: 2.5 10*3/uL (ref 0.7–4.0)
MCHC: 33.8 g/dL (ref 30.0–36.0)
MCV: 92 fl (ref 78.0–100.0)
Monocytes Absolute: 0.8 10*3/uL (ref 0.1–1.0)
Monocytes Relative: 9.3 % (ref 3.0–12.0)
Neutro Abs: 5.3 10*3/uL (ref 1.4–7.7)
Neutrophils Relative %: 59.7 % (ref 43.0–77.0)
Platelets: 238 10*3/uL (ref 150.0–400.0)
RBC: 4.91 Mil/uL (ref 4.22–5.81)
RDW: 14.6 % (ref 11.5–15.5)
WBC: 8.8 10*3/uL (ref 4.0–10.5)

## 2016-12-26 LAB — URINALYSIS
BILIRUBIN URINE: NEGATIVE
Hgb urine dipstick: NEGATIVE
KETONES UR: NEGATIVE
LEUKOCYTES UA: NEGATIVE
Nitrite: NEGATIVE
PH: 6.5 (ref 5.0–8.0)
Specific Gravity, Urine: <=1.005 — AB (ref 1.000–1.030)
TOTAL PROTEIN, URINE-UPE24: NEGATIVE
URINE GLUCOSE: NEGATIVE
Urobilinogen, UA: 0.2 (ref 0.0–1.0)

## 2016-12-26 LAB — TSH: TSH: 2.69 u[IU]/mL (ref 0.35–4.50)

## 2016-12-26 LAB — PSA: PSA: 8.38 ng/mL — ABNORMAL HIGH (ref 0.10–4.00)

## 2016-12-26 NOTE — Assessment & Plan Note (Signed)
Labs

## 2016-12-26 NOTE — Progress Notes (Signed)
Pre visit review using our clinic review tool, if applicable. No additional management support is needed unless otherwise documented below in the visit note. 

## 2016-12-26 NOTE — Assessment & Plan Note (Signed)
Here for medicare wellness/physical  Diet: heart healthy  Physical activity: not sedentary - very ative Depression/mood screen: negative  Hearing: decreased to whispered voice  Visual acuity: grossly normal, performs frequent eye exams due to MD - Dr Zigmund Daniel  ADLs: capable  Fall risk: low to none  Home safety: good  Cognitive evaluation: intact to orientation, naming, recall and repetition  EOL planning: adv directives, full code/ I agree  I have personally reviewed and have noted  1. The patient's medical, surgical and social history  2. Their use of alcohol, tobacco or illicit drugs  3. Their current medications and supplements  4. The patient's functional ability including ADL's, fall risks, home safety risks and hearing or visual impairment.  5. Diet and physical activities  6. Evidence for depression or mood disorders 7. The roster of all physicians providing medical care to patient - is listed in the Snapshot section of the chart and reviewed today.    Today patient counseled on age appropriate routine health concerns for screening and prevention, each reviewed and up to date or declined. Immunizations reviewed and up to date or declined. Labs ordered and reviewed. Risk factors for depression reviewed and negative. Hearing function and visual acuity are intact. ADLs screened and addressed as needed. Functional ability and level of safety reviewed and appropriate. Education, counseling and referrals performed based on assessed risks today. Patient provided with a copy of personalized plan for preventive services.   Dr Earlean Shawl: colon is due in 2024

## 2016-12-26 NOTE — Assessment & Plan Note (Signed)
F/u w/Dr Ottelin 

## 2016-12-26 NOTE — Patient Instructions (Signed)

## 2016-12-26 NOTE — Progress Notes (Signed)
Subjective:  Patient ID: Roger Kent, male    DOB: 1946-02-24  Age: 71 y.o. MRN: [id number]  CC: No chief complaint on file.   HPI Roger Kent presents for a well exam.  Pt was diagnosed w/MD. He went to [facility] for knee injections  Outpatient Medications Prior to Visit  Medication Sig Dispense Refill  . CAFFEINE PO Take 2 capsules by mouth 3 times/day as needed-between meals & bedtime.    . cholecalciferol (VITAMIN D) 1000 UNITS tablet Take 1,000 Units by mouth every morning.    . DiphenhydrAMINE HCl, Sleep, 25 MG TBDP Take 50 mg by mouth 2 (two) times daily.     Marland Kitchen glucosamine-chondroitin 500-400 MG tablet Take 2 tablets by mouth every morning.     . Homeopathic Products (STRESS/EXHAUSTION RELIEF SL) Take 2 capsules by mouth at bedtime.    . hydrochlorothiazide (MICROZIDE) 12.5 MG capsule Take 1 capsule (12.5 mg total) by mouth daily. 90 capsule 3  . montelukast (SINGULAIR) 10 MG tablet TAKE 1 TABLET BY MOUTH DAILY. (Patient taking differently: TAKE 1 TABLET BY MOUTH DAILY AS NEEDED.) 90 tablet 3  . Multiple Vitamin (MULTIVITAMIN WITH MINERALS) TABS tablet Take 1 tablet by mouth every morning.    . olmesartan (BENICAR) 40 MG tablet TAKE 1 TABLET DAILY 90 tablet 1  . omeprazole-sodium bicarbonate (ZEGERID) 40-1100 MG per capsule Take 1 capsule by mouth daily before breakfast.    . OVER THE COUNTER MEDICATION Take 3 capsules by mouth every morning. OTC herbal supplement for prostate health    . OVER THE COUNTER MEDICATION Ultimate Bladder Support 1 each by mouth daily.    Marland Kitchen CRANBERRY PO Take 1 capsule by mouth daily.     No facility-administered medications prior to visit.     ROS Review of Systems  Constitutional: Negative for appetite change, fatigue and unexpected weight change.  HENT: Positive for postnasal drip. Negative for congestion, nosebleeds, sneezing, sore throat and trouble swallowing.   Eyes: Negative for itching and visual disturbance.    Respiratory: Negative for cough.   Cardiovascular: Negative for chest pain, palpitations and leg swelling.  Gastrointestinal: Negative for abdominal distention, blood in stool, diarrhea and nausea.  Genitourinary: Negative for frequency and hematuria.  Musculoskeletal: Positive for arthralgias. Negative for back pain, gait problem, joint swelling and neck pain.  Skin: Negative for rash.  Neurological: Negative for dizziness, tremors, speech difficulty and weakness.  Psychiatric/Behavioral: Negative for agitation, dysphoric mood, sleep disturbance and suicidal ideas. The patient is not nervous/anxious.   occ cough w/allergies  Objective:  BP 136/74   Pulse 69   Temp 98.4 F (36.9 C) (Oral)   Resp 20   Wt 187 lb 12 oz (85.2 kg)   SpO2 94%   BMI 24.77 kg/m   BP Readings from Last 3 Encounters:  12/26/16 136/74  11/05/16 132/82  10/28/16 118/82    Wt Readings from Last 3 Encounters:  12/26/16 187 lb 12 oz (85.2 kg)  11/05/16 188 lb (85.3 kg)  10/28/16 188 lb (85.3 kg)    Physical Exam  Constitutional: He is oriented to person, place, and time. He appears well-developed. No distress.  NAD  HENT:  Mouth/Throat: Oropharynx is clear and moist.  Eyes: Conjunctivae are normal. Pupils are equal, round, and reactive to light.  Neck: Normal range of motion. No JVD present. No thyromegaly present.  Cardiovascular: Normal rate, regular rhythm, normal heart sounds and intact distal pulses.  Exam reveals no gallop and no friction rub.  No murmur heard. Pulmonary/Chest: Effort normal and breath sounds normal. No respiratory distress. He has no wheezes. He has no rales. He exhibits no tenderness.  Abdominal: Soft. Bowel sounds are normal. He exhibits no distension and no mass. There is no tenderness. There is no rebound and no guarding.  Musculoskeletal: Normal range of motion. He exhibits tenderness. He exhibits no edema.  Lymphadenopathy:    He has no cervical adenopathy.   Neurological: He is alert and oriented to person, place, and time. He has normal reflexes. No cranial nerve deficit. He exhibits normal muscle tone. He displays a negative Romberg sign. Coordination and gait normal.  Skin: Skin is warm and dry. No rash noted.  Psychiatric: He has a normal mood and affect. His behavior is normal. Judgment and thought content normal.  Rectal per Dr Karsten Ro   Lab Results  Component Value Date   WBC 9.8 12/22/2015   HGB 14.9 12/22/2015   HCT 45.1 12/22/2015   PLT 305.0 12/22/2015   GLUCOSE 93 10/28/2016   CHOL 148 12/22/2015   TRIG 96.0 12/22/2015   HDL 37.70 (L) 12/22/2015   LDLCALC 92 12/22/2015   ALT 14 11/22/2015   AST 17 11/22/2015   NA 135 10/28/2016   K 4.5 10/28/2016   CL 100 10/28/2016   CREATININE 1.05 10/28/2016   BUN 15 10/28/2016   CO2 25 10/28/2016   TSH 2.23 06/21/2016   PSA 8.08 (H) 12/22/2015   INR 2.4 RATIO (H) 09/17/2007   HGBA1C 5.6 12/22/2015    No results found.  Assessment & Plan:   There are no diagnoses linked to this encounter. I am having [name] maintain his cholecalciferol, OVER THE COUNTER MEDICATION, glucosamine-chondroitin, multivitamin with minerals, omeprazole-sodium bicarbonate, DiphenhydrAMINE HCl (Sleep), CRANBERRY PO, OVER THE COUNTER MEDICATION, montelukast, olmesartan, hydrochlorothiazide, Homeopathic Products (STRESS/EXHAUSTION RELIEF SL), and CAFFEINE PO.  No orders of the defined types were placed in this encounter.    Follow-up: No Follow-up on file.  Walker Kehr, MD

## 2016-12-26 NOTE — Assessment & Plan Note (Signed)
Zegerid 

## 2017-01-15 ENCOUNTER — Ambulatory Visit (INDEPENDENT_AMBULATORY_CARE_PROVIDER_SITE_OTHER): Payer: Medicare Other | Admitting: Ophthalmology

## 2017-01-22 DIAGNOSIS — D485 Neoplasm of uncertain behavior of skin: Secondary | ICD-10-CM | POA: Diagnosis not present

## 2017-01-22 DIAGNOSIS — L900 Lichen sclerosus et atrophicus: Secondary | ICD-10-CM | POA: Diagnosis not present

## 2017-01-27 ENCOUNTER — Encounter (INDEPENDENT_AMBULATORY_CARE_PROVIDER_SITE_OTHER): Payer: Medicare Other | Admitting: Ophthalmology

## 2017-01-27 DIAGNOSIS — H59031 Cystoid macular edema following cataract surgery, right eye: Secondary | ICD-10-CM

## 2017-01-27 DIAGNOSIS — H338 Other retinal detachments: Secondary | ICD-10-CM | POA: Diagnosis not present

## 2017-01-27 DIAGNOSIS — I10 Essential (primary) hypertension: Secondary | ICD-10-CM

## 2017-01-27 DIAGNOSIS — H35033 Hypertensive retinopathy, bilateral: Secondary | ICD-10-CM

## 2017-01-27 DIAGNOSIS — H43813 Vitreous degeneration, bilateral: Secondary | ICD-10-CM | POA: Diagnosis not present

## 2017-01-30 ENCOUNTER — Encounter: Payer: Self-pay | Admitting: Internal Medicine

## 2017-01-30 NOTE — Progress Notes (Signed)
[facility]

## 2017-03-03 ENCOUNTER — Other Ambulatory Visit: Payer: Self-pay | Admitting: Internal Medicine

## 2017-03-21 ENCOUNTER — Other Ambulatory Visit: Payer: Self-pay | Admitting: Internal Medicine

## 2017-03-24 DIAGNOSIS — M25562 Pain in left knee: Secondary | ICD-10-CM | POA: Diagnosis not present

## 2017-03-24 DIAGNOSIS — Z789 Other specified health status: Secondary | ICD-10-CM | POA: Diagnosis not present

## 2017-03-24 DIAGNOSIS — M25561 Pain in right knee: Secondary | ICD-10-CM | POA: Diagnosis not present

## 2017-03-24 DIAGNOSIS — M170 Bilateral primary osteoarthritis of knee: Secondary | ICD-10-CM | POA: Diagnosis not present

## 2017-03-31 DIAGNOSIS — M1712 Unilateral primary osteoarthritis, left knee: Secondary | ICD-10-CM | POA: Diagnosis not present

## 2017-04-07 DIAGNOSIS — M25561 Pain in right knee: Secondary | ICD-10-CM | POA: Diagnosis not present

## 2017-04-07 DIAGNOSIS — M1711 Unilateral primary osteoarthritis, right knee: Secondary | ICD-10-CM | POA: Diagnosis not present

## 2017-04-16 DIAGNOSIS — M25561 Pain in right knee: Secondary | ICD-10-CM | POA: Diagnosis not present

## 2017-04-16 DIAGNOSIS — M170 Bilateral primary osteoarthritis of knee: Secondary | ICD-10-CM | POA: Diagnosis not present

## 2017-04-16 DIAGNOSIS — M25562 Pain in left knee: Secondary | ICD-10-CM | POA: Diagnosis not present

## 2017-04-29 ENCOUNTER — Encounter (INDEPENDENT_AMBULATORY_CARE_PROVIDER_SITE_OTHER): Payer: Medicare Other | Admitting: Ophthalmology

## 2017-04-29 DIAGNOSIS — H59031 Cystoid macular edema following cataract surgery, right eye: Secondary | ICD-10-CM

## 2017-04-29 DIAGNOSIS — H35372 Puckering of macula, left eye: Secondary | ICD-10-CM | POA: Diagnosis not present

## 2017-04-29 DIAGNOSIS — H43813 Vitreous degeneration, bilateral: Secondary | ICD-10-CM

## 2017-04-29 DIAGNOSIS — H35033 Hypertensive retinopathy, bilateral: Secondary | ICD-10-CM | POA: Diagnosis not present

## 2017-04-29 DIAGNOSIS — H338 Other retinal detachments: Secondary | ICD-10-CM | POA: Diagnosis not present

## 2017-04-29 DIAGNOSIS — I10 Essential (primary) hypertension: Secondary | ICD-10-CM

## 2017-05-27 DIAGNOSIS — R972 Elevated prostate specific antigen [PSA]: Secondary | ICD-10-CM | POA: Diagnosis not present

## 2017-06-03 DIAGNOSIS — R972 Elevated prostate specific antigen [PSA]: Secondary | ICD-10-CM | POA: Diagnosis not present

## 2017-06-03 DIAGNOSIS — R351 Nocturia: Secondary | ICD-10-CM | POA: Diagnosis not present

## 2017-06-03 DIAGNOSIS — N401 Enlarged prostate with lower urinary tract symptoms: Secondary | ICD-10-CM | POA: Diagnosis not present

## 2017-06-05 ENCOUNTER — Ambulatory Visit (INDEPENDENT_AMBULATORY_CARE_PROVIDER_SITE_OTHER): Payer: Medicare Other | Admitting: Family Medicine

## 2017-06-05 VITALS — BP 138/82 | HR 83 | Temp 98.5°F | Resp 12 | Ht 73.0 in | Wt 187.0 lb

## 2017-06-05 DIAGNOSIS — W57XXXA Bitten or stung by nonvenomous insect and other nonvenomous arthropods, initial encounter: Secondary | ICD-10-CM | POA: Diagnosis not present

## 2017-06-05 DIAGNOSIS — S40261A Insect bite (nonvenomous) of right shoulder, initial encounter: Secondary | ICD-10-CM | POA: Diagnosis not present

## 2017-06-05 MED ORDER — DOXYCYCLINE HYCLATE 100 MG PO TABS: ORAL_TABLET | ORAL | 0 refills | Status: DC

## 2017-06-05 NOTE — Progress Notes (Signed)
Subjective:    Patient ID: Roger Kent, male    DOB: [birth year], 71 y.o.   MRN: [id number]  HPI  Roger Kent is a 71 year old male who presents today with his wife for evaluation of a possible tick bite that he noticed 4 days ago on his upper right side of his shoulder. He reports feeling the tick when he noticed "itching" and he asked his wife to look at the area. 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 Wife reported noticing a tick and removing it yesterday and she is concerned that some part of the tick is remaining. Associated pruritis noted when tick was first removed and this has improved. He denies fever, chills, rash, erythema, edema, loose stools, muscle/joint pain, headache,or warmth in area.   No treatment has been tried with exception of removing tick. No aggravating or alleviating factors noted  Review of Systems  Constitutional: Negative for chills, fatigue and fever.  Respiratory: Negative for cough, shortness of breath and wheezing.   Cardiovascular: Negative for chest pain and palpitations.  Gastrointestinal: Negative for abdominal pain, diarrhea, nausea and vomiting.  Musculoskeletal: Negative for myalgias.  Skin:       Tick bite on upper right shoulder  Neurological: Negative for dizziness, light-headedness and headaches.   Past Medical History:  Diagnosis Date  . Acute hepatitis C without mention of hepatic coma(070.51)   . Allergic rhinitis, cause unspecified   . Anxiety state, unspecified   . Barrett's esophagus   . BPH (benign prostatic hyperplasia)   . DVT (deep venous thrombosis) ([place])   . Elevated PSA    Dr Karsten Ro  . Esophageal reflux   . Hiatal hernia   . Hyperlipidemia   . Hypertension   . Hypertonicity of bladder   . Insomnia   . Osteoporosis, unspecified   . Personal history of urinary calculi   . Recent retinal detachment, partial, with giant tear   . Unspecified tinnitus   . Ventricular septal defect        Social History   Social History  . Marital status: Married    Spouse name: Benjimen Kelley  . Number of children: N/A  . Years of education: N/A   Occupational History  . 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 Smoking status: Former Research scientist (life sciences)  . Smokeless tobacco: Never Used  . Alcohol use No  . Drug use: No  . Sexual activity: Yes   Other Topics Concern  . Not on file   Social History Narrative                Past Surgical History:  Procedure Laterality Date  . KNEE SURGERY    . RETINAL DETACHMENT SURGERY      Family History  Problem Relation Age of Onset  . Hypertension Other   . Alcohol abuse Neg Hx     Allergies  Allergen Reactions  . Oxycodone-Acetaminophen Other (See Comments)    Other Reaction: agitation  . Lamisil [Terbinafine Hcl] Rash    Current Outpatient Prescriptions on File Prior to Visit  Medication Sig Dispense Refill  . cholecalciferol (VITAMIN D) 1000 UNITS tablet Take 1,000 Units by mouth every morning.    Marland Kitchen CRANBERRY PO Take 1 capsule by mouth daily.    . DiphenhydrAMINE HCl, Sleep, 25 MG TBDP Take 50 mg by mouth 2 (two) times daily.     Marland Kitchen glucosamine-chondroitin  500-400 MG tablet Take 2 tablets by mouth every morning.     . Homeopathic Products (STRESS/EXHAUSTION RELIEF SL) Take 2 capsules by mouth at bedtime.    . montelukast (SINGULAIR) 10 MG tablet TAKE 1 TABLET BY MOUTH DAILY. 90 tablet 2  . Multiple Vitamin (MULTIVITAMIN WITH MINERALS) TABS tablet Take 1 tablet by mouth every morning.    . olmesartan (BENICAR) 40 MG tablet TAKE 1 TABLET DAILY 90 tablet 1  . omeprazole-sodium bicarbonate (ZEGERID) 40-1100 MG per capsule Take 1 capsule by mouth daily before breakfast.    . OVER THE COUNTER MEDICATION Take 3 capsules by mouth every morning. OTC herbal supplement for prostate health    . OVER THE COUNTER MEDICATION Ultimate Bladder Support 1 each by mouth daily.    . hydrochlorothiazide  (MICROZIDE) 12.5 MG capsule Take 1 capsule (12.5 mg total) by mouth daily. 90 capsule 3   No current facility-administered medications on file prior to visit.     BP 138/82 (BP Location: Left Arm, Patient Position: Sitting, Cuff Size: Normal)   Pulse 83   Temp 98.5 F (36.9 C) (Oral)   Resp 12   Ht 6\' 1"  (1.854 m)   Wt 187 lb (84.8 kg)   SpO2 96%   BMI 24.67 kg/m        Objective:   Physical Exam  Constitutional: He is oriented to person, place, and time. He appears well-developed and well-nourished.  Eyes: Pupils are equal, round, and reactive to light. No scleral icterus.  Neck: Neck supple.  Cardiovascular: Normal rate and regular rhythm.   Pulmonary/Chest: Effort normal and breath sounds normal. He has no wheezes. He has no rales.  Lymphadenopathy:    He has no cervical adenopathy.  Neurological: He is alert and oriented to person, place, and time.  Skin: Skin is warm and dry. No rash noted.  Upper right shoulder has approximately round 1 mm in diameter area with eschar. No erythema, edema, or warmth noted; no visible signs of remaining tick are appreciated  Psychiatric: He has a normal mood and affect. His behavior is normal. Judgment and thought content normal.      Assessment & Plan:  1. Tick bite, initial encounter Area healing; will empirically treat with single dose of doxycycline due to attachment >36 hours and removed within 72 hours. Patient and wife are highly concerned with tick bite; advised patient regarding reasons to empirically treat and also signs/symptoms to monitor for regarding tick borne illness. Area is improving; patient is doing well; low suspicion for tick borne illness at this time. - doxycycline (VIBRA-TABS) 100 MG tablet; Take both tablets by mouth at once with food  Dispense: 2 tablet; Refill: 0  Delano Metz, FNP-C

## 2017-06-05 NOTE — Patient Instructions (Signed)
Please take medication as directed with food and follow up with PCP if area does not continue to improve, worsen, or you develop fever or rash. Please see information below    Tick Bite Information Introduction Ticks are insects that attach themselves to the skin. There are many types of ticks. Common types include wood ticks and deer ticks. Sometimes, ticks carry diseases that can make a person very ill. The most common places for ticks to attach themselves are the scalp, neck, armpits, waist, and groin. HOW CAN YOU PREVENT TICK BITES? Take these steps to help prevent tick bites when you are outdoors:  Wear long sleeves and long pants.  Wear white clothes so you can see ticks more easily.  Tuck your pant legs into your socks.  If walking on a trail, stay in the middle of the trail to avoid brushing against bushes.  Avoid walking through areas with long grass.  Put bug spray on all skin that is showing and along boot tops, pant legs, and sleeve cuffs.  Check clothes, hair, and skin often and before going inside.  Brush off any ticks that are not attached.  Take a shower or bath as soon as possible after being outdoors.  HOW SHOULD YOU REMOVE A TICK? Ticks should be removed as soon as possible to help prevent diseases. 1. If latex gloves are available, put them on before trying to remove a tick. 2. Use tweezers to grasp the tick as close to the skin as possible. You may also use curved forceps or a tick removal tool. Grasp the tick as close to its head as possible. Avoid grasping the tick on its body. 3. Pull gently upward until the tick lets go. Do not twist the tick or jerk it suddenly. This may break off the tick's head or mouth parts. 4. Do not squeeze or crush the tick's body. This could force disease-carrying fluids from the tick into your body. 5. After the tick is removed, wash the bite area and your hands with soap and water or alcohol. 6. Apply a small amount of antiseptic  cream or ointment to the bite site. 7. Wash any tools that were used.  Do not try to remove a tick by applying a hot match, petroleum jelly, or fingernail polish to the tick. These methods do not work. They may also increase the chances of disease being spread from the tick bite. WHEN SHOULD YOU SEEK HELP? Contact your health care provider if you are unable to remove a tick or if a part of the tick breaks off in the skin. After a tick bite, you need to watch for signs and symptoms of diseases that can be spread by ticks. Contact your health care provider if you develop any of the following:  Fever.  Rash.  Redness and puffiness (swelling) in the area of the tick bite.  Tender, puffy lymph glands.  Watery poop (diarrhea).  Weight loss.  Cough.  Feeling more tired than normal (fatigue).  Muscle, joint, or bone pain.  Belly (abdominal) pain.  Headache.  Change in your level of consciousness.  Trouble walking or moving your legs.  Loss of feeling (numbness) in the legs.  Loss of movement (paralysis).  Shortness of breath.  Confusion.  Throwing up (vomiting) many times.  This information is not intended to replace advice given to you by your health care provider. Make sure you discuss any questions you have with your health care provider. Document Released: 01/29/2010 Document Revised:  04/11/2016 Document Reviewed: 04/14/2013 Elsevier Interactive Patient Education  [employer].

## 2017-06-25 ENCOUNTER — Encounter: Payer: Self-pay | Admitting: Internal Medicine

## 2017-06-25 ENCOUNTER — Ambulatory Visit (INDEPENDENT_AMBULATORY_CARE_PROVIDER_SITE_OTHER): Payer: Medicare Other | Admitting: Internal Medicine

## 2017-06-25 DIAGNOSIS — R739 Hyperglycemia, unspecified: Secondary | ICD-10-CM | POA: Diagnosis not present

## 2017-06-25 DIAGNOSIS — M25562 Pain in left knee: Secondary | ICD-10-CM | POA: Diagnosis not present

## 2017-06-25 DIAGNOSIS — I10 Essential (primary) hypertension: Secondary | ICD-10-CM

## 2017-06-25 DIAGNOSIS — M25561 Pain in right knee: Secondary | ICD-10-CM

## 2017-06-25 DIAGNOSIS — G8929 Other chronic pain: Secondary | ICD-10-CM | POA: Diagnosis not present

## 2017-06-25 DIAGNOSIS — M25569 Pain in unspecified knee: Secondary | ICD-10-CM

## 2017-06-25 NOTE — Assessment & Plan Note (Signed)
He is in [facility] The pt would like to go to PT

## 2017-06-25 NOTE — Assessment & Plan Note (Signed)
labs

## 2017-06-25 NOTE — Assessment & Plan Note (Signed)
Benicar HCT 

## 2017-06-25 NOTE — Progress Notes (Signed)
Subjective:  Patient ID: Roger Kent, male    DOB: November 01, 1946  Age: 71 y.o. MRN: [id number]  CC: No chief complaint on file.   HPI Roger Kent presents for knee pains, HTN, allergies f/u. He is in [facility] He just came back from [place] trip...  Outpatient Medications Prior to Visit  Medication Sig Dispense Refill  . BROMSITE 0.075 % SOLN     . cholecalciferol (VITAMIN D) 1000 UNITS tablet Take 1,000 Units by mouth every morning.    Marland Kitchen CRANBERRY PO Take 1 capsule by mouth daily.    . DiphenhydrAMINE HCl, Sleep, 25 MG TBDP Take 50 mg by mouth 2 (two) times daily.     Marland Kitchen doxycycline (VIBRA-TABS) 100 MG tablet Take both tablets by mouth at once with food 2 tablet 0  . glucosamine-chondroitin 500-400 MG tablet Take 2 tablets by mouth every morning.     . Homeopathic Products (STRESS/EXHAUSTION RELIEF SL) Take 2 capsules by mouth at bedtime.    . montelukast (SINGULAIR) 10 MG tablet TAKE 1 TABLET BY MOUTH DAILY. 90 tablet 2  . Multiple Vitamin (MULTIVITAMIN WITH MINERALS) TABS tablet Take 1 tablet by mouth every morning.    . olmesartan (BENICAR) 40 MG tablet TAKE 1 TABLET DAILY 90 tablet 1  . omeprazole-sodium bicarbonate (ZEGERID) 40-1100 MG per capsule Take 1 capsule by mouth daily before breakfast.    . OVER THE COUNTER MEDICATION Take 3 capsules by mouth every morning. OTC herbal supplement for prostate health    . OVER THE COUNTER MEDICATION Ultimate Bladder Support 1 each by mouth daily.    . hydrochlorothiazide (MICROZIDE) 12.5 MG capsule Take 1 capsule (12.5 mg total) by mouth daily. 90 capsule 3   No facility-administered medications prior to visit.     ROS Review of Systems  Constitutional: Negative for appetite change, fatigue and unexpected weight change.  HENT: Negative for congestion, nosebleeds, sneezing, sore throat and trouble swallowing.   Eyes: Negative for itching and visual disturbance.  Respiratory: Negative for cough.   Cardiovascular: Negative  for chest pain, palpitations and leg swelling.  Gastrointestinal: Negative for abdominal distention, blood in stool, diarrhea and nausea.  Genitourinary: Negative for frequency and hematuria.  Musculoskeletal: Positive for arthralgias and gait problem. Negative for back pain, joint swelling and neck pain.  Skin: Negative for rash.  Neurological: Negative for dizziness, tremors, speech difficulty and weakness.  Psychiatric/Behavioral: Negative for agitation, dysphoric mood and sleep disturbance. The patient is not nervous/anxious.     Objective:  BP 130/82 (BP Location: Left Arm, Patient Position: Sitting, Cuff Size: Normal)   Pulse 65   Temp 98.9 F (37.2 C) (Oral)   Ht 6\' 1"  (1.854 m)   Wt 187 lb (84.8 kg)   SpO2 95%   BMI 24.67 kg/m   BP Readings from Last 3 Encounters:  06/25/17 130/82  06/05/17 138/82  12/26/16 136/74    Wt Readings from Last 3 Encounters:  06/25/17 187 lb (84.8 kg)  06/05/17 187 lb (84.8 kg)  12/26/16 187 lb 12 oz (85.2 kg)    Physical Exam  Constitutional: He is oriented to person, place, and time. He appears well-developed. No distress.  NAD  HENT:  Mouth/Throat: Oropharynx is clear and moist.  Eyes: Pupils are equal, round, and reactive to light. Conjunctivae are normal.  Neck: Normal range of motion. No JVD present. No thyromegaly present.  Cardiovascular: Normal rate, regular rhythm, normal heart sounds and intact distal pulses.  Exam reveals no gallop and no  friction rub.   No murmur heard. Pulmonary/Chest: Effort normal and breath sounds normal. No respiratory distress. He has no wheezes. He has no rales. He exhibits no tenderness.  Abdominal: Soft. Bowel sounds are normal. He exhibits no distension and no mass. There is no tenderness. There is no rebound and no guarding.  Musculoskeletal: Normal range of motion. He exhibits tenderness. He exhibits no edema.  Lymphadenopathy:    He has no cervical adenopathy.  Neurological: He is alert and  oriented to person, place, and time. He has normal reflexes. No cranial nerve deficit. He exhibits normal muscle tone. He displays a negative Romberg sign. Coordination and gait normal.  Skin: Skin is warm and dry. No rash noted.  Psychiatric: He has a normal mood and affect. His behavior is normal. Judgment and thought content normal.  Knees are tender w/ROM  Lab Results  Component Value Date   WBC 8.8 12/26/2016   HGB 15.3 12/26/2016   HCT 45.2 12/26/2016   PLT 238.0 12/26/2016   GLUCOSE 96 12/26/2016   CHOL 158 12/26/2016   TRIG 79.0 12/26/2016   HDL 47.80 12/26/2016   LDLCALC 95 12/26/2016   ALT 18 12/26/2016   AST 20 12/26/2016   NA 138 12/26/2016   K 4.3 12/26/2016   CL 103 12/26/2016   CREATININE 1.16 12/26/2016   BUN 16 12/26/2016   CO2 30 12/26/2016   TSH 2.69 12/26/2016   PSA 8.38 (H) 12/26/2016   INR 2.4 RATIO (H) 09/17/2007   HGBA1C 5.6 12/22/2015    Dg Chest 2 View  Result Date: 12/26/2016 CLINICAL DATA:  Cough, history of hypertension, hyperlipidemia, gastroesophageal reflux and Barrett's esophagus, former smoker. EXAM: CHEST  2 VIEW COMPARISON:  Chest x-ray dated December 05, 2009 and chest CT scan of December 15, 2009 FINDINGS: The lungs remain hyperinflated. There is no focal infiltrate. The lung markings are coarse at the left lung base but are stable. The heart and pulmonary vascularity are normal. The mediastinum is normal in width. There is aortic arch calcification and tortuosity of the ascending and descending thoracic aorta. There is no pleural effusion. There is mild multilevel degenerative disc disease of the thoracic spine. IMPRESSION: COPD.  Left basilar fibrosis.  No acute pneumonia nor CHF. Thoracic aortic atherosclerosis. Electronically Signed   By: David  Martinique M.D.   On: 12/26/2016 09:10    Assessment & Plan:   There are no diagnoses linked to this encounter. I am having Mr. Zieger maintain his cholecalciferol, OVER THE COUNTER MEDICATION,  glucosamine-chondroitin, multivitamin with minerals, omeprazole-sodium bicarbonate, DiphenhydrAMINE HCl (Sleep), CRANBERRY PO, OVER THE COUNTER MEDICATION, hydrochlorothiazide, Homeopathic Products (STRESS/EXHAUSTION RELIEF SL), olmesartan, montelukast, BROMSITE, and doxycycline.  No orders of the defined types were placed in this encounter.    Follow-up: No Follow-up on file.  Walker Kehr, MD

## 2017-07-09 DIAGNOSIS — M25562 Pain in left knee: Secondary | ICD-10-CM | POA: Diagnosis not present

## 2017-07-09 DIAGNOSIS — M25561 Pain in right knee: Secondary | ICD-10-CM | POA: Diagnosis not present

## 2017-07-09 DIAGNOSIS — R262 Difficulty in walking, not elsewhere classified: Secondary | ICD-10-CM | POA: Diagnosis not present

## 2017-07-09 DIAGNOSIS — M6250 Muscle wasting and atrophy, not elsewhere classified, unspecified site: Secondary | ICD-10-CM | POA: Diagnosis not present

## 2017-07-14 ENCOUNTER — Ambulatory Visit (INDEPENDENT_AMBULATORY_CARE_PROVIDER_SITE_OTHER): Payer: Medicare Other

## 2017-07-14 DIAGNOSIS — Z23 Encounter for immunization: Secondary | ICD-10-CM

## 2017-07-14 DIAGNOSIS — R262 Difficulty in walking, not elsewhere classified: Secondary | ICD-10-CM | POA: Diagnosis not present

## 2017-07-14 DIAGNOSIS — M25562 Pain in left knee: Secondary | ICD-10-CM | POA: Diagnosis not present

## 2017-07-14 DIAGNOSIS — M25561 Pain in right knee: Secondary | ICD-10-CM | POA: Diagnosis not present

## 2017-07-14 DIAGNOSIS — M6250 Muscle wasting and atrophy, not elsewhere classified, unspecified site: Secondary | ICD-10-CM | POA: Diagnosis not present

## 2017-07-17 DIAGNOSIS — M25561 Pain in right knee: Secondary | ICD-10-CM | POA: Diagnosis not present

## 2017-07-17 DIAGNOSIS — M25562 Pain in left knee: Secondary | ICD-10-CM | POA: Diagnosis not present

## 2017-07-17 DIAGNOSIS — R262 Difficulty in walking, not elsewhere classified: Secondary | ICD-10-CM | POA: Diagnosis not present

## 2017-07-17 DIAGNOSIS — M6250 Muscle wasting and atrophy, not elsewhere classified, unspecified site: Secondary | ICD-10-CM | POA: Diagnosis not present

## 2017-07-18 DIAGNOSIS — M25561 Pain in right knee: Secondary | ICD-10-CM | POA: Diagnosis not present

## 2017-07-18 DIAGNOSIS — R262 Difficulty in walking, not elsewhere classified: Secondary | ICD-10-CM | POA: Diagnosis not present

## 2017-07-18 DIAGNOSIS — M25562 Pain in left knee: Secondary | ICD-10-CM | POA: Diagnosis not present

## 2017-07-18 DIAGNOSIS — M6250 Muscle wasting and atrophy, not elsewhere classified, unspecified site: Secondary | ICD-10-CM | POA: Diagnosis not present

## 2017-07-22 DIAGNOSIS — M6250 Muscle wasting and atrophy, not elsewhere classified, unspecified site: Secondary | ICD-10-CM | POA: Diagnosis not present

## 2017-07-22 DIAGNOSIS — Z9229 Personal history of other drug therapy: Secondary | ICD-10-CM | POA: Diagnosis not present

## 2017-07-22 DIAGNOSIS — M25562 Pain in left knee: Secondary | ICD-10-CM | POA: Diagnosis not present

## 2017-07-22 DIAGNOSIS — M170 Bilateral primary osteoarthritis of knee: Secondary | ICD-10-CM | POA: Diagnosis not present

## 2017-07-22 DIAGNOSIS — R262 Difficulty in walking, not elsewhere classified: Secondary | ICD-10-CM | POA: Diagnosis not present

## 2017-07-22 DIAGNOSIS — M25561 Pain in right knee: Secondary | ICD-10-CM | POA: Diagnosis not present

## 2017-07-23 DIAGNOSIS — R262 Difficulty in walking, not elsewhere classified: Secondary | ICD-10-CM | POA: Diagnosis not present

## 2017-07-23 DIAGNOSIS — M6250 Muscle wasting and atrophy, not elsewhere classified, unspecified site: Secondary | ICD-10-CM | POA: Diagnosis not present

## 2017-07-23 DIAGNOSIS — M25562 Pain in left knee: Secondary | ICD-10-CM | POA: Diagnosis not present

## 2017-07-23 DIAGNOSIS — M25561 Pain in right knee: Secondary | ICD-10-CM | POA: Diagnosis not present

## 2017-07-24 DIAGNOSIS — M25561 Pain in right knee: Secondary | ICD-10-CM | POA: Diagnosis not present

## 2017-07-24 DIAGNOSIS — R262 Difficulty in walking, not elsewhere classified: Secondary | ICD-10-CM | POA: Diagnosis not present

## 2017-07-24 DIAGNOSIS — M6250 Muscle wasting and atrophy, not elsewhere classified, unspecified site: Secondary | ICD-10-CM | POA: Diagnosis not present

## 2017-07-24 DIAGNOSIS — M25562 Pain in left knee: Secondary | ICD-10-CM | POA: Diagnosis not present

## 2017-07-29 DIAGNOSIS — M170 Bilateral primary osteoarthritis of knee: Secondary | ICD-10-CM | POA: Diagnosis not present

## 2017-07-29 DIAGNOSIS — M25562 Pain in left knee: Secondary | ICD-10-CM | POA: Diagnosis not present

## 2017-07-29 DIAGNOSIS — M25561 Pain in right knee: Secondary | ICD-10-CM | POA: Diagnosis not present

## 2017-07-29 DIAGNOSIS — R262 Difficulty in walking, not elsewhere classified: Secondary | ICD-10-CM | POA: Diagnosis not present

## 2017-07-29 DIAGNOSIS — M6250 Muscle wasting and atrophy, not elsewhere classified, unspecified site: Secondary | ICD-10-CM | POA: Diagnosis not present

## 2017-07-30 DIAGNOSIS — R262 Difficulty in walking, not elsewhere classified: Secondary | ICD-10-CM | POA: Diagnosis not present

## 2017-07-30 DIAGNOSIS — M25561 Pain in right knee: Secondary | ICD-10-CM | POA: Diagnosis not present

## 2017-07-30 DIAGNOSIS — M25562 Pain in left knee: Secondary | ICD-10-CM | POA: Diagnosis not present

## 2017-07-30 DIAGNOSIS — M6250 Muscle wasting and atrophy, not elsewhere classified, unspecified site: Secondary | ICD-10-CM | POA: Diagnosis not present

## 2017-07-31 DIAGNOSIS — M6250 Muscle wasting and atrophy, not elsewhere classified, unspecified site: Secondary | ICD-10-CM | POA: Diagnosis not present

## 2017-07-31 DIAGNOSIS — R262 Difficulty in walking, not elsewhere classified: Secondary | ICD-10-CM | POA: Diagnosis not present

## 2017-07-31 DIAGNOSIS — M25561 Pain in right knee: Secondary | ICD-10-CM | POA: Diagnosis not present

## 2017-07-31 DIAGNOSIS — M25562 Pain in left knee: Secondary | ICD-10-CM | POA: Diagnosis not present

## 2017-08-04 DIAGNOSIS — R262 Difficulty in walking, not elsewhere classified: Secondary | ICD-10-CM | POA: Diagnosis not present

## 2017-08-04 DIAGNOSIS — M25562 Pain in left knee: Secondary | ICD-10-CM | POA: Diagnosis not present

## 2017-08-04 DIAGNOSIS — M6250 Muscle wasting and atrophy, not elsewhere classified, unspecified site: Secondary | ICD-10-CM | POA: Diagnosis not present

## 2017-08-04 DIAGNOSIS — M25561 Pain in right knee: Secondary | ICD-10-CM | POA: Diagnosis not present

## 2017-08-05 DIAGNOSIS — M25561 Pain in right knee: Secondary | ICD-10-CM | POA: Diagnosis not present

## 2017-08-05 DIAGNOSIS — M25562 Pain in left knee: Secondary | ICD-10-CM | POA: Diagnosis not present

## 2017-08-05 DIAGNOSIS — M170 Bilateral primary osteoarthritis of knee: Secondary | ICD-10-CM | POA: Diagnosis not present

## 2017-08-06 DIAGNOSIS — M6250 Muscle wasting and atrophy, not elsewhere classified, unspecified site: Secondary | ICD-10-CM | POA: Diagnosis not present

## 2017-08-06 DIAGNOSIS — M25561 Pain in right knee: Secondary | ICD-10-CM | POA: Diagnosis not present

## 2017-08-06 DIAGNOSIS — R262 Difficulty in walking, not elsewhere classified: Secondary | ICD-10-CM | POA: Diagnosis not present

## 2017-08-06 DIAGNOSIS — M25562 Pain in left knee: Secondary | ICD-10-CM | POA: Diagnosis not present

## 2017-08-07 DIAGNOSIS — M6250 Muscle wasting and atrophy, not elsewhere classified, unspecified site: Secondary | ICD-10-CM | POA: Diagnosis not present

## 2017-08-07 DIAGNOSIS — M25562 Pain in left knee: Secondary | ICD-10-CM | POA: Diagnosis not present

## 2017-08-07 DIAGNOSIS — R262 Difficulty in walking, not elsewhere classified: Secondary | ICD-10-CM | POA: Diagnosis not present

## 2017-08-07 DIAGNOSIS — M25561 Pain in right knee: Secondary | ICD-10-CM | POA: Diagnosis not present

## 2017-08-11 DIAGNOSIS — M25562 Pain in left knee: Secondary | ICD-10-CM | POA: Diagnosis not present

## 2017-08-11 DIAGNOSIS — M6250 Muscle wasting and atrophy, not elsewhere classified, unspecified site: Secondary | ICD-10-CM | POA: Diagnosis not present

## 2017-08-11 DIAGNOSIS — M25561 Pain in right knee: Secondary | ICD-10-CM | POA: Diagnosis not present

## 2017-08-11 DIAGNOSIS — R262 Difficulty in walking, not elsewhere classified: Secondary | ICD-10-CM | POA: Diagnosis not present

## 2017-08-12 DIAGNOSIS — M25562 Pain in left knee: Secondary | ICD-10-CM | POA: Diagnosis not present

## 2017-08-12 DIAGNOSIS — M170 Bilateral primary osteoarthritis of knee: Secondary | ICD-10-CM | POA: Diagnosis not present

## 2017-08-12 DIAGNOSIS — M25561 Pain in right knee: Secondary | ICD-10-CM | POA: Diagnosis not present

## 2017-08-13 DIAGNOSIS — M25562 Pain in left knee: Secondary | ICD-10-CM | POA: Diagnosis not present

## 2017-08-13 DIAGNOSIS — M6250 Muscle wasting and atrophy, not elsewhere classified, unspecified site: Secondary | ICD-10-CM | POA: Diagnosis not present

## 2017-08-13 DIAGNOSIS — R262 Difficulty in walking, not elsewhere classified: Secondary | ICD-10-CM | POA: Diagnosis not present

## 2017-08-13 DIAGNOSIS — M25561 Pain in right knee: Secondary | ICD-10-CM | POA: Diagnosis not present

## 2017-08-14 DIAGNOSIS — M25562 Pain in left knee: Secondary | ICD-10-CM | POA: Diagnosis not present

## 2017-08-14 DIAGNOSIS — M6250 Muscle wasting and atrophy, not elsewhere classified, unspecified site: Secondary | ICD-10-CM | POA: Diagnosis not present

## 2017-08-14 DIAGNOSIS — R262 Difficulty in walking, not elsewhere classified: Secondary | ICD-10-CM | POA: Diagnosis not present

## 2017-08-14 DIAGNOSIS — M25561 Pain in right knee: Secondary | ICD-10-CM | POA: Diagnosis not present

## 2017-08-18 DIAGNOSIS — M6250 Muscle wasting and atrophy, not elsewhere classified, unspecified site: Secondary | ICD-10-CM | POA: Diagnosis not present

## 2017-08-18 DIAGNOSIS — R262 Difficulty in walking, not elsewhere classified: Secondary | ICD-10-CM | POA: Diagnosis not present

## 2017-08-18 DIAGNOSIS — M25561 Pain in right knee: Secondary | ICD-10-CM | POA: Diagnosis not present

## 2017-08-18 DIAGNOSIS — M25562 Pain in left knee: Secondary | ICD-10-CM | POA: Diagnosis not present

## 2017-08-20 DIAGNOSIS — M6250 Muscle wasting and atrophy, not elsewhere classified, unspecified site: Secondary | ICD-10-CM | POA: Diagnosis not present

## 2017-08-20 DIAGNOSIS — M170 Bilateral primary osteoarthritis of knee: Secondary | ICD-10-CM | POA: Diagnosis not present

## 2017-08-20 DIAGNOSIS — M25561 Pain in right knee: Secondary | ICD-10-CM | POA: Diagnosis not present

## 2017-08-20 DIAGNOSIS — M25562 Pain in left knee: Secondary | ICD-10-CM | POA: Diagnosis not present

## 2017-08-20 DIAGNOSIS — R262 Difficulty in walking, not elsewhere classified: Secondary | ICD-10-CM | POA: Diagnosis not present

## 2017-08-28 DIAGNOSIS — M25562 Pain in left knee: Secondary | ICD-10-CM | POA: Diagnosis not present

## 2017-08-28 DIAGNOSIS — M6250 Muscle wasting and atrophy, not elsewhere classified, unspecified site: Secondary | ICD-10-CM | POA: Diagnosis not present

## 2017-08-28 DIAGNOSIS — R262 Difficulty in walking, not elsewhere classified: Secondary | ICD-10-CM | POA: Diagnosis not present

## 2017-08-28 DIAGNOSIS — M25561 Pain in right knee: Secondary | ICD-10-CM | POA: Diagnosis not present

## 2017-08-29 DIAGNOSIS — R262 Difficulty in walking, not elsewhere classified: Secondary | ICD-10-CM | POA: Diagnosis not present

## 2017-08-29 DIAGNOSIS — M25562 Pain in left knee: Secondary | ICD-10-CM | POA: Diagnosis not present

## 2017-08-29 DIAGNOSIS — M25561 Pain in right knee: Secondary | ICD-10-CM | POA: Diagnosis not present

## 2017-08-29 DIAGNOSIS — M6250 Muscle wasting and atrophy, not elsewhere classified, unspecified site: Secondary | ICD-10-CM | POA: Diagnosis not present

## 2017-08-30 ENCOUNTER — Other Ambulatory Visit: Payer: Self-pay | Admitting: Internal Medicine

## 2017-09-02 DIAGNOSIS — M25562 Pain in left knee: Secondary | ICD-10-CM | POA: Diagnosis not present

## 2017-09-02 DIAGNOSIS — R262 Difficulty in walking, not elsewhere classified: Secondary | ICD-10-CM | POA: Diagnosis not present

## 2017-09-02 DIAGNOSIS — M6250 Muscle wasting and atrophy, not elsewhere classified, unspecified site: Secondary | ICD-10-CM | POA: Diagnosis not present

## 2017-09-02 DIAGNOSIS — M25561 Pain in right knee: Secondary | ICD-10-CM | POA: Diagnosis not present

## 2017-09-04 DIAGNOSIS — M6250 Muscle wasting and atrophy, not elsewhere classified, unspecified site: Secondary | ICD-10-CM | POA: Diagnosis not present

## 2017-09-04 DIAGNOSIS — M25561 Pain in right knee: Secondary | ICD-10-CM | POA: Diagnosis not present

## 2017-09-04 DIAGNOSIS — R262 Difficulty in walking, not elsewhere classified: Secondary | ICD-10-CM | POA: Diagnosis not present

## 2017-09-04 DIAGNOSIS — M25562 Pain in left knee: Secondary | ICD-10-CM | POA: Diagnosis not present

## 2017-09-08 ENCOUNTER — Other Ambulatory Visit: Payer: Self-pay | Admitting: *Deleted

## 2017-09-08 DIAGNOSIS — R262 Difficulty in walking, not elsewhere classified: Secondary | ICD-10-CM | POA: Diagnosis not present

## 2017-09-08 DIAGNOSIS — M6250 Muscle wasting and atrophy, not elsewhere classified, unspecified site: Secondary | ICD-10-CM | POA: Diagnosis not present

## 2017-09-08 DIAGNOSIS — R0989 Other specified symptoms and signs involving the circulatory and respiratory systems: Secondary | ICD-10-CM

## 2017-09-08 DIAGNOSIS — M25561 Pain in right knee: Secondary | ICD-10-CM | POA: Diagnosis not present

## 2017-09-08 DIAGNOSIS — Z87891 Personal history of nicotine dependence: Secondary | ICD-10-CM

## 2017-09-08 DIAGNOSIS — M25562 Pain in left knee: Secondary | ICD-10-CM | POA: Diagnosis not present

## 2017-09-10 DIAGNOSIS — M6250 Muscle wasting and atrophy, not elsewhere classified, unspecified site: Secondary | ICD-10-CM | POA: Diagnosis not present

## 2017-09-10 DIAGNOSIS — M25562 Pain in left knee: Secondary | ICD-10-CM | POA: Diagnosis not present

## 2017-09-10 DIAGNOSIS — R262 Difficulty in walking, not elsewhere classified: Secondary | ICD-10-CM | POA: Diagnosis not present

## 2017-09-10 DIAGNOSIS — M25561 Pain in right knee: Secondary | ICD-10-CM | POA: Diagnosis not present

## 2017-09-24 ENCOUNTER — Ambulatory Visit: Payer: Medicare Other | Admitting: Internal Medicine

## 2017-09-30 ENCOUNTER — Ambulatory Visit (INDEPENDENT_AMBULATORY_CARE_PROVIDER_SITE_OTHER): Payer: Medicare Other | Admitting: Internal Medicine

## 2017-09-30 ENCOUNTER — Encounter: Payer: Self-pay | Admitting: Internal Medicine

## 2017-09-30 DIAGNOSIS — M25561 Pain in right knee: Secondary | ICD-10-CM

## 2017-09-30 DIAGNOSIS — G8929 Other chronic pain: Secondary | ICD-10-CM

## 2017-09-30 DIAGNOSIS — M25562 Pain in left knee: Secondary | ICD-10-CM | POA: Diagnosis not present

## 2017-09-30 DIAGNOSIS — R350 Frequency of micturition: Secondary | ICD-10-CM

## 2017-09-30 DIAGNOSIS — H9190 Unspecified hearing loss, unspecified ear: Secondary | ICD-10-CM | POA: Insufficient documentation

## 2017-09-30 DIAGNOSIS — N401 Enlarged prostate with lower urinary tract symptoms: Secondary | ICD-10-CM

## 2017-09-30 DIAGNOSIS — H9193 Unspecified hearing loss, bilateral: Secondary | ICD-10-CM | POA: Diagnosis not present

## 2017-09-30 DIAGNOSIS — M545 Low back pain: Secondary | ICD-10-CM | POA: Diagnosis not present

## 2017-09-30 NOTE — Assessment & Plan Note (Signed)
Zegerid 

## 2017-09-30 NOTE — Assessment & Plan Note (Signed)
Better after [facility]

## 2017-09-30 NOTE — Assessment & Plan Note (Addendum)
Dr Thornell Mule ref Chronic w/tinnitus

## 2017-09-30 NOTE — Assessment & Plan Note (Signed)
PSA w/Dr Ottelin 

## 2017-09-30 NOTE — Progress Notes (Signed)
Subjective:  Patient ID: Roger Kent, male    DOB: 1946-05-01  Age: 71 y.o. MRN: [id number]  CC: No chief complaint on file.   HPI Roger Kent presents for LBP, OA, allergies. Better w/PT   Outpatient Medications Prior to Visit  Medication Sig Dispense Refill  . BROMSITE 0.075 % SOLN 1 drop at bedtime.     . cholecalciferol (VITAMIN D) 1000 UNITS tablet Take 1,000 Units by mouth every morning.    Marland Kitchen CRANBERRY PO Take 1 capsule by mouth daily.    Marland Kitchen doxycycline (VIBRA-TABS) 100 MG tablet Take both tablets by mouth at once with food 2 tablet 0  . glucosamine-chondroitin 500-400 MG tablet Take 2 tablets by mouth every morning.     . Homeopathic Products (STRESS/EXHAUSTION RELIEF SL) Take 2 capsules by mouth at bedtime.    . montelukast (SINGULAIR) 10 MG tablet TAKE 1 TABLET BY MOUTH DAILY. 90 tablet 2  . Multiple Vitamin (MULTIVITAMIN WITH MINERALS) TABS tablet Take 1 tablet by mouth every morning.    . olmesartan (BENICAR) 40 MG tablet TAKE 1 TABLET DAILY 90 tablet 1  . omeprazole-sodium bicarbonate (ZEGERID) 40-1100 MG per capsule Take 1 capsule by mouth daily before breakfast.    . OVER THE COUNTER MEDICATION Take 3 capsules by mouth every morning. OTC herbal supplement for prostate health    . OVER THE COUNTER MEDICATION Ultimate Bladder Support 1 each by mouth daily.    . hydrochlorothiazide (MICROZIDE) 12.5 MG capsule Take 1 capsule (12.5 mg total) by mouth daily. 90 capsule 3  . DiphenhydrAMINE HCl, Sleep, 25 MG TBDP Take 50 mg by mouth 2 (two) times daily.     Marland Kitchen olmesartan (BENICAR) 40 MG tablet TAKE 1 TABLET DAILY 90 tablet 1   No facility-administered medications prior to visit.     ROS Review of Systems  Constitutional: Negative for appetite change, fatigue and unexpected weight change.  HENT: Positive for congestion and hearing loss. Negative for nosebleeds, sneezing, sore throat and trouble swallowing.   Eyes: Negative for itching and visual disturbance.    Respiratory: Positive for cough.   Cardiovascular: Negative for chest pain, palpitations and leg swelling.  Gastrointestinal: Negative for abdominal distention, blood in stool, diarrhea and nausea.  Genitourinary: Positive for frequency and urgency. Negative for hematuria.  Musculoskeletal: Positive for arthralgias, back pain and gait problem. Negative for joint swelling and neck pain.  Skin: Negative for rash.  Neurological: Negative for dizziness, tremors, speech difficulty and weakness.  Psychiatric/Behavioral: Negative for agitation, dysphoric mood and sleep disturbance. The patient is not nervous/anxious.     Objective:  BP 128/72 (BP Location: Left Arm, Patient Position: Sitting, Cuff Size: Large)   Pulse 74   Temp 98.3 F (36.8 C) (Oral)   Ht 6\' 1"  (1.854 m)   Wt 191 lb (86.6 kg)   SpO2 96%   BMI 25.20 kg/m   BP Readings from Last 3 Encounters:  09/30/17 128/72  06/25/17 130/82  06/05/17 138/82    Wt Readings from Last 3 Encounters:  09/30/17 191 lb (86.6 kg)  06/25/17 187 lb (84.8 kg)  06/05/17 187 lb (84.8 kg)    Physical Exam  Constitutional: He is oriented to person, place, and time. He appears well-developed. No distress.  NAD  HENT:  Mouth/Throat: Oropharynx is clear and moist.  Eyes: Conjunctivae are normal. Pupils are equal, round, and reactive to light.  Neck: Normal range of motion. No JVD present. No thyromegaly present.  Cardiovascular: Normal rate, regular  rhythm, normal heart sounds and intact distal pulses. Exam reveals no gallop and no friction rub.  No murmur heard. Pulmonary/Chest: Effort normal and breath sounds normal. No respiratory distress. He has no wheezes. He has no rales. He exhibits no tenderness.  Abdominal: Soft. Bowel sounds are normal. He exhibits no distension and no mass. There is no tenderness. There is no rebound and no guarding.  Musculoskeletal: Normal range of motion. He exhibits no edema or tenderness.  Lymphadenopathy:     He has no cervical adenopathy.  Neurological: He is alert and oriented to person, place, and time. He has normal reflexes. No cranial nerve deficit. He exhibits normal muscle tone. He displays a negative Romberg sign. Coordination and gait normal.  Skin: Skin is warm and dry. No rash noted.  Psychiatric: He has a normal mood and affect. His behavior is normal. Judgment and thought content normal.    Lab Results  Component Value Date   WBC 8.8 12/26/2016   HGB 15.3 12/26/2016   HCT 45.2 12/26/2016   PLT 238.0 12/26/2016   GLUCOSE 96 12/26/2016   CHOL 158 12/26/2016   TRIG 79.0 12/26/2016   HDL 47.80 12/26/2016   LDLCALC 95 12/26/2016   ALT 18 12/26/2016   AST 20 12/26/2016   NA 138 12/26/2016   K 4.3 12/26/2016   CL 103 12/26/2016   CREATININE 1.16 12/26/2016   BUN 16 12/26/2016   CO2 30 12/26/2016   TSH 2.69 12/26/2016   PSA 8.38 (H) 12/26/2016   INR 2.4 RATIO (H) 09/17/2007   HGBA1C 5.6 12/22/2015    Dg Chest 2 View  Result Date: 12/26/2016 CLINICAL DATA:  Cough, history of hypertension, hyperlipidemia, gastroesophageal reflux and Barrett's esophagus, former smoker. EXAM: CHEST  2 VIEW COMPARISON:  Chest x-ray dated December 05, 2009 and chest CT scan of December 15, 2009 FINDINGS: The lungs remain hyperinflated. There is no focal infiltrate. The lung markings are coarse at the left lung base but are stable. The heart and pulmonary vascularity are normal. The mediastinum is normal in width. There is aortic arch calcification and tortuosity of the ascending and descending thoracic aorta. There is no pleural effusion. There is mild multilevel degenerative disc disease of the thoracic spine. IMPRESSION: COPD.  Left basilar fibrosis.  No acute pneumonia nor CHF. Thoracic aortic atherosclerosis. Electronically Signed   By: David  Martinique M.D.   On: 12/26/2016 09:10    Assessment & Plan:   There are no diagnoses linked to this encounter. I have discontinued Marily Lente. Mcinerney's  DiphenhydrAMINE HCl (Sleep). I am also having him maintain his cholecalciferol, OVER THE COUNTER MEDICATION, glucosamine-chondroitin, multivitamin with minerals, omeprazole-sodium bicarbonate, CRANBERRY PO, OVER THE COUNTER MEDICATION, hydrochlorothiazide, Homeopathic Products (STRESS/EXHAUSTION RELIEF SL), olmesartan, montelukast, BROMSITE, and doxycycline.  No orders of the defined types were placed in this encounter.    Follow-up: No Follow-up on file.  Walker Kehr, MD

## 2017-10-29 ENCOUNTER — Encounter (INDEPENDENT_AMBULATORY_CARE_PROVIDER_SITE_OTHER): Payer: Medicare Other | Admitting: Ophthalmology

## 2017-10-29 DIAGNOSIS — H35372 Puckering of macula, left eye: Secondary | ICD-10-CM | POA: Diagnosis not present

## 2017-10-29 DIAGNOSIS — H35033 Hypertensive retinopathy, bilateral: Secondary | ICD-10-CM | POA: Diagnosis not present

## 2017-10-29 DIAGNOSIS — H43813 Vitreous degeneration, bilateral: Secondary | ICD-10-CM | POA: Diagnosis not present

## 2017-10-29 DIAGNOSIS — I10 Essential (primary) hypertension: Secondary | ICD-10-CM

## 2017-10-29 DIAGNOSIS — H59031 Cystoid macular edema following cataract surgery, right eye: Secondary | ICD-10-CM | POA: Diagnosis not present

## 2017-10-29 DIAGNOSIS — H338 Other retinal detachments: Secondary | ICD-10-CM

## 2017-11-20 ENCOUNTER — Telehealth: Payer: Self-pay | Admitting: Cardiology

## 2017-11-20 NOTE — Telephone Encounter (Signed)
Spoke with pt, AAA dopplers scheduled and follow up scheduled with dr Stanford Breed.

## 2017-11-20 NOTE — Telephone Encounter (Signed)
New Message    Patient called and stated needed appt with Roger Kent , he states Roger Kent said for him to have appt in Jan. He does not want APP or April appt.

## 2017-11-24 DIAGNOSIS — R972 Elevated prostate specific antigen [PSA]: Secondary | ICD-10-CM | POA: Diagnosis not present

## 2017-12-05 ENCOUNTER — Inpatient Hospital Stay (HOSPITAL_COMMUNITY): Admission: RE | Admit: 2017-12-05 | Payer: Medicare Other | Source: Ambulatory Visit

## 2017-12-09 ENCOUNTER — Other Ambulatory Visit (HOSPITAL_COMMUNITY): Payer: Medicare Other

## 2017-12-10 DIAGNOSIS — M170 Bilateral primary osteoarthritis of knee: Secondary | ICD-10-CM | POA: Diagnosis not present

## 2017-12-10 DIAGNOSIS — M25562 Pain in left knee: Secondary | ICD-10-CM | POA: Diagnosis not present

## 2017-12-10 DIAGNOSIS — H903 Sensorineural hearing loss, bilateral: Secondary | ICD-10-CM | POA: Diagnosis not present

## 2017-12-10 DIAGNOSIS — M25561 Pain in right knee: Secondary | ICD-10-CM | POA: Diagnosis not present

## 2017-12-16 DIAGNOSIS — M25562 Pain in left knee: Secondary | ICD-10-CM | POA: Diagnosis not present

## 2017-12-16 DIAGNOSIS — M1712 Unilateral primary osteoarthritis, left knee: Secondary | ICD-10-CM | POA: Diagnosis not present

## 2017-12-18 ENCOUNTER — Ambulatory Visit (HOSPITAL_COMMUNITY)
Admission: RE | Admit: 2017-12-18 | Discharge: 2017-12-18 | Disposition: A | Discharge status: Home / Self Care | Payer: Medicare Other | Source: Ambulatory Visit | Attending: Cardiovascular Disease | Admitting: Cardiovascular Disease

## 2017-12-18 DIAGNOSIS — R0989 Other specified symptoms and signs involving the circulatory and respiratory systems: Secondary | ICD-10-CM | POA: Diagnosis not present

## 2017-12-18 DIAGNOSIS — I728 Aneurysm of other specified arteries: Secondary | ICD-10-CM | POA: Insufficient documentation

## 2017-12-18 DIAGNOSIS — Z87891 Personal history of nicotine dependence: Secondary | ICD-10-CM | POA: Diagnosis not present

## 2017-12-22 ENCOUNTER — Other Ambulatory Visit: Payer: Self-pay | Admitting: *Deleted

## 2017-12-22 DIAGNOSIS — I714 Abdominal aortic aneurysm, without rupture, unspecified: Secondary | ICD-10-CM

## 2017-12-22 DIAGNOSIS — I723 Aneurysm of iliac artery: Secondary | ICD-10-CM

## 2017-12-23 DIAGNOSIS — M1711 Unilateral primary osteoarthritis, right knee: Secondary | ICD-10-CM | POA: Diagnosis not present

## 2017-12-23 DIAGNOSIS — M25561 Pain in right knee: Secondary | ICD-10-CM | POA: Diagnosis not present

## 2017-12-29 ENCOUNTER — Telehealth: Payer: Self-pay | Admitting: Internal Medicine

## 2017-12-29 ENCOUNTER — Other Ambulatory Visit: Payer: Self-pay | Admitting: *Deleted

## 2017-12-29 MED ORDER — MONTELUKAST SODIUM 10 MG PO TABS: 10.0000 mg | ORAL_TABLET | Freq: Every day | ORAL | 1 refills | Status: DC

## 2017-12-29 NOTE — Telephone Encounter (Signed)
Copied from [place]. Topic: Quick Communication - Rx Refill/Question >> Dec 29, 2017  9:29 AM Marin Olp L wrote: Medication: montelukast (SINGULAIR) 10 MG tablet Has the patient contacted their pharmacy? No. Switch pharms (Agent: If no, request that the patient contact the pharmacy for the refill.) Preferred Pharmacy (with phone number or street name): [facility] [place], [place] to Registered [place]: [phone] Fax: [phone] Agent: Please be advised that RX refills may take up to 3 business days. We ask that you follow-up with your pharmacy.

## 2017-12-29 NOTE — Telephone Encounter (Signed)
Rx refilled per protocol to new pharmacy- LOV- 09/30/17 with next appt 04/06/18

## 2017-12-30 DIAGNOSIS — M170 Bilateral primary osteoarthritis of knee: Secondary | ICD-10-CM | POA: Diagnosis not present

## 2017-12-30 DIAGNOSIS — M25562 Pain in left knee: Secondary | ICD-10-CM | POA: Diagnosis not present

## 2017-12-30 DIAGNOSIS — M25561 Pain in right knee: Secondary | ICD-10-CM | POA: Diagnosis not present

## 2018-01-12 NOTE — Progress Notes (Signed)
HPI: FU congenital VSD, and prior DVT. Note a previous nuclear study performed in January of 2003 showed an ejection fraction of 50% and no ischemia or infarction. Echocardiogram 12/17 showed normal LV systolic function, grade 1 diastolic dysfunction, small perimembranous VSD, trivial aortic insufficiency, mild left atrial enlargement, mild right ventricular enlargement. Abdominal ultrasound 1/19 showed dilated iliacs (R 1.7 and L 1.6) and abd aorta 2.5 cm. Since I last saw him the patient has dyspnea with more extreme activities but not with routine activities. It is relieved with rest. It is not associated with chest pain. There is no orthopnea, PND or pedal edema. There is no syncope or palpitations. There is no exertional chest pain.   Current Outpatient Medications  Medication Sig Dispense Refill  . BROMSITE 0.075 % SOLN 1 drop at bedtime.     . cholecalciferol (VITAMIN D) 1000 UNITS tablet Take 1,000 Units by mouth every morning.    Marland Kitchen CRANBERRY PO Take 1 capsule by mouth daily.    Marland Kitchen glucosamine-chondroitin 500-400 MG tablet Take 2 tablets by mouth every morning.     . Homeopathic Products (STRESS/EXHAUSTION RELIEF SL) Take 2 capsules by mouth at bedtime.    . montelukast (SINGULAIR) 10 MG tablet Take 1 tablet (10 mg total) by mouth daily. 90 tablet 1  . Multiple Vitamin (MULTIVITAMIN WITH MINERALS) TABS tablet Take 1 tablet by mouth every morning.    . olmesartan (BENICAR) 40 MG tablet TAKE 1 TABLET DAILY 90 tablet 1  . omeprazole-sodium bicarbonate (ZEGERID) 40-1100 MG per capsule Take 1 capsule by mouth daily before breakfast.    . OVER THE COUNTER MEDICATION Take 3 capsules by mouth every morning. OTC herbal supplement for prostate health    . OVER THE COUNTER MEDICATION Ultimate Bladder Support 1 each by mouth daily.    . hydrochlorothiazide (MICROZIDE) 12.5 MG capsule Take 1 capsule (12.5 mg total) by mouth daily. 90 capsule 3   No current facility-administered medications for  this visit.      Past Medical History:  Diagnosis Date  . Acute hepatitis C without mention of hepatic coma(070.51)   . Allergic rhinitis, cause unspecified   . Anxiety state, unspecified   . Barrett's esophagus   . BPH (benign prostatic hyperplasia)   . DVT (deep venous thrombosis) ([place])   . Elevated PSA    Dr Karsten Ro  . Esophageal reflux   . Hiatal hernia   . Hyperlipidemia   . Hypertension   . Hypertonicity of bladder   . Insomnia   . Osteoporosis, unspecified   . Personal history of urinary calculi   . Recent retinal detachment, partial, with giant tear   . Unspecified tinnitus   . Ventricular septal defect     Past Surgical History:  Procedure Laterality Date  . KNEE SURGERY    . RETINAL DETACHMENT SURGERY      Social History   Socioeconomic History  . Marital status: Married    Spouse name: Siah Steely  . Number of children: Not on file  . Years of education: Not on file  . Highest education level: Not on file  Social Needs  . Financial resource strain: Not on file  . Food insecurity - worry: Not on file  . Food insecurity - inability: Not on file  . Transportation needs - medical: Not on file  . Transportation needs - non-medical: Not on file  Occupational History  . 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 Occupation: bee keepin  Tobacco Use  . Smoking status: Former Research scientist (life sciences)  . Smokeless tobacco: Never Used  Substance and Sexual Activity  . Alcohol use: No  . Drug use: No  . Sexual activity: Yes  Other Topics Concern  . Not on file  Social History Narrative                Family History  Problem Relation Age of Onset  . Hypertension Other   . Alcohol abuse Neg Hx     ROS: no fevers or chills, productive cough, hemoptysis, dysphasia, odynophagia, melena, hematochezia, dysuria, hematuria, rash, seizure activity, orthopnea, PND, pedal edema, claudication. Remaining systems are negative.  Physical  Exam: Well-developed well-nourished in no acute distress.  Skin is warm and dry.  HEENT is normal.  Neck is supple.  Chest is clear to auscultation with normal expansion.  Cardiovascular exam is regular rate and rhythm. 3/6 systolic murmur Abdominal exam nontender or distended. No masses palpated. Extremities show no edema. neuro grossly intact  ECG-sinus rhythm at a rate of 64.  No ST changes.  Personally reviewed  A/P  1 perimembranous ventricular septal defect-we will plan to repeat echocardiogram.  We will not pursue repair unless patient develops significant right ventricular or right atrial enlargement.  2 hypertension-blood pressure is controlled.  Continue present medications.  3 iliac aneurysms-follow-up ultrasound January 2020.  4 hyperlipidemia-managed by primary care.  Kirk Ruths, MD

## 2018-01-13 ENCOUNTER — Ambulatory Visit (INDEPENDENT_AMBULATORY_CARE_PROVIDER_SITE_OTHER): Payer: Medicare Other | Admitting: Cardiology

## 2018-01-13 ENCOUNTER — Encounter: Payer: Self-pay | Admitting: Cardiology

## 2018-01-13 VITALS — BP 118/80 | HR 64 | Ht 73.0 in | Wt 195.0 lb

## 2018-01-13 DIAGNOSIS — E7800 Pure hypercholesterolemia, unspecified: Secondary | ICD-10-CM

## 2018-01-13 DIAGNOSIS — I10 Essential (primary) hypertension: Secondary | ICD-10-CM

## 2018-01-13 DIAGNOSIS — Q210 Ventricular septal defect: Secondary | ICD-10-CM

## 2018-01-13 NOTE — Patient Instructions (Signed)

## 2018-01-20 ENCOUNTER — Other Ambulatory Visit: Payer: Self-pay

## 2018-01-20 ENCOUNTER — Ambulatory Visit (HOSPITAL_COMMUNITY): Payer: Medicare Other | Attending: Cardiovascular Disease

## 2018-01-20 DIAGNOSIS — E785 Hyperlipidemia, unspecified: Secondary | ICD-10-CM | POA: Diagnosis not present

## 2018-01-20 DIAGNOSIS — Z86718 Personal history of other venous thrombosis and embolism: Secondary | ICD-10-CM | POA: Insufficient documentation

## 2018-01-20 DIAGNOSIS — I071 Rheumatic tricuspid insufficiency: Secondary | ICD-10-CM | POA: Diagnosis not present

## 2018-01-20 DIAGNOSIS — Q210 Ventricular septal defect: Secondary | ICD-10-CM | POA: Insufficient documentation

## 2018-01-20 DIAGNOSIS — I10 Essential (primary) hypertension: Secondary | ICD-10-CM | POA: Diagnosis not present

## 2018-01-20 DIAGNOSIS — Z87891 Personal history of nicotine dependence: Secondary | ICD-10-CM | POA: Insufficient documentation

## 2018-01-20 DIAGNOSIS — B1920 Unspecified viral hepatitis C without hepatic coma: Secondary | ICD-10-CM | POA: Diagnosis not present

## 2018-01-28 DIAGNOSIS — H40013 Open angle with borderline findings, low risk, bilateral: Secondary | ICD-10-CM | POA: Diagnosis not present

## 2018-01-28 DIAGNOSIS — Z8669 Personal history of other diseases of the nervous system and sense organs: Secondary | ICD-10-CM | POA: Diagnosis not present

## 2018-01-28 DIAGNOSIS — H35033 Hypertensive retinopathy, bilateral: Secondary | ICD-10-CM | POA: Diagnosis not present

## 2018-01-28 DIAGNOSIS — H35372 Puckering of macula, left eye: Secondary | ICD-10-CM | POA: Diagnosis not present

## 2018-02-25 ENCOUNTER — Other Ambulatory Visit: Payer: Self-pay | Admitting: Internal Medicine

## 2018-03-24 DIAGNOSIS — D225 Melanocytic nevi of trunk: Secondary | ICD-10-CM | POA: Diagnosis not present

## 2018-03-24 DIAGNOSIS — Z85828 Personal history of other malignant neoplasm of skin: Secondary | ICD-10-CM | POA: Diagnosis not present

## 2018-03-24 DIAGNOSIS — L814 Other melanin hyperpigmentation: Secondary | ICD-10-CM | POA: Diagnosis not present

## 2018-03-24 DIAGNOSIS — L821 Other seborrheic keratosis: Secondary | ICD-10-CM | POA: Diagnosis not present

## 2018-03-24 DIAGNOSIS — L570 Actinic keratosis: Secondary | ICD-10-CM | POA: Diagnosis not present

## 2018-03-24 DIAGNOSIS — Z872 Personal history of diseases of the skin and subcutaneous tissue: Secondary | ICD-10-CM | POA: Diagnosis not present

## 2018-03-30 ENCOUNTER — Ambulatory Visit: Payer: Medicare Other | Admitting: Internal Medicine

## 2018-04-06 ENCOUNTER — Ambulatory Visit (INDEPENDENT_AMBULATORY_CARE_PROVIDER_SITE_OTHER): Payer: Medicare Other | Admitting: Internal Medicine

## 2018-04-06 ENCOUNTER — Encounter: Payer: Self-pay | Admitting: Internal Medicine

## 2018-04-06 DIAGNOSIS — I10 Essential (primary) hypertension: Secondary | ICD-10-CM | POA: Diagnosis not present

## 2018-04-06 DIAGNOSIS — E785 Hyperlipidemia, unspecified: Secondary | ICD-10-CM | POA: Diagnosis not present

## 2018-04-06 DIAGNOSIS — G8929 Other chronic pain: Secondary | ICD-10-CM | POA: Diagnosis not present

## 2018-04-06 DIAGNOSIS — M545 Low back pain: Secondary | ICD-10-CM | POA: Diagnosis not present

## 2018-04-06 DIAGNOSIS — B079 Viral wart, unspecified: Secondary | ICD-10-CM

## 2018-04-06 NOTE — Assessment & Plan Note (Signed)
Advil prn 

## 2018-04-06 NOTE — Assessment & Plan Note (Signed)
Benicar, HCT

## 2018-04-06 NOTE — Progress Notes (Signed)
Subjective:  Patient ID: Roger Kent, male    DOB: 12/08/1945  Age: 72 y.o. MRN: [id number]  CC: No chief complaint on file.   HPI Roger Kent presents for OA, HTN, GERD f/u  Outpatient Medications Prior to Visit  Medication Sig Dispense Refill  . BROMSITE 0.075 % SOLN 1 drop at bedtime.     . cholecalciferol (VITAMIN D) 1000 UNITS tablet Take 1,000 Units by mouth every morning.    Marland Kitchen CRANBERRY PO Take 1 capsule by mouth daily.    Marland Kitchen glucosamine-chondroitin 500-400 MG tablet Take 2 tablets by mouth every morning.     . Homeopathic Products (STRESS/EXHAUSTION RELIEF SL) Take 2 capsules by mouth at bedtime.    . montelukast (SINGULAIR) 10 MG tablet Take 1 tablet (10 mg total) by mouth daily. 90 tablet 1  . Multiple Vitamin (MULTIVITAMIN WITH MINERALS) TABS tablet Take 1 tablet by mouth every morning.    . olmesartan (BENICAR) 40 MG tablet TAKE 1 TABLET DAILY 90 tablet 1  . omeprazole-sodium bicarbonate (ZEGERID) 40-1100 MG per capsule Take 1 capsule by mouth daily before breakfast.    . OVER THE COUNTER MEDICATION Take 3 capsules by mouth every morning. OTC herbal supplement for prostate health    . OVER THE COUNTER MEDICATION Ultimate Bladder Support 1 each by mouth daily.    . hydrochlorothiazide (MICROZIDE) 12.5 MG capsule Take 1 capsule (12.5 mg total) by mouth daily. 90 capsule 3  . olmesartan (BENICAR) 40 MG tablet TAKE 1 TABLET DAILY 90 tablet 1   No facility-administered medications prior to visit.     ROS Review of Systems  Constitutional: Negative for appetite change, fatigue and unexpected weight change.  HENT: Negative for congestion, nosebleeds, sneezing, sore throat and trouble swallowing.   Eyes: Negative for itching and visual disturbance.  Respiratory: Negative for cough.   Cardiovascular: Negative for chest pain, palpitations and leg swelling.  Gastrointestinal: Negative for abdominal distention, blood in stool, diarrhea and nausea.  Genitourinary:  Negative for frequency and hematuria.  Musculoskeletal: Positive for arthralgias and back pain. Negative for gait problem, joint swelling and neck pain.  Skin: Negative for rash.  Neurological: Negative for dizziness, tremors, speech difficulty and weakness.  Psychiatric/Behavioral: Negative for agitation, dysphoric mood, sleep disturbance and suicidal ideas. The patient is not nervous/anxious.     Objective:  BP 128/82 (BP Location: Left Arm, Patient Position: Sitting, Cuff Size: Large)   Pulse 66   Temp 98 F (36.7 C) (Oral)   Ht 6\' 1"  (1.854 m)   Wt 197 lb (89.4 kg)   SpO2 97%   BMI 25.99 kg/m   BP Readings from Last 3 Encounters:  04/06/18 128/82  01/13/18 118/80  09/30/17 128/72    Wt Readings from Last 3 Encounters:  04/06/18 197 lb (89.4 kg)  01/13/18 195 lb (88.5 kg)  09/30/17 191 lb (86.6 kg)    Physical Exam  Constitutional: He is oriented to person, place, and time. He appears well-developed. No distress.  NAD  HENT:  Mouth/Throat: Oropharynx is clear and moist.  Eyes: Pupils are equal, round, and reactive to light. Conjunctivae are normal.  Neck: Normal range of motion. No JVD present. No thyromegaly present.  Cardiovascular: Normal rate, regular rhythm, normal heart sounds and intact distal pulses. Exam reveals no gallop and no friction rub.  No murmur heard. Pulmonary/Chest: Effort normal and breath sounds normal. No respiratory distress. He has no wheezes. He has no rales. He exhibits no tenderness.  Abdominal: Soft.  Bowel sounds are normal. He exhibits no distension and no mass. There is no tenderness. There is no rebound and no guarding.  Musculoskeletal: Normal range of motion. He exhibits tenderness. He exhibits no edema.  Lymphadenopathy:    He has no cervical adenopathy.  Neurological: He is alert and oriented to person, place, and time. He has normal reflexes. No cranial nerve deficit. He exhibits normal muscle tone. He displays a negative Romberg  sign. Coordination and gait normal.  Skin: Skin is warm and dry. No rash noted.  Psychiatric: He has a normal mood and affect. His behavior is normal. Judgment and thought content normal.   Wart R thumb    Procedure Note :     Procedure : Cryosurgery   Indication:  Wart(s)    Risks including unsuccessful procedure , bleeding, infection, bruising, scar, a need for a repeat  procedure and others were explained to the patient in detail as well as the benefits. Informed consent was obtained verbally.   1  lesion(s)  on R thumb  was/were treated with liquid nitrogen on a Q-tip in a usual fasion . Band-Aid was applied and antibiotic ointment was given for a later use.   Tolerated well. Complications none.   Postprocedure instructions :     Keep the wounds clean. You can wash them with liquid soap and water. Pat dry with gauze or a Kleenex tissue  Before applying antibiotic ointment and a Band-Aid.   You need to report immediately  if  any signs of infection develop.    Lab Results  Component Value Date   WBC 8.8 12/26/2016   HGB 15.3 12/26/2016   HCT 45.2 12/26/2016   PLT 238.0 12/26/2016   GLUCOSE 96 12/26/2016   CHOL 158 12/26/2016   TRIG 79.0 12/26/2016   HDL 47.80 12/26/2016   LDLCALC 95 12/26/2016   ALT 18 12/26/2016   AST 20 12/26/2016   NA 138 12/26/2016   K 4.3 12/26/2016   CL 103 12/26/2016   CREATININE 1.16 12/26/2016   BUN 16 12/26/2016   CO2 30 12/26/2016   TSH 2.69 12/26/2016   PSA 8.38 (H) 12/26/2016   INR 2.4 RATIO (H) 09/17/2007   HGBA1C 5.6 12/22/2015    No results found.  Assessment & Plan:   There are no diagnoses linked to this encounter. I am having Roger Kent maintain his cholecalciferol, OVER THE COUNTER MEDICATION, glucosamine-chondroitin, multivitamin with minerals, omeprazole-sodium bicarbonate, CRANBERRY PO, OVER THE COUNTER MEDICATION, hydrochlorothiazide, Homeopathic Products (STRESS/EXHAUSTION RELIEF SL), BROMSITE, montelukast,  and olmesartan.  No orders of the defined types were placed in this encounter.    Follow-up: No follow-ups on file.  Walker Kehr, MD

## 2018-04-06 NOTE — Assessment & Plan Note (Signed)
  On diet  

## 2018-04-06 NOTE — Assessment & Plan Note (Signed)
R thumb See Cryo

## 2018-04-07 DIAGNOSIS — M170 Bilateral primary osteoarthritis of knee: Secondary | ICD-10-CM | POA: Diagnosis not present

## 2018-04-07 DIAGNOSIS — M25561 Pain in right knee: Secondary | ICD-10-CM | POA: Diagnosis not present

## 2018-04-07 DIAGNOSIS — M25562 Pain in left knee: Secondary | ICD-10-CM | POA: Diagnosis not present

## 2018-04-15 DIAGNOSIS — M170 Bilateral primary osteoarthritis of knee: Secondary | ICD-10-CM | POA: Diagnosis not present

## 2018-04-15 DIAGNOSIS — M25561 Pain in right knee: Secondary | ICD-10-CM | POA: Diagnosis not present

## 2018-04-15 DIAGNOSIS — M25562 Pain in left knee: Secondary | ICD-10-CM | POA: Diagnosis not present

## 2018-04-20 DIAGNOSIS — M25561 Pain in right knee: Secondary | ICD-10-CM | POA: Diagnosis not present

## 2018-04-20 DIAGNOSIS — M25562 Pain in left knee: Secondary | ICD-10-CM | POA: Diagnosis not present

## 2018-04-20 DIAGNOSIS — M170 Bilateral primary osteoarthritis of knee: Secondary | ICD-10-CM | POA: Diagnosis not present

## 2018-04-27 DIAGNOSIS — M25561 Pain in right knee: Secondary | ICD-10-CM | POA: Diagnosis not present

## 2018-04-27 DIAGNOSIS — M1711 Unilateral primary osteoarthritis, right knee: Secondary | ICD-10-CM | POA: Diagnosis not present

## 2018-05-05 ENCOUNTER — Encounter (INDEPENDENT_AMBULATORY_CARE_PROVIDER_SITE_OTHER): Payer: Medicare Other | Admitting: Ophthalmology

## 2018-05-05 DIAGNOSIS — H338 Other retinal detachments: Secondary | ICD-10-CM | POA: Diagnosis not present

## 2018-05-05 DIAGNOSIS — M25562 Pain in left knee: Secondary | ICD-10-CM | POA: Diagnosis not present

## 2018-05-05 DIAGNOSIS — H43813 Vitreous degeneration, bilateral: Secondary | ICD-10-CM | POA: Diagnosis not present

## 2018-05-05 DIAGNOSIS — H59031 Cystoid macular edema following cataract surgery, right eye: Secondary | ICD-10-CM | POA: Diagnosis not present

## 2018-05-05 DIAGNOSIS — I10 Essential (primary) hypertension: Secondary | ICD-10-CM | POA: Diagnosis not present

## 2018-05-05 DIAGNOSIS — H35033 Hypertensive retinopathy, bilateral: Secondary | ICD-10-CM | POA: Diagnosis not present

## 2018-05-05 DIAGNOSIS — M25561 Pain in right knee: Secondary | ICD-10-CM | POA: Diagnosis not present

## 2018-05-05 DIAGNOSIS — M170 Bilateral primary osteoarthritis of knee: Secondary | ICD-10-CM | POA: Diagnosis not present

## 2018-06-01 DIAGNOSIS — H04123 Dry eye syndrome of bilateral lacrimal glands: Secondary | ICD-10-CM | POA: Diagnosis not present

## 2018-06-01 DIAGNOSIS — H40013 Open angle with borderline findings, low risk, bilateral: Secondary | ICD-10-CM | POA: Diagnosis not present

## 2018-06-01 DIAGNOSIS — H268 Other specified cataract: Secondary | ICD-10-CM | POA: Diagnosis not present

## 2018-06-01 DIAGNOSIS — H11249 Scarring of conjunctiva, unspecified eye: Secondary | ICD-10-CM | POA: Diagnosis not present

## 2018-06-09 DIAGNOSIS — R972 Elevated prostate specific antigen [PSA]: Secondary | ICD-10-CM | POA: Diagnosis not present

## 2018-06-10 ENCOUNTER — Other Ambulatory Visit (INDEPENDENT_AMBULATORY_CARE_PROVIDER_SITE_OTHER): Payer: Medicare Other

## 2018-06-10 ENCOUNTER — Ambulatory Visit (INDEPENDENT_AMBULATORY_CARE_PROVIDER_SITE_OTHER): Payer: Medicare Other | Admitting: Internal Medicine

## 2018-06-10 ENCOUNTER — Other Ambulatory Visit: Payer: Self-pay | Admitting: Internal Medicine

## 2018-06-10 ENCOUNTER — Telehealth: Payer: Self-pay | Admitting: Internal Medicine

## 2018-06-10 ENCOUNTER — Ambulatory Visit (INDEPENDENT_AMBULATORY_CARE_PROVIDER_SITE_OTHER)
Admission: RE | Admit: 2018-06-10 | Discharge: 2018-06-10 | Disposition: A | Discharge status: Home / Self Care | Payer: Medicare Other | Source: Ambulatory Visit | Attending: Internal Medicine | Admitting: Internal Medicine

## 2018-06-10 ENCOUNTER — Encounter: Payer: Self-pay | Admitting: Internal Medicine

## 2018-06-10 DIAGNOSIS — M79661 Pain in right lower leg: Secondary | ICD-10-CM

## 2018-06-10 DIAGNOSIS — M7989 Other specified soft tissue disorders: Secondary | ICD-10-CM | POA: Diagnosis not present

## 2018-06-10 DIAGNOSIS — M79604 Pain in right leg: Secondary | ICD-10-CM | POA: Diagnosis not present

## 2018-06-10 LAB — BASIC METABOLIC PANEL
BUN: 16 mg/dL (ref 6–23)
CHLORIDE: 105 meq/L (ref 96–112)
CO2: 29 meq/L (ref 19–32)
Calcium: 9.1 mg/dL (ref 8.4–10.5)
Creatinine, Ser: 0.96 mg/dL (ref 0.40–1.50)
GFR: 81.76 mL/min (ref >60.00)
Glucose, Bld: 88 mg/dL (ref 70–99)
POTASSIUM: 4.1 meq/L (ref 3.5–5.1)
SODIUM: 140 meq/L (ref 135–145)

## 2018-06-10 LAB — CBC WITH DIFFERENTIAL/PLATELET
BASOS PCT: 0.7 % (ref 0.0–3.0)
Basophils Absolute: 0.1 10*3/uL (ref 0.0–0.1)
Eosinophils Absolute: 0.2 10*3/uL (ref 0.0–0.7)
Eosinophils Relative: 2.1 % (ref 0.0–5.0)
HEMATOCRIT: 41.2 % (ref 39.0–52.0)
HEMOGLOBIN: 13.9 g/dL (ref 13.0–17.0)
LYMPHS PCT: 22.9 % (ref 12.0–46.0)
Lymphs Abs: 2 10*3/uL (ref 0.7–4.0)
MCHC: 33.7 g/dL (ref 30.0–36.0)
MCV: 92.5 fl (ref 78.0–100.0)
MONOS PCT: 10.8 % (ref 3.0–12.0)
Monocytes Absolute: 0.9 10*3/uL (ref 0.1–1.0)
NEUTROS ABS: 5.5 10*3/uL (ref 1.4–7.7)
Neutrophils Relative %: 63.5 % (ref 43.0–77.0)
PLATELETS: 263 10*3/uL (ref 150.0–400.0)
RBC: 4.46 Mil/uL (ref 4.22–5.81)
RDW: 14.2 % (ref 11.5–15.5)
WBC: 8.6 10*3/uL (ref 4.0–10.5)

## 2018-06-10 LAB — URIC ACID: Uric Acid, Serum: 6.3 mg/dL (ref 4.0–7.8)

## 2018-06-10 LAB — D-DIMER, QUANTITATIVE: D-Dimer, Quant: 5.19 mcg/mL FEU — ABNORMAL HIGH (ref <0.50)

## 2018-06-10 NOTE — Assessment & Plan Note (Signed)
R/o DVT, other X ray D-dimer Duplex US if D dimer is (+)

## 2018-06-10 NOTE — Progress Notes (Signed)
Subjective:  Patient ID: Roger Kent, male    DOB: 10-30-1946  Age: 72 y.o. MRN: [id number]  CC: No chief complaint on file.   HPI Roger Kent presents for R ankle pain x 6 mo. Worse x 10 d ago - started at night; he couldn't step on it due to pain. Better now... He has been to [facility]  - he had platelet infusion in the B knees 2 weeks ago C/o R leg swelling - some pain in the back of the R leg... H/o L LE DVT  Outpatient Medications Prior to Visit  Medication Sig Dispense Refill  . BROMSITE 0.075 % SOLN 1 drop at bedtime.     . cholecalciferol (VITAMIN D) 1000 UNITS tablet Take 1,000 Units by mouth every morning.    Marland Kitchen CRANBERRY PO Take 1 capsule by mouth daily.    Marland Kitchen glucosamine-chondroitin 500-400 MG tablet Take 2 tablets by mouth every morning.     . Homeopathic Products (STRESS/EXHAUSTION RELIEF SL) Take 2 capsules by mouth at bedtime.    . montelukast (SINGULAIR) 10 MG tablet Take 1 tablet (10 mg total) by mouth daily. (Patient taking differently: Take 10 mg by mouth daily as needed. ) 90 tablet 1  . Multiple Vitamin (MULTIVITAMIN WITH MINERALS) TABS tablet Take 1 tablet by mouth every morning.    . olmesartan (BENICAR) 40 MG tablet TAKE 1 TABLET DAILY 90 tablet 1  . omeprazole-sodium bicarbonate (ZEGERID) 40-1100 MG per capsule Take 1 capsule by mouth daily before breakfast.    . OVER THE COUNTER MEDICATION Take 3 capsules by mouth every morning. OTC herbal supplement for prostate health    . OVER THE COUNTER MEDICATION Ultimate Bladder Support 1 each by mouth daily.    . hydrochlorothiazide (MICROZIDE) 12.5 MG capsule Take 1 capsule (12.5 mg total) by mouth daily. 90 capsule 3   No facility-administered medications prior to visit.     ROS: Review of Systems  Constitutional: Negative for appetite change, fatigue and unexpected weight change.  HENT: Negative for congestion, nosebleeds, sneezing, sore throat and trouble swallowing.   Eyes: Negative for  itching and visual disturbance.  Respiratory: Negative for cough.   Cardiovascular: Positive for leg swelling. Negative for chest pain and palpitations.  Gastrointestinal: Negative for abdominal distention, blood in stool, diarrhea and nausea.  Genitourinary: Negative for frequency and hematuria.  Musculoskeletal: Positive for arthralgias, gait problem and joint swelling. Negative for back pain and neck pain.  Skin: Positive for color change. Negative for rash and wound.  Neurological: Negative for dizziness, tremors, speech difficulty and weakness.  Hematological: Negative for adenopathy.  Psychiatric/Behavioral: Negative for agitation, dysphoric mood, sleep disturbance and suicidal ideas. The patient is not nervous/anxious.     Objective:  BP 132/80 (BP Location: Left Arm, Patient Position: Sitting, Cuff Size: Normal)   Pulse 71   Temp 98.2 F (36.8 C) (Oral)   Ht 6\' 1"  (1.854 m)   Wt 189 lb (85.7 kg)   SpO2 93%   BMI 24.94 kg/m   BP Readings from Last 3 Encounters:  06/10/18 132/80  04/06/18 128/82  01/13/18 118/80    Wt Readings from Last 3 Encounters:  06/10/18 189 lb (85.7 kg)  04/06/18 197 lb (89.4 kg)  01/13/18 195 lb (88.5 kg)    Physical Exam  Constitutional: He is oriented to person, place, and time. He appears well-developed. No distress.  NAD  HENT:  Mouth/Throat: Oropharynx is clear and moist.  Eyes: Pupils are equal, round, and  reactive to light. Conjunctivae are normal.  Neck: Normal range of motion. No JVD present. No thyromegaly present.  Cardiovascular: Normal rate, regular rhythm, normal heart sounds and intact distal pulses. Exam reveals no gallop and no friction rub.  No murmur heard. Pulmonary/Chest: Effort normal and breath sounds normal. No respiratory distress. He has no wheezes. He has no rales. He exhibits no tenderness.  Abdominal: Soft. Bowel sounds are normal. He exhibits no distension and no mass. There is no tenderness. There is no rebound  and no guarding.  Musculoskeletal: Normal range of motion. He exhibits edema and tenderness.  Lymphadenopathy:    He has no cervical adenopathy.  Neurological: He is alert and oriented to person, place, and time. He has normal reflexes. No cranial nerve deficit. He exhibits normal muscle tone. He displays a negative Romberg sign. Coordination and gait normal.  Skin: Skin is warm and dry. No rash noted.  Psychiatric: He has a normal mood and affect. His behavior is normal. Judgment and thought content normal.    R foot, ankle and distal shin 1/2 - 1/3 is tender, swollen and purple in color DVT clinical tests (-)  Lab Results  Component Value Date   WBC 8.8 12/26/2016   HGB 15.3 12/26/2016   HCT 45.2 12/26/2016   PLT 238.0 12/26/2016   GLUCOSE 96 12/26/2016   CHOL 158 12/26/2016   TRIG 79.0 12/26/2016   HDL 47.80 12/26/2016   LDLCALC 95 12/26/2016   ALT 18 12/26/2016   AST 20 12/26/2016   NA 138 12/26/2016   K 4.3 12/26/2016   CL 103 12/26/2016   CREATININE 1.16 12/26/2016   BUN 16 12/26/2016   CO2 30 12/26/2016   TSH 2.69 12/26/2016   PSA 8.38 (H) 12/26/2016   INR 2.4 RATIO (H) 09/17/2007   HGBA1C 5.6 12/22/2015    No results found.  Assessment & Plan:   There are no diagnoses linked to this encounter.   No orders of the defined types were placed in this encounter.    Follow-up: No follow-ups on file.  Walker Kehr, MD

## 2018-06-10 NOTE — Patient Instructions (Addendum)
Elevate leg Ankle brace - elastic Start Xarelto if the test is (+) for blood clot (D-dimer)

## 2018-06-10 NOTE — Telephone Encounter (Signed)
I left a VM re: D dimer is elevated. It is suspicious for a blood clot. Please start Xarelto starter pack tonight. I'll order ultrasound. Elevate the leg. Your X ray is ok. Your gout test is ok.  Roger Kent, Please call - make sure he got the message Thanks, AP

## 2018-06-11 NOTE — Telephone Encounter (Signed)
Pt did get message and has appt for [place] tomorrow

## 2018-06-12 ENCOUNTER — Ambulatory Visit (HOSPITAL_COMMUNITY)
Admission: RE | Admit: 2018-06-12 | Discharge: 2018-06-12 | Disposition: A | Discharge status: Home / Self Care | Payer: Medicare Other | Source: Ambulatory Visit | Attending: Vascular Surgery | Admitting: Vascular Surgery

## 2018-06-12 ENCOUNTER — Telehealth: Payer: Self-pay

## 2018-06-12 DIAGNOSIS — M79661 Pain in right lower leg: Secondary | ICD-10-CM | POA: Insufficient documentation

## 2018-06-12 DIAGNOSIS — M7989 Other specified soft tissue disorders: Secondary | ICD-10-CM | POA: Insufficient documentation

## 2018-06-12 NOTE — Telephone Encounter (Signed)
Please make sure he has follow-up with Dr. Alain Marion.

## 2018-06-12 NOTE — Telephone Encounter (Signed)
Please advise in Dr. Enis Slipper absence   Copied from [place] [phone]. Topic: General - Other >> Jun 12, 2018 10:06 AM Yvette Rack wrote: Reason for CRM: Erica with [facility] states pt is negative for DVT and positive for SVT. Pt will follow up with the office. Cb# [phone]

## 2018-06-15 DIAGNOSIS — N401 Enlarged prostate with lower urinary tract symptoms: Secondary | ICD-10-CM | POA: Diagnosis not present

## 2018-06-15 DIAGNOSIS — R351 Nocturia: Secondary | ICD-10-CM | POA: Diagnosis not present

## 2018-06-15 DIAGNOSIS — R972 Elevated prostate specific antigen [PSA]: Secondary | ICD-10-CM | POA: Diagnosis not present

## 2018-06-16 ENCOUNTER — Other Ambulatory Visit: Payer: Self-pay | Admitting: Internal Medicine

## 2018-06-16 ENCOUNTER — Encounter (INDEPENDENT_AMBULATORY_CARE_PROVIDER_SITE_OTHER): Payer: Self-pay

## 2018-06-16 DIAGNOSIS — I82491 Acute embolism and thrombosis of other specified deep vein of right lower extremity: Secondary | ICD-10-CM

## 2018-06-16 MED ORDER — RIVAROXABAN 20 MG PO TABS: 20.0000 mg | ORAL_TABLET | Freq: Every day | ORAL | 5 refills | Status: DC

## 2018-06-23 ENCOUNTER — Telehealth: Payer: Self-pay | Admitting: Internal Medicine

## 2018-06-23 NOTE — Telephone Encounter (Signed)
Referral has been sent to Dr. Antonieta Pert office

## 2018-06-23 NOTE — Telephone Encounter (Signed)
Patients wife came by the office checking on the Hematology referral. She said that they have not heard from them yet.. I see that it is still in the works. I told her that it was being worked on and someone should be in touch soon.

## 2018-07-01 DIAGNOSIS — H903 Sensorineural hearing loss, bilateral: Secondary | ICD-10-CM | POA: Diagnosis not present

## 2018-07-14 ENCOUNTER — Other Ambulatory Visit: Payer: Self-pay | Admitting: Family

## 2018-07-14 DIAGNOSIS — I824Y1 Acute embolism and thrombosis of unspecified deep veins of right proximal lower extremity: Secondary | ICD-10-CM

## 2018-07-15 ENCOUNTER — Encounter: Payer: Self-pay | Admitting: Family

## 2018-07-15 ENCOUNTER — Other Ambulatory Visit: Payer: Self-pay

## 2018-07-15 ENCOUNTER — Inpatient Hospital Stay: Payer: Medicare Other | Attending: Hematology & Oncology | Admitting: Family

## 2018-07-15 ENCOUNTER — Inpatient Hospital Stay: Payer: Medicare Other

## 2018-07-15 VITALS — BP 139/72 | HR 69 | Temp 98.2°F | Resp 20 | Ht 73.25 in | Wt 189.8 lb

## 2018-07-15 DIAGNOSIS — R790 Abnormal level of blood mineral: Secondary | ICD-10-CM | POA: Diagnosis not present

## 2018-07-15 DIAGNOSIS — I82811 Embolism and thrombosis of superficial veins of right lower extremities: Secondary | ICD-10-CM

## 2018-07-15 DIAGNOSIS — Z7901 Long term (current) use of anticoagulants: Secondary | ICD-10-CM | POA: Insufficient documentation

## 2018-07-15 DIAGNOSIS — I824Y1 Acute embolism and thrombosis of unspecified deep veins of right proximal lower extremity: Secondary | ICD-10-CM

## 2018-07-15 DIAGNOSIS — R791 Abnormal coagulation profile: Secondary | ICD-10-CM | POA: Insufficient documentation

## 2018-07-15 DIAGNOSIS — Z86718 Personal history of other venous thrombosis and embolism: Secondary | ICD-10-CM | POA: Diagnosis not present

## 2018-07-15 LAB — CMP (CANCER CENTER ONLY)
ALBUMIN: 4.4 g/dL (ref 3.5–5.0)
ALT: 19 U/L (ref 0–44)
AST: 23 U/L (ref 15–41)
Alkaline Phosphatase: 62 U/L (ref 38–126)
Anion gap: 7 (ref 5–15)
BUN: 15 mg/dL (ref 8–23)
CHLORIDE: 104 mmol/L (ref 98–111)
CO2: 29 mmol/L (ref 22–32)
Calcium: 9.5 mg/dL (ref 8.9–10.3)
Creatinine: 1.1 mg/dL (ref 0.61–1.24)
GFR, Est AFR Am: >60 mL/min (ref >60)
GFR, Estimated: >60 mL/min (ref >60)
GLUCOSE: 92 mg/dL (ref 70–99)
POTASSIUM: 4.5 mmol/L (ref 3.5–5.1)
SODIUM: 140 mmol/L (ref 135–145)
Total Bilirubin: 0.7 mg/dL (ref 0.3–1.2)
Total Protein: 8.1 g/dL (ref 6.5–8.1)

## 2018-07-15 LAB — SAVE SMEAR

## 2018-07-15 LAB — CBC WITH DIFFERENTIAL (CANCER CENTER ONLY)
BASOS ABS: 0 10*3/uL (ref 0.0–0.1)
BASOS PCT: 0 %
EOS ABS: 0.1 10*3/uL (ref 0.0–0.5)
Eosinophils Relative: 1 %
HCT: 45.1 % (ref 38.7–49.9)
Hemoglobin: 14.8 g/dL (ref 13.0–17.1)
Lymphocytes Relative: 31 %
Lymphs Abs: 2.3 10*3/uL (ref 0.9–3.3)
MCH: 31.2 pg (ref 28.0–33.4)
MCHC: 32.8 g/dL (ref 32.0–35.9)
MCV: 95.1 fL (ref 82.0–98.0)
MONO ABS: 0.8 10*3/uL (ref 0.1–0.9)
Monocytes Relative: 11 %
Neutro Abs: 4.3 10*3/uL (ref 1.5–6.5)
Neutrophils Relative %: 57 %
PLATELETS: 229 10*3/uL (ref 145–400)
RBC: 4.74 MIL/uL (ref 4.20–5.70)
RDW: 14.5 % (ref 11.1–15.7)
WBC Count: 7.6 10*3/uL (ref 4.0–10.0)

## 2018-07-15 LAB — LACTATE DEHYDROGENASE: LDH: 231 U/L — ABNORMAL HIGH (ref 98–192)

## 2018-07-15 LAB — ANTITHROMBIN III: ANTITHROMB III FUNC: 85 % (ref 75–120)

## 2018-07-15 NOTE — Progress Notes (Signed)
Hematology/Oncology Consultation   Name: Roger Kent      MRN: [id number]    Location: Room/bed info not found  Date: 07/15/2018 Time:1:30 PM   REFERRING PHYSICIAN: Cherene Julian, MD  REASON FOR CONSULT: Acute DVT of other specified vein of right lower extremity   DIAGNOSIS: SVT of the right greater saphenous vein  HISTORY OF PRESENT ILLNESS: Roger Kent is a pleasant 72 yo caucasian gentleman with history of DVT of the left lower extremity several years ago treated with 6 months of Coumadin. He now has a superficial thrombus of the right greater saphenous vein. He is doing well on Xarelto and has had no episodes of bleeding or bruising.  He work up one morning in early July of this year with right leg pain and mild swelling. He waited 2 weeks before going to see his PCP where [place] verified SVT. He was not on aspirin at the time.  He states that a week prior to his symptoms starting he had had platelets injected into both knees Right lower extremity swelling has gone down and his pain has improved.  He has chronic swelling, without pain or pitting edema, of the left lower extremity. Pedal pulses are +2.  He wears the copper fit compression stockings on both lower extremities regularly.  He is retired from [employer]. 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  He is not smoker and does not use testosterone supplementation.  He states that on his mothers side they had varicose veins and he states that his mother also had history of thrombus.  No history with his mother, sister or daughter of miscarriage.  He is followed by cardiology for Ventriclar Septal Defect.  No issue with infections. No fever, chills, n/v, cough, rash, dizziness, SOB, chest pain, palpitations, abdominal pain or changes in bowel or bladder habits.  He has an  elevated PSA (8.38 in February 2019) and BPH. He has his PSA checked twice a year and sees his urologist annually.  He has had his colonoscopy which he states was negative.  He has a good appetite but admits that he needs to stay better hydrated.  He is quite active and goes to the gym regularly to swim and do cardio.   ROS: All other 10 point review of systems is negative.   PAST MEDICAL HISTORY:   Past Medical History:  Diagnosis Date  . Acute hepatitis C without mention of hepatic coma(070.51)   . Allergic rhinitis, cause unspecified   . Anxiety state, unspecified   . Barrett's esophagus   . BPH (benign prostatic hyperplasia)   . DVT (deep venous thrombosis) ([place])   . Elevated PSA    Dr Karsten Ro  . Esophageal reflux   . Hiatal hernia   . Hyperlipidemia   . Hypertension   . Hypertonicity of bladder   . Insomnia   . Osteoporosis, unspecified   . Personal history of urinary calculi   . Recent retinal detachment, partial, with giant tear   . Unspecified tinnitus   . Ventricular septal defect     ALLERGIES: Allergies  Allergen Reactions  . Oxycodone-Acetaminophen Other (See Comments)    Other Reaction: agitation  . Lamisil [Terbinafine] Rash      MEDICATIONS:  Current Outpatient Medications on File Prior to Visit  Medication Sig Dispense Refill  . BROMSITE 0.075 % SOLN 1 drop at bedtime. 07/15/2018 Both eyes    . cholecalciferol (VITAMIN D) 1000 UNITS tablet Take 1,000 Units by mouth every morning.    Marland Kitchen CRANBERRY PO Take 1 capsule by mouth daily.    Marland Kitchen glucosamine-chondroitin 500-400 MG tablet Take 2 tablets by mouth every morning.     . Homeopathic Products (STRESS/EXHAUSTION RELIEF SL) Take 2 capsules by mouth at bedtime.    . Lactobacillus (CVS ACIDOPHILUS PO) Take by mouth daily.    . mirabegron ER (MYRBETRIQ) 25 MG TB24 tablet Take 25 mg by mouth daily.    . montelukast (SINGULAIR) 10 MG tablet Take 1 tablet (10 mg total) by mouth daily. (Patient taking differently:  Take 10 mg by mouth daily as needed. ) 90 tablet 1  . Multiple Vitamin (MULTIVITAMIN WITH MINERALS) TABS tablet Take 1 tablet by mouth every morning.    . Nutritional Supplements (QUINOA KALE & HEMP) LIQD Take by mouth daily.    Marland Kitchen olmesartan (BENICAR) 40 MG tablet TAKE 1 TABLET DAILY 90 tablet 1  . omeprazole-sodium bicarbonate (ZEGERID) 40-1100 MG per capsule Take 1 capsule by mouth daily before breakfast.    . OVER THE COUNTER MEDICATION Take 3 capsules by mouth every morning. OTC herbal supplement for prostate health    . OVER THE COUNTER MEDICATION Ultimate Bladder Support 1 each by mouth daily.    Marland Kitchen OVER THE COUNTER MEDICATION Lipobiotol daily    . prednisoLONE acetate (PREDNISOLONE ACETATE P-F) 1 % ophthalmic suspension Place 1 drop into the right eye 2 (two) times daily.    . rivaroxaban (XARELTO) 20 MG TABS tablet Take 1 tablet (20 mg total) by mouth daily. 30 tablet 5   No current facility-administered medications on file prior to visit.      PAST SURGICAL HISTORY Past Surgical History:  Procedure Laterality Date  . KNEE SURGERY    . RETINAL DETACHMENT SURGERY      FAMILY HISTORY: Family History  Problem Relation Age of Onset  . Hypertension Other   . Alcohol abuse Neg Hx     SOCIAL HISTORY:  reports that he has quit smoking. He has never used smokeless tobacco. He reports that he does not drink alcohol or use drugs.  PERFORMANCE STATUS: The patient's performance status is 1 - Symptomatic but completely ambulatory  PHYSICAL EXAM: Most Recent Vital Signs: Blood pressure 139/72, pulse 69, temperature 98.2 F (36.8 C), temperature source Oral, resp. rate 20, height 6' 1.25" (1.861 m), weight 189 lb 12.8 oz (86.1 kg), SpO2 98 %. BP 139/72 (BP Location: Right Arm, Patient Position: Sitting)   Pulse 69   Temp 98.2 F (36.8 C) (Oral)   Resp 20   Ht 6' 1.25" (1.861 m)   Wt 189 lb 12.8 oz (86.1 kg)   SpO2 98%   BMI 24.87 kg/m   General Appearance:    Alert,  cooperative, no distress, appears stated age  Head:    Normocephalic, without obvious abnormality, atraumatic  Eyes:    PERRL, conjunctiva/corneas clear, EOM's intact, fundi    benign, both eyes             Throat:   Lips, mucosa, and tongue normal; teeth and gums normal  Neck:   Supple, symmetrical, trachea midline, no adenopathy;       thyroid:  No enlargement/tenderness/nodules; no carotid   bruit or JVD  Back:     Symmetric, no curvature, ROM normal, no CVA tenderness  Lungs:     Clear to auscultation bilaterally, respirations unlabored  Chest wall:    No tenderness or deformity  Heart:    Regular rate and rhythm, S1 and S2 normal, no murmur, rub   or gallop  Abdomen:     Soft, non-tender, bowel sounds active all four quadrants,    no masses, no organomegaly        Extremities:   Extremities normal, atraumatic, no cyanosis or edema  Pulses:   2+ and symmetric all extremities  Skin:   Skin color, texture, turgor normal, no rashes or lesions  Lymph nodes:   Cervical, supraclavicular, and axillary nodes normal  Neurologic:   CNII-XII intact. Normal strength, sensation and reflexes      throughout    LABORATORY DATA:  Results for orders placed or performed in visit on 07/15/18 (from the past 48 hour(s))  CBC with Differential ([place] Only)     Status: None   Collection Time: 07/15/18 10:18 AM  Result Value Ref Range   WBC Count 7.6 4.0 - 10.0 K/uL   RBC 4.74 4.20 - 5.70 MIL/uL   Hemoglobin 14.8 13.0 - 17.1 g/dL   HCT 45.1 38.7 - 49.9 %   MCV 95.1 82.0 - 98.0 fL   MCH 31.2 28.0 - 33.4 pg   MCHC 32.8 32.0 - 35.9 g/dL   RDW 14.5 11.1 - 15.7 %   Platelet Count 229 145 - 400 K/uL   Neutrophils Relative % 57 %   Neutro Abs 4.3 1.5 - 6.5 K/uL   Lymphocytes Relative 31 %   Lymphs Abs 2.3 0.9 - 3.3 K/uL   Monocytes Relative 11 %   Monocytes Absolute 0.8 0.1 - 0.9 K/uL   Eosinophils Relative 1 %   Eosinophils Absolute 0.1 0.0 - 0.5 K/uL   Basophils Relative 0 %    Basophils Absolute 0.0 0.0 - 0.1 K/uL    Comment: Performed at [facility], [address], [place], [place] [address]  [place] ([place] only)     Status: None   Collection Time: 07/15/18 10:18 AM  Result Value Ref Range   Sodium 140 135 - 145 mmol/L   Potassium 4.5 3.5 - 5.1 mmol/L   Chloride 104 98 - 111 mmol/L   CO2 29 22 - 32 mmol/L   Glucose, Bld 92 70 - 99 mg/dL   BUN 15 8 - 23 mg/dL   Creatinine 1.10 0.61 - 1.24 mg/dL   Calcium 9.5 8.9 - 10.3 mg/dL   Total Protein 8.1 6.5 - 8.1 g/dL   Albumin 4.4 3.5 - 5.0 g/dL   AST 23 15 - 41 U/L   ALT 19 0 - 44 U/L   Alkaline Phosphatase 62 38 - 126 U/L   Total Bilirubin 0.7 0.3 - 1.2 mg/dL   GFR, Est Non Af Am >60 >60 mL/min   GFR, Est AFR Am >60 >60 mL/min    Comment: (NOTE) The eGFR has been calculated using the CKD EPI equation. This calculation has not been validated in all clinical situations. eGFR's persistently <60 mL/min signify possible Chronic Kidney Disease.    Anion gap 7 5 - 15    Comment: Performed at [facility], [address], [place], [place] [address]  Save smear     Status: None   Collection Time: 07/15/18 10:18 AM  Result Value Ref Range   Smear Review SMEAR STAINED AND AVAILABLE FOR REVIEW     Comment: Performed  at [facility], [address], [place], [place] [address]      RADIOGRAPHY: No results found.     PATHOLOGY: None   ASSESSMENT/PLAN: Mr. Eisenbeis is a pleasant 72 yo caucasian gentleman with history of DVT of the left lower extremity several years ago treated with 6 months of Coumadin. He now has a superficial thrombus of the right greater saphenous vein. He is doing well on Xarelto and has no complaints at this time. He will continue his same regimen.  We will see what his hypercoag panel shows and if there is any underlying genetic reason for him to clot.  We will go ahead and plan to see him back the first week of  November and will plan to repeat his [place] that same day.   All questions were answered and he is in agreement with the plan. He will contact our office with any questions or concerns. We can certainly see him sooner if need be.   He was discussed with and also seen by Dr. Marin Olp and he is in agreement with the aforementioned.   Laverna Peace     Addendum: I saw and examined the patient with Judson Roch.  I agree with the above assessment.  He has a superficial thrombus.  I would be shocked if his hypercoagulable panel shows [place] anything.  Of note, he does have a high homocystine level.  Because of this, I will put him on folic acid.  There is no family history as far as I can tell.  I suppose there might be something with his mother.  I would keep him on Xarelto for right now.  I think this is reasonable.  I would like to see him back and when we see him back, we will do a Doppler of the right leg.  He was actually fun to talk to.  He is originally from [place].  He actually knows the town where my sister-in-law is from.  He is pretty interesting.  We spent about 45 minutes with he and his wife.  All the time was spent face-to-face.  We counseled him.  We answered all their questions.  We help coordinate his future follow-up care.  Lattie Haw, MD

## 2018-07-16 LAB — CARDIOLIPIN ANTIBODIES, IGG, IGM, IGA
Anticardiolipin IgA: <9 APL U/mL (ref 0–11)
Anticardiolipin IgM: <9 MPL U/mL (ref 0–12)

## 2018-07-16 LAB — PROTEIN S, TOTAL: PROTEIN S AG TOTAL: 71 % (ref 60–150)

## 2018-07-16 LAB — BETA-2-GLYCOPROTEIN I ABS, IGG/M/A: Beta-2-Glycoprotein I IgM: <9 GPI IgM units (ref 0–32)

## 2018-07-16 LAB — IRON AND TIBC
Iron: 110 ug/dL (ref 42–163)
SATURATION RATIOS: 39 % — AB (ref 42–163)
TIBC: 283 ug/dL (ref 202–409)
UIBC: 173 ug/dL

## 2018-07-16 LAB — PROTEIN S ACTIVITY: Protein S Activity: 145 % — ABNORMAL HIGH (ref 63–140)

## 2018-07-16 LAB — PROTEIN C ACTIVITY: Protein C Activity: 135 % (ref 73–180)

## 2018-07-16 LAB — FERRITIN: Ferritin: 145 ng/mL (ref 24–336)

## 2018-07-16 LAB — HOMOCYSTEINE: HOMOCYSTEINE-NORM: 19 umol/L — AB (ref 0.0–15.0)

## 2018-07-16 MED ORDER — FOLIC ACID 1 MG PO TABS: 2.0000 mg | ORAL_TABLET | Freq: Every day | ORAL | 12 refills | Status: DC

## 2018-07-17 LAB — DRVVT MIX: dRVVT Mix: 73.4 s — ABNORMAL HIGH (ref 0.0–47.0)

## 2018-07-17 LAB — LUPUS ANTICOAGULANT PANEL
DRVVT: 119.8 s — ABNORMAL HIGH (ref 0.0–47.0)
PTT Lupus Anticoagulant: 41.4 s (ref 0.0–51.9)

## 2018-07-17 LAB — PROTEIN C, TOTAL: PROTEIN C, TOTAL: 117 % (ref 60–150)

## 2018-07-17 LAB — DRVVT CONFIRM: DRVVT CONFIRM: 2.1 ratio — AB (ref 0.8–1.2)

## 2018-07-21 LAB — FACTOR 5 LEIDEN

## 2018-07-22 LAB — PROTHROMBIN GENE MUTATION

## 2018-07-23 ENCOUNTER — Other Ambulatory Visit: Payer: Self-pay | Admitting: Family

## 2018-07-23 ENCOUNTER — Telehealth: Payer: Self-pay | Admitting: Family

## 2018-07-23 DIAGNOSIS — I824Y1 Acute embolism and thrombosis of unspecified deep veins of right proximal lower extremity: Secondary | ICD-10-CM

## 2018-07-23 DIAGNOSIS — E7211 Homocystinuria: Secondary | ICD-10-CM

## 2018-07-23 DIAGNOSIS — R7989 Other specified abnormal findings of blood chemistry: Secondary | ICD-10-CM

## 2018-07-23 NOTE — Telephone Encounter (Signed)
I spoke with [name] and went over his positive lupus anticoagulant and elevated homocystine level results. Her verbalized understanding and has started taking her folic acid.

## 2018-08-05 ENCOUNTER — Encounter (INDEPENDENT_AMBULATORY_CARE_PROVIDER_SITE_OTHER): Payer: Medicare Other | Admitting: Ophthalmology

## 2018-08-05 DIAGNOSIS — H43813 Vitreous degeneration, bilateral: Secondary | ICD-10-CM | POA: Diagnosis not present

## 2018-08-05 DIAGNOSIS — H59031 Cystoid macular edema following cataract surgery, right eye: Secondary | ICD-10-CM

## 2018-08-05 DIAGNOSIS — I10 Essential (primary) hypertension: Secondary | ICD-10-CM

## 2018-08-05 DIAGNOSIS — H338 Other retinal detachments: Secondary | ICD-10-CM | POA: Diagnosis not present

## 2018-08-10 ENCOUNTER — Ambulatory Visit: Payer: Medicare Other | Admitting: Internal Medicine

## 2018-08-12 DIAGNOSIS — M25562 Pain in left knee: Secondary | ICD-10-CM | POA: Diagnosis not present

## 2018-08-12 DIAGNOSIS — M170 Bilateral primary osteoarthritis of knee: Secondary | ICD-10-CM | POA: Diagnosis not present

## 2018-08-12 DIAGNOSIS — Z9229 Personal history of other drug therapy: Secondary | ICD-10-CM | POA: Diagnosis not present

## 2018-08-12 DIAGNOSIS — M1712 Unilateral primary osteoarthritis, left knee: Secondary | ICD-10-CM | POA: Diagnosis not present

## 2018-08-12 DIAGNOSIS — M25561 Pain in right knee: Secondary | ICD-10-CM | POA: Diagnosis not present

## 2018-08-17 DIAGNOSIS — M25562 Pain in left knee: Secondary | ICD-10-CM | POA: Diagnosis not present

## 2018-08-17 DIAGNOSIS — M1712 Unilateral primary osteoarthritis, left knee: Secondary | ICD-10-CM | POA: Diagnosis not present

## 2018-08-24 ENCOUNTER — Ambulatory Visit (INDEPENDENT_AMBULATORY_CARE_PROVIDER_SITE_OTHER): Payer: Medicare Other | Admitting: Internal Medicine

## 2018-08-24 ENCOUNTER — Other Ambulatory Visit (INDEPENDENT_AMBULATORY_CARE_PROVIDER_SITE_OTHER): Payer: Medicare Other

## 2018-08-24 ENCOUNTER — Encounter: Payer: Self-pay | Admitting: Internal Medicine

## 2018-08-24 VITALS — BP 138/84 | HR 67 | Temp 98.2°F | Ht 73.25 in | Wt 193.0 lb

## 2018-08-24 DIAGNOSIS — Z0000 Encounter for general adult medical examination without abnormal findings: Secondary | ICD-10-CM

## 2018-08-24 DIAGNOSIS — E785 Hyperlipidemia, unspecified: Secondary | ICD-10-CM | POA: Diagnosis not present

## 2018-08-24 DIAGNOSIS — M25512 Pain in left shoulder: Secondary | ICD-10-CM | POA: Diagnosis not present

## 2018-08-24 DIAGNOSIS — N320 Bladder-neck obstruction: Secondary | ICD-10-CM

## 2018-08-24 DIAGNOSIS — Z23 Encounter for immunization: Secondary | ICD-10-CM | POA: Diagnosis not present

## 2018-08-24 LAB — TSH: TSH: 2.95 u[IU]/mL (ref 0.35–4.50)

## 2018-08-24 LAB — CBC WITH DIFFERENTIAL/PLATELET
BASOS ABS: 0 10*3/uL (ref 0.0–0.1)
Basophils Relative: 0.4 % (ref 0.0–3.0)
EOS ABS: 0.1 10*3/uL (ref 0.0–0.7)
Eosinophils Relative: 1.5 % (ref 0.0–5.0)
HCT: 43.4 % (ref 39.0–52.0)
Hemoglobin: 14.7 g/dL (ref 13.0–17.0)
LYMPHS ABS: 2.2 10*3/uL (ref 0.7–4.0)
LYMPHS PCT: 26.5 % (ref 12.0–46.0)
MCHC: 33.8 g/dL (ref 30.0–36.0)
MCV: 91.7 fl (ref 78.0–100.0)
Monocytes Absolute: 0.9 10*3/uL (ref 0.1–1.0)
Monocytes Relative: 10.4 % (ref 3.0–12.0)
NEUTROS ABS: 5 10*3/uL (ref 1.4–7.7)
NEUTROS PCT: 61.2 % (ref 43.0–77.0)
PLATELETS: 212 10*3/uL (ref 150.0–400.0)
RBC: 4.73 Mil/uL (ref 4.22–5.81)
RDW: 14.4 % (ref 11.5–15.5)
WBC: 8.2 10*3/uL (ref 4.0–10.5)

## 2018-08-24 LAB — HEPATIC FUNCTION PANEL
ALBUMIN: 4.3 g/dL (ref 3.5–5.2)
ALK PHOS: 56 U/L (ref 39–117)
ALT: 16 U/L (ref 0–53)
AST: 22 U/L (ref 0–37)
Bilirubin, Direct: 0.1 mg/dL (ref 0.0–0.3)
Total Bilirubin: 0.6 mg/dL (ref 0.2–1.2)
Total Protein: 7.3 g/dL (ref 6.0–8.3)

## 2018-08-24 LAB — LIPID PANEL
CHOL/HDL RATIO: 3
CHOLESTEROL: 146 mg/dL (ref 0–200)
HDL: 43.7 mg/dL (ref >39.00)
LDL Cholesterol: 81 mg/dL (ref 0–99)
NonHDL: 102.67
TRIGLYCERIDES: 109 mg/dL (ref 0.0–149.0)
VLDL: 21.8 mg/dL (ref 0.0–40.0)

## 2018-08-24 LAB — URINALYSIS
BILIRUBIN URINE: NEGATIVE
HGB URINE DIPSTICK: NEGATIVE
KETONES UR: NEGATIVE
LEUKOCYTES UA: NEGATIVE
NITRITE: NEGATIVE
Specific Gravity, Urine: <=1.005 — AB (ref 1.000–1.030)
Total Protein, Urine: NEGATIVE
UROBILINOGEN UA: 0.2 (ref 0.0–1.0)
Urine Glucose: NEGATIVE
pH: 7 (ref 5.0–8.0)

## 2018-08-24 LAB — BASIC METABOLIC PANEL
BUN: 16 mg/dL (ref 6–23)
CALCIUM: 9.2 mg/dL (ref 8.4–10.5)
CO2: 27 mEq/L (ref 19–32)
CREATININE: 1.01 mg/dL (ref 0.40–1.50)
Chloride: 104 mEq/L (ref 96–112)
GFR: 77.06 mL/min (ref >60.00)
Glucose, Bld: 98 mg/dL (ref 70–99)
Potassium: 4.2 mEq/L (ref 3.5–5.1)
SODIUM: 138 meq/L (ref 135–145)

## 2018-08-24 LAB — PSA: PSA: 9.86 ng/mL — ABNORMAL HIGH (ref 0.10–4.00)

## 2018-08-24 MED ORDER — OLMESARTAN MEDOXOMIL 40 MG PO TABS: 40.0000 mg | ORAL_TABLET | Freq: Every day | ORAL | 3 refills | Status: DC

## 2018-08-24 MED ORDER — METHYLPREDNISOLONE ACETATE 80 MG/ML IJ SUSP
80.0000 mg | Freq: Once | INTRAMUSCULAR | Status: AC
  Administered 2018-08-24: 80 mg via INTRA_ARTICULAR

## 2018-08-24 NOTE — Progress Notes (Signed)
Subjective:  Patient ID: Roger Kent, male    DOB: 04-24-1946  Age: 72 y.o. MRN: [id number]  CC: No chief complaint on file.   HPI Roger Kent presents for a well exam C/o L trap pain - L handed F/u DVT, BPH   Outpatient Medications Prior to Visit  Medication Sig Dispense Refill  . BROMSITE 0.075 % SOLN 1 drop at bedtime. 07/15/2018 Both eyes    . cholecalciferol (VITAMIN D) 1000 UNITS tablet Take 1,000 Units by mouth every morning.    Marland Kitchen CRANBERRY PO Take 1 capsule by mouth daily.    . Cyanocobalamin (VITAMIN B 12 PO) Take by mouth.    . folic acid (FOLVITE) 1 MG tablet Take 2 tablets (2 mg total) by mouth daily. 60 tablet 12  . glucosamine-chondroitin 500-400 MG tablet Take 2 tablets by mouth every morning.     . Homeopathic Products (STRESS/EXHAUSTION RELIEF SL) Take 2 capsules by mouth at bedtime.    . Lactobacillus (CVS ACIDOPHILUS PO) Take by mouth daily.    . mirabegron ER (MYRBETRIQ) 25 MG TB24 tablet Take 25 mg by mouth daily.    . montelukast (SINGULAIR) 10 MG tablet Take 1 tablet (10 mg total) by mouth daily. (Patient taking differently: Take 10 mg by mouth daily as needed. ) 90 tablet 1  . Multiple Vitamin (MULTIVITAMIN WITH MINERALS) TABS tablet Take 1 tablet by mouth every morning.    . Nutritional Supplements (QUINOA KALE & HEMP) LIQD Take by mouth daily.    Marland Kitchen olmesartan (BENICAR) 40 MG tablet TAKE 1 TABLET DAILY 90 tablet 1  . omeprazole-sodium bicarbonate (ZEGERID) 40-1100 MG per capsule Take 1 capsule by mouth daily before breakfast.    . OVER THE COUNTER MEDICATION Take 3 capsules by mouth every morning. OTC herbal supplement for prostate health    . OVER THE COUNTER MEDICATION Ultimate Bladder Support 1 each by mouth daily.    Marland Kitchen OVER THE COUNTER MEDICATION Lipobiotol daily    . prednisoLONE acetate (PREDNISOLONE ACETATE P-F) 1 % ophthalmic suspension Place 1 drop into the right eye 2 (two) times daily.    . rivaroxaban (XARELTO) 20 MG TABS tablet  Take 1 tablet (20 mg total) by mouth daily. 30 tablet 5   No facility-administered medications prior to visit.     ROS: Review of Systems  Constitutional: Negative for appetite change, fatigue and unexpected weight change.  HENT: Negative for congestion, nosebleeds, sneezing, sore throat and trouble swallowing.   Eyes: Negative for itching and visual disturbance.  Respiratory: Negative for cough.   Cardiovascular: Negative for chest pain, palpitations and leg swelling.  Gastrointestinal: Negative for abdominal distention, blood in stool, diarrhea and nausea.  Genitourinary: Negative for frequency and hematuria.  Musculoskeletal: Positive for neck pain and neck stiffness. Negative for back pain, gait problem and joint swelling.  Skin: Negative for rash.  Neurological: Negative for dizziness, tremors, speech difficulty and weakness.  Psychiatric/Behavioral: Negative for agitation, dysphoric mood and sleep disturbance. The patient is not nervous/anxious.     Objective:  BP 138/84 (BP Location: Left Arm, Patient Position: Sitting, Cuff Size: Normal)   Pulse 67   Temp 98.2 F (36.8 C) (Oral)   Ht 6' 1.25" (1.861 m)   Wt 193 lb (87.5 kg)   SpO2 96%   BMI 25.29 kg/m   BP Readings from Last 3 Encounters:  08/24/18 138/84  07/15/18 139/72  06/10/18 132/80    Wt Readings from Last 3 Encounters:  08/24/18 193 lb (  87.5 kg)  07/15/18 189 lb 12.8 oz (86.1 kg)  06/10/18 189 lb (85.7 kg)    Physical Exam  Constitutional: He is oriented to person, place, and time. He appears well-developed. No distress.  NAD  HENT:  Mouth/Throat: Oropharynx is clear and moist.  Eyes: Pupils are equal, round, and reactive to light. Conjunctivae are normal.  Neck: Normal range of motion. No JVD present. No thyromegaly present.  Cardiovascular: Normal rate, regular rhythm, normal heart sounds and intact distal pulses. Exam reveals no gallop and no friction rub.  No murmur heard. Pulmonary/Chest:  Effort normal and breath sounds normal. No respiratory distress. He has no wheezes. He has no rales. He exhibits no tenderness.  Abdominal: Soft. Bowel sounds are normal. He exhibits no distension and no mass. There is no tenderness. There is no rebound and no guarding.  Musculoskeletal: Normal range of motion. He exhibits tenderness. He exhibits no edema.  Lymphadenopathy:    He has no cervical adenopathy.  Neurological: He is alert and oriented to person, place, and time. He has normal reflexes. No cranial nerve deficit. He exhibits normal muscle tone. He displays a negative Romberg sign. Coordination and gait normal.  Skin: Skin is warm and dry. No rash noted.  Psychiatric: He has a normal mood and affect. His behavior is normal. Judgment and thought content normal.   L trap - tender   Procedure Note :    Trigger Point Injection: 4 inj   Indication : Focal tender area identifiable by the location without other identifiable neurologic or musculoskeletal finding or pathology.   Risks including unsuccessful procedure , bleeding, infection, bruising, skin atrophy and others were explained to the patient in detail as well as the benefits. Informed consent was obtained and signed.   Tthe patient was placed in a comfortable position. 4   points of maximum tenderness over paraspinal and trapezius muscles were marked and  the skin was prepped with Betadine and alcohol. 1 inch 25-gauge needle was used. The needle was advanced perpendicular to the skin. Each trigger point was injected with 1 mL of 2% lidocaine and 20 mg of Depo-Medrol in a usual fashion.  Band-Aids applied.   Tolerated well. Complications: None. Good pain relief following the procedure.      Lab Results  Component Value Date   WBC 7.6 07/15/2018   HGB 14.8 07/15/2018   HCT 45.1 07/15/2018   PLT 229 07/15/2018   GLUCOSE 92 07/15/2018   CHOL 158 12/26/2016   TRIG 79.0 12/26/2016   HDL 47.80 12/26/2016   LDLCALC 95  12/26/2016   ALT 19 07/15/2018   AST 23 07/15/2018   NA 140 07/15/2018   K 4.5 07/15/2018   CL 104 07/15/2018   CREATININE 1.10 07/15/2018   BUN 15 07/15/2018   CO2 29 07/15/2018   TSH 2.69 12/26/2016   PSA 8.38 (H) 12/26/2016   INR 2.4 RATIO (H) 09/17/2007   HGBA1C 5.6 12/22/2015    No results found.  Assessment & Plan:   There are no diagnoses linked to this encounter.   No orders of the defined types were placed in this encounter.    Follow-up: No follow-ups on file.  Walker Kehr, MD

## 2018-08-24 NOTE — Patient Instructions (Signed)

## 2018-08-24 NOTE — Assessment & Plan Note (Signed)
  Here for medicare wellness/physical  Diet: heart healthy  Physical activity: not sedentary - very ative Depression/mood screen: negative  Hearing: decreased to whispered voice  Visual acuity: grossly normal, performs frequent eye exams due to MD - Dr Zigmund Daniel  ADLs: capable  Fall risk: low to none  Home safety: good  Cognitive evaluation: intact to orientation, naming, recall and repetition  EOL planning: adv directives, full code/ I agree  I have personally reviewed and have noted  1. The patient's medical, surgical and social history  2. Their use of alcohol, tobacco or illicit drugs  3. Their current medications and supplements  4. The patient's functional ability including ADL's, fall risks, home safety risks and hearing or visual impairment.  5. Diet and physical activities  6. Evidence for depression or mood disorders 7. The roster of all physicians providing medical care to patient - is listed in the Snapshot section of the chart and reviewed today.    Today patient counseled on age appropriate routine health concerns for screening and prevention, each reviewed and up to date or declined. Immunizations reviewed and up to date or declined. Labs ordered and reviewed. Risk factors for depression reviewed and negative. Hearing function and visual acuity are intact. ADLs screened and addressed as needed. Functional ability and level of safety reviewed and appropriate. Education, counseling and referrals performed based on assessed risks today. Patient provided with a copy of personalized plan for preventive services.   Dr Earlean Shawl: colon is due in 2024 Rectal per Dr Karsten Ro every year

## 2018-09-01 DIAGNOSIS — M25561 Pain in right knee: Secondary | ICD-10-CM | POA: Diagnosis not present

## 2018-09-01 DIAGNOSIS — M1711 Unilateral primary osteoarthritis, right knee: Secondary | ICD-10-CM | POA: Diagnosis not present

## 2018-09-21 ENCOUNTER — Encounter: Payer: Self-pay | Admitting: Hematology & Oncology

## 2018-09-21 ENCOUNTER — Other Ambulatory Visit: Payer: Self-pay

## 2018-09-21 ENCOUNTER — Ambulatory Visit (HOSPITAL_BASED_OUTPATIENT_CLINIC_OR_DEPARTMENT_OTHER)
Admission: RE | Admit: 2018-09-21 | Discharge: 2018-09-21 | Disposition: A | Discharge status: Home / Self Care | Payer: Medicare Other | Source: Ambulatory Visit | Attending: Family | Admitting: Family

## 2018-09-21 ENCOUNTER — Inpatient Hospital Stay: Payer: Medicare Other | Attending: Hematology & Oncology | Admitting: Hematology & Oncology

## 2018-09-21 ENCOUNTER — Inpatient Hospital Stay: Payer: Medicare Other

## 2018-09-21 VITALS — BP 136/79 | HR 68 | Temp 97.8°F | Resp 18 | Wt 192.8 lb

## 2018-09-21 DIAGNOSIS — E7211 Homocystinuria: Secondary | ICD-10-CM

## 2018-09-21 DIAGNOSIS — Z7901 Long term (current) use of anticoagulants: Secondary | ICD-10-CM | POA: Diagnosis not present

## 2018-09-21 DIAGNOSIS — Z79899 Other long term (current) drug therapy: Secondary | ICD-10-CM | POA: Diagnosis not present

## 2018-09-21 DIAGNOSIS — I471 Supraventricular tachycardia: Secondary | ICD-10-CM

## 2018-09-21 DIAGNOSIS — Z86718 Personal history of other venous thrombosis and embolism: Secondary | ICD-10-CM

## 2018-09-21 DIAGNOSIS — R7989 Other specified abnormal findings of blood chemistry: Secondary | ICD-10-CM

## 2018-09-21 DIAGNOSIS — I82509 Chronic embolism and thrombosis of unspecified deep veins of unspecified lower extremity: Secondary | ICD-10-CM | POA: Insufficient documentation

## 2018-09-21 DIAGNOSIS — I824Y1 Acute embolism and thrombosis of unspecified deep veins of right proximal lower extremity: Secondary | ICD-10-CM

## 2018-09-21 DIAGNOSIS — I82811 Embolism and thrombosis of superficial veins of right lower extremities: Secondary | ICD-10-CM | POA: Diagnosis not present

## 2018-09-21 DIAGNOSIS — M329 Systemic lupus erythematosus, unspecified: Secondary | ICD-10-CM | POA: Diagnosis not present

## 2018-09-21 DIAGNOSIS — I82401 Acute embolism and thrombosis of unspecified deep veins of right lower extremity: Secondary | ICD-10-CM

## 2018-09-21 DIAGNOSIS — I824Z1 Acute embolism and thrombosis of unspecified deep veins of right distal lower extremity: Secondary | ICD-10-CM | POA: Insufficient documentation

## 2018-09-21 DIAGNOSIS — I82451 Acute embolism and thrombosis of right peroneal vein: Secondary | ICD-10-CM | POA: Diagnosis not present

## 2018-09-21 HISTORY — DX: Chronic embolism and thrombosis of unspecified deep veins of unspecified lower extremity: I82.509

## 2018-09-21 LAB — CMP (CANCER CENTER ONLY)
ALT: 22 U/L (ref 0–44)
AST: 28 U/L (ref 15–41)
Albumin: 4.1 g/dL (ref 3.5–5.0)
Alkaline Phosphatase: 61 U/L (ref 38–126)
Anion gap: 10 (ref 5–15)
BUN: 15 mg/dL (ref 8–23)
CHLORIDE: 104 mmol/L (ref 98–111)
CO2: 25 mmol/L (ref 22–32)
Calcium: 9.4 mg/dL (ref 8.9–10.3)
Creatinine: 1.07 mg/dL (ref 0.61–1.24)
GFR, Estimated: >60 mL/min (ref >60)
Glucose, Bld: 96 mg/dL (ref 70–99)
POTASSIUM: 4.4 mmol/L (ref 3.5–5.1)
Sodium: 139 mmol/L (ref 135–145)
Total Bilirubin: 0.6 mg/dL (ref 0.3–1.2)
Total Protein: 7.6 g/dL (ref 6.5–8.1)

## 2018-09-21 LAB — CBC WITH DIFFERENTIAL (CANCER CENTER ONLY)
Abs Immature Granulocytes: 0.05 10*3/uL (ref 0.00–0.07)
BASOS PCT: 0 %
Basophils Absolute: 0 10*3/uL (ref 0.0–0.1)
EOS ABS: 0.2 10*3/uL (ref 0.0–0.5)
Eosinophils Relative: 3 %
HCT: 44.8 % (ref 39.0–52.0)
Hemoglobin: 14.1 g/dL (ref 13.0–17.0)
Immature Granulocytes: 1 %
LYMPHS ABS: 2 10*3/uL (ref 0.7–4.0)
Lymphocytes Relative: 29 %
MCH: 29.5 pg (ref 26.0–34.0)
MCHC: 31.5 g/dL (ref 30.0–36.0)
MCV: 93.7 fL (ref 80.0–100.0)
MONO ABS: 0.8 10*3/uL (ref 0.1–1.0)
MONOS PCT: 12 %
Neutro Abs: 3.7 10*3/uL (ref 1.7–7.7)
Neutrophils Relative %: 55 %
Platelet Count: 219 10*3/uL (ref 150–400)
RBC: 4.78 MIL/uL (ref 4.22–5.81)
RDW: 13.9 % (ref 11.5–15.5)
WBC Count: 6.7 10*3/uL (ref 4.0–10.5)
nRBC: 0 % (ref 0.0–0.2)

## 2018-09-21 NOTE — Progress Notes (Signed)
Hematology and Oncology Follow Up Visit  Roger Kent [id number] [birth year] 72 y.o. 09/21/2018   Principle Diagnosis:   DVT of the right peroneal vein  SVT of the right saphenous vein  (+) lupus anti-coagulant  Current Therapy:    Xarelto 20 mg po q day -- 1 yr of therapeutic to complete in 05/2019     Interim History:  Roger Kent is back for follow-up.  This is his second office visit.  We first saw him back in August.  We did go ahead and repeat a Doppler of the right leg.  This was done today.  The Doppler showed a occlusive thrombus in 1 of the paired right peroneal veins.  Also noted was a occlusive chronic SVT and a right saphenous vein.  We did hypercoagulable studies on him.  To no surprise, he did have a positive lupus anticoagulant.  We are going to have to repeat this to see if this is a true positive.  He is doing well on the Xarelto.  He is has some swelling in the left leg.  He had a blood clot in that leg in the past.  He does wear compression stockings when he exercises.  Para I told him the importance of staying well-hydrated.  This is incredibly important.  He must continue to drink quite a lot of water.  He and his wife will be going on a [place] cruise in March 2020.  I will make sure that I get him back before then.  He has had no cough.  He has had no shortness of breath.  He has had no headache.  He has had no nausea or vomiting.  Overall, his performance status is [name].  Medications:  Current Outpatient Medications:  .  BROMSITE 0.075 % SOLN, 1 drop at bedtime. 07/15/2018 Both eyes, Disp: , Rfl:  .  cholecalciferol (VITAMIN D) 1000 UNITS tablet, Take 1,000 Units by mouth every morning., Disp: , Rfl:  .  CRANBERRY PO, Take 1 capsule by mouth daily., Disp: , Rfl:  .  Cyanocobalamin (VITAMIN B 12 PO), Take by mouth., Disp: , Rfl:  .  folic acid (FOLVITE) 1 MG tablet, Take 2 tablets (2 mg total) by mouth daily., Disp: 60 tablet, Rfl: 12 .   glucosamine-chondroitin 500-400 MG tablet, Take 2 tablets by mouth every morning. , Disp: , Rfl:  .  Homeopathic Products (STRESS/EXHAUSTION RELIEF SL), Take 2 capsules by mouth at bedtime., Disp: , Rfl:  .  Lactobacillus (CVS ACIDOPHILUS PO), Take by mouth daily., Disp: , Rfl:  .  mirabegron ER (MYRBETRIQ) 25 MG TB24 tablet, Take 25 mg by mouth daily., Disp: , Rfl:  .  montelukast (SINGULAIR) 10 MG tablet, Take 1 tablet (10 mg total) by mouth daily. (Patient taking differently: Take 10 mg by mouth daily as needed. ), Disp: 90 tablet, Rfl: 1 .  Multiple Vitamin (MULTIVITAMIN WITH MINERALS) TABS tablet, Take 1 tablet by mouth every morning., Disp: , Rfl:  .  Nutritional Supplements (QUINOA KALE & HEMP) LIQD, Take by mouth daily., Disp: , Rfl:  .  olmesartan (BENICAR) 40 MG tablet, Take 1 tablet (40 mg total) by mouth daily., Disp: 90 tablet, Rfl: 3 .  omeprazole-sodium bicarbonate (ZEGERID) 40-1100 MG per capsule, Take 1 capsule by mouth daily before breakfast., Disp: , Rfl:  .  OVER THE COUNTER MEDICATION, Take 3 capsules by mouth every morning. OTC herbal supplement for prostate health, Disp: , Rfl:  .  OVER THE COUNTER MEDICATION,  Ultimate Bladder Support 1 each by mouth daily., Disp: , Rfl:  .  OVER THE COUNTER MEDICATION, Lipobiotol daily, Disp: , Rfl:  .  prednisoLONE acetate (PREDNISOLONE ACETATE P-F) 1 % ophthalmic suspension, Place 1 drop into the right eye 2 (two) times daily., Disp: , Rfl:  .  rivaroxaban (XARELTO) 20 MG TABS tablet, Take 1 tablet (20 mg total) by mouth daily., Disp: 30 tablet, Rfl: 5  Allergies:  Allergies  Allergen Reactions  . Oxycodone-Acetaminophen Other (See Comments)    Other Reaction: agitation  . Lamisil [Terbinafine] Rash    Past Medical History, Surgical history, Social history, and Family History were reviewed and updated.  Review of Systems: Review of Systems  Constitutional: Negative.   HENT:  Negative.   Eyes: Negative.   Respiratory: Negative.    Cardiovascular: Negative.   Gastrointestinal: Negative.   Endocrine: Negative.   Genitourinary: Negative.    Musculoskeletal: Negative.   Skin: Negative.   Neurological: Negative.   Hematological: Negative.   Psychiatric/Behavioral: Negative.     Physical Exam:  weight is 192 lb 12.8 oz (87.5 kg). His oral temperature is 97.8 F (36.6 C). His blood pressure is 136/79 and his pulse is 68. His respiration is 18 and oxygen saturation is 98%.   Wt Readings from Last 3 Encounters:  09/21/18 192 lb 12.8 oz (87.5 kg)  08/24/18 193 lb (87.5 kg)  07/15/18 189 lb 12.8 oz (86.1 kg)    Physical Exam  Constitutional: He is oriented to person, place, and time.  HENT:  Head: Normocephalic and atraumatic.  Mouth/Throat: Oropharynx is clear and moist.  Eyes: Pupils are equal, round, and reactive to light. EOM are normal.  Neck: Normal range of motion.  Cardiovascular: Normal rate, regular rhythm and normal heart sounds.  Pulmonary/Chest: Effort normal and breath sounds normal.  Abdominal: Soft. Bowel sounds are normal.  Musculoskeletal: Normal range of motion. He exhibits no edema, tenderness or deformity.  Lymphadenopathy:    He has no cervical adenopathy.  Neurological: He is alert and oriented to person, place, and time.  Skin: Skin is warm and dry. No rash noted. No erythema.  Psychiatric: He has a normal mood and affect. His behavior is normal. Judgment and thought content normal.  Vitals reviewed.    Lab Results  Component Value Date   WBC 6.7 09/21/2018   HGB 14.1 09/21/2018   HCT 44.8 09/21/2018   MCV 93.7 09/21/2018   PLT 219 09/21/2018     Chemistry      Component Value Date/Time   NA 138 08/24/2018 1051   K 4.2 08/24/2018 1051   CL 104 08/24/2018 1051   CO2 27 08/24/2018 1051   BUN 16 08/24/2018 1051   CREATININE 1.01 08/24/2018 1051   CREATININE 1.10 07/15/2018 1018   CREATININE 1.05 10/28/2016 1150      Component Value Date/Time   CALCIUM 9.2 08/24/2018  1051   ALKPHOS 56 08/24/2018 1051   AST 22 08/24/2018 1051   AST 23 07/15/2018 1018   ALT 16 08/24/2018 1051   ALT 19 07/15/2018 1018   BILITOT 0.6 08/24/2018 1051   BILITOT 0.7 07/15/2018 1018       Impression and Plan: Roger Kent is a 72 year old white male.  He had a thrombus in the left leg about 6 years ago.  He now has a thrombus in the right leg.  He is on Xarelto right now.  He will have at least a year of therapeutic Xarelto and then maintenance Xarelto  afterwards.  I am not sure of the lupus anticoagulant is truly positive.  If it is positive only see him back, then I would make sure that he gets onto aspirin.  I would like to do another Doppler when I see him back in 3 months.  I, again, told the importance of staying well-hydrated.   Volanda Napoleon, MD 11/4/201912:29 PM

## 2018-09-22 LAB — HOMOCYSTEINE: Homocysteine: 13.3 umol/L (ref 0.0–15.0)

## 2018-09-23 LAB — LUPUS ANTICOAGULANT PANEL
DRVVT: 104.3 s — AB (ref 0.0–47.0)
PTT Lupus Anticoagulant: 38.1 s (ref 0.0–51.9)

## 2018-09-23 LAB — DRVVT MIX: dRVVT Mix: 71.1 s — ABNORMAL HIGH (ref 0.0–47.0)

## 2018-09-23 LAB — DRVVT CONFIRM: DRVVT CONFIRM: 1.8 ratio — AB (ref 0.8–1.2)

## 2018-11-17 ENCOUNTER — Other Ambulatory Visit: Payer: Self-pay | Admitting: *Deleted

## 2018-11-17 DIAGNOSIS — I714 Abdominal aortic aneurysm, without rupture, unspecified: Secondary | ICD-10-CM

## 2018-11-17 DIAGNOSIS — I723 Aneurysm of iliac artery: Secondary | ICD-10-CM

## 2018-11-19 ENCOUNTER — Other Ambulatory Visit: Payer: Self-pay | Admitting: Cardiology

## 2018-11-19 DIAGNOSIS — I723 Aneurysm of iliac artery: Secondary | ICD-10-CM

## 2018-11-26 ENCOUNTER — Other Ambulatory Visit: Payer: Self-pay | Admitting: *Deleted

## 2018-11-26 ENCOUNTER — Ambulatory Visit (HOSPITAL_COMMUNITY)
Admission: RE | Admit: 2018-11-26 | Discharge: 2018-11-26 | Disposition: A | Discharge status: Home / Self Care | Payer: Medicare Other | Source: Ambulatory Visit | Attending: Cardiovascular Disease | Admitting: Cardiovascular Disease

## 2018-11-26 DIAGNOSIS — I714 Abdominal aortic aneurysm, without rupture, unspecified: Secondary | ICD-10-CM

## 2018-11-26 DIAGNOSIS — I723 Aneurysm of iliac artery: Secondary | ICD-10-CM

## 2018-11-26 NOTE — Progress Notes (Signed)
vas 

## 2018-12-02 DIAGNOSIS — M1712 Unilateral primary osteoarthritis, left knee: Secondary | ICD-10-CM | POA: Diagnosis not present

## 2018-12-02 DIAGNOSIS — M25561 Pain in right knee: Secondary | ICD-10-CM | POA: Diagnosis not present

## 2018-12-02 DIAGNOSIS — M25562 Pain in left knee: Secondary | ICD-10-CM | POA: Diagnosis not present

## 2018-12-02 DIAGNOSIS — M170 Bilateral primary osteoarthritis of knee: Secondary | ICD-10-CM | POA: Diagnosis not present

## 2018-12-07 ENCOUNTER — Encounter (INDEPENDENT_AMBULATORY_CARE_PROVIDER_SITE_OTHER): Payer: Medicare Other | Admitting: Ophthalmology

## 2018-12-07 DIAGNOSIS — H43813 Vitreous degeneration, bilateral: Secondary | ICD-10-CM

## 2018-12-07 DIAGNOSIS — H35033 Hypertensive retinopathy, bilateral: Secondary | ICD-10-CM

## 2018-12-07 DIAGNOSIS — I10 Essential (primary) hypertension: Secondary | ICD-10-CM

## 2018-12-07 DIAGNOSIS — H338 Other retinal detachments: Secondary | ICD-10-CM | POA: Diagnosis not present

## 2018-12-07 DIAGNOSIS — H59031 Cystoid macular edema following cataract surgery, right eye: Secondary | ICD-10-CM | POA: Diagnosis not present

## 2018-12-09 DIAGNOSIS — M1711 Unilateral primary osteoarthritis, right knee: Secondary | ICD-10-CM | POA: Diagnosis not present

## 2018-12-09 DIAGNOSIS — M25561 Pain in right knee: Secondary | ICD-10-CM | POA: Diagnosis not present

## 2018-12-12 NOTE — Progress Notes (Signed)
HPI: FU congenital VSD, and prior DVT. Note a previous nuclear study performed in January of 2003 showed an ejection fraction of 50% and no ischemia or infarction. Echocardiogram 2/19 showed normal LV function; 4 mm VSD; trace AI; moderate LAE. Abdominal ultrasound 1/20 showed dilated iliacs (R 1.7x1.7 and L 1.5x1.6) and abd aorta 2.6 cm. On xarelto for DVT. Since I last saw himthe patient denies any dyspnea on exertion, orthopnea, PND, pedal edema, palpitations, syncope or chest pain.   Current Outpatient Medications  Medication Sig Dispense Refill  . BROMSITE 0.075 % SOLN 1 drop at bedtime. 07/15/2018 Both eyes    . cholecalciferol (VITAMIN D) 1000 UNITS tablet Take 1,000 Units by mouth every morning.    Marland Kitchen CRANBERRY PO Take 1 capsule by mouth daily.    . Cyanocobalamin (VITAMIN B 12 PO) Take by mouth.    . folic acid (FOLVITE) 1 MG tablet Take 2 tablets (2 mg total) by mouth daily. 60 tablet 12  . glucosamine-chondroitin 500-400 MG tablet Take 2 tablets by mouth every morning.     . Homeopathic Products (STRESS/EXHAUSTION RELIEF SL) Take 2 capsules by mouth at bedtime.    . Lactobacillus (CVS ACIDOPHILUS PO) Take by mouth daily.    . montelukast (SINGULAIR) 10 MG tablet Take 1 tablet (10 mg total) by mouth daily. (Patient taking differently: Take 10 mg by mouth as needed. ) 90 tablet 1  . Multiple Vitamin (MULTIVITAMIN WITH MINERALS) TABS tablet Take 1 tablet by mouth every morning.    . Nutritional Supplements (QUINOA KALE & HEMP) LIQD Take by mouth daily.    Marland Kitchen olmesartan (BENICAR) 40 MG tablet Take 1 tablet (40 mg total) by mouth daily. 90 tablet 3  . omeprazole-sodium bicarbonate (ZEGERID) 40-1100 MG per capsule Take 1 capsule by mouth daily before breakfast.    . OVER THE COUNTER MEDICATION Take 3 capsules by mouth every morning. OTC herbal supplement for prostate health    . OVER THE COUNTER MEDICATION Ultimate Bladder Support 1 each by mouth daily.    Marland Kitchen OVER THE COUNTER  MEDICATION Lipobiotol daily    . prednisoLONE acetate (PREDNISOLONE ACETATE P-F) 1 % ophthalmic suspension Place 1 drop into the right eye 2 (two) times daily.    . rivaroxaban (XARELTO) 20 MG TABS tablet Take 1 tablet (20 mg total) by mouth daily. 90 tablet 1   No current facility-administered medications for this visit.      Past Medical History:  Diagnosis Date  . Acute hepatitis C without mention of hepatic coma(070.51)   . Allergic rhinitis, cause unspecified   . Anxiety state, unspecified   . Barrett's esophagus   . BPH (benign prostatic hyperplasia)   . DVT (deep venous thrombosis) ([place])   . Elevated PSA    Dr Karsten Ro  . Esophageal reflux   . Hiatal hernia   . Hyperlipidemia   . Hypertension   . Hypertonicity of bladder   . Insomnia   . Leg DVT (deep venous thromboembolism), chronic, unspecified laterality ([place]) 09/21/2018  . Osteoporosis, unspecified   . Personal history of urinary calculi   . Recent retinal detachment, partial, with giant tear   . Unspecified tinnitus   . Ventricular septal defect     Past Surgical History:  Procedure Laterality Date  . KNEE SURGERY    . RETINAL DETACHMENT SURGERY      Social History   Socioeconomic History  . Marital status: Married    Spouse name: Sipriano Fendley  .  Number of children: Not on file  . Years of education: Not on file  . Highest education level: Not on file  Occupational History  . 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 Occupation: bee keepin  Social Needs  . Financial resource strain: Not on file  . Food insecurity:    Worry: Not on file    Inability: Not on file  . Transportation needs:    Medical: Not on file    Non-medical: Not on file  Tobacco Use  . Smoking status: Former Research scientist (life sciences)  . Smokeless tobacco: Never Used  Substance and Sexual Activity  . Alcohol use: No  . Drug use: No  . Sexual activity: Yes  Lifestyle  . Physical activity:    Days per week: Not on  file    Minutes per session: Not on file  . Stress: Not on file  Relationships  . Social connections:    Talks on phone: Not on file    Gets together: Not on file    Attends religious service: Not on file    Active member of club or organization: Not on file    Attends meetings of clubs or organizations: Not on file    Relationship status: Not on file  . Intimate partner violence:    Fear of current or ex partner: Not on file    Emotionally abused: Not on file    Physically abused: Not on file    Forced sexual activity: Not on file  Other Topics Concern  . Not on file  Social History Narrative                Family History  Problem Relation Age of Onset  . Hypertension Other   . Alcohol abuse Neg Hx     ROS: no fevers or chills, productive cough, hemoptysis, dysphasia, odynophagia, melena, hematochezia, dysuria, hematuria, rash, seizure activity, orthopnea, PND, pedal edema, claudication. Remaining systems are negative.  Physical Exam: Well-developed well-nourished in no acute distress.  Skin is warm and dry.  HEENT is normal.  Neck is supple.  Chest is clear to auscultation with normal expansion.  Cardiovascular exam is regular rate and rhythm. 3/6 systolic murmur Abdominal exam nontender or distended. No masses palpated. Extremities show no edema. neuro grossly intact  ECG-sinus rhythm at a rate of 71, no ST changes.  Personally reviewed  A/P  1 perimembranous ventricular septal defect-small on previous echocardiogram.  Will repeat study.  No history of right ventricular or right atrial enlargement.  2 history of iliac aneurysms-we will need follow-up ultrasound January 2021.  3 hypertension-blood pressure is controlled.  Continue present medications and follow.  4 history of DVT-continue Xarelto.  Managed by primary care.  5 hyperlipidemia-Per primary care.  Kirk Ruths, MD

## 2018-12-15 DIAGNOSIS — M1712 Unilateral primary osteoarthritis, left knee: Secondary | ICD-10-CM | POA: Diagnosis not present

## 2018-12-15 DIAGNOSIS — R972 Elevated prostate specific antigen [PSA]: Secondary | ICD-10-CM | POA: Diagnosis not present

## 2018-12-15 DIAGNOSIS — M25562 Pain in left knee: Secondary | ICD-10-CM | POA: Diagnosis not present

## 2018-12-16 DIAGNOSIS — M1711 Unilateral primary osteoarthritis, right knee: Secondary | ICD-10-CM | POA: Diagnosis not present

## 2018-12-16 DIAGNOSIS — M25561 Pain in right knee: Secondary | ICD-10-CM | POA: Diagnosis not present

## 2018-12-18 ENCOUNTER — Other Ambulatory Visit: Payer: Self-pay

## 2018-12-18 MED ORDER — RIVAROXABAN 20 MG PO TABS: 20.0000 mg | ORAL_TABLET | Freq: Every day | ORAL | 1 refills | Status: DC

## 2018-12-21 DIAGNOSIS — M1712 Unilateral primary osteoarthritis, left knee: Secondary | ICD-10-CM | POA: Diagnosis not present

## 2018-12-21 DIAGNOSIS — M25562 Pain in left knee: Secondary | ICD-10-CM | POA: Diagnosis not present

## 2018-12-22 DIAGNOSIS — M25561 Pain in right knee: Secondary | ICD-10-CM | POA: Diagnosis not present

## 2018-12-22 DIAGNOSIS — M1711 Unilateral primary osteoarthritis, right knee: Secondary | ICD-10-CM | POA: Diagnosis not present

## 2018-12-23 ENCOUNTER — Ambulatory Visit (INDEPENDENT_AMBULATORY_CARE_PROVIDER_SITE_OTHER): Payer: Medicare Other | Admitting: Cardiology

## 2018-12-23 ENCOUNTER — Encounter: Payer: Self-pay | Admitting: Cardiology

## 2018-12-23 VITALS — BP 122/72 | HR 71 | Ht 73.0 in | Wt 191.4 lb

## 2018-12-23 DIAGNOSIS — I10 Essential (primary) hypertension: Secondary | ICD-10-CM

## 2018-12-23 DIAGNOSIS — I723 Aneurysm of iliac artery: Secondary | ICD-10-CM | POA: Diagnosis not present

## 2018-12-23 DIAGNOSIS — Q210 Ventricular septal defect: Secondary | ICD-10-CM | POA: Diagnosis not present

## 2018-12-23 NOTE — Patient Instructions (Signed)
Medication Instructions:  NO CHANGE If you need a refill on your cardiac medications before your next appointment, please call your pharmacy.   Lab work: If you have labs (blood work) drawn today and your tests are completely normal, you will receive your results only by: Marland Kitchen MyChart Message (if you have MyChart) OR . A paper copy in the mail If you have any lab test that is abnormal or we need to change your treatment, we will call you to review the results.  Testing/Procedures: Your physician has requested that you have an echocardiogram. Echocardiography is a painless test that uses sound waves to create images of your heart. It provides your doctor with information about the size and shape of your heart and how well your heart's chambers and valves are working. This procedure takes approximately one hour. There are no restrictions for this procedure.  [place]  Follow-Up: At [facility], you and your health needs are our priority.  As part of our continuing mission to provide you with exceptional heart care, we have created designated Provider Care Teams.  These Care Teams include your primary Cardiologist (physician) and Advanced Practice Providers (APPs -  Physician Assistants and Nurse Practitioners) who all work together to provide you with the care you need, when you need it. You will need a follow up appointment in 12 months.  Please call our office 2 months in advance to schedule this appointment.  You may see Kirk Ruths MD or one of the following Advanced Practice Providers on your designated Care Team:   Kerin Ransom, PA-C Roby Lofts, [place] . Sande Rives, PA-C CALL IN December TO SCHEDULE APPOINTMENT IN [place]

## 2018-12-28 DIAGNOSIS — Z961 Presence of intraocular lens: Secondary | ICD-10-CM | POA: Diagnosis not present

## 2018-12-28 DIAGNOSIS — H35353 Cystoid macular degeneration, bilateral: Secondary | ICD-10-CM | POA: Diagnosis not present

## 2018-12-29 ENCOUNTER — Encounter: Payer: Self-pay | Admitting: Internal Medicine

## 2018-12-29 ENCOUNTER — Ambulatory Visit (INDEPENDENT_AMBULATORY_CARE_PROVIDER_SITE_OTHER): Payer: Medicare Other | Admitting: Internal Medicine

## 2018-12-29 ENCOUNTER — Other Ambulatory Visit (INDEPENDENT_AMBULATORY_CARE_PROVIDER_SITE_OTHER): Payer: Medicare Other

## 2018-12-29 VITALS — BP 120/72 | HR 79 | Temp 98.1°F | Ht 73.0 in | Wt 192.0 lb

## 2018-12-29 DIAGNOSIS — E785 Hyperlipidemia, unspecified: Secondary | ICD-10-CM

## 2018-12-29 DIAGNOSIS — Z8619 Personal history of other infectious and parasitic diseases: Secondary | ICD-10-CM | POA: Diagnosis not present

## 2018-12-29 DIAGNOSIS — R739 Hyperglycemia, unspecified: Secondary | ICD-10-CM

## 2018-12-29 DIAGNOSIS — I10 Essential (primary) hypertension: Secondary | ICD-10-CM

## 2018-12-29 DIAGNOSIS — R972 Elevated prostate specific antigen [PSA]: Secondary | ICD-10-CM | POA: Diagnosis not present

## 2018-12-29 DIAGNOSIS — G8929 Other chronic pain: Secondary | ICD-10-CM | POA: Diagnosis not present

## 2018-12-29 DIAGNOSIS — M545 Low back pain: Secondary | ICD-10-CM

## 2018-12-29 LAB — BASIC METABOLIC PANEL
BUN: 18 mg/dL (ref 6–23)
CHLORIDE: 103 meq/L (ref 96–112)
CO2: 26 mEq/L (ref 19–32)
Calcium: 9.1 mg/dL (ref 8.4–10.5)
Creatinine, Ser: 0.93 mg/dL (ref 0.40–1.50)
GFR: 79.67 mL/min (ref >60.00)
Glucose, Bld: 97 mg/dL (ref 70–99)
POTASSIUM: 4.5 meq/L (ref 3.5–5.1)
Sodium: 136 mEq/L (ref 135–145)

## 2018-12-29 LAB — HEPATIC FUNCTION PANEL
ALT: 18 U/L (ref 0–53)
AST: 20 U/L (ref 0–37)
Albumin: 4.3 g/dL (ref 3.5–5.2)
Alkaline Phosphatase: 51 U/L (ref 39–117)
BILIRUBIN DIRECT: 0.1 mg/dL (ref 0.0–0.3)
BILIRUBIN TOTAL: 0.7 mg/dL (ref 0.2–1.2)
TOTAL PROTEIN: 7.4 g/dL (ref 6.0–8.3)

## 2018-12-29 LAB — HEMOGLOBIN A1C: HEMOGLOBIN A1C: 5.8 % (ref 4.6–6.5)

## 2018-12-29 LAB — TSH: TSH: 2.37 u[IU]/mL (ref 0.35–4.50)

## 2018-12-29 MED ORDER — FOLIC ACID 1 MG PO TABS: 2.0000 mg | ORAL_TABLET | Freq: Every day | ORAL | 3 refills | Status: DC

## 2018-12-29 NOTE — Assessment & Plan Note (Signed)
Labs

## 2018-12-29 NOTE — Assessment & Plan Note (Signed)
A cardiac CT scan for calcium scoring offered 

## 2018-12-29 NOTE — Assessment & Plan Note (Signed)
F/u w/Dr Ottelin 

## 2018-12-29 NOTE — Assessment & Plan Note (Signed)
Nl BP at home On Benicar, HCT

## 2018-12-29 NOTE — Assessment & Plan Note (Signed)
LBP is better 

## 2018-12-29 NOTE — Patient Instructions (Signed)

## 2018-12-29 NOTE — Progress Notes (Signed)
Subjective:  Patient ID: Roger Kent, male    DOB: November 10, 1946  Age: 73 y.o. MRN: [id number]  CC: No chief complaint on file.   HPI Roger Kent presents for DVT, anticoagulation, h/o hep C f/u  Outpatient Medications Prior to Visit  Medication Sig Dispense Refill  . cholecalciferol (VITAMIN D) 1000 UNITS tablet Take 1,000 Units by mouth every morning.    Marland Kitchen CRANBERRY PO Take 1 capsule by mouth daily.    . Cyanocobalamin (VITAMIN B 12 PO) Take by mouth.    . Eyelid Cleansers (AVENOVA) 0.01 % SOLN Apply topically daily.    . folic acid (FOLVITE) 1 MG tablet Take 2 tablets (2 mg total) by mouth daily. 60 tablet 12  . glucosamine-chondroitin 500-400 MG tablet Take 2 tablets by mouth every morning.     . Homeopathic Products (STRESS/EXHAUSTION RELIEF SL) Take 2 capsules by mouth at bedtime.    . Lactobacillus (CVS ACIDOPHILUS PO) Take by mouth daily.    Marland Kitchen Lifitegrast (XIIDRA) 5 % SOLN Apply to eye 2 (two) times daily.    Marland Kitchen loteprednol (LOTEMAX) 0.5 % ophthalmic suspension 3 (three) times daily.    . montelukast (SINGULAIR) 10 MG tablet Take 1 tablet (10 mg total) by mouth daily. (Patient taking differently: Take 10 mg by mouth as needed. ) 90 tablet 1  . Multiple Vitamin (MULTIVITAMIN WITH MINERALS) TABS tablet Take 1 tablet by mouth every morning.    . Nutritional Supplements (QUINOA KALE & HEMP) LIQD Take by mouth daily.    Marland Kitchen olmesartan (BENICAR) 40 MG tablet Take 1 tablet (40 mg total) by mouth daily. 90 tablet 3  . omeprazole-sodium bicarbonate (ZEGERID) 40-1100 MG per capsule Take 1 capsule by mouth daily before breakfast.    . OVER THE COUNTER MEDICATION Take 3 capsules by mouth every morning. OTC herbal supplement for prostate health    . OVER THE COUNTER MEDICATION Lipobiotol daily    . rivaroxaban (XARELTO) 20 MG TABS tablet Take 1 tablet (20 mg total) by mouth daily. 90 tablet 1  . BROMSITE 0.075 % SOLN 1 drop at bedtime. 07/15/2018 Both eyes    . OVER THE COUNTER  MEDICATION Ultimate Bladder Support 1 each by mouth daily.    . prednisoLONE acetate (PREDNISOLONE ACETATE P-F) 1 % ophthalmic suspension Place 1 drop into the right eye 2 (two) times daily.     No facility-administered medications prior to visit.     ROS: Review of Systems  Constitutional: Negative for appetite change, fatigue and unexpected weight change.  HENT: Negative for congestion, nosebleeds, sneezing, sore throat and trouble swallowing.   Eyes: Negative for itching and visual disturbance.  Respiratory: Negative for cough.   Cardiovascular: Negative for chest pain, palpitations and leg swelling.  Gastrointestinal: Negative for abdominal distention, blood in stool, diarrhea and nausea.  Genitourinary: Negative for frequency and hematuria.  Musculoskeletal: Positive for arthralgias. Negative for back pain, gait problem, joint swelling and neck pain.  Skin: Negative for rash.  Neurological: Negative for dizziness, tremors, speech difficulty and weakness.  Psychiatric/Behavioral: Negative for agitation, dysphoric mood and sleep disturbance. The patient is not nervous/anxious.     Objective:  BP 120/72 (BP Location: Left Arm, Patient Position: Sitting, Cuff Size: Large)   Pulse 79   Temp 98.1 F (36.7 C) (Oral)   Ht 6\' 1"  (1.854 m)   Wt 192 lb (87.1 kg)   SpO2 96%   BMI 25.33 kg/m   BP Readings from Last 3 Encounters:  12/29/18  120/72  12/23/18 122/72  09/21/18 136/79    Wt Readings from Last 3 Encounters:  12/29/18 192 lb (87.1 kg)  12/23/18 191 lb 6.4 oz (86.8 kg)  09/21/18 192 lb 12.8 oz (87.5 kg)    Physical Exam Constitutional:      General: He is not in acute distress.    Appearance: He is well-developed.     Comments: NAD  Eyes:     Conjunctiva/sclera: Conjunctivae normal.     Pupils: Pupils are equal, round, and reactive to light.  Neck:     Musculoskeletal: Normal range of motion.     Thyroid: No thyromegaly.     Vascular: No JVD.  Cardiovascular:       Rate and Rhythm: Normal rate and regular rhythm.     Heart sounds: Murmur present. No friction rub. No gallop.   Pulmonary:     Effort: Pulmonary effort is normal. No respiratory distress.     Breath sounds: Normal breath sounds. No wheezing or rales.  Chest:     Chest wall: No tenderness.  Abdominal:     General: Bowel sounds are normal. There is no distension.     Palpations: Abdomen is soft. There is no mass.     Tenderness: There is no abdominal tenderness. There is no guarding or rebound.  Musculoskeletal: Normal range of motion.        General: No tenderness.  Lymphadenopathy:     Cervical: No cervical adenopathy.  Skin:    General: Skin is warm and dry.     Findings: No rash.  Neurological:     Mental Status: He is alert and oriented to person, place, and time.     Cranial Nerves: No cranial nerve deficit.     Motor: No abnormal muscle tone.     Coordination: Coordination normal.     Gait: Gait normal.     Deep Tendon Reflexes: Reflexes are normal and symmetric.  Psychiatric:        Behavior: Behavior normal.        Thought Content: Thought content normal.        Judgment: Judgment normal.     Lab Results  Component Value Date   WBC 6.7 09/21/2018   HGB 14.1 09/21/2018   HCT 44.8 09/21/2018   PLT 219 09/21/2018   GLUCOSE 96 09/21/2018   CHOL 146 08/24/2018   TRIG 109.0 08/24/2018   HDL 43.70 08/24/2018   LDLCALC 81 08/24/2018   ALT 22 09/21/2018   AST 28 09/21/2018   NA 139 09/21/2018   K 4.4 09/21/2018   CL 104 09/21/2018   CREATININE 1.07 09/21/2018   BUN 15 09/21/2018   CO2 25 09/21/2018   TSH 2.95 08/24/2018   PSA 9.86 (H) 08/24/2018   INR 2.4 RATIO (H) 09/17/2007   HGBA1C 5.6 12/22/2015    Vas [place] Aaa Duplex  Result Date: 11/26/2018 ABDOMINAL AORTA STUDY Indications: Follow up exam for known AAA. Risk Factors: Hypertension, hyperlipidemia, past history of smoking. Limitations: Air/bowel gas.  Comparison Study: In 11/2017, an aorta duplex showed  a right Common Iliac artery                   measuring 1.7 cm and the left Common Iliac artery measured 1.6                   cm. Performing Technologist: Wilkie Aye RVT  Examination Guidelines: A complete evaluation includes B-mode imaging, spectral Doppler, color Doppler, and power  Doppler as needed of all accessible portions of each vessel. Bilateral testing is considered an integral part of a complete examination. Limited examinations for reoccurring indications may be performed as noted.  Abdominal Aorta Findings: +-----------+-------+----------+----------+--------+--------+--------+ Location   AP (cm)Trans (cm)PSV (cm/s)WaveformThrombusComments +-----------+-------+----------+----------+--------+--------+--------+ Proximal   2.20   2.30      82        biphasic                 +-----------+-------+----------+----------+--------+--------+--------+ Mid        2.10   2.30      71        biphasic                 +-----------+-------+----------+----------+--------+--------+--------+ Distal     2.60   2.60      49        biphasic                 +-----------+-------+----------+----------+--------+--------+--------+ RT CIA Prox1.7    1.7       70        biphasic                 +-----------+-------+----------+----------+--------+--------+--------+ RT EIA Prox1.3    1.4       57        biphasic                 +-----------+-------+----------+----------+--------+--------+--------+ LT CIA Prox1.5    1.6       76        biphasic                 +-----------+-------+----------+----------+--------+--------+--------+ LT EIA Prox1.2    1.3       122       biphasic                 +-----------+-------+----------+----------+--------+--------+--------+ IVC/Iliac Findings: +--------+------+   IVC   Patent +--------+------+ IVC Proxpatent +--------+------+  Summary:  Study is technically challenging secondary to tortuosity of right common and external iliac  arteries. The largest aortic measurement is 2.6 cm The largest aortic diameter has increased compared to prior exam. Previous diameter measurement was 2.3 cm obtained on 11/2017. Right common iliac artery aneurysm, measuring 1.7 cm x 1.7 cm. Left common iliac artery aneurysm, measuring 1.5 cm x 1.6 cm.  *See table(s) above for measurements and observations. Suggest follow up study in 12 months.  Electronically signed by Ena Dawley MD on 11/26/2018 at 10:22:51 AM.    Final     Assessment & Plan:   There are no diagnoses linked to this encounter.   No orders of the defined types were placed in this encounter.    Follow-up: No follow-ups on file.  Walker Kehr, MD

## 2018-12-29 NOTE — Assessment & Plan Note (Signed)
A1c

## 2018-12-30 DIAGNOSIS — M1712 Unilateral primary osteoarthritis, left knee: Secondary | ICD-10-CM | POA: Diagnosis not present

## 2018-12-30 DIAGNOSIS — M25562 Pain in left knee: Secondary | ICD-10-CM | POA: Diagnosis not present

## 2018-12-31 DIAGNOSIS — M1711 Unilateral primary osteoarthritis, right knee: Secondary | ICD-10-CM | POA: Diagnosis not present

## 2018-12-31 DIAGNOSIS — M25561 Pain in right knee: Secondary | ICD-10-CM | POA: Diagnosis not present

## 2018-12-31 LAB — HEPATITIS C RNA QUANTITATIVE
HCV QUANT LOG: NOT DETECTED {Log_IU}/mL
HCV RNA, PCR, QN: <15 IU/mL

## 2019-01-04 ENCOUNTER — Ambulatory Visit (HOSPITAL_COMMUNITY): Payer: Medicare Other | Attending: Cardiology

## 2019-01-04 DIAGNOSIS — M1712 Unilateral primary osteoarthritis, left knee: Secondary | ICD-10-CM | POA: Diagnosis not present

## 2019-01-04 DIAGNOSIS — M25562 Pain in left knee: Secondary | ICD-10-CM | POA: Diagnosis not present

## 2019-01-04 DIAGNOSIS — Q210 Ventricular septal defect: Secondary | ICD-10-CM | POA: Insufficient documentation

## 2019-01-05 ENCOUNTER — Other Ambulatory Visit: Payer: Self-pay | Admitting: *Deleted

## 2019-01-05 DIAGNOSIS — I359 Nonrheumatic aortic valve disorder, unspecified: Secondary | ICD-10-CM

## 2019-01-06 DIAGNOSIS — M25561 Pain in right knee: Secondary | ICD-10-CM | POA: Diagnosis not present

## 2019-01-06 DIAGNOSIS — M1711 Unilateral primary osteoarthritis, right knee: Secondary | ICD-10-CM | POA: Diagnosis not present

## 2019-01-11 ENCOUNTER — Other Ambulatory Visit: Payer: Self-pay

## 2019-01-11 ENCOUNTER — Inpatient Hospital Stay: Payer: Medicare Other | Attending: Hematology & Oncology | Admitting: Hematology & Oncology

## 2019-01-11 ENCOUNTER — Inpatient Hospital Stay: Payer: Medicare Other

## 2019-01-11 ENCOUNTER — Ambulatory Visit (HOSPITAL_BASED_OUTPATIENT_CLINIC_OR_DEPARTMENT_OTHER)
Admission: RE | Admit: 2019-01-11 | Discharge: 2019-01-11 | Disposition: A | Discharge status: Home / Self Care | Payer: Medicare Other | Source: Ambulatory Visit | Attending: Hematology & Oncology | Admitting: Hematology & Oncology

## 2019-01-11 ENCOUNTER — Encounter: Payer: Self-pay | Admitting: Hematology & Oncology

## 2019-01-11 VITALS — BP 139/85 | HR 64 | Temp 98.2°F | Resp 19 | Wt 193.2 lb

## 2019-01-11 DIAGNOSIS — Z7901 Long term (current) use of anticoagulants: Secondary | ICD-10-CM | POA: Insufficient documentation

## 2019-01-11 DIAGNOSIS — I82451 Acute embolism and thrombosis of right peroneal vein: Secondary | ICD-10-CM | POA: Diagnosis not present

## 2019-01-11 DIAGNOSIS — I82811 Embolism and thrombosis of superficial veins of right lower extremities: Secondary | ICD-10-CM | POA: Diagnosis not present

## 2019-01-11 DIAGNOSIS — I82509 Chronic embolism and thrombosis of unspecified deep veins of unspecified lower extremity: Secondary | ICD-10-CM

## 2019-01-11 DIAGNOSIS — I82401 Acute embolism and thrombosis of unspecified deep veins of right lower extremity: Secondary | ICD-10-CM | POA: Diagnosis not present

## 2019-01-11 DIAGNOSIS — I471 Supraventricular tachycardia: Secondary | ICD-10-CM

## 2019-01-11 DIAGNOSIS — D6862 Lupus anticoagulant syndrome: Secondary | ICD-10-CM | POA: Diagnosis not present

## 2019-01-11 LAB — CMP (CANCER CENTER ONLY)
ALT: 18 U/L (ref 0–44)
AST: 20 U/L (ref 15–41)
Albumin: 4.7 g/dL (ref 3.5–5.0)
Alkaline Phosphatase: 48 U/L (ref 38–126)
Anion gap: 6 (ref 5–15)
BUN: 15 mg/dL (ref 8–23)
CALCIUM: 9.6 mg/dL (ref 8.9–10.3)
CO2: 29 mmol/L (ref 22–32)
CREATININE: 0.99 mg/dL (ref 0.61–1.24)
Chloride: 102 mmol/L (ref 98–111)
Glucose, Bld: 103 mg/dL — ABNORMAL HIGH (ref 70–99)
Potassium: 4.2 mmol/L (ref 3.5–5.1)
Sodium: 137 mmol/L (ref 135–145)
Total Bilirubin: 0.9 mg/dL (ref 0.3–1.2)
Total Protein: 7.1 g/dL (ref 6.5–8.1)

## 2019-01-11 LAB — CBC WITH DIFFERENTIAL (CANCER CENTER ONLY)
Abs Immature Granulocytes: 0.01 10*3/uL (ref 0.00–0.07)
BASOS PCT: 1 %
Basophils Absolute: 0 10*3/uL (ref 0.0–0.1)
EOS ABS: 0.1 10*3/uL (ref 0.0–0.5)
EOS PCT: 2 %
HCT: 44.3 % (ref 39.0–52.0)
Hemoglobin: 14.6 g/dL (ref 13.0–17.0)
Immature Granulocytes: 0 %
Lymphocytes Relative: 27 %
Lymphs Abs: 2.1 10*3/uL (ref 0.7–4.0)
MCH: 31.3 pg (ref 26.0–34.0)
MCHC: 33 g/dL (ref 30.0–36.0)
MCV: 95.1 fL (ref 80.0–100.0)
MONO ABS: 0.8 10*3/uL (ref 0.1–1.0)
Monocytes Relative: 10 %
Neutro Abs: 4.7 10*3/uL (ref 1.7–7.7)
Neutrophils Relative %: 60 %
PLATELETS: 232 10*3/uL (ref 150–400)
RBC: 4.66 MIL/uL (ref 4.22–5.81)
RDW: 13.5 % (ref 11.5–15.5)
WBC Count: 7.8 10*3/uL (ref 4.0–10.5)
nRBC: 0 % (ref 0.0–0.2)

## 2019-01-11 LAB — D-DIMER, QUANTITATIVE (NOT AT ARMC): D DIMER QUANT: 1.4 ug{FEU}/mL — AB (ref 0.00–0.50)

## 2019-01-11 NOTE — Progress Notes (Signed)
Hematology and Oncology Follow Up Visit  Roger Kent [id number] September 05, 1946 73 y.o. 01/11/2019   Principle Diagnosis:   DVT of the right peroneal vein  SVT of the right saphenous vein  (+) lupus anti-coagulant  Current Therapy:    Xarelto 20 mg po q day -- 1 yr of therapeutic to complete in 05/2019  EC ASA 81 mg po q day -- started on 01/11/2019     Interim History:  Roger Kent is back for follow-up.  So far, he has been doing fairly well with the Xarelto.  We went ahead and repeated his Doppler of the legs.  There is no DVT noted in the legs.  He had a thrombophlebitis in the right great saphenous vein.  He had bilateral Baker's cysts.  The left peroneal vein was not visualized and this might be from chronic thrombosis.  He does have a persistent lupus anticoagulant.  Given this fact, I am going to put him on baby aspirin while he is on Xarelto.  I think baby aspirin would be reasonable.  He has had no bleeding.  There is no issues with bowels or bladder.  He has had no leg pain.  He states that there is some occasional leg strain.  There is been no cough.  He has had no chest wall pain.  He has had no fever.  He has had no headache.    Overall, his performance status is [name].  Medications:  Current Outpatient Medications:  .  cholecalciferol (VITAMIN D) 1000 UNITS tablet, Take 1,000 Units by mouth every morning., Disp: , Rfl:  .  CRANBERRY PO, Take 1 capsule by mouth daily., Disp: , Rfl:  .  Cyanocobalamin (VITAMIN B 12 PO), Take by mouth., Disp: , Rfl:  .  Eyelid Cleansers (AVENOVA) 0.01 % SOLN, Apply topically daily., Disp: , Rfl:  .  folic acid (FOLVITE) 1 MG tablet, Take 2 tablets (2 mg total) by mouth daily., Disp: 180 tablet, Rfl: 3 .  glucosamine-chondroitin 500-400 MG tablet, Take 2 tablets by mouth every morning. , Disp: , Rfl:  .  Homeopathic Products (STRESS/EXHAUSTION RELIEF SL), Take 2 capsules by mouth at bedtime., Disp: , Rfl:  .  Lactobacillus  ([employer]S ACIDOPHILUS PO), Take by mouth daily., Disp: , Rfl:  .  Lifitegrast (XIIDRA) 5 % SOLN, Apply to eye 2 (two) times daily., Disp: , Rfl:  .  loteprednol (LOTEMAX) 0.5 % ophthalmic suspension, 3 (three) times daily., Disp: , Rfl:  .  montelukast (SINGULAIR) 10 MG tablet, Take 1 tablet (10 mg total) by mouth daily. (Patient taking differently: Take 10 mg by mouth as needed. ), Disp: 90 tablet, Rfl: 1 .  Multiple Vitamin (MULTIVITAMIN WITH MINERALS) TABS tablet, Take 1 tablet by mouth every morning., Disp: , Rfl:  .  Nutritional Supplements (QUINOA KALE & HEMP) LIQD, Take by mouth daily., Disp: , Rfl:  .  olmesartan (BENICAR) 40 MG tablet, Take 1 tablet (40 mg total) by mouth daily., Disp: 90 tablet, Rfl: 3 .  omeprazole-sodium bicarbonate (ZEGERID) 40-1100 MG per capsule, Take 1 capsule by mouth daily before breakfast., Disp: , Rfl:  .  OVER THE COUNTER MEDICATION, Take 3 capsules by mouth every morning. OTC herbal supplement for prostate health, Disp: , Rfl:  .  OVER THE COUNTER MEDICATION, Lipobiotol daily, Disp: , Rfl:  .  rivaroxaban (XARELTO) 20 MG TABS tablet, Take 1 tablet (20 mg total) by mouth daily., Disp: 90 tablet, Rfl: 1  Allergies:  Allergies  Allergen  Reactions  . Oxycodone-Acetaminophen Other (See Comments)    Other Reaction: agitation  . Lamisil [Terbinafine] Rash    Past Medical History, Surgical history, Social history, and Family History were reviewed and updated.  Review of Systems: Review of Systems  Constitutional: Negative.   HENT:  Negative.   Eyes: Negative.   Respiratory: Negative.   Cardiovascular: Negative.   Gastrointestinal: Negative.   Endocrine: Negative.   Genitourinary: Negative.    Musculoskeletal: Negative.   Skin: Negative.   Neurological: Negative.   Hematological: Negative.   Psychiatric/Behavioral: Negative.     Physical Exam:  weight is 193 lb 4 oz (87.7 kg). His oral temperature is 98.2 F (36.8 C). His blood pressure is 139/85 and  his pulse is 64. His respiration is 19 and oxygen saturation is 96%.   Wt Readings from Last 3 Encounters:  01/11/19 193 lb 4 oz (87.7 kg)  12/29/18 192 lb (87.1 kg)  12/23/18 191 lb 6.4 oz (86.8 kg)    Physical Exam Vitals signs reviewed.  HENT:     Head: Normocephalic and atraumatic.  Eyes:     Pupils: Pupils are equal, round, and reactive to light.  Neck:     Musculoskeletal: Normal range of motion.  Cardiovascular:     Rate and Rhythm: Normal rate and regular rhythm.     Heart sounds: Normal heart sounds.  Pulmonary:     Effort: Pulmonary effort is normal.     Breath sounds: Normal breath sounds.  Abdominal:     General: Bowel sounds are normal.     Palpations: Abdomen is soft.  Musculoskeletal: Normal range of motion.        General: No tenderness or deformity.  Lymphadenopathy:     Cervical: No cervical adenopathy.  Skin:    General: Skin is warm and dry.     Findings: No erythema or rash.  Neurological:     Mental Status: He is alert and oriented to person, place, and time.  Psychiatric:        Behavior: Behavior normal.        Thought Content: Thought content normal.        Judgment: Judgment normal.      Lab Results  Component Value Date   WBC 7.8 01/11/2019   HGB 14.6 01/11/2019   HCT 44.3 01/11/2019   MCV 95.1 01/11/2019   PLT 232 01/11/2019     Chemistry      Component Value Date/Time   NA 137 01/11/2019 1007   K 4.2 01/11/2019 1007   CL 102 01/11/2019 1007   CO2 29 01/11/2019 1007   BUN 15 01/11/2019 1007   CREATININE 0.99 01/11/2019 1007   CREATININE 1.05 10/28/2016 1150      Component Value Date/Time   CALCIUM 9.6 01/11/2019 1007   ALKPHOS 48 01/11/2019 1007   AST 20 01/11/2019 1007   ALT 18 01/11/2019 1007   BILITOT 0.9 01/11/2019 1007       Impression and Plan: Roger Kent is a 73 year old white male.  He had a thrombus in the left leg about 6 years ago.  He now has a thrombus in the right leg.  He is on Xarelto right now.   He will have at least a year of therapeutic Xarelto and then maintenance Xarelto afterwards.  I will go ahead with the baby aspirin.  Maybe this might help with the thrombophlebitis that he has on the Doppler.  I will repeat his Doppler when we see him back.  I would like to see him back in June 2020.  At that time, I will switch him over to maintenance Xarelto 10 mg a day.    Volanda Napoleon, MD 2/24/202010:58 AM

## 2019-01-12 LAB — DRVVT MIX: dRVVT Mix: 58.8 s — ABNORMAL HIGH (ref 0.0–47.0)

## 2019-01-12 LAB — LUPUS ANTICOAGULANT PANEL
DRVVT: 77.1 s — AB (ref 0.0–47.0)
PTT Lupus Anticoagulant: 37.6 s (ref 0.0–51.9)

## 2019-01-12 LAB — DRVVT CONFIRM: dRVVT Confirm: 1.3 ratio — ABNORMAL HIGH (ref 0.8–1.2)

## 2019-02-22 ENCOUNTER — Telehealth: Payer: Self-pay | Admitting: Internal Medicine

## 2019-02-22 MED ORDER — EPINEPHRINE 0.3 MG/0.3ML IJ SOAJ: 0.3000 mg | INTRAMUSCULAR | 3 refills | Status: DC | PRN

## 2019-02-22 NOTE — Telephone Encounter (Signed)
Requested medication (s) are due for refill today: unkown  Requested medication (s) are on the active medication list: No  Last refill:  Unknown  Future visit scheduled: Yes  Notes to clinic:  Not on med list    There are no medications in this encounter.

## 2019-02-22 NOTE — Telephone Encounter (Signed)
Okay to send in rx for epi pen. This is not currently on pt medication list.

## 2019-02-22 NOTE — Telephone Encounter (Signed)
I emailed an EpiPen prescription to [facility].  Please remind Roger Kent to carry Benadryl 25 mg and Sudafed 30 mgwith him and take to reach after he  had to use EpiPen

## 2019-02-22 NOTE — Telephone Encounter (Signed)
Patient is requesting a prescription for a generic  Epi Pen.  Please send refill to [facility] in [place]

## 2019-02-24 NOTE — Telephone Encounter (Signed)
Pt informed rx has been sent and of the additional information.

## 2019-03-08 ENCOUNTER — Inpatient Hospital Stay: Admission: RE | Admit: 2019-03-08 | Payer: Medicare Other | Source: Ambulatory Visit

## 2019-03-31 DIAGNOSIS — H35353 Cystoid macular degeneration, bilateral: Secondary | ICD-10-CM | POA: Diagnosis not present

## 2019-03-31 DIAGNOSIS — H02886 Meibomian gland dysfunction of left eye, unspecified eyelid: Secondary | ICD-10-CM | POA: Diagnosis not present

## 2019-04-07 DIAGNOSIS — M170 Bilateral primary osteoarthritis of knee: Secondary | ICD-10-CM | POA: Diagnosis not present

## 2019-04-07 DIAGNOSIS — M1712 Unilateral primary osteoarthritis, left knee: Secondary | ICD-10-CM | POA: Diagnosis not present

## 2019-04-07 DIAGNOSIS — M25562 Pain in left knee: Secondary | ICD-10-CM | POA: Diagnosis not present

## 2019-04-07 DIAGNOSIS — M25561 Pain in right knee: Secondary | ICD-10-CM | POA: Diagnosis not present

## 2019-04-08 ENCOUNTER — Encounter (INDEPENDENT_AMBULATORY_CARE_PROVIDER_SITE_OTHER): Payer: Medicare Other | Admitting: Ophthalmology

## 2019-04-08 ENCOUNTER — Other Ambulatory Visit: Payer: Self-pay

## 2019-04-08 DIAGNOSIS — H43813 Vitreous degeneration, bilateral: Secondary | ICD-10-CM | POA: Diagnosis not present

## 2019-04-08 DIAGNOSIS — I10 Essential (primary) hypertension: Secondary | ICD-10-CM | POA: Diagnosis not present

## 2019-04-08 DIAGNOSIS — M1711 Unilateral primary osteoarthritis, right knee: Secondary | ICD-10-CM | POA: Diagnosis not present

## 2019-04-08 DIAGNOSIS — M25561 Pain in right knee: Secondary | ICD-10-CM | POA: Diagnosis not present

## 2019-04-08 DIAGNOSIS — H338 Other retinal detachments: Secondary | ICD-10-CM | POA: Diagnosis not present

## 2019-04-08 DIAGNOSIS — H59031 Cystoid macular edema following cataract surgery, right eye: Secondary | ICD-10-CM | POA: Diagnosis not present

## 2019-04-08 DIAGNOSIS — H35033 Hypertensive retinopathy, bilateral: Secondary | ICD-10-CM | POA: Diagnosis not present

## 2019-04-14 ENCOUNTER — Encounter: Payer: Self-pay | Admitting: Internal Medicine

## 2019-04-14 ENCOUNTER — Ambulatory Visit (INDEPENDENT_AMBULATORY_CARE_PROVIDER_SITE_OTHER): Payer: Medicare Other | Admitting: Internal Medicine

## 2019-04-14 ENCOUNTER — Other Ambulatory Visit: Payer: Self-pay

## 2019-04-14 VITALS — BP 132/78 | HR 64 | Temp 97.9°F | Ht 73.0 in | Wt 198.0 lb

## 2019-04-14 DIAGNOSIS — I82509 Chronic embolism and thrombosis of unspecified deep veins of unspecified lower extremity: Secondary | ICD-10-CM | POA: Diagnosis not present

## 2019-04-14 DIAGNOSIS — R350 Frequency of micturition: Secondary | ICD-10-CM | POA: Diagnosis not present

## 2019-04-14 DIAGNOSIS — J301 Allergic rhinitis due to pollen: Secondary | ICD-10-CM | POA: Diagnosis not present

## 2019-04-14 DIAGNOSIS — N401 Enlarged prostate with lower urinary tract symptoms: Secondary | ICD-10-CM | POA: Diagnosis not present

## 2019-04-14 DIAGNOSIS — I10 Essential (primary) hypertension: Secondary | ICD-10-CM

## 2019-04-14 NOTE — Assessment & Plan Note (Signed)
Claritin 10 mg/d

## 2019-04-14 NOTE — Assessment & Plan Note (Signed)
F/u w/Dr Ottelin 

## 2019-04-14 NOTE — Assessment & Plan Note (Signed)
F/u w/Dr Marin Olp Xarelto 10 mg/d

## 2019-04-14 NOTE — Assessment & Plan Note (Signed)
On Benicar, HCT

## 2019-04-14 NOTE — Progress Notes (Signed)
Subjective:  Patient ID: Roger Kent, male    DOB: Apr 12, 1946  Age: 73 y.o. MRN: [id number]  CC: No chief complaint on file.   HPI Roger Kent presents for allergies, B12 def, anticoagulation f/u  Outpatient Medications Prior to Visit  Medication Sig Dispense Refill  . Chlorpheniramine Maleate (ALLERGY PO) Take by mouth.    . cholecalciferol (VITAMIN D) 1000 UNITS tablet Take 1,000 Units by mouth every morning.    Marland Kitchen CRANBERRY PO Take 1 capsule by mouth daily.    . Cyanocobalamin (VITAMIN B 12 PO) Take by mouth.    . EPINEPHrine (EPIPEN 2-PAK) 0.3 mg/0.3 mL IJ SOAJ injection Inject 0.3 mLs (0.3 mg total) into the muscle as needed for anaphylaxis. 1 Device 3  . Eyelid Cleansers (AVENOVA) 0.01 % SOLN Apply topically daily.    . folic acid (FOLVITE) 1 MG tablet Take 2 tablets (2 mg total) by mouth daily. 180 tablet 3  . glucosamine-chondroitin 500-400 MG tablet Take 2 tablets by mouth every morning.     . Homeopathic Products (STRESS/EXHAUSTION RELIEF SL) Take 2 capsules by mouth at bedtime.    . Lactobacillus (CVS ACIDOPHILUS PO) Take by mouth daily.    Marland Kitchen Lifitegrast (XIIDRA) 5 % SOLN Apply to eye 2 (two) times daily.    Marland Kitchen loteprednol (LOTEMAX) 0.5 % ophthalmic suspension 3 (three) times daily.    . Multiple Vitamin (MULTIVITAMIN WITH MINERALS) TABS tablet Take 1 tablet by mouth every morning.    . Nutritional Supplements (QUINOA KALE & HEMP) LIQD Take by mouth daily.    Marland Kitchen olmesartan (BENICAR) 40 MG tablet Take 1 tablet (40 mg total) by mouth daily. 90 tablet 3  . omeprazole-sodium bicarbonate (ZEGERID) 40-1100 MG per capsule Take 1 capsule by mouth daily before breakfast.    . OVER THE COUNTER MEDICATION Take 3 capsules by mouth every morning. OTC herbal supplement for prostate health    . OVER THE COUNTER MEDICATION Lipobiotol daily    . rivaroxaban (XARELTO) 20 MG TABS tablet Take 1 tablet (20 mg total) by mouth daily. 90 tablet 1  . montelukast (SINGULAIR) 10 MG tablet  Take 1 tablet (10 mg total) by mouth daily. (Patient taking differently: Take 10 mg by mouth as needed. ) 90 tablet 1   No facility-administered medications prior to visit.     ROS: Review of Systems  Constitutional: Negative for appetite change, fatigue and unexpected weight change.  HENT: Negative for congestion, nosebleeds, sneezing, sore throat and trouble swallowing.   Eyes: Positive for visual disturbance. Negative for itching.  Respiratory: Negative for cough.   Cardiovascular: Negative for chest pain, palpitations and leg swelling.  Gastrointestinal: Negative for abdominal distention, blood in stool, diarrhea and nausea.  Genitourinary: Negative for frequency and hematuria.  Musculoskeletal: Negative for back pain, gait problem, joint swelling and neck pain.  Skin: Negative for rash.  Neurological: Negative for dizziness, tremors, speech difficulty and weakness.  Psychiatric/Behavioral: Negative for agitation, dysphoric mood, sleep disturbance and suicidal ideas. The patient is not nervous/anxious.     Objective:  BP 132/78 (BP Location: Left Arm, Patient Position: Sitting, Cuff Size: Large)   Pulse 64   Temp 97.9 F (36.6 C) (Oral)   Ht 6\' 1"  (1.854 m)   Wt 198 lb (89.8 kg)   SpO2 97%   BMI 26.12 kg/m   BP Readings from Last 3 Encounters:  04/14/19 132/78  01/11/19 139/85  12/29/18 120/72    Wt Readings from Last 3 Encounters:  04/14/19  198 lb (89.8 kg)  01/11/19 193 lb 4 oz (87.7 kg)  12/29/18 192 lb (87.1 kg)    Physical Exam Constitutional:      General: He is not in acute distress.    Appearance: He is well-developed.     Comments: NAD  Eyes:     Conjunctiva/sclera: Conjunctivae normal.     Pupils: Pupils are equal, round, and reactive to light.  Neck:     Musculoskeletal: Normal range of motion.     Thyroid: No thyromegaly.     Vascular: No JVD.  Cardiovascular:     Rate and Rhythm: Normal rate and regular rhythm.     Heart sounds: Normal heart  sounds. No murmur. No friction rub. No gallop.   Pulmonary:     Effort: Pulmonary effort is normal. No respiratory distress.     Breath sounds: Normal breath sounds. No wheezing or rales.  Chest:     Chest wall: No tenderness.  Abdominal:     General: Bowel sounds are normal. There is no distension.     Palpations: Abdomen is soft. There is no mass.     Tenderness: There is no abdominal tenderness. There is no guarding or rebound.  Musculoskeletal: Normal range of motion.        General: No tenderness.  Lymphadenopathy:     Cervical: No cervical adenopathy.  Skin:    General: Skin is warm and dry.     Findings: No rash.  Neurological:     Mental Status: He is alert and oriented to person, place, and time.     Cranial Nerves: No cranial nerve deficit.     Motor: No abnormal muscle tone.     Coordination: Coordination normal.     Gait: Gait normal.     Deep Tendon Reflexes: Reflexes are normal and symmetric.  Psychiatric:        Behavior: Behavior normal.        Thought Content: Thought content normal.        Judgment: Judgment normal.     Lab Results  Component Value Date   WBC 7.8 01/11/2019   HGB 14.6 01/11/2019   HCT 44.3 01/11/2019   PLT 232 01/11/2019   GLUCOSE 103 (H) 01/11/2019   CHOL 146 08/24/2018   TRIG 109.0 08/24/2018   HDL 43.70 08/24/2018   LDLCALC 81 08/24/2018   ALT 18 01/11/2019   AST 20 01/11/2019   NA 137 01/11/2019   K 4.2 01/11/2019   CL 102 01/11/2019   CREATININE 0.99 01/11/2019   BUN 15 01/11/2019   CO2 29 01/11/2019   TSH 2.37 12/29/2018   PSA 9.86 (H) 08/24/2018   INR 2.4 RATIO (H) 09/17/2007   HGBA1C 5.8 12/29/2018    US Venous Img Lower Bilateral  Result Date: 01/11/2019 CLINICAL DATA:  73 year old male with history swelling and prior left DVT EXAM: BILATERAL LOWER EXTREMITY VENOUS DOPPLER ULTRASOUND TECHNIQUE: Gray-scale sonography with graded compression, as well as color Doppler and duplex ultrasound were performed to evaluate  the lower extremity deep venous systems from the level of the common femoral vein and including the common femoral, femoral, profunda femoral, popliteal and calf veins including the posterior tibial, peroneal and gastrocnemius veins when visible. The superficial great saphenous vein was also interrogated. Spectral Doppler was utilized to evaluate flow at rest and with distal augmentation maneuvers in the common femoral, femoral and popliteal veins. COMPARISON:  None. FINDINGS: RIGHT LOWER EXTREMITY Common Femoral Vein: No evidence of thrombus. Normal compressibility,  respiratory phasicity and response to augmentation. Saphenofemoral Junction: No evidence of thrombus. Normal compressibility and flow on color Doppler imaging. Profunda Femoral Vein: No evidence of thrombus. Normal compressibility and flow on color Doppler imaging. Femoral Vein: No evidence of thrombus. Normal compressibility, respiratory phasicity and response to augmentation. Popliteal Vein: No evidence of thrombus. Normal compressibility, respiratory phasicity and response to augmentation. Calf Veins: No evidence of thrombus. Normal compressibility and flow on color Doppler imaging. Superficial Great Saphenous Vein: Partial occlusive superficial venous thrombus of the great saphenous vein of the leg. Other Findings: Edema lenticular fluid in the popliteal region measures 4.8 cm x 1.7 cm x 4.9 cm. LEFT LOWER EXTREMITY Common Femoral Vein: No evidence of thrombus. Normal compressibility, respiratory phasicity and response to augmentation. Saphenofemoral Junction: No evidence of thrombus. Normal compressibility and flow on color Doppler imaging. Profunda Femoral Vein: No evidence of thrombus. Normal compressibility and flow on color Doppler imaging. Femoral Vein: No evidence of thrombus. Normal compressibility, respiratory phasicity and response to augmentation. Popliteal Vein: Incompletely compressible popliteal vein. Flow maintained. Phasicity  maintained. No central thrombus. Calf Veins: No thrombus of the posterior tibial vein with patency and compressibility maintained. Peroneal vein not visualized. Superficial Great Saphenous Vein: No evidence of thrombus. Normal compressibility and flow on color Doppler imaging. Other Findings: Edema. Lenticular fluid in the popliteal region measures 6.5 cm x 2.4 cm x 3.1 cm. IMPRESSION: Sonographic survey of the bilateral lower extremities negative for DVT. Chronic changes of the left popliteal vein in the setting of prior DVT. Peroneal vein not visualized, potentially chronically thrombosed. Superficial thrombophlebitis of the right great saphenous vein and small saphenous vein. Bilateral Baker's cyst Electronically Signed   By: Corrie Mckusick D.O.   On: 01/11/2019 10:39    Assessment & Plan:   There are no diagnoses linked to this encounter.   No orders of the defined types were placed in this encounter.    Follow-up: No follow-ups on file.  Walker Kehr, MD

## 2019-04-15 DIAGNOSIS — M1712 Unilateral primary osteoarthritis, left knee: Secondary | ICD-10-CM | POA: Diagnosis not present

## 2019-04-15 DIAGNOSIS — M25562 Pain in left knee: Secondary | ICD-10-CM | POA: Diagnosis not present

## 2019-04-16 DIAGNOSIS — M1711 Unilateral primary osteoarthritis, right knee: Secondary | ICD-10-CM | POA: Diagnosis not present

## 2019-04-16 DIAGNOSIS — M25561 Pain in right knee: Secondary | ICD-10-CM | POA: Diagnosis not present

## 2019-05-03 ENCOUNTER — Telehealth: Payer: Self-pay | Admitting: *Deleted

## 2019-05-03 NOTE — Telephone Encounter (Signed)
Script Screening patients for COVID-19 and reviewing new operational procedures  Greeting - The reason I am calling is to share with you some new changes to our processes that are designed to help [place] keep everyone safe. Is now a good time to speak with you? Patient says "no' - ask them when you can call back and let them know it's important to do this prior to their appointment.  Patient says "yes" - Roger Kent, Roger Kent the first thing I need to do is ask you some screening Questions.  1. To the best of your knowledge, have you been in close contact with any one with a confirmed diagnosis of COVID 19? o No - proceed to next question  2. Have you had any one or more of the following: fever, chills, cough, shortness of breath or any flu-like symptoms? o No - proceed to next question  3. Have you been diagnosed with or have a previous diagnosis of COVID 19? o No - proceed to next question  4. I am going to go over a few other symptoms with you. Please let me know if you are experiencing any of the following: . Ear, nose or throat discomfort . A sore throat . Headache . Muscle pain . Diarrhea . Loss of taste or smell o No - proceed to next question  Thank you for answering these questions. Please know we will ask you these questions or similar questions when you arrive for your appointment and again it's how we are keeping everyone safe. Also, to keep you safe, please use the provided hand sanitizer when you enter the building. [name], we are asking everyone in the building to wear a mask because they help [place] prevent the spread of germs. Do you have a mask of your own, if not, we are happy to provide one for you. The last thing I want to go over with you is the no visitor guidelines. This means no one can attend the appointment with you unless you need physical assistance. I understand this may be different from your past appointments and I know this may be difficult but please know if  someone is driving you we are happy to call them for you once your appointment is over.  [INSERT SITE SPECIFIC CHECK IN [place] Patient aware of cost of CT CA Score $150.00.  [name] given you a lot of information, what questions do you have about what I've talked about today or your appointment tomorrow?

## 2019-05-04 ENCOUNTER — Ambulatory Visit (INDEPENDENT_AMBULATORY_CARE_PROVIDER_SITE_OTHER)
Admission: RE | Admit: 2019-05-04 | Discharge: 2019-05-04 | Disposition: A | Discharge status: Home / Self Care | Payer: Self-pay | Source: Ambulatory Visit | Attending: Internal Medicine | Admitting: Internal Medicine

## 2019-05-04 ENCOUNTER — Other Ambulatory Visit: Payer: Self-pay

## 2019-05-04 DIAGNOSIS — E785 Hyperlipidemia, unspecified: Secondary | ICD-10-CM

## 2019-05-06 ENCOUNTER — Other Ambulatory Visit: Payer: Self-pay | Admitting: Internal Medicine

## 2019-05-06 DIAGNOSIS — E785 Hyperlipidemia, unspecified: Secondary | ICD-10-CM

## 2019-05-06 MED ORDER — ROSUVASTATIN CALCIUM 5 MG PO TABS: 5.0000 mg | ORAL_TABLET | Freq: Every day | ORAL | 3 refills | Status: DC

## 2019-05-07 ENCOUNTER — Telehealth: Payer: Self-pay | Admitting: *Deleted

## 2019-05-07 DIAGNOSIS — R931 Abnormal findings on diagnostic imaging of heart and coronary circulation: Secondary | ICD-10-CM

## 2019-05-07 DIAGNOSIS — E7800 Pure hypercholesterolemia, unspecified: Secondary | ICD-10-CM

## 2019-05-07 NOTE — Telephone Encounter (Signed)
Follow up ° ° °Patient is returning your call. Please call. ° ° ° °

## 2019-05-07 NOTE — Telephone Encounter (Addendum)
Left message for pt to call   ----- Message from Lelon Perla, MD sent at 05/06/2019  8:18 AM EDT ----- FU CTA of thoracic aorta 1 year; arrange lexiscan myoview for elevated Ca score; continue crestor and have lipids and liver forwarded once performed; no ASA given xarelto use Kirk Ruths

## 2019-05-10 NOTE — Telephone Encounter (Signed)
Follow up ° ° °Patient is returning your call. Please call. ° ° ° °

## 2019-05-10 NOTE — Telephone Encounter (Signed)
Left message for pt to call.

## 2019-05-11 ENCOUNTER — Inpatient Hospital Stay: Payer: Medicare Other | Attending: Hematology & Oncology | Admitting: Hematology & Oncology

## 2019-05-11 ENCOUNTER — Encounter: Payer: Self-pay | Admitting: Hematology & Oncology

## 2019-05-11 ENCOUNTER — Other Ambulatory Visit: Payer: Self-pay

## 2019-05-11 ENCOUNTER — Ambulatory Visit (HOSPITAL_BASED_OUTPATIENT_CLINIC_OR_DEPARTMENT_OTHER)
Admission: RE | Admit: 2019-05-11 | Discharge: 2019-05-11 | Disposition: A | Discharge status: Home / Self Care | Payer: Medicare Other | Source: Ambulatory Visit | Attending: Hematology & Oncology | Admitting: Hematology & Oncology

## 2019-05-11 ENCOUNTER — Inpatient Hospital Stay: Payer: Medicare Other

## 2019-05-11 VITALS — BP 147/78 | HR 70 | Temp 98.1°F | Resp 15 | Wt 196.0 lb

## 2019-05-11 DIAGNOSIS — D6862 Lupus anticoagulant syndrome: Secondary | ICD-10-CM | POA: Diagnosis not present

## 2019-05-11 DIAGNOSIS — I82451 Acute embolism and thrombosis of right peroneal vein: Secondary | ICD-10-CM | POA: Insufficient documentation

## 2019-05-11 DIAGNOSIS — I82509 Chronic embolism and thrombosis of unspecified deep veins of unspecified lower extremity: Secondary | ICD-10-CM

## 2019-05-11 DIAGNOSIS — Z7901 Long term (current) use of anticoagulants: Secondary | ICD-10-CM

## 2019-05-11 DIAGNOSIS — R600 Localized edema: Secondary | ICD-10-CM | POA: Diagnosis not present

## 2019-05-11 LAB — CBC WITH DIFFERENTIAL (CANCER CENTER ONLY)
Abs Immature Granulocytes: 0.02 10*3/uL (ref 0.00–0.07)
Basophils Absolute: 0 10*3/uL (ref 0.0–0.1)
Basophils Relative: 0 %
Eosinophils Absolute: 0.2 10*3/uL (ref 0.0–0.5)
Eosinophils Relative: 2 %
HCT: 44.5 % (ref 39.0–52.0)
Hemoglobin: 14.4 g/dL (ref 13.0–17.0)
Immature Granulocytes: 0 %
Lymphocytes Relative: 33 %
Lymphs Abs: 2.6 10*3/uL (ref 0.7–4.0)
MCH: 30.6 pg (ref 26.0–34.0)
MCHC: 32.4 g/dL (ref 30.0–36.0)
MCV: 94.7 fL (ref 80.0–100.0)
Monocytes Absolute: 0.9 10*3/uL (ref 0.1–1.0)
Monocytes Relative: 11 %
Neutro Abs: 4.3 10*3/uL (ref 1.7–7.7)
Neutrophils Relative %: 54 %
Platelet Count: 222 10*3/uL (ref 150–400)
RBC: 4.7 MIL/uL (ref 4.22–5.81)
RDW: 13.7 % (ref 11.5–15.5)
WBC Count: 8 10*3/uL (ref 4.0–10.5)
nRBC: 0 % (ref 0.0–0.2)

## 2019-05-11 LAB — CMP (CANCER CENTER ONLY)
ALT: 15 U/L (ref 0–44)
AST: 18 U/L (ref 15–41)
Albumin: 4.6 g/dL (ref 3.5–5.0)
Alkaline Phosphatase: 49 U/L (ref 38–126)
Anion gap: 7 (ref 5–15)
BUN: 15 mg/dL (ref 8–23)
CO2: 27 mmol/L (ref 22–32)
Calcium: 9.5 mg/dL (ref 8.9–10.3)
Chloride: 103 mmol/L (ref 98–111)
Creatinine: 1.04 mg/dL (ref 0.61–1.24)
GFR, Est AFR Am: >60 mL/min (ref >60)
GFR, Estimated: >60 mL/min (ref >60)
Glucose, Bld: 104 mg/dL — ABNORMAL HIGH (ref 70–99)
Potassium: 4.7 mmol/L (ref 3.5–5.1)
Sodium: 137 mmol/L (ref 135–145)
Total Bilirubin: 0.7 mg/dL (ref 0.3–1.2)
Total Protein: 7.4 g/dL (ref 6.5–8.1)

## 2019-05-11 MED ORDER — RIVAROXABAN 10 MG PO TABS: 10.0000 mg | ORAL_TABLET | Freq: Every day | ORAL | 3 refills | Status: DC

## 2019-05-11 NOTE — Telephone Encounter (Signed)
Spoke with pt, aware of cta results, lexiscan scheduled.

## 2019-05-11 NOTE — Progress Notes (Signed)
Hematology and Oncology Follow Up Visit  SHERREL SHAFER [id number] [birth year] 73 y.o. 05/11/2019   Principle Diagnosis:   DVT of the right peroneal vein  SVT of the right saphenous vein  (+) lupus anti-coagulant  Current Therapy:    Xarelto 20 mg po q day -- 1 yr of therapeutic to complete in 05/2019  Xarelto 10 mg po q day -- maintenance x 53yr -- start 05/2019  EC ASA 81 mg po q day -- started on 01/11/2019     Interim History:  Mr. Hoback is back for follow-up.  So far, he has been doing fairly well with the Xarelto.  We will now decrease his Xarelto to maintenance dose of 10 mg p.o. daily for 1 year.  We went ahead and repeated his Doppler of the legs.  There is no thrombus in the right leg.  I think there was noted to be a right distal GSV superficial thrombus at the ankle.  He has the chronic DVT without occlusion in the left distal femoral vein and popliteal veins.  He does wear compression stockings on occasion.  He is having knee issues.  He is known to injection of platelet rich plasma.  I have no problems with this.  I need to make sure that the Xarelto is probably stopped a couple days before he has the injection.  He also had a cardiac CT scan done.  This showed moderate atherosclerosis in the coronary arteries.  He is now on Crestor.  I will see a problem with him being on Crestor.  There is been no issues with bleeding.  Has had no change in bowel or bladder habits.  No chest wall pain.  He has had no dyspepsia.  The exercise but with the coronavirus, he has not been able to do as much.  Overall, his performance status is [name].  Medications:  Current Outpatient Medications:  .  Chlorpheniramine Maleate (ALLERGY PO), Take by mouth., Disp: , Rfl:  .  cholecalciferol (VITAMIN D) 1000 UNITS tablet, Take 1,000 Units by mouth every morning., Disp: , Rfl:  .  CRANBERRY PO, Take 1 capsule by mouth daily., Disp: , Rfl:  .  Cyanocobalamin (VITAMIN B 12  PO), Take by mouth., Disp: , Rfl:  .  EPINEPHrine (EPIPEN 2-PAK) 0.3 mg/0.3 mL IJ SOAJ injection, Inject 0.3 mLs (0.3 mg total) into the muscle as needed for anaphylaxis., Disp: 1 Device, Rfl: 3 .  Eyelid Cleansers (AVENOVA) 0.01 % SOLN, Apply topically daily., Disp: , Rfl:  .  folic acid (FOLVITE) 1 MG tablet, Take 2 tablets (2 mg total) by mouth daily., Disp: 180 tablet, Rfl: 3 .  glucosamine-chondroitin 500-400 MG tablet, Take 2 tablets by mouth every morning. , Disp: , Rfl:  .  Homeopathic Products (STRESS/EXHAUSTION RELIEF SL), Take 2 capsules by mouth at bedtime., Disp: , Rfl:  .  Lactobacillus ([employer] ACIDOPHILUS PO), Take by mouth daily., Disp: , Rfl:  .  Lifitegrast (XIIDRA) 5 % SOLN, Apply to eye 2 (two) times daily., Disp: , Rfl:  .  loteprednol (LOTEMAX) 0.5 % ophthalmic suspension, 3 (three) times daily., Disp: , Rfl:  .  Multiple Vitamin (MULTIVITAMIN WITH MINERALS) TABS tablet, Take 1 tablet by mouth every morning., Disp: , Rfl:  .  Nutritional Supplements (QUINOA KALE & HEMP) LIQD, Take by mouth daily., Disp: , Rfl:  .  olmesartan (BENICAR) 40 MG tablet, Take 1 tablet (40 mg total) by mouth daily., Disp: 90 tablet, Rfl: 3 .  omeprazole-sodium  bicarbonate (ZEGERID) 40-1100 MG per capsule, Take 1 capsule by mouth daily before breakfast., Disp: , Rfl:  .  OVER THE COUNTER MEDICATION, Take 3 capsules by mouth every morning. OTC herbal supplement for prostate health, Disp: , Rfl:  .  OVER THE COUNTER MEDICATION, Lipobiotol daily, Disp: , Rfl:  .  rivaroxaban (XARELTO) 20 MG TABS tablet, Take 1 tablet (20 mg total) by mouth daily., Disp: 90 tablet, Rfl: 1 .  rosuvastatin (CRESTOR) 5 MG tablet, Take 1 tablet (5 mg total) by mouth daily., Disp: 90 tablet, Rfl: 3  Allergies:  Allergies  Allergen Reactions  . Oxycodone-Acetaminophen Other (See Comments)    Other Reaction: agitation  . Lamisil [Terbinafine] Rash    Past Medical History, Surgical history, Social history, and Family History  were reviewed and updated.  Review of Systems: Review of Systems  Constitutional: Negative.   HENT:  Negative.   Eyes: Negative.   Respiratory: Negative.   Cardiovascular: Negative.   Gastrointestinal: Negative.   Endocrine: Negative.   Genitourinary: Negative.    Musculoskeletal: Negative.   Skin: Negative.   Neurological: Negative.   Hematological: Negative.   Psychiatric/Behavioral: Negative.     Physical Exam:  weight is 196 lb (88.9 kg). His oral temperature is 98.1 F (36.7 C). His blood pressure is 147/78 (abnormal) and his pulse is 70. His respiration is 15 and oxygen saturation is 96%.   Wt Readings from Last 3 Encounters:  05/11/19 196 lb (88.9 kg)  04/14/19 198 lb (89.8 kg)  01/11/19 193 lb 4 oz (87.7 kg)    Physical Exam Vitals signs reviewed.  HENT:     Head: Normocephalic and atraumatic.  Eyes:     Pupils: Pupils are equal, round, and reactive to light.  Neck:     Musculoskeletal: Normal range of motion.  Cardiovascular:     Rate and Rhythm: Normal rate and regular rhythm.     Heart sounds: Normal heart sounds.  Pulmonary:     Effort: Pulmonary effort is normal.     Breath sounds: Normal breath sounds.  Abdominal:     General: Bowel sounds are normal.     Palpations: Abdomen is soft.  Musculoskeletal: Normal range of motion.        General: No tenderness or deformity.  Lymphadenopathy:     Cervical: No cervical adenopathy.  Skin:    General: Skin is warm and dry.     Findings: No erythema or rash.  Neurological:     Mental Status: He is alert and oriented to person, place, and time.  Psychiatric:        Behavior: Behavior normal.        Thought Content: Thought content normal.        Judgment: Judgment normal.      Lab Results  Component Value Date   WBC 8.0 05/11/2019   HGB 14.4 05/11/2019   HCT 44.5 05/11/2019   MCV 94.7 05/11/2019   PLT 222 05/11/2019     Chemistry      Component Value Date/Time   NA 137 05/11/2019 0958   K  4.7 05/11/2019 0958   CL 103 05/11/2019 0958   CO2 27 05/11/2019 0958   BUN 15 05/11/2019 0958   CREATININE 1.04 05/11/2019 0958   CREATININE 1.05 10/28/2016 1150      Component Value Date/Time   CALCIUM 9.5 05/11/2019 0958   ALKPHOS 49 05/11/2019 0958   AST 18 05/11/2019 0958   ALT 15 05/11/2019 0958   BILITOT  0.7 05/11/2019 0958       Impression and Plan: Mr. Czaja is a 73 year old white male.  He had a thrombus in the left leg about 6 years ago.  I am glad that the Doppler today did not show any acute thrombus in the right leg.  I am not sure what to make of this superficial thrombus in the right GSV.  I really cannot feel anything.  He is not symptomatic.  He will always have the thrombus in the left leg.  This is chronic but not occlusive.  We will see how he does with the maintenance Xarelto.  He is also on baby aspirin.  I will see him back in 3 months.  I am not sure we have to do another Doppler on him.  I spent about 40 minutes with him today.  It does take quite a while with him as he does have a lot of questions.  His wife sent me a letter before I saw him to let me know what has been going on.     Volanda Napoleon, MD 6/23/202011:04 AM

## 2019-05-12 ENCOUNTER — Telehealth (HOSPITAL_COMMUNITY): Payer: Self-pay | Admitting: *Deleted

## 2019-05-12 LAB — CARDIOLIPIN ANTIBODIES, IGG, IGM, IGA
Anticardiolipin IgA: <9 APL U/mL (ref 0–11)
Anticardiolipin IgG: <9 GPL U/mL (ref 0–14)
Anticardiolipin IgM: <9 MPL U/mL (ref 0–12)

## 2019-05-12 NOTE — Telephone Encounter (Signed)
Close encounter 

## 2019-05-13 ENCOUNTER — Ambulatory Visit (HOSPITAL_COMMUNITY)
Admission: RE | Admit: 2019-05-13 | Discharge: 2019-05-13 | Disposition: A | Discharge status: Home / Self Care | Payer: Medicare Other | Source: Ambulatory Visit | Attending: Internal Medicine | Admitting: Internal Medicine

## 2019-05-13 ENCOUNTER — Other Ambulatory Visit: Payer: Self-pay

## 2019-05-13 DIAGNOSIS — Z87891 Personal history of nicotine dependence: Secondary | ICD-10-CM | POA: Insufficient documentation

## 2019-05-13 DIAGNOSIS — R9439 Abnormal result of other cardiovascular function study: Secondary | ICD-10-CM | POA: Diagnosis not present

## 2019-05-13 DIAGNOSIS — R931 Abnormal findings on diagnostic imaging of heart and coronary circulation: Secondary | ICD-10-CM | POA: Diagnosis not present

## 2019-05-13 DIAGNOSIS — K219 Gastro-esophageal reflux disease without esophagitis: Secondary | ICD-10-CM | POA: Diagnosis not present

## 2019-05-13 DIAGNOSIS — I10 Essential (primary) hypertension: Secondary | ICD-10-CM | POA: Diagnosis not present

## 2019-05-13 LAB — MYOCARDIAL PERFUSION IMAGING
LV dias vol: 154 mL (ref 62–150)
LV sys vol: 75 mL
Peak HR: 94 {beats}/min
Rest HR: 67 {beats}/min
SDS: 2
SRS: 2
SSS: 4
TID: 1.05

## 2019-05-13 LAB — LUPUS ANTICOAGULANT PANEL
DRVVT: 164.5 s — ABNORMAL HIGH (ref 0.0–47.0)
PTT Lupus Anticoagulant: 42.6 s (ref 0.0–51.9)

## 2019-05-13 LAB — DRVVT CONFIRM: dRVVT Confirm: 2.1 ratio — ABNORMAL HIGH (ref 0.8–1.2)

## 2019-05-13 LAB — DRVVT MIX: dRVVT Mix: 86.7 s — ABNORMAL HIGH (ref 0.0–47.0)

## 2019-05-13 MED ORDER — TECHNETIUM TC 99M TETROFOSMIN IV KIT
29.4000 | PACK | Freq: Once | INTRAVENOUS | Status: AC | PRN
  Administered 2019-05-13: 29.4 via INTRAVENOUS
  Filled 2019-05-13: qty 30

## 2019-05-13 MED ORDER — TECHNETIUM TC 99M TETROFOSMIN IV KIT
10.2000 | PACK | Freq: Once | INTRAVENOUS | Status: AC | PRN
  Administered 2019-05-13: 10.2 via INTRAVENOUS
  Filled 2019-05-13: qty 11

## 2019-05-13 MED ORDER — REGADENOSON 0.4 MG/5ML IV SOLN
0.4000 mg | Freq: Once | INTRAVENOUS | Status: AC
  Administered 2019-05-13: 0.4 mg via INTRAVENOUS

## 2019-05-14 DIAGNOSIS — C44612 Basal cell carcinoma of skin of right upper limb, including shoulder: Secondary | ICD-10-CM | POA: Diagnosis not present

## 2019-05-14 DIAGNOSIS — D1801 Hemangioma of skin and subcutaneous tissue: Secondary | ICD-10-CM | POA: Diagnosis not present

## 2019-05-14 DIAGNOSIS — L814 Other melanin hyperpigmentation: Secondary | ICD-10-CM | POA: Diagnosis not present

## 2019-05-14 DIAGNOSIS — D225 Melanocytic nevi of trunk: Secondary | ICD-10-CM | POA: Diagnosis not present

## 2019-05-14 DIAGNOSIS — C44519 Basal cell carcinoma of skin of other part of trunk: Secondary | ICD-10-CM | POA: Diagnosis not present

## 2019-05-14 DIAGNOSIS — D485 Neoplasm of uncertain behavior of skin: Secondary | ICD-10-CM | POA: Diagnosis not present

## 2019-06-07 DIAGNOSIS — H04123 Dry eye syndrome of bilateral lacrimal glands: Secondary | ICD-10-CM | POA: Diagnosis not present

## 2019-06-07 DIAGNOSIS — H40013 Open angle with borderline findings, low risk, bilateral: Secondary | ICD-10-CM | POA: Diagnosis not present

## 2019-06-07 DIAGNOSIS — H2589 Other age-related cataract: Secondary | ICD-10-CM | POA: Diagnosis not present

## 2019-06-09 DIAGNOSIS — R972 Elevated prostate specific antigen [PSA]: Secondary | ICD-10-CM | POA: Diagnosis not present

## 2019-06-15 DIAGNOSIS — C44612 Basal cell carcinoma of skin of right upper limb, including shoulder: Secondary | ICD-10-CM | POA: Diagnosis not present

## 2019-06-15 DIAGNOSIS — C44511 Basal cell carcinoma of skin of breast: Secondary | ICD-10-CM | POA: Diagnosis not present

## 2019-06-16 DIAGNOSIS — R3121 Asymptomatic microscopic hematuria: Secondary | ICD-10-CM | POA: Diagnosis not present

## 2019-06-16 DIAGNOSIS — R972 Elevated prostate specific antigen [PSA]: Secondary | ICD-10-CM | POA: Diagnosis not present

## 2019-06-16 DIAGNOSIS — R351 Nocturia: Secondary | ICD-10-CM | POA: Diagnosis not present

## 2019-06-16 DIAGNOSIS — N403 Nodular prostate with lower urinary tract symptoms: Secondary | ICD-10-CM | POA: Diagnosis not present

## 2019-06-24 DIAGNOSIS — R3121 Asymptomatic microscopic hematuria: Secondary | ICD-10-CM | POA: Diagnosis not present

## 2019-06-24 DIAGNOSIS — N200 Calculus of kidney: Secondary | ICD-10-CM | POA: Diagnosis not present

## 2019-07-06 DIAGNOSIS — N200 Calculus of kidney: Secondary | ICD-10-CM | POA: Diagnosis not present

## 2019-07-06 DIAGNOSIS — N281 Cyst of kidney, acquired: Secondary | ICD-10-CM | POA: Diagnosis not present

## 2019-07-06 DIAGNOSIS — R3121 Asymptomatic microscopic hematuria: Secondary | ICD-10-CM | POA: Diagnosis not present

## 2019-07-15 ENCOUNTER — Other Ambulatory Visit (INDEPENDENT_AMBULATORY_CARE_PROVIDER_SITE_OTHER): Payer: Medicare Other

## 2019-07-15 ENCOUNTER — Ambulatory Visit (INDEPENDENT_AMBULATORY_CARE_PROVIDER_SITE_OTHER): Payer: Medicare Other | Admitting: Internal Medicine

## 2019-07-15 ENCOUNTER — Other Ambulatory Visit: Payer: Self-pay

## 2019-07-15 ENCOUNTER — Encounter: Payer: Self-pay | Admitting: Internal Medicine

## 2019-07-15 VITALS — BP 134/80 | HR 87 | Temp 98.5°F | Ht 73.0 in | Wt 194.0 lb

## 2019-07-15 DIAGNOSIS — E785 Hyperlipidemia, unspecified: Secondary | ICD-10-CM

## 2019-07-15 DIAGNOSIS — I2583 Coronary atherosclerosis due to lipid rich plaque: Secondary | ICD-10-CM

## 2019-07-15 DIAGNOSIS — G8929 Other chronic pain: Secondary | ICD-10-CM

## 2019-07-15 DIAGNOSIS — M545 Low back pain: Secondary | ICD-10-CM

## 2019-07-15 DIAGNOSIS — Z23 Encounter for immunization: Secondary | ICD-10-CM

## 2019-07-15 DIAGNOSIS — I2510 Atherosclerotic heart disease of native coronary artery without angina pectoris: Secondary | ICD-10-CM

## 2019-07-15 DIAGNOSIS — N200 Calculus of kidney: Secondary | ICD-10-CM

## 2019-07-15 LAB — LIPID PANEL
Cholesterol: 96 mg/dL (ref 0–200)
HDL: 39.2 mg/dL (ref >39.00)
LDL Cholesterol: 35 mg/dL (ref 0–99)
NonHDL: 56.63
Total CHOL/HDL Ratio: 2
Triglycerides: 106 mg/dL (ref 0.0–149.0)
VLDL: 21.2 mg/dL (ref 0.0–40.0)

## 2019-07-15 LAB — BASIC METABOLIC PANEL
BUN: 18 mg/dL (ref 6–23)
CO2: 26 mEq/L (ref 19–32)
Calcium: 9.1 mg/dL (ref 8.4–10.5)
Chloride: 104 mEq/L (ref 96–112)
Creatinine, Ser: 1 mg/dL (ref 0.40–1.50)
GFR: 73.16 mL/min (ref >60.00)
Glucose, Bld: 91 mg/dL (ref 70–99)
Potassium: 4.3 mEq/L (ref 3.5–5.1)
Sodium: 137 mEq/L (ref 135–145)

## 2019-07-15 LAB — HEPATIC FUNCTION PANEL
ALT: 14 U/L (ref 0–53)
AST: 16 U/L (ref 0–37)
Albumin: 4.3 g/dL (ref 3.5–5.2)
Alkaline Phosphatase: 44 U/L (ref 39–117)
Bilirubin, Direct: 0.2 mg/dL (ref 0.0–0.3)
Total Bilirubin: 0.9 mg/dL (ref 0.2–1.2)
Total Protein: 7.4 g/dL (ref 6.0–8.3)

## 2019-07-15 MED ORDER — ROSUVASTATIN CALCIUM 5 MG PO TABS: 5.0000 mg | ORAL_TABLET | Freq: Every day | ORAL | 3 refills | Status: DC

## 2019-07-15 NOTE — Patient Instructions (Signed)
These suggestions will probably help you to improve your metabolism if you are not overweight and to lose weight if you are overweight: 1.  Reduce your consumption of sugars and starches.  Eliminate high fructose corn syrup from your diet.  Reduce your consumption of processed foods.  For desserts try to have seasonal fruits, berries, nuts, cheeses or dark chocolate with more than 70% cacao. 2.  Do not snack 3.  You do not have to eat breakfast.  If you choose to have breakfast-eat plain greek yogurt, eggs, oatmeal (without sugar) 4.  Drink water, freshly brewed unsweetened tea (green, black or herbal) or coffee.  Do not drink sodas including diet sodas , juices, beverages sweetened with artificial sweeteners. 5.  Reduce your consumption of refined grains. 6.  Avoid protein drinks such as Optifast, Slim fast etc. Eat chicken, fish, meat, dairy and beans for your sources of protein 7.  Natural unprocessed fats like cold pressed virgin olive oil, butter, coconut oil are good for you.  Eat avocados 8.  Increase your consumption of fiber.  Fruits, berries, vegetables, whole grains, flaxseeds, Chia seeds, beans, popcorn, nuts, oatmeal are good sources of fiber 9.  Use vinegar in your diet, i.e. apple cider vinegar, red wine or balsamic vinegar 10.  You can try fasting.  For example you can skip breakfast and lunch every other day (24-hour fast) 11.  Stress reduction, good night sleep, relaxation, meditation, yoga and other physical activity is likely to help you to maintain low weight too. 12.  If you drink alcohol, limit your alcohol intake to no more than 2 drinks a day.   Mediterranean diet is good for you. ([employer] has a typical Mediterranean cuisine menu) The Mediterranean diet is a way of eating based on the traditional cuisine of countries bordering the [place] Sea. While there is no single definition of the Mediterranean diet, it is typically high in vegetables, fruits, whole grains,  beans, nut and seeds, and olive oil. The main components of Mediterranean diet include: . Daily consumption of vegetables, fruits, whole grains and healthy fats  . Weekly intake of fish, poultry, beans and eggs  . Moderate portions of dairy products  . Limited intake of red meat Other important elements of the Mediterranean diet are sharing meals with family and friends, enjoying a glass of red wine and being physically active. Health benefits of a Mediterranean diet: A traditional Mediterranean diet consisting of large quantities of fresh fruits and vegetables, nuts, fish and olive oil-coupled with physical activity-can reduce your risk of serious mental and physical health problems by: Preventing heart disease and strokes. Following a Mediterranean diet limits your intake of refined breads, processed foods, and red meat, and encourages drinking red wine instead of hard liquor-all factors that can help prevent heart disease and stroke. Keeping you agile. If you're an older adult, the nutrients gained with a Mediterranean diet may reduce your risk of developing muscle weakness and other signs of frailty by about 70 percent. Reducing the risk of Alzheimer's. Research suggests that the Mediterranean diet may improve cholesterol, blood sugar levels, and overall blood vessel health, which in turn may reduce your risk of Alzheimer's disease or dementia. Halving the risk of Parkinson's disease. The high levels of antioxidants in the Mediterranean diet can prevent cells from undergoing a damaging process called oxidative stress, thereby cutting the risk of Parkinson's disease in half. Increasing longevity. By reducing your risk of developing heart disease or cancer with the Mediterranean diet,   you're reducing your risk of death at any age by 20%. Protecting against type 2 diabetes. A Mediterranean diet is rich in fiber which digests slowly, prevents huge swings in blood sugar, and can help you maintain a  healthy weight.   

## 2019-07-15 NOTE — Assessment & Plan Note (Addendum)
Crestor, Xarelto  6/20 CT IMPRESSION: Coronary calcium score of 277. This was 75th percentile for age and sex matched control.  Mild ascending aortic aneurysm measuring 4.3cm.  Calcifications noted in the proximal and mid LAD.

## 2019-07-15 NOTE — Assessment & Plan Note (Signed)
S/p Urol eval, cystoscopy, CT

## 2019-07-15 NOTE — Assessment & Plan Note (Signed)
Crestor, Xarelto

## 2019-07-15 NOTE — Assessment & Plan Note (Signed)
OA discussed 

## 2019-07-15 NOTE — Progress Notes (Signed)
Subjective:  Patient ID: Roger Kent, male    DOB: December 09, 1945  Age: 73 y.o. MRN: [id number]  CC: No chief complaint on file.   HPI Roger Kent presents for CAD, dyslipidemia, renal stones f/u On Crestor S/p Urol eval, cystoscopy, CT - Dr Karsten Ro  Outpatient Medications Prior to Visit  Medication Sig Dispense Refill   Chlorpheniramine Maleate (ALLERGY PO) Take by mouth.     cholecalciferol (VITAMIN D) 1000 UNITS tablet Take 1,000 Units by mouth every morning.     CRANBERRY PO Take 1 capsule by mouth daily.     Cyanocobalamin (VITAMIN B 12 PO) Take by mouth.     EPINEPHrine (EPIPEN 2-PAK) 0.3 mg/0.3 mL IJ SOAJ injection Inject 0.3 mLs (0.3 mg total) into the muscle as needed for anaphylaxis. 1 Device 3   Eyelid Cleansers (AVENOVA) 0.01 % SOLN Apply topically daily.     folic acid (FOLVITE) 1 MG tablet Take 2 tablets (2 mg total) by mouth daily. 180 tablet 3   glucosamine-chondroitin 500-400 MG tablet Take 2 tablets by mouth every morning.      Homeopathic Products (STRESS/EXHAUSTION RELIEF SL) Take 2 capsules by mouth at bedtime.     Lactobacillus ([employer]S ACIDOPHILUS PO) Take by mouth daily.     Lifitegrast (XIIDRA) 5 % SOLN Apply to eye 2 (two) times daily.     loteprednol (LOTEMAX) 0.5 % ophthalmic suspension 3 (three) times daily.     Multiple Vitamin (MULTIVITAMIN WITH MINERALS) TABS tablet Take 1 tablet by mouth every morning.     Nutritional Supplements (QUINOA KALE & HEMP) LIQD Take by mouth daily.     olmesartan (BENICAR) 40 MG tablet Take 1 tablet (40 mg total) by mouth daily. 90 tablet 3   omeprazole-sodium bicarbonate (ZEGERID) 40-1100 MG per capsule Take 1 capsule by mouth daily before breakfast.     OVER THE COUNTER MEDICATION Take 3 capsules by mouth every morning. OTC herbal supplement for prostate health     OVER THE COUNTER MEDICATION Lipobiotol daily     rivaroxaban (XARELTO) 10 MG TABS tablet Take 1 tablet (10 mg total) by mouth daily.  90 tablet 3   rosuvastatin (CRESTOR) 5 MG tablet Take 1 tablet (5 mg total) by mouth daily. 90 tablet 3   No facility-administered medications prior to visit.     ROS: Review of Systems  Constitutional: Negative for appetite change, fatigue and unexpected weight change.  HENT: Negative for congestion, nosebleeds, sneezing, sore throat and trouble swallowing.   Eyes: Negative for itching and visual disturbance.  Respiratory: Negative for cough.   Cardiovascular: Negative for chest pain, palpitations and leg swelling.  Gastrointestinal: Negative for abdominal distention, blood in stool, diarrhea and nausea.  Genitourinary: Negative for frequency and hematuria.  Musculoskeletal: Positive for arthralgias. Negative for back pain, gait problem, joint swelling and neck pain.  Skin: Negative for rash.  Neurological: Negative for dizziness, tremors, speech difficulty and weakness.  Psychiatric/Behavioral: Negative for agitation, dysphoric mood and sleep disturbance. The patient is not nervous/anxious.     Objective:  BP 134/80 (BP Location: Left Arm, Patient Position: Sitting, Cuff Size: Large)    Pulse 87    Temp 98.5 F (36.9 C) (Oral)    Ht 6\' 1"  (1.854 m)    Wt 194 lb (88 kg)    SpO2 95%    BMI 25.60 kg/m   BP Readings from Last 3 Encounters:  07/15/19 134/80  05/11/19 (!) 147/78  04/14/19 132/78    Wt Readings  from Last 3 Encounters:  07/15/19 194 lb (88 kg)  05/13/19 196 lb (88.9 kg)  05/11/19 196 lb (88.9 kg)    Physical Exam Constitutional:      General: He is not in acute distress.    Appearance: He is well-developed.     Comments: NAD  Eyes:     Conjunctiva/sclera: Conjunctivae normal.     Pupils: Pupils are equal, round, and reactive to light.  Neck:     Musculoskeletal: Normal range of motion.     Thyroid: No thyromegaly.     Vascular: No JVD.  Cardiovascular:     Rate and Rhythm: Normal rate and regular rhythm.     Heart sounds: Normal heart sounds. No murmur.  No friction rub. No gallop.   Pulmonary:     Effort: Pulmonary effort is normal. No respiratory distress.     Breath sounds: Normal breath sounds. No wheezing or rales.  Chest:     Chest wall: No tenderness.  Abdominal:     General: Bowel sounds are normal. There is no distension.     Palpations: Abdomen is soft. There is no mass.     Tenderness: There is no abdominal tenderness. There is no guarding or rebound.  Musculoskeletal: Normal range of motion.        General: No tenderness.  Lymphadenopathy:     Cervical: No cervical adenopathy.  Skin:    General: Skin is warm and dry.     Findings: No rash.  Neurological:     Mental Status: He is alert and oriented to person, place, and time.     Cranial Nerves: No cranial nerve deficit.     Motor: No abnormal muscle tone.     Coordination: Coordination normal.     Gait: Gait normal.     Deep Tendon Reflexes: Reflexes are normal and symmetric.  Psychiatric:        Behavior: Behavior normal.        Thought Content: Thought content normal.        Judgment: Judgment normal.   OA def  Lab Results  Component Value Date   WBC 8.0 05/11/2019   HGB 14.4 05/11/2019   HCT 44.5 05/11/2019   PLT 222 05/11/2019   GLUCOSE 104 (H) 05/11/2019   CHOL 146 08/24/2018   TRIG 109.0 08/24/2018   HDL 43.70 08/24/2018   LDLCALC 81 08/24/2018   ALT 15 05/11/2019   AST 18 05/11/2019   NA 137 05/11/2019   K 4.7 05/11/2019   CL 103 05/11/2019   CREATININE 1.04 05/11/2019   BUN 15 05/11/2019   CO2 27 05/11/2019   TSH 2.37 12/29/2018   PSA 9.86 (H) 08/24/2018   INR 2.4 RATIO (H) 09/17/2007   HGBA1C 5.8 12/29/2018    No results found.  Assessment & Plan:   Diagnoses and all orders for this visit:  Need for influenza vaccination -     Flu Vaccine QUAD High Dose(Fluad)     No orders of the defined types were placed in this encounter.    Follow-up: No follow-ups on file.  Walker Kehr, MD

## 2019-08-01 ENCOUNTER — Other Ambulatory Visit: Payer: Self-pay | Admitting: Internal Medicine

## 2019-08-01 DIAGNOSIS — I2510 Atherosclerotic heart disease of native coronary artery without angina pectoris: Secondary | ICD-10-CM

## 2019-08-01 DIAGNOSIS — I2583 Coronary atherosclerosis due to lipid rich plaque: Secondary | ICD-10-CM

## 2019-08-04 ENCOUNTER — Telehealth: Payer: Self-pay | Admitting: Cardiology

## 2019-08-04 NOTE — Telephone Encounter (Signed)
Called patient to schedule him as a new patient, but, after looking at his chart I saw that he has seen Dr. Stanford Breed as recently as [phone]. The patient states that Dr. Alain Marion wanted him to be seen as he has had a reaction to his cholesterol medications.  I told the patient that the first available appt for Dr. Stanford Breed is December 2020.  I will send a msg to Dr. Alain Marion and also to Hilda Blades (Dr. Jacalyn Lefevre) nurse to decide on the next step.

## 2019-08-10 ENCOUNTER — Inpatient Hospital Stay: Payer: Medicare Other | Attending: Hematology & Oncology | Admitting: Hematology & Oncology

## 2019-08-10 ENCOUNTER — Inpatient Hospital Stay: Payer: Medicare Other

## 2019-08-10 ENCOUNTER — Telehealth: Payer: Self-pay | Admitting: Hematology & Oncology

## 2019-08-10 ENCOUNTER — Other Ambulatory Visit: Payer: Self-pay

## 2019-08-10 ENCOUNTER — Encounter: Payer: Self-pay | Admitting: Hematology & Oncology

## 2019-08-10 VITALS — BP 121/77 | HR 84 | Temp 97.9°F | Wt 191.0 lb

## 2019-08-10 DIAGNOSIS — I2510 Atherosclerotic heart disease of native coronary artery without angina pectoris: Secondary | ICD-10-CM

## 2019-08-10 DIAGNOSIS — I82451 Acute embolism and thrombosis of right peroneal vein: Secondary | ICD-10-CM | POA: Diagnosis not present

## 2019-08-10 DIAGNOSIS — I2583 Coronary atherosclerosis due to lipid rich plaque: Secondary | ICD-10-CM | POA: Diagnosis not present

## 2019-08-10 DIAGNOSIS — I82509 Chronic embolism and thrombosis of unspecified deep veins of unspecified lower extremity: Secondary | ICD-10-CM | POA: Diagnosis not present

## 2019-08-10 LAB — CBC WITH DIFFERENTIAL (CANCER CENTER ONLY)
Abs Immature Granulocytes: 0.04 10*3/uL (ref 0.00–0.07)
Basophils Absolute: 0.1 10*3/uL (ref 0.0–0.1)
Basophils Relative: 0 %
Eosinophils Absolute: 0.1 10*3/uL (ref 0.0–0.5)
Eosinophils Relative: 1 %
HCT: 43.8 % (ref 39.0–52.0)
Hemoglobin: 14.5 g/dL (ref 13.0–17.0)
Immature Granulocytes: 0 %
Lymphocytes Relative: 18 %
Lymphs Abs: 2.1 10*3/uL (ref 0.7–4.0)
MCH: 30.7 pg (ref 26.0–34.0)
MCHC: 33.1 g/dL (ref 30.0–36.0)
MCV: 92.8 fL (ref 80.0–100.0)
Monocytes Absolute: 1.2 10*3/uL — ABNORMAL HIGH (ref 0.1–1.0)
Monocytes Relative: 10 %
Neutro Abs: 8.4 10*3/uL — ABNORMAL HIGH (ref 1.7–7.7)
Neutrophils Relative %: 71 %
Platelet Count: 246 10*3/uL (ref 150–400)
RBC: 4.72 MIL/uL (ref 4.22–5.81)
RDW: 13.7 % (ref 11.5–15.5)
WBC Count: 11.9 10*3/uL — ABNORMAL HIGH (ref 4.0–10.5)
nRBC: 0 % (ref 0.0–0.2)

## 2019-08-10 LAB — CMP (CANCER CENTER ONLY)
ALT: 16 U/L (ref 0–44)
AST: 17 U/L (ref 15–41)
Albumin: 4.5 g/dL (ref 3.5–5.0)
Alkaline Phosphatase: 58 U/L (ref 38–126)
Anion gap: 8 (ref 5–15)
BUN: 17 mg/dL (ref 8–23)
CO2: 28 mmol/L (ref 22–32)
Calcium: 9.8 mg/dL (ref 8.9–10.3)
Chloride: 100 mmol/L (ref 98–111)
Creatinine: 1.01 mg/dL (ref 0.61–1.24)
GFR, Est AFR Am: >60 mL/min (ref >60)
GFR, Estimated: >60 mL/min (ref >60)
Glucose, Bld: 109 mg/dL — ABNORMAL HIGH (ref 70–99)
Potassium: 4.7 mmol/L (ref 3.5–5.1)
Sodium: 136 mmol/L (ref 135–145)
Total Bilirubin: 0.9 mg/dL (ref 0.3–1.2)
Total Protein: 8.6 g/dL — ABNORMAL HIGH (ref 6.5–8.1)

## 2019-08-10 NOTE — Telephone Encounter (Signed)
lmom to inform patient of appt 1/19 at 10 am per 9/22 LOS

## 2019-08-11 LAB — CARDIOLIPIN ANTIBODIES, IGG, IGM, IGA
Anticardiolipin IgA: <9 APL U/mL (ref 0–11)
Anticardiolipin IgG: <9 GPL U/mL (ref 0–14)
Anticardiolipin IgM: <9 MPL U/mL (ref 0–12)

## 2019-08-12 ENCOUNTER — Other Ambulatory Visit: Payer: Self-pay | Admitting: Internal Medicine

## 2019-08-12 LAB — DRVVT CONFIRM: dRVVT Confirm: 2.3 ratio — ABNORMAL HIGH (ref 0.8–1.2)

## 2019-08-12 LAB — LUPUS ANTICOAGULANT PANEL
DRVVT: 138.7 s — ABNORMAL HIGH (ref 0.0–47.0)
PTT Lupus Anticoagulant: 37.5 s (ref 0.0–51.9)

## 2019-08-12 LAB — BETA-2-GLYCOPROTEIN I ABS, IGG/M/A
Beta-2 Glyco I IgG: <9 GPI IgG units (ref 0–20)
Beta-2-Glycoprotein I IgA: <9 GPI IgA units (ref 0–25)
Beta-2-Glycoprotein I IgM: <9 GPI IgM units (ref 0–32)

## 2019-08-12 LAB — DRVVT MIX: dRVVT Mix: 71.1 s — ABNORMAL HIGH (ref 0.0–47.0)

## 2019-08-17 ENCOUNTER — Ambulatory Visit: Payer: Medicare Other | Admitting: Cardiology

## 2019-09-13 ENCOUNTER — Ambulatory Visit: Payer: Medicare Other

## 2019-10-11 ENCOUNTER — Encounter (INDEPENDENT_AMBULATORY_CARE_PROVIDER_SITE_OTHER): Payer: Medicare Other | Admitting: Ophthalmology

## 2019-10-19 DIAGNOSIS — H5022 Vertical strabismus, left eye: Secondary | ICD-10-CM | POA: Diagnosis not present

## 2019-10-19 DIAGNOSIS — H04123 Dry eye syndrome of bilateral lacrimal glands: Secondary | ICD-10-CM | POA: Diagnosis not present

## 2019-10-26 ENCOUNTER — Encounter (INDEPENDENT_AMBULATORY_CARE_PROVIDER_SITE_OTHER): Payer: Medicare Other | Admitting: Ophthalmology

## 2019-10-26 DIAGNOSIS — H59031 Cystoid macular edema following cataract surgery, right eye: Secondary | ICD-10-CM | POA: Diagnosis not present

## 2019-10-26 DIAGNOSIS — H35033 Hypertensive retinopathy, bilateral: Secondary | ICD-10-CM

## 2019-10-26 DIAGNOSIS — I10 Essential (primary) hypertension: Secondary | ICD-10-CM | POA: Diagnosis not present

## 2019-10-26 DIAGNOSIS — H43813 Vitreous degeneration, bilateral: Secondary | ICD-10-CM

## 2019-10-26 DIAGNOSIS — H338 Other retinal detachments: Secondary | ICD-10-CM

## 2019-12-06 ENCOUNTER — Encounter (INDEPENDENT_AMBULATORY_CARE_PROVIDER_SITE_OTHER): Payer: Medicare Other | Admitting: Ophthalmology

## 2019-12-07 ENCOUNTER — Encounter: Payer: Self-pay | Admitting: Hematology & Oncology

## 2019-12-07 ENCOUNTER — Inpatient Hospital Stay: Payer: Medicare Other

## 2019-12-07 ENCOUNTER — Telehealth: Payer: Self-pay | Admitting: Hematology & Oncology

## 2019-12-07 ENCOUNTER — Other Ambulatory Visit: Payer: Self-pay

## 2019-12-07 ENCOUNTER — Inpatient Hospital Stay: Payer: Medicare Other | Attending: Hematology & Oncology | Admitting: Hematology & Oncology

## 2019-12-07 VITALS — BP 126/74 | HR 66 | Temp 97.6°F | Resp 18 | Ht 73.0 in | Wt 197.0 lb

## 2019-12-07 DIAGNOSIS — I82509 Chronic embolism and thrombosis of unspecified deep veins of unspecified lower extremity: Secondary | ICD-10-CM

## 2019-12-07 DIAGNOSIS — Z86718 Personal history of other venous thrombosis and embolism: Secondary | ICD-10-CM | POA: Diagnosis not present

## 2019-12-07 DIAGNOSIS — Z7901 Long term (current) use of anticoagulants: Secondary | ICD-10-CM | POA: Diagnosis not present

## 2019-12-07 LAB — CBC WITH DIFFERENTIAL (CANCER CENTER ONLY)
Abs Immature Granulocytes: 0.01 10*3/uL (ref 0.00–0.07)
Basophils Absolute: 0 10*3/uL (ref 0.0–0.1)
Basophils Relative: 1 %
Eosinophils Absolute: 0.2 10*3/uL (ref 0.0–0.5)
Eosinophils Relative: 2 %
HCT: 47.2 % (ref 39.0–52.0)
Hemoglobin: 15.3 g/dL (ref 13.0–17.0)
Immature Granulocytes: 0 %
Lymphocytes Relative: 32 %
Lymphs Abs: 2.8 10*3/uL (ref 0.7–4.0)
MCH: 30.2 pg (ref 26.0–34.0)
MCHC: 32.4 g/dL (ref 30.0–36.0)
MCV: 93.1 fL (ref 80.0–100.0)
Monocytes Absolute: 0.8 10*3/uL (ref 0.1–1.0)
Monocytes Relative: 9 %
Neutro Abs: 4.9 10*3/uL (ref 1.7–7.7)
Neutrophils Relative %: 56 %
Platelet Count: 257 10*3/uL (ref 150–400)
RBC: 5.07 MIL/uL (ref 4.22–5.81)
RDW: 14.1 % (ref 11.5–15.5)
WBC Count: 8.7 10*3/uL (ref 4.0–10.5)
nRBC: 0 % (ref 0.0–0.2)

## 2019-12-07 LAB — CMP (CANCER CENTER ONLY)
ALT: 16 U/L (ref 0–44)
AST: 18 U/L (ref 15–41)
Albumin: 4.6 g/dL (ref 3.5–5.0)
Alkaline Phosphatase: 53 U/L (ref 38–126)
Anion gap: 7 (ref 5–15)
BUN: 17 mg/dL (ref 8–23)
CO2: 29 mmol/L (ref 22–32)
Calcium: 9.9 mg/dL (ref 8.9–10.3)
Chloride: 104 mmol/L (ref 98–111)
Creatinine: 1.07 mg/dL (ref 0.61–1.24)
GFR, Est AFR Am: >60 mL/min (ref >60)
GFR, Estimated: >60 mL/min (ref >60)
Glucose, Bld: 112 mg/dL — ABNORMAL HIGH (ref 70–99)
Potassium: 4.6 mmol/L (ref 3.5–5.1)
Sodium: 140 mmol/L (ref 135–145)
Total Bilirubin: 0.7 mg/dL (ref 0.3–1.2)
Total Protein: 8.2 g/dL — ABNORMAL HIGH (ref 6.5–8.1)

## 2019-12-07 LAB — D-DIMER, QUANTITATIVE: D-Dimer, Quant: 1.85 ug/mL-FEU — ABNORMAL HIGH (ref 0.00–0.50)

## 2019-12-07 NOTE — Progress Notes (Signed)
Hematology and Oncology Follow Up Visit  Roger Kent [id number] Dec 09, 1945 74 y.o. 12/07/2019   Principle Diagnosis:   DVT of the right peroneal vein  SVT of the right saphenous vein  (+) lupus anti-coagulant  Current Therapy:    Xarelto 20 mg po q day -- 1 yr of therapeutic to complete in 05/2019  Xarelto 10 mg po q day -- maintenance x 25yr -- start 05/2019  EC ASA 81 mg po q day -- started on 01/11/2019     Interim History:  [name] is back for follow-up.  Overall, he is doing fairly well.  He really has had no specific complaints since we last saw him.  He apparently was put on a statin drug.  This was because of some calcifications that were found on a cardiac CT scan.  He did not tolerate the statin all that well.  I saw his last cholesterol levels we did not look all that bad.  His cholesterol/HDL ratio was only 2.  He is doing well on the Xarelto.  He is on maintenance Xarelto right now.  He will finish up the 1 year of maintenance Xarelto in July 2021.  I am going to set him up with another Doppler test when we see him back in July so we can see how the thrombus in his right leg is doing.    He has had no bleeding.  There is been no change in bowel or bladder habits.  He has had no cough.  There is no chest wall pain peers no shortness of breath.  He is still having problems with his knees.  He is trying to avoid knee surgery.  Overall, I would say his performance status is ECOG 1.    Medications:  Current Outpatient Medications:  .  Chlorpheniramine Maleate (ALLERGY PO), Take by mouth., Disp: , Rfl:  .  cholecalciferol (VITAMIN D) 1000 UNITS tablet, Take 1,000 Units by mouth every morning., Disp: , Rfl:  .  CRANBERRY PO, Take 1 capsule by mouth daily., Disp: , Rfl:  .  Cyanocobalamin (VITAMIN B 12 PO), Take by mouth., Disp: , Rfl:  .  Eyelid Cleansers (AVENOVA) 0.01 % SOLN, Apply topically daily., Disp: , Rfl:  .  folic acid (FOLVITE) 1 MG tablet,  Take 2 tablets (2 mg total) by mouth daily., Disp: 180 tablet, Rfl: 3 .  glucosamine-chondroitin 500-400 MG tablet, Take 2 tablets by mouth every morning. , Disp: , Rfl:  .  Homeopathic Products (STRESS/EXHAUSTION RELIEF SL), Take 2 capsules by mouth at bedtime., Disp: , Rfl:  .  Lactobacillus (CVS ACIDOPHILUS PO), Take by mouth daily., Disp: , Rfl:  .  Lifitegrast (XIIDRA) 5 % SOLN, Apply to eye 2 (two) times daily., Disp: , Rfl:  .  loteprednol (LOTEMAX) 0.5 % ophthalmic suspension, 3 (three) times daily., Disp: , Rfl:  .  Multiple Vitamin (MULTIVITAMIN WITH MINERALS) TABS tablet, Take 1 tablet by mouth every morning., Disp: , Rfl:  .  Nutritional Supplements (QUINOA KALE & HEMP) LIQD, Take by mouth daily., Disp: , Rfl:  .  olmesartan (BENICAR) 40 MG tablet, TAKE 1 TABLET DAILY, Disp: 90 tablet, Rfl: 3 .  omeprazole-sodium bicarbonate (ZEGERID) 40-1100 MG per capsule, Take 1 capsule by mouth daily before breakfast., Disp: , Rfl:  .  OVER THE COUNTER MEDICATION, Take 3 capsules by mouth every morning. OTC herbal supplement for prostate health, Disp: , Rfl:  .  OVER THE COUNTER MEDICATION, Lipobiotol daily, Disp: , Rfl:  .  rivaroxaban (XARELTO) 10 MG TABS tablet, Take 1 tablet (10 mg total) by mouth daily., Disp: 90 tablet, Rfl: 3 .  EPINEPHrine (EPIPEN 2-PAK) 0.3 mg/0.3 mL IJ SOAJ injection, Inject 0.3 mLs (0.3 mg total) into the muscle as needed for anaphylaxis. (Patient not taking: Reported on 12/07/2019), Disp: 1 Device, Rfl: 3  Allergies:  Allergies  Allergen Reactions  . Oxycodone-Acetaminophen Other (See Comments)    Other Reaction: agitation  . Lamisil [Terbinafine] Rash    Past Medical History, Surgical history, Social history, and Family History were reviewed and updated.  Review of Systems: Review of Systems  Constitutional: Negative.   HENT:  Negative.   Eyes: Negative.   Respiratory: Negative.   Cardiovascular: Negative.   Gastrointestinal: Negative.   Endocrine:  Negative.   Genitourinary: Negative.    Musculoskeletal: Negative.   Skin: Negative.   Neurological: Negative.   Hematological: Negative.   Psychiatric/Behavioral: Negative.     Physical Exam:  height is 6\' 1"  (1.854 m) and weight is 197 lb (89.4 kg). His temporal temperature is 97.6 F (36.4 C). His blood pressure is 126/74 and his pulse is 66. His respiration is 18 and oxygen saturation is 97%.   Wt Readings from Last 3 Encounters:  12/07/19 197 lb (89.4 kg)  08/10/19 191 lb (86.6 kg)  07/15/19 194 lb (88 kg)    Physical Exam Vitals reviewed.  HENT:     Head: Normocephalic and atraumatic.  Eyes:     Pupils: Pupils are equal, round, and reactive to light.  Cardiovascular:     Rate and Rhythm: Normal rate and regular rhythm.     Heart sounds: Normal heart sounds.  Pulmonary:     Effort: Pulmonary effort is normal.     Breath sounds: Normal breath sounds.  Abdominal:     General: Bowel sounds are normal.     Palpations: Abdomen is soft.  Musculoskeletal:        General: No tenderness or deformity. Normal range of motion.     Cervical back: Normal range of motion.  Lymphadenopathy:     Cervical: No cervical adenopathy.  Skin:    General: Skin is warm and dry.     Findings: No erythema or rash.  Neurological:     Mental Status: He is alert and oriented to person, place, and time.  Psychiatric:        Behavior: Behavior normal.        Thought Content: Thought content normal.        Judgment: Judgment normal.      Lab Results  Component Value Date   WBC 8.7 12/07/2019   HGB 15.3 12/07/2019   HCT 47.2 12/07/2019   MCV 93.1 12/07/2019   PLT 257 12/07/2019     Chemistry      Component Value Date/Time   NA 140 12/07/2019 0941   K 4.6 12/07/2019 0941   CL 104 12/07/2019 0941   CO2 29 12/07/2019 0941   BUN 17 12/07/2019 0941   CREATININE 1.07 12/07/2019 0941   CREATININE 1.05 10/28/2016 1150      Component Value Date/Time   CALCIUM 9.9 12/07/2019 0941    ALKPHOS 53 12/07/2019 0941   AST 18 12/07/2019 0941   ALT 16 12/07/2019 0941   BILITOT 0.7 12/07/2019 0941       Impression and Plan: [name] is a 74 year old white male.  He had a thrombus in the left leg about 6 years ago.  I am glad that the Doppler  today did not show any acute thrombus in the right leg.  I am not sure what to make of this superficial thrombus in the right GSV.  I really cannot feel anything.  He is not symptomatic.  Again, we will plan to get him back in 6 months.  At that point time, we may get him off the Xarelto and put him on full dose aspirin.  A lot will depend on what the Doppler shows.  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  He brought in a couple bottles for [place].  I actually tried one of the samples and it was delicious.    Volanda Napoleon, MD 1/19/202111:11 AM

## 2019-12-07 NOTE — Telephone Encounter (Signed)
Appointments scheduled calendar printed per 1/19 los 

## 2019-12-08 ENCOUNTER — Encounter (INDEPENDENT_AMBULATORY_CARE_PROVIDER_SITE_OTHER): Payer: Medicare Other | Admitting: Ophthalmology

## 2019-12-08 DIAGNOSIS — H59031 Cystoid macular edema following cataract surgery, right eye: Secondary | ICD-10-CM | POA: Diagnosis not present

## 2019-12-08 DIAGNOSIS — H338 Other retinal detachments: Secondary | ICD-10-CM

## 2019-12-08 DIAGNOSIS — H43813 Vitreous degeneration, bilateral: Secondary | ICD-10-CM

## 2019-12-08 DIAGNOSIS — H35033 Hypertensive retinopathy, bilateral: Secondary | ICD-10-CM | POA: Diagnosis not present

## 2019-12-08 DIAGNOSIS — I10 Essential (primary) hypertension: Secondary | ICD-10-CM | POA: Diagnosis not present

## 2019-12-08 NOTE — Progress Notes (Signed)
HPI: FU congenital VSD, and prior DVT. Abdominal ultrasound 1/20 showed dilated iliacs (R 1.7x1.7 and L 1.5x1.6) and abd aorta 2.6 cm.On xarelto for DVT.   Echocardiogram February 2020 showed normal LV systolic function, small membranous ventricular septal defect with left-to-right shunting, mild to moderate left atrial enlargement, normal RV size and function, mild to moderate aortic insufficiency and mildly dilated aortic root at 40 mm.  Calcium score June 2020-277 and mildly dilated ascending aorta at 4.3 cm.  Nuclear study June 2020 showed ejection fraction 51%, small fixed apical defect felt to be artifact and no ischemia.  Since I last saw himthe patient denies any dyspnea on exertion, orthopnea, PND, pedal edema, palpitations, syncope or chest pain.   Current Outpatient Medications  Medication Sig Dispense Refill  . Chlorpheniramine Maleate (ALLERGY PO) Take by mouth.    . cholecalciferol (VITAMIN D) 1000 UNITS tablet Take 1,000 Units by mouth every morning.    . Cyanocobalamin (VITAMIN B 12 PO) Take by mouth.    . EPINEPHrine (EPIPEN 2-PAK) 0.3 mg/0.3 mL IJ SOAJ injection Inject 0.3 mLs (0.3 mg total) into the muscle as needed for anaphylaxis. 1 Device 3  . folic acid (FOLVITE) 1 MG tablet Take 2 tablets (2 mg total) by mouth daily. 180 tablet 3  . glucosamine-chondroitin 500-400 MG tablet Take 2 tablets by mouth every morning.     . Homeopathic Products (STRESS/EXHAUSTION RELIEF SL) Take 2 capsules by mouth at bedtime.    . Lactobacillus (CVS ACIDOPHILUS PO) Take by mouth daily.    Marland Kitchen Lifitegrast (XIIDRA) 5 % SOLN Apply to eye 2 (two) times daily.    Marland Kitchen loteprednol (LOTEMAX) 0.5 % ophthalmic suspension 3 (three) times daily.    . Multiple Vitamin (MULTIVITAMIN WITH MINERALS) TABS tablet Take 1 tablet by mouth every morning.    . Nutritional Supplements (QUINOA KALE & HEMP) LIQD Take by mouth daily.    Marland Kitchen olmesartan (BENICAR) 40 MG tablet TAKE 1 TABLET DAILY 90 tablet 3  .  omeprazole-sodium bicarbonate (ZEGERID) 40-1100 MG per capsule Take 1 capsule by mouth daily before breakfast.    . OVER THE COUNTER MEDICATION Take 3 capsules by mouth every morning. OTC herbal supplement for prostate health    . OVER THE COUNTER MEDICATION Lipobiotol daily    . rivaroxaban (XARELTO) 10 MG TABS tablet Take 1 tablet (10 mg total) by mouth daily. 90 tablet 3   No current facility-administered medications for this visit.     Past Medical History:  Diagnosis Date  . Acute hepatitis C without mention of hepatic coma(070.51)   . Allergic rhinitis, cause unspecified   . Anxiety state, unspecified   . Barrett's esophagus   . BPH (benign prostatic hyperplasia)   . DVT (deep venous thrombosis) ([place])   . Elevated PSA    Dr Karsten Ro  . Esophageal reflux   . Hiatal hernia   . Hyperlipidemia   . Hypertension   . Hypertonicity of bladder   . Insomnia   . Leg DVT (deep venous thromboembolism), chronic, unspecified laterality ([place]) 09/21/2018  . Osteoporosis, unspecified   . Personal history of urinary calculi   . Recent retinal detachment, partial, with giant tear   . Unspecified tinnitus   . Ventricular septal defect     Past Surgical History:  Procedure Laterality Date  . KNEE SURGERY    . RETINAL DETACHMENT SURGERY      Social History   Socioeconomic History  . Marital status: Married  Spouse name: Ode Gollehon  . Number of children: Not on file  . Years of education: Not on file  . Highest education level: Not on file  Occupational History  . 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 Occupation: bee keepin  Tobacco Use  . Smoking status: Former Research scientist (life sciences)  . Smokeless tobacco: Never Used  Substance and Sexual Activity  . Alcohol use: No  . Drug use: No  . Sexual activity: Yes  Other Topics Concern  . Not on file  Social History Narrative               Social Determinants of Health   Financial Resource Strain:   .  Difficulty of Paying Living Expenses: Not on file  Food Insecurity:   . Worried About Charity fundraiser in the Last Year: Not on file  . Ran Out of Food in the Last Year: Not on file  Transportation Needs:   . Lack of Transportation (Medical): Not on file  . Lack of Transportation (Non-Medical): Not on file  Physical Activity:   . Days of Exercise per Week: Not on file  . Minutes of Exercise per Session: Not on file  Stress:   . Feeling of Stress : Not on file  Social Connections:   . Frequency of Communication with Friends and Family: Not on file  . Frequency of Social Gatherings with Friends and Family: Not on file  . Attends Religious Services: Not on file  . Active Member of Clubs or Organizations: Not on file  . Attends Archivist Meetings: Not on file  . Marital Status: Not on file  Intimate Partner Violence:   . Fear of Current or Ex-Partner: Not on file  . Emotionally Abused: Not on file  . Physically Abused: Not on file  . Sexually Abused: Not on file    Family History  Problem Relation Age of Onset  . Hypertension Other   . Alcohol abuse Neg Hx     ROS: no fevers or chills, productive cough, hemoptysis, dysphasia, odynophagia, melena, hematochezia, dysuria, hematuria, rash, seizure activity, orthopnea, PND, pedal edema, claudication. Remaining systems are negative.  Physical Exam: Well-developed well-nourished in no acute distress.  Skin is warm and dry.  HEENT is normal.  Neck is supple.  Chest is clear to auscultation with normal expansion.  Cardiovascular exam is regular rate and rhythm.  Abdominal exam nontender or distended. No masses palpated. Extremities show no edema. neuro grossly intact  ECG-normal sinus rhythm at a rate of 67, no ST changes.  Personally reviewed  A/P  1  Ventricular septal defect-small on prior echocardiogram.  Right side not enlarged.    2 thoracic aortic aneurysm-plan follow-up CTA June 2021.  3 coronary artery  disease-based on elevated calcium score.  Previous nuclear study showed no ischemia.  Plan medical therapy.  4 history of iliac aneurysms-we will arrange follow-up ultrasound.  5 hypertension-blood pressure controlled.  Continue present medications.  6 hyperlipidemia-given documented coronary disease I would like for him to be on a statin long-term.  He recently tried low-dose Crestor but had significant side effects.  We will try pravastatin 40 mg daily.  Check lipids and liver in 12 weeks.  If he does not tolerate we will consider Zetia or Repatha.  7 prior DVT-continue Xarelto.  Managed by oncology.  8 mild to moderate aortic insufficiency-we will repeat echocardiogram 2/21.  Kirk Ruths, MD

## 2019-12-09 LAB — LUPUS ANTICOAGULANT PANEL
DRVVT: 66.5 s — ABNORMAL HIGH (ref 0.0–47.0)
PTT Lupus Anticoagulant: 36.3 s (ref 0.0–51.9)

## 2019-12-09 LAB — DRVVT MIX: dRVVT Mix: 52.9 s — ABNORMAL HIGH (ref 0.0–40.4)

## 2019-12-09 LAB — DRVVT CONFIRM: dRVVT Confirm: 1.3 ratio — ABNORMAL HIGH (ref 0.8–1.2)

## 2019-12-13 ENCOUNTER — Other Ambulatory Visit: Payer: Self-pay | Admitting: *Deleted

## 2019-12-13 ENCOUNTER — Ambulatory Visit (HOSPITAL_COMMUNITY)
Admission: RE | Admit: 2019-12-13 | Discharge: 2019-12-13 | Disposition: A | Discharge status: Home / Self Care | Payer: Medicare Other | Source: Ambulatory Visit | Attending: Cardiology | Admitting: Cardiology

## 2019-12-13 ENCOUNTER — Encounter: Payer: Self-pay | Admitting: *Deleted

## 2019-12-13 ENCOUNTER — Encounter: Payer: Self-pay | Admitting: Cardiology

## 2019-12-13 ENCOUNTER — Ambulatory Visit (INDEPENDENT_AMBULATORY_CARE_PROVIDER_SITE_OTHER): Payer: Medicare Other | Admitting: Cardiology

## 2019-12-13 ENCOUNTER — Other Ambulatory Visit: Payer: Self-pay

## 2019-12-13 ENCOUNTER — Other Ambulatory Visit (HOSPITAL_COMMUNITY): Payer: Self-pay | Admitting: Cardiology

## 2019-12-13 VITALS — BP 122/84 | HR 67 | Ht 73.0 in | Wt 197.0 lb

## 2019-12-13 DIAGNOSIS — I10 Essential (primary) hypertension: Secondary | ICD-10-CM | POA: Diagnosis not present

## 2019-12-13 DIAGNOSIS — I714 Abdominal aortic aneurysm, without rupture, unspecified: Secondary | ICD-10-CM

## 2019-12-13 DIAGNOSIS — I359 Nonrheumatic aortic valve disorder, unspecified: Secondary | ICD-10-CM | POA: Diagnosis not present

## 2019-12-13 DIAGNOSIS — R931 Abnormal findings on diagnostic imaging of heart and coronary circulation: Secondary | ICD-10-CM | POA: Diagnosis not present

## 2019-12-13 DIAGNOSIS — I723 Aneurysm of iliac artery: Secondary | ICD-10-CM | POA: Insufficient documentation

## 2019-12-13 DIAGNOSIS — Q210 Ventricular septal defect: Secondary | ICD-10-CM | POA: Diagnosis not present

## 2019-12-13 MED ORDER — PRAVASTATIN SODIUM 40 MG PO TABS: 40.0000 mg | ORAL_TABLET | Freq: Every evening | ORAL | 3 refills | Status: DC

## 2019-12-13 NOTE — Patient Instructions (Signed)
Medication Instructions:  BEGIN TAKING PRAVASTATIN 40 MG DAILY= 1 TABLET DAILY *If you need a refill on your cardiac medications before your next appointment, please call your pharmacy*  Lab Work: LIPID AND LIVER IN 12 WEEKS If you have labs (blood work) drawn today and your tests are completely normal, you will receive your results only by: Marland Kitchen MyChart Message (if you have MyChart) OR . A paper copy in the mail If you have any lab test that is abnormal or we need to change your treatment, we will call you to review the results.  Testing/Procedures: Your physician has requested that you have an echocardiogram. Echocardiography is a painless test that uses sound waves to create images of your heart. It provides your doctor with information about the size and shape of your heart and how well your heart's chambers and valves are working. This procedure takes approximately one hour. There are no restrictions for this procedure.  Roger Kent WILL CALL TO SCHEDULE  Follow-Up: At [facility], you and your health needs are our priority.  As part of our continuing mission to provide you with exceptional heart care, we have created designated Provider Care Teams.  These Care Teams include your primary Cardiologist (physician) and Advanced Practice Providers (APPs -  Physician Assistants and Nurse Practitioners) who all work together to provide you with the care you need, when you need it.  Your next appointment:   12 month(s)  The format for your next appointment:   In Person  Provider:   Kirk Ruths, MD

## 2019-12-14 ENCOUNTER — Other Ambulatory Visit: Payer: Self-pay

## 2019-12-15 ENCOUNTER — Other Ambulatory Visit: Payer: Self-pay

## 2019-12-15 ENCOUNTER — Telehealth: Payer: Self-pay

## 2019-12-15 NOTE — Telephone Encounter (Signed)
Incoming call from [employer] in [employer]. Christina from [place] calling regarding pt allergy to statins. Call transferred. She states Dr. Stanford Breed ordered pravastatin 40 mg for pt although pt has allergy to statins and she wants to make sure it is okay to go ahead and fill medication.  Reviewed 1/25 OV note Assessment and Plan with Pharmacist: "6 hyperlipidemia-given documented coronary disease I would like for him to be on a statin long-term.  He recently tried low-dose Crestor but had significant side effects. We will try pravastatin 40 mg daily. Check lipids and liver in 12 weeks. If he does not tolerate we will consider Zetia or Repatha."  She states their records show that pt had rxn to rosuvastatin in the past and inquired if there was any specific info about side effects. Informed her that allergy list includes rosuvastatin, but no specifics about side effects and not included in notes. Informed her that since Dr. Stanford Breed aware of side effects on previous statin and ordered pravastatin, it may be okay to go ahead and fill medication but will route to confirm. Margreta Journey states she will await call back and will put med on hold until then since this is the third and final attempt for med reconciliation. She states call back number is [phone] and reference number is [id number].  Contacted pt regarding side effects of rosuvastatin. No answer.

## 2019-12-16 NOTE — Telephone Encounter (Signed)
Called pharmacy back and advised it was okay per provider.

## 2019-12-16 NOTE — Telephone Encounter (Signed)
Patient states, " the best I can explain is that I felt nauseous, sick headed like heavy headed and just totally uncomfortable." He states that he gave his symptoms to Dr. Stanford Breed at his last appt on 12/13/19.

## 2019-12-16 NOTE — Telephone Encounter (Signed)
Start pravastatin 40 mg daily and follow for side effects Kirk Ruths

## 2019-12-16 NOTE — Telephone Encounter (Signed)
Patient is returning phone call.  °

## 2019-12-16 NOTE — Telephone Encounter (Signed)
Routing to MD to advise if okay for patient to start new statin medication per pharmacist.

## 2019-12-16 NOTE — Telephone Encounter (Signed)
Attempted to contact patient back- LVM advising that we just needed to know what side effects he had on the rosuvastatin so we could notify the pharmacy if okay for him to take pravastatin.  Left call back number.

## 2019-12-23 ENCOUNTER — Other Ambulatory Visit: Payer: Self-pay | Admitting: Internal Medicine

## 2019-12-28 DIAGNOSIS — R972 Elevated prostate specific antigen [PSA]: Secondary | ICD-10-CM | POA: Diagnosis not present

## 2019-12-30 ENCOUNTER — Other Ambulatory Visit: Payer: Self-pay

## 2019-12-30 ENCOUNTER — Ambulatory Visit: Payer: Medicare Other | Attending: Internal Medicine

## 2019-12-30 DIAGNOSIS — Z23 Encounter for immunization: Secondary | ICD-10-CM | POA: Insufficient documentation

## 2019-12-30 NOTE — Progress Notes (Signed)
   Covid-19 Vaccination Clinic  Name:  Roger Kent    MRN: [id number] DOB: [birth year]  12/30/2019  Mr. Markunas was observed post Covid-19 immunization for 15 minutes without incidence. He was provided with Vaccine Information Sheet and instruction to access the V-Safe system.   Mr. Alatorre was instructed to call 911 with any severe reactions post vaccine: Marland Kitchen Difficulty breathing  . Swelling of your face and throat  . A fast heartbeat  . A bad rash all over your body  . Dizziness and weakness    Immunizations Administered    Name Date Dose VIS Date Route   Pfizer COVID-19 Vaccine 12/30/2019  9:46 AM 0.3 mL 10/29/2019 Intramuscular   Manufacturer: [place]   Lot: [id number]   [place]: [id number]

## 2020-01-04 ENCOUNTER — Other Ambulatory Visit: Payer: Self-pay

## 2020-01-04 ENCOUNTER — Ambulatory Visit (HOSPITAL_COMMUNITY): Payer: Medicare Other | Attending: Cardiovascular Disease

## 2020-01-04 DIAGNOSIS — I359 Nonrheumatic aortic valve disorder, unspecified: Secondary | ICD-10-CM

## 2020-01-11 ENCOUNTER — Other Ambulatory Visit: Payer: Self-pay

## 2020-01-11 NOTE — Progress Notes (Signed)
error 

## 2020-01-17 ENCOUNTER — Ambulatory Visit (INDEPENDENT_AMBULATORY_CARE_PROVIDER_SITE_OTHER)
Admission: RE | Admit: 2020-01-17 | Discharge: 2020-01-17 | Disposition: A | Discharge status: Home / Self Care | Payer: Medicare Other | Source: Ambulatory Visit | Attending: Internal Medicine | Admitting: Internal Medicine

## 2020-01-17 ENCOUNTER — Ambulatory Visit (INDEPENDENT_AMBULATORY_CARE_PROVIDER_SITE_OTHER): Payer: Medicare Other

## 2020-01-17 ENCOUNTER — Encounter: Payer: Self-pay | Admitting: Internal Medicine

## 2020-01-17 ENCOUNTER — Other Ambulatory Visit: Payer: Self-pay

## 2020-01-17 ENCOUNTER — Ambulatory Visit (INDEPENDENT_AMBULATORY_CARE_PROVIDER_SITE_OTHER): Payer: Medicare Other | Admitting: Internal Medicine

## 2020-01-17 DIAGNOSIS — M810 Age-related osteoporosis without current pathological fracture: Secondary | ICD-10-CM

## 2020-01-17 DIAGNOSIS — E785 Hyperlipidemia, unspecified: Secondary | ICD-10-CM | POA: Diagnosis not present

## 2020-01-17 DIAGNOSIS — M545 Low back pain, unspecified: Secondary | ICD-10-CM

## 2020-01-17 DIAGNOSIS — R931 Abnormal findings on diagnostic imaging of heart and coronary circulation: Secondary | ICD-10-CM | POA: Diagnosis not present

## 2020-01-17 DIAGNOSIS — G8929 Other chronic pain: Secondary | ICD-10-CM

## 2020-01-17 NOTE — Assessment & Plan Note (Signed)
LS x ray 

## 2020-01-17 NOTE — Assessment & Plan Note (Signed)
Vit D, strength training BDS

## 2020-01-17 NOTE — Assessment & Plan Note (Signed)
Pravastatin  

## 2020-01-17 NOTE — Progress Notes (Signed)
Subjective:  Patient ID: Roger Kent, male    DOB: 1946/09/22  Age: 74 y.o. MRN: [id number]  CC: No chief complaint on file.   HPI Jeanette Difranco Wohlfarth presents for LBP., dyslipidemia, CAD f/u  Outpatient Medications Prior to Visit  Medication Sig Dispense Refill  . Chlorpheniramine Maleate (ALLERGY PO) Take by mouth.    . cholecalciferol (VITAMIN D) 1000 UNITS tablet Take 1,000 Units by mouth every morning.    . Cyanocobalamin (VITAMIN B 12 PO) Take by mouth.    . EPINEPHrine (EPIPEN 2-PAK) 0.3 mg/0.3 mL IJ SOAJ injection Inject 0.3 mLs (0.3 mg total) into the muscle as needed for anaphylaxis. 1 Device 3  . folic acid (FOLVITE) 1 MG tablet TAKE 2 TABLETS (2MG  TOTAL) DAILY 180 tablet 3  . glucosamine-chondroitin 500-400 MG tablet Take 2 tablets by mouth every morning.     . Homeopathic Products (STRESS/EXHAUSTION RELIEF SL) Take 2 capsules by mouth at bedtime.    . Lactobacillus (CVS ACIDOPHILUS PO) Take by mouth daily.    Marland Kitchen Lifitegrast (XIIDRA) 5 % SOLN Apply to eye 2 (two) times daily.    Marland Kitchen loteprednol (LOTEMAX) 0.5 % ophthalmic suspension 3 (three) times daily.    . Multiple Vitamin (MULTIVITAMIN WITH MINERALS) TABS tablet Take 1 tablet by mouth every morning.    . Nutritional Supplements (QUINOA KALE & HEMP) LIQD Take by mouth daily.    Marland Kitchen olmesartan (BENICAR) 40 MG tablet TAKE 1 TABLET DAILY 90 tablet 3  . omeprazole-sodium bicarbonate (ZEGERID) 40-1100 MG per capsule Take 1 capsule by mouth daily before breakfast.    . OVER THE COUNTER MEDICATION Take 3 capsules by mouth every morning. OTC herbal supplement for prostate health    . OVER THE COUNTER MEDICATION Lipobiotol daily    . pravastatin (PRAVACHOL) 40 MG tablet Take 1 tablet (40 mg total) by mouth every evening. 90 tablet 3  . rivaroxaban (XARELTO) 10 MG TABS tablet Take 1 tablet (10 mg total) by mouth daily. 90 tablet 3   No facility-administered medications prior to visit.    ROS: Review of Systems    Constitutional: Negative for appetite change, fatigue and unexpected weight change.  HENT: Negative for congestion, nosebleeds, sneezing, sore throat and trouble swallowing.   Eyes: Negative for itching and visual disturbance.  Respiratory: Negative for cough.   Cardiovascular: Negative for chest pain, palpitations and leg swelling.  Gastrointestinal: Negative for abdominal distention, blood in stool, diarrhea and nausea.  Genitourinary: Negative for frequency and hematuria.  Musculoskeletal: Positive for back pain. Negative for gait problem, joint swelling and neck pain.  Skin: Negative for rash.  Neurological: Negative for dizziness, tremors, speech difficulty and weakness.  Psychiatric/Behavioral: Negative for agitation, dysphoric mood and sleep disturbance. The patient is not nervous/anxious.     Objective:  BP 134/84 (BP Location: Left Arm, Patient Position: Sitting, Cuff Size: Normal)   Pulse 72   Temp 98.6 F (37 C) (Oral)   Ht 6\' 1"  (1.854 m)   Wt 196 lb (88.9 kg)   SpO2 96%   BMI 25.86 kg/m   BP Readings from Last 3 Encounters:  01/17/20 134/84  12/13/19 122/84  12/07/19 126/74    Wt Readings from Last 3 Encounters:  01/17/20 196 lb (88.9 kg)  12/13/19 197 lb (89.4 kg)  12/07/19 197 lb (89.4 kg)    Physical Exam Constitutional:      General: He is not in acute distress.    Appearance: He is well-developed.  Comments: NAD  Eyes:     Conjunctiva/sclera: Conjunctivae normal.     Pupils: Pupils are equal, round, and reactive to light.  Neck:     Thyroid: No thyromegaly.     Vascular: No JVD.  Cardiovascular:     Rate and Rhythm: Normal rate and regular rhythm.     Heart sounds: Normal heart sounds. No murmur. No friction rub. No gallop.   Pulmonary:     Effort: Pulmonary effort is normal. No respiratory distress.     Breath sounds: Normal breath sounds. No wheezing or rales.  Chest:     Chest wall: No tenderness.  Abdominal:     General: Bowel sounds  are normal. There is no distension.     Palpations: Abdomen is soft. There is no mass.     Tenderness: There is no abdominal tenderness. There is no guarding or rebound.  Musculoskeletal:        General: Tenderness present. Normal range of motion.     Cervical back: Normal range of motion.  Lymphadenopathy:     Cervical: No cervical adenopathy.  Skin:    General: Skin is warm and dry.     Findings: No rash.  Neurological:     Mental Status: He is alert and oriented to person, place, and time.     Cranial Nerves: No cranial nerve deficit.     Motor: No abnormal muscle tone.     Coordination: Coordination normal.     Gait: Gait normal.     Deep Tendon Reflexes: Reflexes are normal and symmetric.  Psychiatric:        Behavior: Behavior normal.        Thought Content: Thought content normal.        Judgment: Judgment normal.   LS tender  Lab Results  Component Value Date   WBC 8.7 12/07/2019   HGB 15.3 12/07/2019   HCT 47.2 12/07/2019   PLT 257 12/07/2019   GLUCOSE 112 (H) 12/07/2019   CHOL 96 07/15/2019   TRIG 106.0 07/15/2019   HDL 39.20 07/15/2019   LDLCALC 35 07/15/2019   ALT 16 12/07/2019   AST 18 12/07/2019   NA 140 12/07/2019   K 4.6 12/07/2019   CL 104 12/07/2019   CREATININE 1.07 12/07/2019   BUN 17 12/07/2019   CO2 29 12/07/2019   TSH 2.37 12/29/2018   PSA 9.86 (H) 08/24/2018   INR 2.4 RATIO (H) 09/17/2007   HGBA1C 5.8 12/29/2018    VAS [place] AAA DUPLEX  Result Date: 12/13/2019 ABDOMINAL AORTA STUDY Indications: Follow up to common iliac artery ectasia/aneurysm. Risk Factors: Hypertension, hyperlipidemia, past history of smoking, coronary               artery disease. Other Factors: Congenital VSD, Hiatal Hernia.  Comparison Study: Prior aorta duplex exam on 11/26/2018 showed 1.7 x.1.7 cm right                   common iliac artery and left common iliac artery 1.5 x 1.6 cm.                   Largest aorta measurement was 2.6 x 2.6 cm. Performing Technologist:  Salvadore Dom RVT, RDCS (AE), RDMS  Examination Guidelines: A complete evaluation includes B-mode imaging, spectral Doppler, color Doppler, and power Doppler as needed of all accessible portions of each vessel. Bilateral testing is considered an integral part of a complete examination. Limited examinations for reoccurring indications may be performed as  noted.  Abdominal Aorta Findings: +------------+-------+---------+---------+---------+--------+------------------+ Location    AP (cm)Trans    PSV      Waveform ThrombusComments                              (cm)     (cm/s)                                       +------------+-------+---------+---------+---------+--------+------------------+ Proximal    2.50   2.70     63       biphasic                            +------------+-------+---------+---------+---------+--------+------------------+ Mid         2.40   2.20     70       biphasic                            +------------+-------+---------+---------+---------+--------+------------------+ Distal      2.60   2.90     76       triphasic                           +------------+-------+---------+---------+---------+--------+------------------+ RT CIA Prox 1.8    1.8      51       triphasic        tortuous and                                                             ectatic            +------------+-------+---------+---------+---------+--------+------------------+ RT EIA Prox 1.8    1.7      97       triphasic        tortuous and                                                             ectatic            +------------+-------+---------+---------+---------+--------+------------------+ RT EIA      1.5             92       triphasic        ectatic            Distal                                                                   +------------+-------+---------+---------+---------+--------+------------------+ LT CIA Prox 1.6     1.8      96       triphasic        ectatic            +------------+-------+---------+---------+---------+--------+------------------+  LT EIA Prox 1.3    1.3      136      triphasic                           +------------+-------+---------+---------+---------+--------+------------------+ LT EIA      1.2             119      triphasic                           Distal                                                                   +------------+-------+---------+---------+---------+--------+------------------+ IVC/Iliac Findings: +--------+------+--------+--------+   IVC   PatentThrombusComments +--------+------+--------+--------+ IVC Proxpatent                 +--------+------+--------+--------+    Summary: Abdominal Aorta: No evidence of an abdominal aortic aneurysm was visualized. There is evidence of abnormal dilation of the Right Common Iliac artery, Left Common Iliac artery and Right External Iliac artery. The largest aortic measurement is 2.9 cm. Previous diameter measurement was 2.6 cm obtained on 11/26/2018. Stenosis: No evidence of stenosis seen in aorta or both iliac arteries. The right CIA and EIA are tortuous. Stable size of iliac arteries since prior exam. IVC/Iliac: There is no evidence of thrombus involving the IVC.  *See table(s) above for measurements and observations. Suggest follow up study in 12 months.  Electronically signed by Quay Burow MD on 12/13/2019 at 2:37:13 PM.    Final     Assessment & Plan:   There are no diagnoses linked to this encounter.   No orders of the defined types were placed in this encounter.    Follow-up: No follow-ups on file.  Walker Kehr, MD

## 2020-01-19 ENCOUNTER — Encounter (INDEPENDENT_AMBULATORY_CARE_PROVIDER_SITE_OTHER): Payer: Medicare Other | Admitting: Ophthalmology

## 2020-01-19 DIAGNOSIS — I10 Essential (primary) hypertension: Secondary | ICD-10-CM

## 2020-01-19 DIAGNOSIS — H59031 Cystoid macular edema following cataract surgery, right eye: Secondary | ICD-10-CM | POA: Diagnosis not present

## 2020-01-19 DIAGNOSIS — H35033 Hypertensive retinopathy, bilateral: Secondary | ICD-10-CM

## 2020-01-19 DIAGNOSIS — H338 Other retinal detachments: Secondary | ICD-10-CM

## 2020-01-23 ENCOUNTER — Ambulatory Visit: Payer: Medicare Other | Attending: Internal Medicine

## 2020-01-23 DIAGNOSIS — Z23 Encounter for immunization: Secondary | ICD-10-CM | POA: Insufficient documentation

## 2020-01-23 NOTE — Progress Notes (Signed)
   Covid-19 Vaccination Clinic  Name:  Jameion Verdugo    MRN: [id number] DOB: December 21, 1945  01/23/2020  Mr. Alban was observed post Covid-19 immunization for 15 minutes without incident. He was provided with Vaccine Information Sheet and instruction to access the V-Safe system.   Mr. Hemani was instructed to call 911 with any severe reactions post vaccine: Marland Kitchen Difficulty breathing  . Swelling of face and throat  . A fast heartbeat  . A bad rash all over body  . Dizziness and weakness   Immunizations Administered    Name Date Dose VIS Date Route   Pfizer COVID-19 Vaccine 01/23/2020 10:03 AM 0.3 mL 10/29/2019 Intramuscular   Manufacturer: [place]   Lot: [id number]   [place]: [id number]

## 2020-01-27 NOTE — Telephone Encounter (Signed)
Submitting for verification. Will contact patient to schedule once approved.

## 2020-02-28 DIAGNOSIS — H35033 Hypertensive retinopathy, bilateral: Secondary | ICD-10-CM | POA: Diagnosis not present

## 2020-02-28 DIAGNOSIS — H40013 Open angle with borderline findings, low risk, bilateral: Secondary | ICD-10-CM | POA: Diagnosis not present

## 2020-02-28 DIAGNOSIS — Z961 Presence of intraocular lens: Secondary | ICD-10-CM | POA: Diagnosis not present

## 2020-02-28 DIAGNOSIS — H31093 Other chorioretinal scars, bilateral: Secondary | ICD-10-CM | POA: Diagnosis not present

## 2020-03-09 DIAGNOSIS — I10 Essential (primary) hypertension: Secondary | ICD-10-CM | POA: Diagnosis not present

## 2020-03-09 DIAGNOSIS — Q210 Ventricular septal defect: Secondary | ICD-10-CM | POA: Diagnosis not present

## 2020-03-09 DIAGNOSIS — I359 Nonrheumatic aortic valve disorder, unspecified: Secondary | ICD-10-CM | POA: Diagnosis not present

## 2020-03-09 DIAGNOSIS — I723 Aneurysm of iliac artery: Secondary | ICD-10-CM | POA: Diagnosis not present

## 2020-03-09 DIAGNOSIS — R931 Abnormal findings on diagnostic imaging of heart and coronary circulation: Secondary | ICD-10-CM | POA: Diagnosis not present

## 2020-03-09 LAB — LIPID PANEL
Chol/HDL Ratio: 3 ratio (ref 0.0–5.0)
Cholesterol, Total: 126 mg/dL (ref 100–199)
HDL: 42 mg/dL (ref >39)
LDL Chol Calc (NIH): 71 mg/dL (ref 0–99)
Triglycerides: 63 mg/dL (ref 0–149)
VLDL Cholesterol Cal: 13 mg/dL (ref 5–40)

## 2020-03-09 LAB — HEPATIC FUNCTION PANEL
ALT: 13 IU/L (ref 0–44)
AST: 17 IU/L (ref 0–40)
Albumin: 4.5 g/dL (ref 3.7–4.7)
Alkaline Phosphatase: 61 IU/L (ref 39–117)
Bilirubin Total: 0.8 mg/dL (ref 0.0–1.2)
Bilirubin, Direct: 0.25 mg/dL (ref 0.00–0.40)
Total Protein: 7.5 g/dL (ref 6.0–8.5)

## 2020-03-28 ENCOUNTER — Telehealth: Payer: Self-pay | Admitting: Cardiology

## 2020-03-28 NOTE — Telephone Encounter (Signed)
Checking on insurance verification status.

## 2020-03-28 NOTE — Telephone Encounter (Signed)
Pt c/o medication issue:  1. Name of Medication: pravastatin (PRAVACHOL) 40 MG tablet  2. How are you currently taking this medication (dosage and times per day)? 1 tablet a day in evening  3. Are you having a reaction (difficulty breathing--STAT)? no  4. What is your medication issue? Patient states he is having moderate to severe leg cramps and would like to know if it could be the medication.

## 2020-03-28 NOTE — Telephone Encounter (Signed)
Left message for patient to stop pravastatin for 2 weeks and let us know how he is doing.

## 2020-03-28 NOTE — Telephone Encounter (Signed)
Attempted to call pt to discuss pharmacist's recommendations. [id number].

## 2020-03-28 NOTE — Telephone Encounter (Signed)
Probably is the pravastatin causing his leg cramps.  He should stop and see if they resolve after 2-3 weeks.  Has failed 2 statin drugs, so will need appointment with [facility] to discuss further options for cholesterol lowering.

## 2020-03-28 NOTE — Telephone Encounter (Signed)
Patient is calling to follow up on if this has been approved. He states its been 2 months. I have informed the patient these do take some time to get approved.

## 2020-03-29 ENCOUNTER — Telehealth: Payer: Self-pay | Admitting: Cardiology

## 2020-03-29 NOTE — Telephone Encounter (Signed)
Insurance has been submitted and verified for Prolia. Patient is responsible for a $0 copay.  Left message for patient to call back to schedule.   Okay to schedule... Visit Note: Prolia ($0 copay - okay to give per Gareth Eagle) Visit Type: Nurse Provider: Nurse

## 2020-03-29 NOTE — Telephone Encounter (Signed)
      I went in pt's chart to see if anybody had called him back yesterday.

## 2020-04-03 ENCOUNTER — Other Ambulatory Visit: Payer: Self-pay

## 2020-04-03 ENCOUNTER — Ambulatory Visit (INDEPENDENT_AMBULATORY_CARE_PROVIDER_SITE_OTHER): Payer: Medicare Other | Admitting: *Deleted

## 2020-04-03 DIAGNOSIS — M810 Age-related osteoporosis without current pathological fracture: Secondary | ICD-10-CM | POA: Diagnosis not present

## 2020-04-03 IMAGING — DX DG ANKLE COMPLETE 3+V*R*
3 series · 3 of 3 positions shown · non-contrast
Comparison: None.

CLINICAL DATA: Right lower leg pain and swelling

EXAM:
RIGHT ANKLE - COMPLETE 3+ VIEW

[ankle ap]
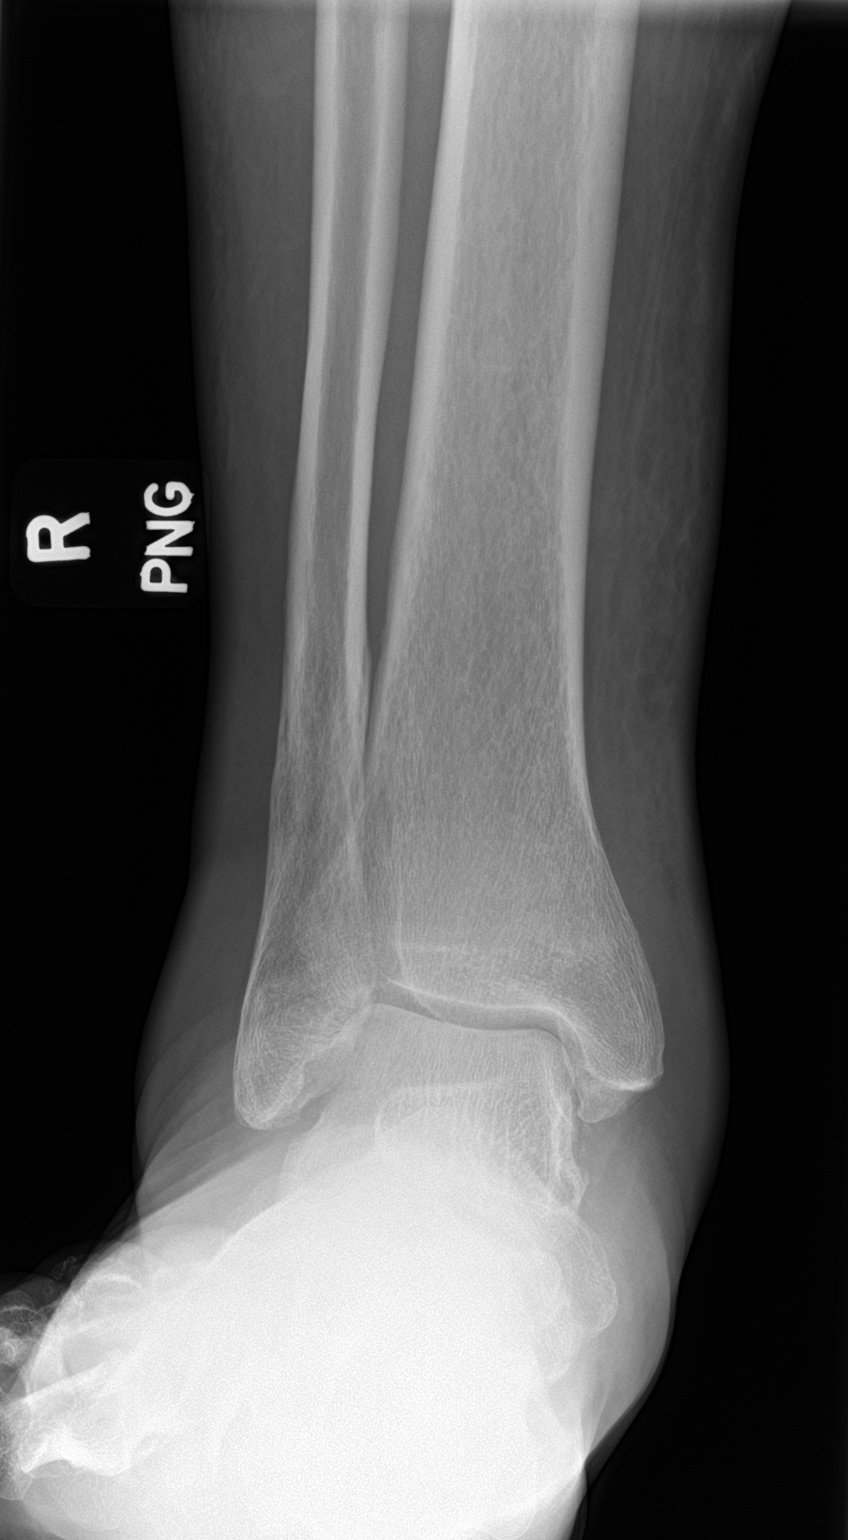

[ankle obl]
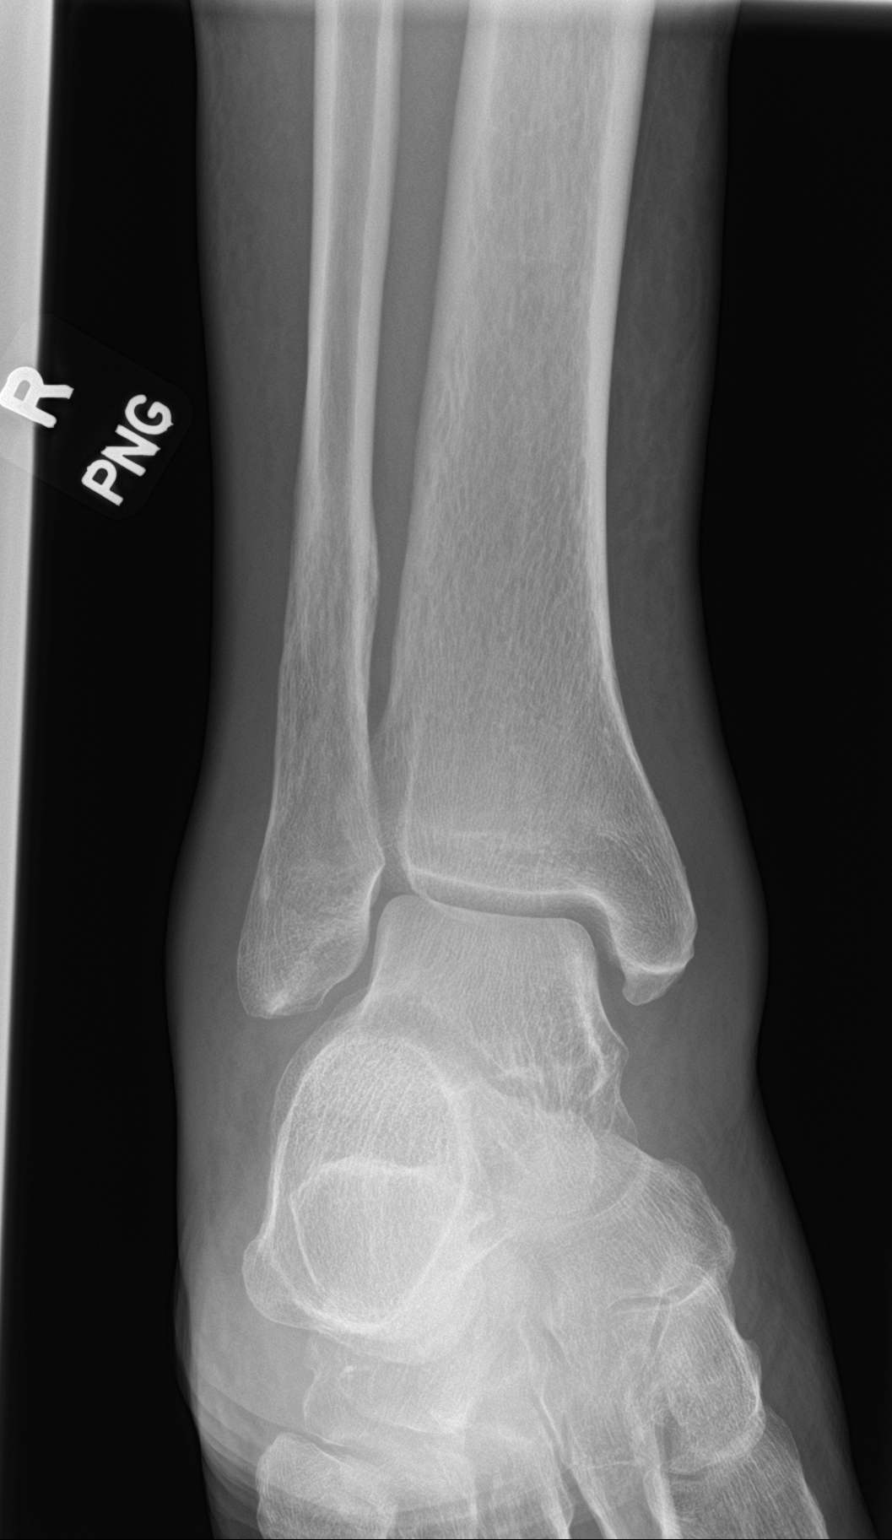

[ankle lat]
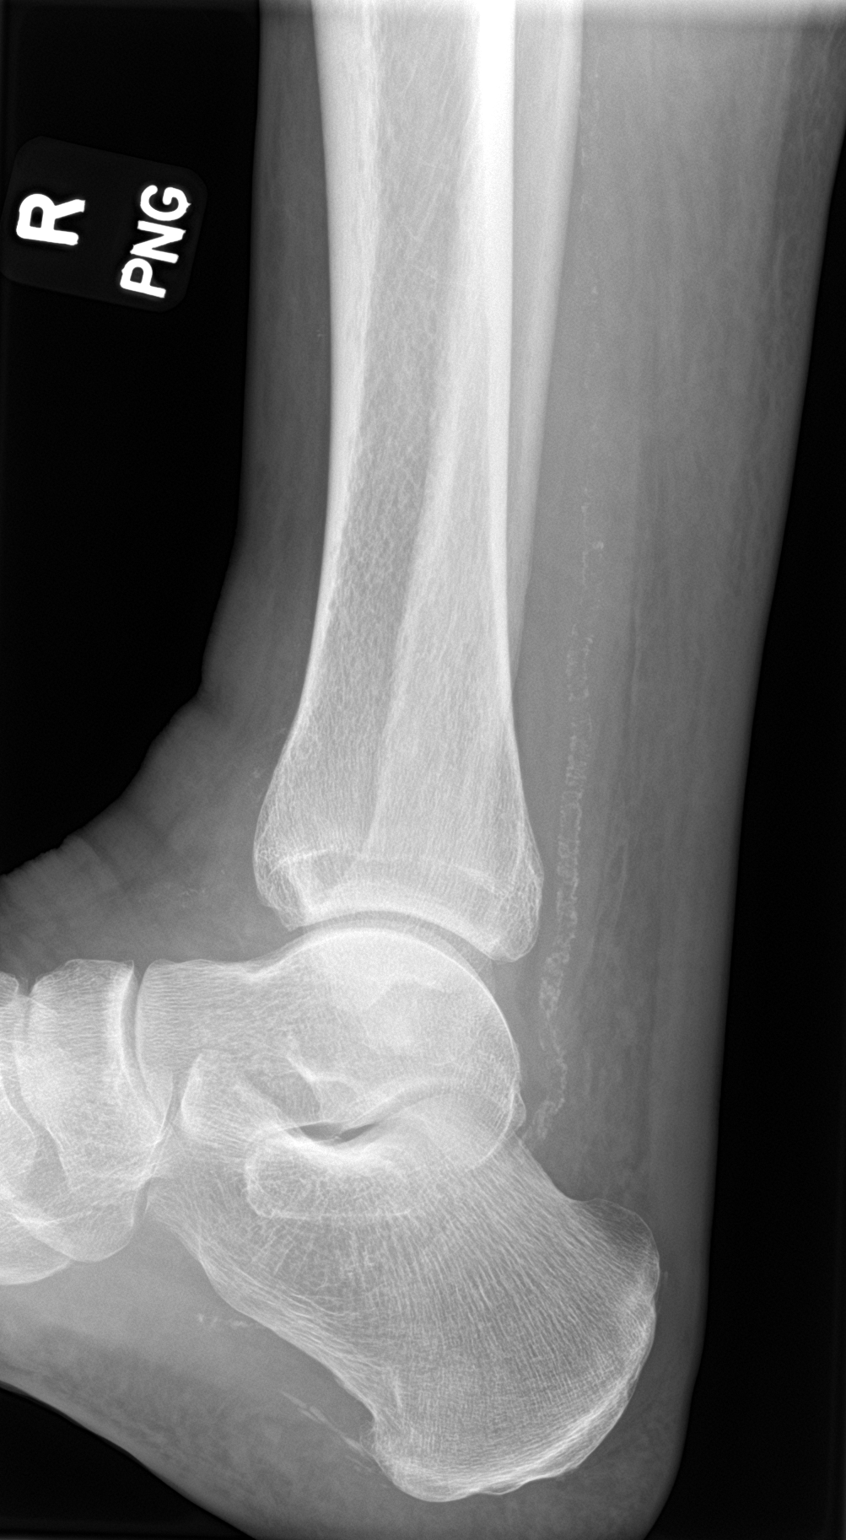

[3 of 3 positions shown; findings below may reference images not displayed]

FINDINGS: Normal ankle joint. No fracture. Negative for effusion. Posterior
tibial artery calcification.
IMPRESSION: Negative ankle.  Atherosclerotic disease.

## 2020-04-03 MED ORDER — DENOSUMAB 60 MG/ML ~~LOC~~ SOSY
60.0000 mg | PREFILLED_SYRINGE | Freq: Once | SUBCUTANEOUS | Status: AC
  Administered 2020-04-03: 60 mg via SUBCUTANEOUS

## 2020-04-03 NOTE — Progress Notes (Addendum)
Pls cosign for prolia inj../lmb  Medical screening examination/treatment/procedure(s) were performed by non-physician practitioner and as supervising physician I was immediately available for consultation/collaboration.  I agree with above. [name], MD 

## 2020-04-10 ENCOUNTER — Other Ambulatory Visit: Payer: Self-pay | Admitting: Internal Medicine

## 2020-04-10 DIAGNOSIS — G8929 Other chronic pain: Secondary | ICD-10-CM

## 2020-04-12 ENCOUNTER — Encounter (INDEPENDENT_AMBULATORY_CARE_PROVIDER_SITE_OTHER): Payer: Medicare Other | Admitting: Ophthalmology

## 2020-04-12 DIAGNOSIS — H35372 Puckering of macula, left eye: Secondary | ICD-10-CM | POA: Diagnosis not present

## 2020-04-12 DIAGNOSIS — H43813 Vitreous degeneration, bilateral: Secondary | ICD-10-CM

## 2020-04-12 DIAGNOSIS — H35033 Hypertensive retinopathy, bilateral: Secondary | ICD-10-CM

## 2020-04-12 DIAGNOSIS — H59031 Cystoid macular edema following cataract surgery, right eye: Secondary | ICD-10-CM | POA: Diagnosis not present

## 2020-04-12 DIAGNOSIS — I10 Essential (primary) hypertension: Secondary | ICD-10-CM

## 2020-04-12 DIAGNOSIS — H338 Other retinal detachments: Secondary | ICD-10-CM | POA: Diagnosis not present

## 2020-04-13 ENCOUNTER — Ambulatory Visit: Payer: Medicare Other

## 2020-04-18 ENCOUNTER — Encounter: Payer: Self-pay | Admitting: *Deleted

## 2020-04-18 DIAGNOSIS — I712 Thoracic aortic aneurysm, without rupture, unspecified: Secondary | ICD-10-CM

## 2020-04-19 ENCOUNTER — Encounter: Payer: Self-pay | Admitting: *Deleted

## 2020-04-19 NOTE — Progress Notes (Signed)
Patient ID: Roger Kent                 DOB: 1946/07/25                    MRN: [id number]     HPI: Roger Kent is a 74 y.o. male patient referred to lipid clinic by Dr. Stanford Breed. PMH is significant for FU congenital VSD and prior DVT (on Xarelto). Calcium score from 04/2019 was elevated at 277.   He recently started having moderate to severe leg cramps from pravastatin and was told to stop the medication for 2-3 weeks to see if the muscle pain resolved.   Today he presents with his wife in good spirits. He has been off pravastatin for about 2.5 weeks and notes about a 50% improvement in myopathy. He mentioned that his pain was very severe with pravastatin and it was a slow onset beginning about 2-3 weeks after starting pravastatin and continually got worse. In total he was on the pravastatin for about 6 weeks. He has tried rosuvastatin in the past as well and experienced nausea and flu like symptoms after several months of taking the medication.   Current Medications: pravastatin 40 mg daily (held x2.5 weeks) Intolerances: rosuvastatin 5mg  daily (nausea + flu like symptoms)  Risk Factors: CAD from elevated CAC LDL goal: <70 ASCVD risk score: 23%  Diet: Eats 3 meals per day; eats meat in moderation (lean cuisine meals); eats a lot of salads and vegetables; mostly drinks water and decaf coffee but has sweet tea (occasionally); eats popcorn and fruit as snacks  Exercise: Goes to the [facility] every day and stays active as a beekeeper  Family History: Mother died at 55 from cancer; no family cardiac history  Social History: No alcohol or cigarettes   Lipids (03/09/20): TC 126, HDL 42, LDL 71, TG 63 (on pravastatin 40mg  daily)  Past Medical History:  Diagnosis Date  . Acute hepatitis C without mention of hepatic coma(070.51)   . Allergic rhinitis, cause unspecified   . Anxiety state, unspecified   . Barrett's esophagus   . BPH (benign prostatic hyperplasia)   . DVT (deep  venous thrombosis) ([place])   . Elevated PSA    Dr Karsten Ro  . Esophageal reflux   . Hiatal hernia   . Hyperlipidemia   . Hypertension   . Hypertonicity of bladder   . Insomnia   . Leg DVT (deep venous thromboembolism), chronic, unspecified laterality ([place]) 09/21/2018  . Osteoporosis, unspecified   . Personal history of urinary calculi   . Recent retinal detachment, partial, with giant tear   . Unspecified tinnitus   . Ventricular septal defect     Current Outpatient Medications on File Prior to Visit  Medication Sig Dispense Refill  . Chlorpheniramine Maleate (ALLERGY PO) Take by mouth.    . cholecalciferol (VITAMIN D) 1000 UNITS tablet Take 1,000 Units by mouth every morning.    . Cyanocobalamin (VITAMIN B 12 PO) Take by mouth.    . EPINEPHrine (EPIPEN 2-PAK) 0.3 mg/0.3 mL IJ SOAJ injection Inject 0.3 mLs (0.3 mg total) into the muscle as needed for anaphylaxis. 1 Device 3  . folic acid (FOLVITE) 1 MG tablet TAKE 2 TABLETS (2MG  TOTAL) DAILY 180 tablet 3  . glucosamine-chondroitin 500-400 MG tablet Take 2 tablets by mouth every morning.     . Homeopathic Products (STRESS/EXHAUSTION RELIEF SL) Take 2 capsules by mouth at bedtime.    . Lactobacillus (CVS ACIDOPHILUS PO) Take  by mouth daily.    Marland Kitchen Lifitegrast (XIIDRA) 5 % SOLN Apply to eye 2 (two) times daily.    Marland Kitchen loteprednol (LOTEMAX) 0.5 % ophthalmic suspension 3 (three) times daily.    . Multiple Vitamin (MULTIVITAMIN WITH MINERALS) TABS tablet Take 1 tablet by mouth every morning.    . Nutritional Supplements (QUINOA KALE & HEMP) LIQD Take by mouth daily.    Marland Kitchen olmesartan (BENICAR) 40 MG tablet TAKE 1 TABLET DAILY 90 tablet 3  . omeprazole-sodium bicarbonate (ZEGERID) 40-1100 MG per capsule Take 1 capsule by mouth daily before breakfast.    . OVER THE COUNTER MEDICATION Take 3 capsules by mouth every morning. OTC herbal supplement for prostate health    . OVER THE COUNTER MEDICATION Lipobiotol daily    . pravastatin (PRAVACHOL) 40 MG  tablet Take 1 tablet (40 mg total) by mouth every evening. 90 tablet 3  . rivaroxaban (XARELTO) 10 MG TABS tablet Take 1 tablet (10 mg total) by mouth daily. 90 tablet 3   No current facility-administered medications on file prior to visit.    Allergies  Allergen Reactions  . Oxycodone-Acetaminophen Other (See Comments)    Other Reaction: agitation  . Crestor [Rosuvastatin]   . Lamisil [Terbinafine] Rash    Assessment/Plan:  1. CAD - CAC score 277 in 04/2019 - LDL 71 > while on pravastatin 40mg  daily, elevated above goal <70 - Discontinue pravastatin 40 mg daily d/t only 50% improvement in myopathy - Initiate ezetimibe 10 mg daily - Check lipid panel and LFTs in 2-3 months - Consider adding PCSK9i if LDL >70 upon recheck or if pt does not tolerate ezetimibe. -Discussed injection technique with patient and he is willing to use if he needs it - His insurance plan requires a prior authorization for use of PCSK9i and require the use of at least two statins and trial of ezetimibe prior to coverage  Kennon Holter, PharmD PGY1 Ambulatory Care Pharmacy Resident

## 2020-04-20 ENCOUNTER — Other Ambulatory Visit: Payer: Self-pay | Admitting: Hematology & Oncology

## 2020-04-20 ENCOUNTER — Other Ambulatory Visit: Payer: Self-pay

## 2020-04-20 ENCOUNTER — Ambulatory Visit (INDEPENDENT_AMBULATORY_CARE_PROVIDER_SITE_OTHER): Payer: Medicare Other | Admitting: Pharmacist

## 2020-04-20 DIAGNOSIS — I2510 Atherosclerotic heart disease of native coronary artery without angina pectoris: Secondary | ICD-10-CM

## 2020-04-20 DIAGNOSIS — I2583 Coronary atherosclerosis due to lipid rich plaque: Secondary | ICD-10-CM | POA: Diagnosis not present

## 2020-04-20 MED ORDER — EZETIMIBE 10 MG PO TABS: 10.0000 mg | ORAL_TABLET | Freq: Every day | ORAL | 3 refills | Status: DC

## 2020-04-20 NOTE — Patient Instructions (Addendum)
We will start you on a medication called zetia (ezetimibe) 10 mg daily.  Stop taking your pravastatin  We will schedule you for a lipid panel in 2-3 months and then potentially start Praluent or Repatha injections.   Your LDL is currently 71. Your LDL goal is <70.   Call us if you have any problems tolerating the ezetimibe [phone].

## 2020-05-05 ENCOUNTER — Telehealth: Payer: Self-pay | Admitting: Cardiology

## 2020-05-05 NOTE — Telephone Encounter (Signed)
Left message for patient, okay to stop zetia.

## 2020-05-05 NOTE — Telephone Encounter (Signed)
Left message for patient to call back if any other symptoms. Will route to Dr. Stanford Breed and nurse Hilda Blades to see if they want to change medications.

## 2020-05-05 NOTE — Telephone Encounter (Signed)
Pt c/o medication issue:  1. Name of Medication: ezetimibe (ZETIA) 10 MG tablet  2. How are you currently taking this medication (dosage and times per day)? Quit taking it yesterday  3. Are you having a reaction (difficulty breathing--STAT)? yes  4. What is your medication issue? Patient states that he cannot take this drug. He states that he has brain fog and his whole body feels sluggish.

## 2020-05-06 NOTE — Telephone Encounter (Signed)
Ok to stay off zetia; check lipids in 3 months; may need repatha Kirk Ruths

## 2020-05-08 ENCOUNTER — Encounter: Payer: Self-pay | Admitting: *Deleted

## 2020-05-08 ENCOUNTER — Other Ambulatory Visit: Payer: Self-pay | Admitting: *Deleted

## 2020-05-08 DIAGNOSIS — E7800 Pure hypercholesterolemia, unspecified: Secondary | ICD-10-CM

## 2020-05-08 DIAGNOSIS — I2583 Coronary atherosclerosis due to lipid rich plaque: Secondary | ICD-10-CM

## 2020-05-08 NOTE — Telephone Encounter (Signed)
Message to patient via my chart regarding follow up lab work. Lab orders mailed to the pt

## 2020-05-08 NOTE — Progress Notes (Signed)
ipi

## 2020-05-09 ENCOUNTER — Ambulatory Visit
Admission: RE | Admit: 2020-05-09 | Discharge: 2020-05-09 | Disposition: A | Discharge status: Home / Self Care | Payer: Medicare Other | Source: Ambulatory Visit | Attending: Cardiology | Admitting: Cardiology

## 2020-05-09 DIAGNOSIS — I712 Thoracic aortic aneurysm, without rupture, unspecified: Secondary | ICD-10-CM

## 2020-05-09 MED ORDER — IOPAMIDOL (ISOVUE-370) INJECTION 76%
75.0000 mL | Freq: Once | INTRAVENOUS | Status: AC | PRN
  Administered 2020-05-09: 75 mL via INTRAVENOUS

## 2020-05-18 ENCOUNTER — Telehealth: Payer: Self-pay | Admitting: Pharmacist

## 2020-05-18 ENCOUNTER — Encounter: Payer: Self-pay | Admitting: Pharmacist

## 2020-05-18 NOTE — Telephone Encounter (Signed)
Pa submited for the repatha sureclick by phone.

## 2020-05-18 NOTE — Telephone Encounter (Signed)
Pt seen in lipid clinic 1 month ago due to intolerances to pravastatin 40mg  daily and rosuvastatin 5mg  daily. He was started on Zetia 10mg  daily but called into the office 2 weeks ago with reports of brain fog and feeling sluggish. LDL on pravastatin 40mg  was 71 above goal < 70. On no lipid lowering therapy, anticipate baseline LDL is 30-50% higher.   Called pt to discuss starting Repatha which he is agreeable to. Will submit prior authorization.

## 2020-05-23 ENCOUNTER — Telehealth: Payer: Self-pay | Admitting: Pharmacist

## 2020-05-23 NOTE — Telephone Encounter (Signed)
Prior authorization for Repatha denied. Will submit appeals. Left VM for patient to call back to inform of update. Addendum: patient called back and left message, requested we leave message Left message that appeals is being sent- should hear back in 1-2 weeks

## 2020-05-25 NOTE — Telephone Encounter (Signed)
Prior authorization approved though 7/722. Patient has fed bcbs plan. He is eligible to use the $5 copay card but has to call [phone] to activate. I called and left detailed message per DPR about how to call. I requested he call me back to let me know where he would like Rx sent

## 2020-05-26 MED ORDER — REPATHA SURECLICK 140 MG/ML ~~LOC~~ SOAJ: 1.0000 "pen " | SUBCUTANEOUS | 11 refills | Status: DC

## 2020-05-26 NOTE — Telephone Encounter (Signed)
Spoke with patient, he would like the Rx sent to [facility]. He states that he was able to get his copay card. Already has paper to get his labs at [facility]. Will send rx to [facility]. Patient to call if he has any questions.

## 2020-05-26 NOTE — Addendum Note (Signed)
Addended by: Marcelle Overlie D on: 05/26/2020 11:06 AM   Modules accepted: Orders

## 2020-06-01 ENCOUNTER — Ambulatory Visit (HOSPITAL_BASED_OUTPATIENT_CLINIC_OR_DEPARTMENT_OTHER)
Admission: RE | Admit: 2020-06-01 | Discharge: 2020-06-01 | Disposition: A | Discharge status: Home / Self Care | Payer: Medicare Other | Source: Ambulatory Visit | Attending: Hematology & Oncology | Admitting: Hematology & Oncology

## 2020-06-01 ENCOUNTER — Other Ambulatory Visit: Payer: Self-pay

## 2020-06-01 DIAGNOSIS — M7122 Synovial cyst of popliteal space [Baker], left knee: Secondary | ICD-10-CM | POA: Diagnosis not present

## 2020-06-01 DIAGNOSIS — I82532 Chronic embolism and thrombosis of left popliteal vein: Secondary | ICD-10-CM | POA: Diagnosis not present

## 2020-06-01 DIAGNOSIS — I82509 Chronic embolism and thrombosis of unspecified deep veins of unspecified lower extremity: Secondary | ICD-10-CM | POA: Insufficient documentation

## 2020-06-01 DIAGNOSIS — I82542 Chronic embolism and thrombosis of left tibial vein: Secondary | ICD-10-CM | POA: Diagnosis not present

## 2020-06-01 DIAGNOSIS — I82432 Acute embolism and thrombosis of left popliteal vein: Secondary | ICD-10-CM | POA: Diagnosis not present

## 2020-06-05 ENCOUNTER — Inpatient Hospital Stay: Payer: Medicare Other | Attending: Hematology & Oncology

## 2020-06-05 ENCOUNTER — Inpatient Hospital Stay (HOSPITAL_BASED_OUTPATIENT_CLINIC_OR_DEPARTMENT_OTHER): Payer: Medicare Other | Admitting: Hematology & Oncology

## 2020-06-05 ENCOUNTER — Other Ambulatory Visit: Payer: Self-pay

## 2020-06-05 ENCOUNTER — Encounter: Payer: Self-pay | Admitting: Hematology & Oncology

## 2020-06-05 ENCOUNTER — Telehealth: Payer: Self-pay | Admitting: Pharmacist

## 2020-06-05 VITALS — BP 130/72 | HR 71 | Temp 98.8°F | Resp 18 | Wt 192.0 lb

## 2020-06-05 DIAGNOSIS — I471 Supraventricular tachycardia: Secondary | ICD-10-CM | POA: Insufficient documentation

## 2020-06-05 DIAGNOSIS — D6862 Lupus anticoagulant syndrome: Secondary | ICD-10-CM | POA: Insufficient documentation

## 2020-06-05 DIAGNOSIS — Z7901 Long term (current) use of anticoagulants: Secondary | ICD-10-CM | POA: Diagnosis not present

## 2020-06-05 DIAGNOSIS — I82451 Acute embolism and thrombosis of right peroneal vein: Secondary | ICD-10-CM | POA: Insufficient documentation

## 2020-06-05 DIAGNOSIS — I2510 Atherosclerotic heart disease of native coronary artery without angina pectoris: Secondary | ICD-10-CM

## 2020-06-05 DIAGNOSIS — I82509 Chronic embolism and thrombosis of unspecified deep veins of unspecified lower extremity: Secondary | ICD-10-CM

## 2020-06-05 DIAGNOSIS — I2583 Coronary atherosclerosis due to lipid rich plaque: Secondary | ICD-10-CM

## 2020-06-05 LAB — CBC WITH DIFFERENTIAL (CANCER CENTER ONLY)
Abs Immature Granulocytes: 0.02 10*3/uL (ref 0.00–0.07)
Basophils Absolute: 0 10*3/uL (ref 0.0–0.1)
Basophils Relative: 1 %
Eosinophils Absolute: 0.3 10*3/uL (ref 0.0–0.5)
Eosinophils Relative: 4 %
HCT: 45.4 % (ref 39.0–52.0)
Hemoglobin: 14.6 g/dL (ref 13.0–17.0)
Immature Granulocytes: 0 %
Lymphocytes Relative: 26 %
Lymphs Abs: 2.3 10*3/uL (ref 0.7–4.0)
MCH: 30.8 pg (ref 26.0–34.0)
MCHC: 32.2 g/dL (ref 30.0–36.0)
MCV: 95.8 fL (ref 80.0–100.0)
Monocytes Absolute: 0.6 10*3/uL (ref 0.1–1.0)
Monocytes Relative: 7 %
Neutro Abs: 5.4 10*3/uL (ref 1.7–7.7)
Neutrophils Relative %: 62 %
Platelet Count: 221 10*3/uL (ref 150–400)
RBC: 4.74 MIL/uL (ref 4.22–5.81)
RDW: 14.6 % (ref 11.5–15.5)
WBC Count: 8.6 10*3/uL (ref 4.0–10.5)
nRBC: 0 % (ref 0.0–0.2)

## 2020-06-05 LAB — CMP (CANCER CENTER ONLY)
ALT: 12 U/L (ref 0–44)
AST: 15 U/L (ref 15–41)
Albumin: 4.3 g/dL (ref 3.5–5.0)
Alkaline Phosphatase: 35 U/L — ABNORMAL LOW (ref 38–126)
Anion gap: 5 (ref 5–15)
BUN: 17 mg/dL (ref 8–23)
CO2: 28 mmol/L (ref 22–32)
Calcium: 9 mg/dL (ref 8.9–10.3)
Chloride: 107 mmol/L (ref 98–111)
Creatinine: 1.03 mg/dL (ref 0.61–1.24)
GFR, Est AFR Am: >60 mL/min (ref >60)
GFR, Estimated: >60 mL/min (ref >60)
Glucose, Bld: 108 mg/dL — ABNORMAL HIGH (ref 70–99)
Potassium: 4.3 mmol/L (ref 3.5–5.1)
Sodium: 140 mmol/L (ref 135–145)
Total Bilirubin: 0.7 mg/dL (ref 0.3–1.2)
Total Protein: 7.3 g/dL (ref 6.5–8.1)

## 2020-06-05 LAB — D-DIMER, QUANTITATIVE: D-Dimer, Quant: 1.59 ug/mL-FEU — ABNORMAL HIGH (ref 0.00–0.50)

## 2020-06-05 MED ORDER — REPATHA SURECLICK 140 MG/ML ~~LOC~~ SOAJ: 1.0000 "pen " | SUBCUTANEOUS | 11 refills | Status: DC

## 2020-06-05 NOTE — Progress Notes (Signed)
Hematology and Oncology Follow Up Visit  Roger Kent [id number] 08-14-1946 74 y.o. 06/05/2020   Principle Diagnosis:   DVT of the right peroneal vein  SVT of the right saphenous vein  (+) lupus anti-coagulant  Current Therapy:    Xarelto 20 mg po q day -- 1 yr of therapeutic to complete in 05/2019  Xarelto 10 mg po q day -- maintenance x 31yr -- start 05/2019 -- completed on 06/2020  EC ASA 81 mg po q day -- started on 01/11/2019 -- changed to 325 mg po q day on 06/05/2020     Interim History:  Mr. Vences is back for follow-up.  He has been quite busy since we last saw him.  He has been doing some traveling.  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  He actually brought me in another bottle of honey that is obese produce.  We did do a Doppler of his left leg.  The Doppler showed that there was chronic changes in the left leg.  There is no acute thrombus noted.  There was some chronic thrombus which is stable in the distal left superficial femoral vein, left popliteal vein and left tibioperoneal trunk.  He has been on maintenance Xarelto for a year now.  I told him to finish up what he has and then we will get him on a full dose aspirin.  I think this would be reasonable.  He has had no problems with cough or shortness of breath.  He has had no issues with nausea or vomiting.  There has been no bleeding.  He has had no change in bowel or bladder habits.  Unfortunately, it looks like he may need knee surgery.  He is seen orthopedic surgery.  He is getting injections.  He says if he needs surgery, he will try to wait until the fall.  I do not have any problems with him undergoing knee surgery.  I probably would however get him back on anticoagulation with Xarelto prior to and post surgery.    Overall, I would say his performance status is ECOG 1.    Medications:  Current Outpatient Medications:  .  Chlorpheniramine Maleate (ALLERGY PO), Take by  mouth., Disp: , Rfl:  .  cholecalciferol (VITAMIN D) 1000 UNITS tablet, Take 1,000 Units by mouth every morning., Disp: , Rfl:  .  Cyanocobalamin (VITAMIN B 12 PO), Take by mouth., Disp: , Rfl:  .  EPINEPHrine (EPIPEN 2-PAK) 0.3 mg/0.3 mL IJ SOAJ injection, Inject 0.3 mLs (0.3 mg total) into the muscle as needed for anaphylaxis., Disp: 1 Device, Rfl: 3 .  Evolocumab (REPATHA SURECLICK) 010 MG/ML SOAJ, Inject 1 pen into the skin every 14 (fourteen) days., Disp: 2 pen, Rfl: 11 .  Eyelid Cleansers (AVENOVA) 0.01 % SOLN, SMARTSIG:40 Milliliter(s) Topical Twice Daily, Disp: , Rfl:  .  ezetimibe (ZETIA) 10 MG tablet, Take 10 mg by mouth daily., Disp: , Rfl:  .  folic acid (FOLVITE) 1 MG tablet, TAKE 2 TABLETS (2MG  TOTAL) DAILY, Disp: 180 tablet, Rfl: 3 .  glucosamine-chondroitin 500-400 MG tablet, Take 2 tablets by mouth every morning. , Disp: , Rfl:  .  Homeopathic Products (STRESS/EXHAUSTION RELIEF SL), Take 2 capsules by mouth at bedtime., Disp: , Rfl:  .  ketorolac (ACULAR) 0.4 % SOLN, Apply 1 drop to eye daily., Disp: , Rfl:  .  Lactobacillus (CVS ACIDOPHILUS PO), Take by mouth daily., Disp: , Rfl:  .  Lifitegrast (XIIDRA) 5 % SOLN,  Apply to eye 2 (two) times daily., Disp: , Rfl:  .  loteprednol (LOTEMAX) 0.5 % ophthalmic suspension, 3 (three) times daily., Disp: , Rfl:  .  Multiple Vitamin (MULTIVITAMIN WITH MINERALS) TABS tablet, Take 1 tablet by mouth every morning., Disp: , Rfl:  .  Nutritional Supplements (QUINOA KALE & HEMP) LIQD, Take by mouth daily., Disp: , Rfl:  .  olmesartan (BENICAR) 40 MG tablet, TAKE 1 TABLET DAILY, Disp: 90 tablet, Rfl: 3 .  omeprazole-sodium bicarbonate (ZEGERID) 40-1100 MG per capsule, Take 1 capsule by mouth daily before breakfast., Disp: , Rfl:  .  OVER THE COUNTER MEDICATION, Take 3 capsules by mouth every morning. OTC herbal supplement for prostate health, Disp: , Rfl:  .  OVER THE COUNTER MEDICATION, Lipobiotol daily, Disp: , Rfl:  .  pravastatin (PRAVACHOL)  40 MG tablet, Take 40 mg by mouth daily., Disp: , Rfl:  .  prednisoLONE acetate (PRED FORTE) 1 % ophthalmic suspension, 1 drop 2 (two) times daily., Disp: , Rfl:  .  XARELTO 10 MG TABS tablet, TAKE 1 TABLET DAILY, Disp: 90 tablet, Rfl: 4  Allergies:  Allergies  Allergen Reactions  . Oxycodone-Acetaminophen Other (See Comments)    Other Reaction: agitation  . Crestor [Rosuvastatin]   . Zetia [Ezetimibe]     Brain fog, felt sluggish  . Lamisil [Terbinafine] Rash    Past Medical History, Surgical history, Social history, and Family History were reviewed and updated.  Review of Systems: Review of Systems  Constitutional: Negative.   HENT:  Negative.   Eyes: Negative.   Respiratory: Negative.   Cardiovascular: Negative.   Gastrointestinal: Negative.   Endocrine: Negative.   Genitourinary: Negative.    Musculoskeletal: Negative.   Skin: Negative.   Neurological: Negative.   Hematological: Negative.   Psychiatric/Behavioral: Negative.     Physical Exam:  vitals were not taken for this visit.   Wt Readings from Last 3 Encounters:  01/17/20 196 lb (88.9 kg)  12/13/19 197 lb (89.4 kg)  12/07/19 197 lb (89.4 kg)    Physical Exam Vitals reviewed.  HENT:     Head: Normocephalic and atraumatic.  Eyes:     Pupils: Pupils are equal, round, and reactive to light.  Cardiovascular:     Rate and Rhythm: Normal rate and regular rhythm.     Heart sounds: Normal heart sounds.  Pulmonary:     Effort: Pulmonary effort is normal.     Breath sounds: Normal breath sounds.  Abdominal:     General: Bowel sounds are normal.     Palpations: Abdomen is soft.  Musculoskeletal:        General: No tenderness or deformity. Normal range of motion.     Cervical back: Normal range of motion.  Lymphadenopathy:     Cervical: No cervical adenopathy.  Skin:    General: Skin is warm and dry.     Findings: No erythema or rash.  Neurological:     Mental Status: He is alert and oriented to person,  place, and time.  Psychiatric:        Behavior: Behavior normal.        Thought Content: Thought content normal.        Judgment: Judgment normal.      Lab Results  Component Value Date   WBC 8.6 06/05/2020   HGB 14.6 06/05/2020   HCT 45.4 06/05/2020   MCV 95.8 06/05/2020   PLT 221 06/05/2020     Chemistry      Component  Value Date/Time   NA 140 12/07/2019 0941   K 4.6 12/07/2019 0941   CL 104 12/07/2019 0941   CO2 29 12/07/2019 0941   BUN 17 12/07/2019 0941   CREATININE 1.07 12/07/2019 0941   CREATININE 1.05 10/28/2016 1150      Component Value Date/Time   CALCIUM 9.9 12/07/2019 0941   ALKPHOS 61 03/09/2020 0811   AST 17 03/09/2020 0811   AST 18 12/07/2019 0941   ALT 13 03/09/2020 0811   ALT 16 12/07/2019 0941   BILITOT 0.8 03/09/2020 0811   BILITOT 0.7 12/07/2019 0941       Impression and Plan: Mr. Zulauf is a 74 year old white male.  He had a thrombus in the left leg about 6 years ago.  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  I suspect he will always have this.  I think the real issue now is whether or not he is going to actually have knee surgery.  It sounds like he will need to have both knees operated on.  He is thinking about trying to do this at the same time.  Again, if he has the surgery, he will need to be on actual anticoagulation prior to surgery and post surgery probably for about 6 weeks or so.  I would like to get him back before he has surgery in the fall.  I want to do another Doppler of his left leg we see him back.    Volanda Napoleon, MD 7/19/20218:27 AM

## 2020-06-05 NOTE — Telephone Encounter (Signed)
Pt's wife here today for lipid visit, mentioned pt is required to have [place] filled at [facility] instead of local pharmacy per new [employer] F[employer] insurance criteria. They did not apply his $5 copay card and instead charged him $65. Reactivated new copay card, sent rx to specialty pharmacy, and called to confirm they can use $5 copay card.

## 2020-06-06 DIAGNOSIS — D485 Neoplasm of uncertain behavior of skin: Secondary | ICD-10-CM | POA: Diagnosis not present

## 2020-06-06 DIAGNOSIS — Z85828 Personal history of other malignant neoplasm of skin: Secondary | ICD-10-CM | POA: Diagnosis not present

## 2020-06-06 DIAGNOSIS — L570 Actinic keratosis: Secondary | ICD-10-CM | POA: Diagnosis not present

## 2020-06-06 DIAGNOSIS — L821 Other seborrheic keratosis: Secondary | ICD-10-CM | POA: Diagnosis not present

## 2020-06-06 DIAGNOSIS — D2262 Melanocytic nevi of left upper limb, including shoulder: Secondary | ICD-10-CM | POA: Diagnosis not present

## 2020-06-06 DIAGNOSIS — C44519 Basal cell carcinoma of skin of other part of trunk: Secondary | ICD-10-CM | POA: Diagnosis not present

## 2020-06-06 DIAGNOSIS — L905 Scar conditions and fibrosis of skin: Secondary | ICD-10-CM | POA: Diagnosis not present

## 2020-06-06 DIAGNOSIS — D229 Melanocytic nevi, unspecified: Secondary | ICD-10-CM | POA: Diagnosis not present

## 2020-06-06 LAB — LUPUS ANTICOAGULANT PANEL
DRVVT: 35.5 s (ref 0.0–47.0)
PTT Lupus Anticoagulant: 31.1 s (ref 0.0–51.9)

## 2020-06-07 LAB — CARDIOLIPIN ANTIBODIES, IGG, IGM, IGA
Anticardiolipin IgA: <9 APL U/mL (ref 0–11)
Anticardiolipin IgG: <9 GPL U/mL (ref 0–14)
Anticardiolipin IgM: <9 MPL U/mL (ref 0–12)

## 2020-06-26 DIAGNOSIS — M170 Bilateral primary osteoarthritis of knee: Secondary | ICD-10-CM | POA: Diagnosis not present

## 2020-07-03 DIAGNOSIS — M170 Bilateral primary osteoarthritis of knee: Secondary | ICD-10-CM | POA: Diagnosis not present

## 2020-07-10 DIAGNOSIS — M170 Bilateral primary osteoarthritis of knee: Secondary | ICD-10-CM | POA: Diagnosis not present

## 2020-07-19 DIAGNOSIS — C44519 Basal cell carcinoma of skin of other part of trunk: Secondary | ICD-10-CM | POA: Diagnosis not present

## 2020-07-19 DIAGNOSIS — L905 Scar conditions and fibrosis of skin: Secondary | ICD-10-CM | POA: Diagnosis not present

## 2020-07-20 ENCOUNTER — Telehealth: Payer: Self-pay

## 2020-07-20 ENCOUNTER — Ambulatory Visit: Payer: Medicare Other | Admitting: Internal Medicine

## 2020-07-20 NOTE — Telephone Encounter (Signed)
LVM for pt to RTN my call to schedule AWV with [facility] 

## 2020-08-01 ENCOUNTER — Encounter: Payer: Self-pay | Admitting: Internal Medicine

## 2020-08-01 ENCOUNTER — Other Ambulatory Visit: Payer: Self-pay

## 2020-08-01 ENCOUNTER — Ambulatory Visit (INDEPENDENT_AMBULATORY_CARE_PROVIDER_SITE_OTHER): Payer: Medicare Other | Admitting: Internal Medicine

## 2020-08-01 VITALS — BP 90/62 | HR 65 | Temp 98.3°F | Ht 73.0 in | Wt 188.0 lb

## 2020-08-01 DIAGNOSIS — M25561 Pain in right knee: Secondary | ICD-10-CM | POA: Diagnosis not present

## 2020-08-01 DIAGNOSIS — K2270 Barrett's esophagus without dysplasia: Secondary | ICD-10-CM

## 2020-08-01 DIAGNOSIS — I10 Essential (primary) hypertension: Secondary | ICD-10-CM | POA: Diagnosis not present

## 2020-08-01 DIAGNOSIS — R739 Hyperglycemia, unspecified: Secondary | ICD-10-CM

## 2020-08-01 DIAGNOSIS — I2510 Atherosclerotic heart disease of native coronary artery without angina pectoris: Secondary | ICD-10-CM | POA: Diagnosis not present

## 2020-08-01 DIAGNOSIS — I2583 Coronary atherosclerosis due to lipid rich plaque: Secondary | ICD-10-CM

## 2020-08-01 DIAGNOSIS — N320 Bladder-neck obstruction: Secondary | ICD-10-CM | POA: Diagnosis not present

## 2020-08-01 DIAGNOSIS — G8929 Other chronic pain: Secondary | ICD-10-CM

## 2020-08-01 DIAGNOSIS — Z23 Encounter for immunization: Secondary | ICD-10-CM | POA: Diagnosis not present

## 2020-08-01 DIAGNOSIS — I82509 Chronic embolism and thrombosis of unspecified deep veins of unspecified lower extremity: Secondary | ICD-10-CM

## 2020-08-01 DIAGNOSIS — M25562 Pain in left knee: Secondary | ICD-10-CM

## 2020-08-01 DIAGNOSIS — M545 Low back pain: Secondary | ICD-10-CM

## 2020-08-01 DIAGNOSIS — E785 Hyperlipidemia, unspecified: Secondary | ICD-10-CM | POA: Diagnosis not present

## 2020-08-01 LAB — COMPLETE METABOLIC PANEL WITH GFR
AG Ratio: 1.5 (calc) (ref 1.0–2.5)
ALT: 14 U/L (ref 9–46)
AST: 19 U/L (ref 10–35)
Albumin: 4.2 g/dL (ref 3.6–5.1)
Alkaline phosphatase (APISO): 35 U/L (ref 35–144)
BUN: 15 mg/dL (ref 7–25)
CO2: 23 mmol/L (ref 20–32)
Calcium: 9 mg/dL (ref 8.6–10.3)
Chloride: 107 mmol/L (ref 98–110)
Creat: 0.98 mg/dL (ref 0.70–1.18)
GFR, Est African American: 88 mL/min/{1.73_m2} (ref >=60)
GFR, Est Non African American: 76 mL/min/{1.73_m2} (ref >=60)
Globulin: 2.8 g/dL (calc) (ref 1.9–3.7)
Glucose, Bld: 90 mg/dL (ref 65–99)
Potassium: 4.2 mmol/L (ref 3.5–5.3)
Sodium: 140 mmol/L (ref 135–146)
Total Bilirubin: 0.7 mg/dL (ref 0.2–1.2)
Total Protein: 7 g/dL (ref 6.1–8.1)

## 2020-08-01 MED ORDER — OLMESARTAN MEDOXOMIL 40 MG PO TABS: 40.0000 mg | ORAL_TABLET | Freq: Every day | ORAL | 3 refills | Status: DC

## 2020-08-01 NOTE — Assessment & Plan Note (Signed)
Benicar 

## 2020-08-01 NOTE — Assessment & Plan Note (Signed)
Start PT 

## 2020-08-01 NOTE — Addendum Note (Signed)
Addended by: Darlys Gales on: 08/01/2020 09:37 AM   Modules accepted: Orders

## 2020-08-01 NOTE — Addendum Note (Signed)
Addended by: Hazle Quant on: 08/01/2020 09:25 AM   Modules accepted: Orders

## 2020-08-01 NOTE — Progress Notes (Signed)
Subjective:  Patient ID: Roger Kent, male    DOB: [birth year]  Age: 74 y.o. MRN: [id number]  CC: No chief complaint on file.   HPI [facility]ouche Roane presents for knee OA, LBP, anticoagulation, HTN  Outpatient Medications Prior to Visit  Medication Sig Dispense Refill  . Chlorpheniramine Maleate (ALLERGY PO) Take by mouth.    . cholecalciferol (VITAMIN D) 1000 UNITS tablet Take 1,000 Units by mouth every morning.    . Cyanocobalamin (VITAMIN B 12 PO) Take by mouth.    . EPINEPHrine (EPIPEN 2-PAK) 0.3 mg/0.3 mL IJ SOAJ injection Inject 0.3 mLs (0.3 mg total) into the muscle as needed for anaphylaxis. 1 Device 3  . Evolocumab (REPATHA SURECLICK) 409 MG/ML SOAJ Inject 1 pen into the skin every 14 (fourteen) days. 2 pen 11  . Eyelid Cleansers (AVENOVA) 0.01 % SOLN SMARTSIG:40 Milliliter(s) Topical Twice Daily    . ezetimibe (ZETIA) 10 MG tablet Take 10 mg by mouth daily.    Marland Kitchen glucosamine-chondroitin 500-400 MG tablet Take 2 tablets by mouth every morning.     . Homeopathic Products (STRESS/EXHAUSTION RELIEF SL) Take 2 capsules by mouth at bedtime.    Marland Kitchen ketorolac (ACULAR) 0.4 % SOLN Apply 1 drop to eye daily.    . Lactobacillus (CVS ACIDOPHILUS PO) Take by mouth daily.    Marland Kitchen Lifitegrast (XIIDRA) 5 % SOLN Apply to eye 2 (two) times daily.    Marland Kitchen loteprednol (LOTEMAX) 0.5 % ophthalmic suspension 3 (three) times daily.    . Multiple Vitamin (MULTIVITAMIN WITH MINERALS) TABS tablet Take 1 tablet by mouth every morning.    . Nutritional Supplements (QUINOA KALE & HEMP) LIQD Take by mouth daily.    Marland Kitchen omeprazole-sodium bicarbonate (ZEGERID) 40-1100 MG per capsule Take 1 capsule by mouth daily before breakfast.    . OVER THE COUNTER MEDICATION Take 3 capsules by mouth every morning. OTC herbal supplement for prostate health    . OVER THE COUNTER MEDICATION Lipobiotol daily    . pravastatin (PRAVACHOL) 40 MG tablet Take 40 mg by mouth daily.    . prednisoLONE acetate (PRED FORTE) 1 %  ophthalmic suspension 1 drop 2 (two) times daily.    Marland Kitchen olmesartan (BENICAR) 40 MG tablet TAKE 1 TABLET DAILY 90 tablet 3  . folic acid (FOLVITE) 1 MG tablet TAKE 2 TABLETS (2MG  TOTAL) DAILY (Patient not taking: Reported on 08/01/2020) 180 tablet 3  . XARELTO 10 MG TABS tablet TAKE 1 TABLET DAILY (Patient not taking: Reported on 08/01/2020) 90 tablet 4   No facility-administered medications prior to visit.    ROS: Review of Systems  Constitutional: Negative for appetite change, fatigue and unexpected weight change.  HENT: Negative for congestion, nosebleeds, sneezing, sore throat and trouble swallowing.   Eyes: Negative for itching and visual disturbance.  Respiratory: Negative for cough.   Cardiovascular: Negative for chest pain, palpitations and leg swelling.  Gastrointestinal: Negative for abdominal distention, blood in stool, diarrhea and nausea.  Genitourinary: Negative for frequency and hematuria.  Musculoskeletal: Positive for arthralgias, back pain and gait problem. Negative for joint swelling and neck pain.  Skin: Negative for rash.  Neurological: Negative for dizziness, tremors, speech difficulty and weakness.  Psychiatric/Behavioral: Negative for agitation, dysphoric mood, sleep disturbance and suicidal ideas. The patient is not nervous/anxious.     Objective:  BP 90/62 (BP Location: Right Arm, Patient Position: Sitting, Cuff Size: Large)   Pulse 65   Temp 98.3 F (36.8 C) (Oral)   Ht 6\' 1"  (1.854 m)  Wt 188 lb (85.3 kg)   SpO2 96%   BMI 24.80 kg/m   BP Readings from Last 3 Encounters:  08/01/20 90/62  06/05/20 130/72  01/17/20 134/84    Wt Readings from Last 3 Encounters:  08/01/20 188 lb (85.3 kg)  06/05/20 192 lb (87.1 kg)  01/17/20 196 lb (88.9 kg)    Physical Exam Constitutional:      General: He is not in acute distress.    Appearance: He is well-developed.     Comments: NAD  Eyes:     Conjunctiva/sclera: Conjunctivae normal.     Pupils: Pupils are  equal, round, and reactive to light.  Neck:     Thyroid: No thyromegaly.     Vascular: No JVD.  Cardiovascular:     Rate and Rhythm: Normal rate and regular rhythm.     Heart sounds: Murmur heard.  No friction rub. No gallop.   Pulmonary:     Effort: Pulmonary effort is normal. No respiratory distress.     Breath sounds: Normal breath sounds. No wheezing or rales.  Chest:     Chest wall: No tenderness.  Abdominal:     General: Bowel sounds are normal. There is no distension.     Palpations: Abdomen is soft. There is no mass.     Tenderness: There is no abdominal tenderness. There is no guarding or rebound.  Musculoskeletal:        General: Tenderness present. Normal range of motion.     Cervical back: Normal range of motion.  Lymphadenopathy:     Cervical: No cervical adenopathy.  Skin:    General: Skin is warm and dry.     Findings: No rash.  Neurological:     Mental Status: He is alert and oriented to person, place, and time.     Cranial Nerves: No cranial nerve deficit.     Motor: No abnormal muscle tone.     Coordination: Coordination normal.     Gait: Gait normal.     Deep Tendon Reflexes: Reflexes are normal and symmetric.  Psychiatric:        Behavior: Behavior normal.        Thought Content: Thought content normal.        Judgment: Judgment normal.    B knees - tender w/ROM  Lab Results  Component Value Date   WBC 8.6 06/05/2020   HGB 14.6 06/05/2020   HCT 45.4 06/05/2020   PLT 221 06/05/2020   GLUCOSE 108 (H) 06/05/2020   CHOL 126 03/09/2020   TRIG 63 03/09/2020   HDL 42 03/09/2020   LDLCALC 71 03/09/2020   ALT 12 06/05/2020   AST 15 06/05/2020   NA 140 06/05/2020   K 4.3 06/05/2020   CL 107 06/05/2020   CREATININE 1.03 06/05/2020   BUN 17 06/05/2020   CO2 28 06/05/2020   TSH 2.37 12/29/2018   PSA 9.86 (H) 08/24/2018   INR 2.4 RATIO (H) 09/17/2007   HGBA1C 5.8 12/29/2018    US Venous Img Lower Bilateral (DVT)  Result Date:  06/01/2020 CLINICAL DATA:  Prior chronic DVT on the left. Prior superficial vein thrombosis along the right ankle. EXAM: Bilateral LOWER EXTREMITY VENOUS DOPPLER ULTRASOUND TECHNIQUE: Gray-scale sonography with compression, as well as color and duplex ultrasound, were performed to evaluate the deep venous system(s) from the level of the common femoral vein through the popliteal and proximal calf veins. COMPARISON:  05/11/2019 FINDINGS: VENOUS Right-side: Normal compressibility of the common femoral, superficial femoral, and  popliteal veins, as well as the visualized calf veins. Visualized portions of profunda femoral vein and great saphenous vein unremarkable. No filling defects to suggest DVT on grayscale or color Doppler imaging. Doppler waveforms show normal direction of venous flow, normal respiratory plasticity and response to augmentation. Left side: Normal appearance the common femoral vein and profundus femoral vein. There is evidence of chronic webbing in the distal left superficial femoral vein compatible with remote chronic DVT. There is also evidence of chronic thrombus within the popliteal vein, posterior tibial vein, and peroneal vein. However, this is a similar appearance to the prior exam, and no acute DVT is identified. OTHER None. Limitations: Bilateral Baker's cysts are observed IMPRESSION: 1. Stable appearance compared to the 05/11/2019 exam, with chronic findings in the distal left superficial femoral vein, left popliteal vein, and along the left tibioperoneal trunk compatible with chronic thrombus and webbing from an old DVT. No acute DVT is identified. 2. Bilateral Baker's cysts. Electronically Signed   By: Van Clines M.D.   On: 06/01/2020 09:43    Assessment & Plan:   There are no diagnoses linked to this encounter.   Meds ordered this encounter  Medications  . olmesartan (BENICAR) 40 MG tablet    Sig: Take 1 tablet (40 mg total) by mouth daily.    Dispense:  90 tablet     Refill:  3     Follow-up: No follow-ups on file.  Walker Kehr, MD

## 2020-08-01 NOTE — Patient Instructions (Signed)
[facility] started vaccine booster sign up. Please call [facility] at [phone]. You can also call the venue where you had your initial COVID 19 vaccination. Thanks, AP

## 2020-08-01 NOTE — Assessment & Plan Note (Signed)
On Zegerid

## 2020-08-01 NOTE — Assessment & Plan Note (Signed)
Xarelto

## 2020-08-01 NOTE — Assessment & Plan Note (Signed)
Lost wt °

## 2020-08-01 NOTE — Assessment & Plan Note (Signed)
PT

## 2020-08-01 NOTE — Assessment & Plan Note (Signed)
Pravastatin  

## 2020-08-02 ENCOUNTER — Other Ambulatory Visit: Payer: Self-pay | Admitting: Internal Medicine

## 2020-08-02 LAB — LIPID PANEL
Cholesterol: 101 mg/dL (ref <200)
HDL: 36 mg/dL — ABNORMAL LOW (ref >=40)
LDL Cholesterol (Calc): 44 mg/dL (calc)
Non-HDL Cholesterol (Calc): 65 mg/dL (calc) (ref <130)
Total CHOL/HDL Ratio: 2.8 (calc) (ref <5.0)
Triglycerides: 129 mg/dL (ref <150)

## 2020-08-02 LAB — URINALYSIS
Bilirubin Urine: NEGATIVE
Glucose, UA: NEGATIVE
Hgb urine dipstick: NEGATIVE
Ketones, ur: NEGATIVE
Nitrite: NEGATIVE
Protein, ur: NEGATIVE
Specific Gravity, Urine: 1.01 (ref 1.001–1.03)
pH: 6.5 (ref 5.0–8.0)

## 2020-08-02 LAB — CBC WITH DIFFERENTIAL/PLATELET
Absolute Monocytes: 917 cells/uL (ref 200–950)
Basophils Absolute: 21 cells/uL (ref 0–200)
Basophils Relative: 0.4 %
Eosinophils Absolute: 53 cells/uL (ref 15–500)
Eosinophils Relative: 1 %
HCT: 45.9 % (ref 38.5–50.0)
Hemoglobin: 14.8 g/dL (ref 13.2–17.1)
Lymphs Abs: 1484 cells/uL (ref 850–3900)
MCH: 30.4 pg (ref 27.0–33.0)
MCHC: 32.2 g/dL (ref 32.0–36.0)
MCV: 94.3 fL (ref 80.0–100.0)
MPV: 9.3 fL (ref 7.5–12.5)
Monocytes Relative: 17.3 %
Neutro Abs: 2825 cells/uL (ref 1500–7800)
Neutrophils Relative %: 53.3 %
Platelets: 217 10*3/uL (ref 140–400)
RBC: 4.87 10*6/uL (ref 4.20–5.80)
RDW: 13.8 % (ref 11.0–15.0)
Total Lymphocyte: 28 %
WBC: 5.3 10*3/uL (ref 3.8–10.8)

## 2020-08-02 LAB — TSH: TSH: 2.06 mIU/L (ref 0.40–4.50)

## 2020-08-02 LAB — PSA: PSA: 8.23 ng/mL — ABNORMAL HIGH (ref <=4.0)

## 2020-08-02 MED ORDER — CIPROFLOXACIN HCL 500 MG PO TABS: 500.0000 mg | ORAL_TABLET | Freq: Two times a day (BID) | ORAL | 0 refills | Status: DC

## 2020-08-14 ENCOUNTER — Encounter (INDEPENDENT_AMBULATORY_CARE_PROVIDER_SITE_OTHER): Payer: Medicare Other | Admitting: Ophthalmology

## 2020-08-14 ENCOUNTER — Other Ambulatory Visit: Payer: Self-pay

## 2020-08-14 DIAGNOSIS — I10 Essential (primary) hypertension: Secondary | ICD-10-CM | POA: Diagnosis not present

## 2020-08-14 DIAGNOSIS — H35033 Hypertensive retinopathy, bilateral: Secondary | ICD-10-CM

## 2020-08-14 DIAGNOSIS — H338 Other retinal detachments: Secondary | ICD-10-CM | POA: Diagnosis not present

## 2020-08-14 DIAGNOSIS — H35031 Hypertensive retinopathy, right eye: Secondary | ICD-10-CM

## 2020-08-14 DIAGNOSIS — H43813 Vitreous degeneration, bilateral: Secondary | ICD-10-CM

## 2020-08-15 DIAGNOSIS — H40013 Open angle with borderline findings, low risk, bilateral: Secondary | ICD-10-CM | POA: Diagnosis not present

## 2020-08-15 DIAGNOSIS — I2583 Coronary atherosclerosis due to lipid rich plaque: Secondary | ICD-10-CM | POA: Diagnosis not present

## 2020-08-15 DIAGNOSIS — I2510 Atherosclerotic heart disease of native coronary artery without angina pectoris: Secondary | ICD-10-CM | POA: Diagnosis not present

## 2020-08-15 DIAGNOSIS — Z20822 Contact with and (suspected) exposure to covid-19: Secondary | ICD-10-CM | POA: Diagnosis not present

## 2020-08-15 DIAGNOSIS — E7800 Pure hypercholesterolemia, unspecified: Secondary | ICD-10-CM | POA: Diagnosis not present

## 2020-08-15 LAB — HEPATIC FUNCTION PANEL
ALT: 17 IU/L (ref 0–44)
AST: 28 IU/L (ref 0–40)
Albumin: 4.4 g/dL (ref 3.7–4.7)
Alkaline Phosphatase: 41 IU/L — ABNORMAL LOW (ref 44–121)
Bilirubin Total: 0.7 mg/dL (ref 0.0–1.2)
Bilirubin, Direct: 0.23 mg/dL (ref 0.00–0.40)
Total Protein: 7.7 g/dL (ref 6.0–8.5)

## 2020-08-15 LAB — LIPID PANEL
Chol/HDL Ratio: 2.4 ratio (ref 0.0–5.0)
Cholesterol, Total: 94 mg/dL — ABNORMAL LOW (ref 100–199)
HDL: 39 mg/dL — ABNORMAL LOW (ref >39)
LDL Chol Calc (NIH): 42 mg/dL (ref 0–99)
Triglycerides: 56 mg/dL (ref 0–149)
VLDL Cholesterol Cal: 13 mg/dL (ref 5–40)

## 2020-08-16 ENCOUNTER — Ambulatory Visit: Payer: Medicare Other

## 2020-08-16 ENCOUNTER — Telehealth: Payer: Self-pay | Admitting: Internal Medicine

## 2020-08-16 NOTE — Telephone Encounter (Signed)
LVM for pt to rtn my call to R/S appt with [facility] for 08/16/20

## 2020-08-17 ENCOUNTER — Telehealth: Payer: Self-pay | Admitting: Internal Medicine

## 2020-08-17 NOTE — Telephone Encounter (Signed)
covid positive no symptoms  Should patient get a test in 10 days after quarantine should he and could be done at this office  Active on my chart, can respond.

## 2020-08-17 NOTE — Telephone Encounter (Signed)
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

## 2020-08-18 NOTE — Telephone Encounter (Signed)
Please adivse

## 2020-08-20 NOTE — Telephone Encounter (Signed)
It has been addressed in [employer].

## 2020-08-23 DIAGNOSIS — Z20822 Contact with and (suspected) exposure to covid-19: Secondary | ICD-10-CM | POA: Diagnosis not present

## 2020-08-24 ENCOUNTER — Other Ambulatory Visit: Payer: Medicare Other

## 2020-09-01 ENCOUNTER — Ambulatory Visit (INDEPENDENT_AMBULATORY_CARE_PROVIDER_SITE_OTHER): Payer: Medicare Other

## 2020-09-01 DIAGNOSIS — Z0000 Encounter for general adult medical examination without abnormal findings: Secondary | ICD-10-CM | POA: Diagnosis not present

## 2020-09-01 NOTE — Patient Instructions (Signed)
Roger Kent , Thank you for taking time to come for your Medicare Wellness Visit. I appreciate your ongoing commitment to your health goals. Please review the following plan we discussed and let me know if I can assist you in the future.   Screening recommendations/referrals: Colonoscopy: 05/20/2013; due every 10 years Recommended yearly ophthalmology/optometry visit for glaucoma screening and checkup Recommended yearly dental visit for hygiene and checkup  Vaccinations: Influenza vaccine: 08/01/2020 Pneumococcal vaccine: up to date Tdap vaccine: 07/03/2013 Shingles vaccine: up to date   Covid-19: up to date  Advanced directives: Please bring a copy of your health care power of attorney and living will to the office at your convenience.  Conditions/risks identified: Yes; Reviewed health maintenance screenings with patient today and relevant education, vaccines, and/or referrals were provided. Please continue to do your personal lifestyle choices by: daily care of teeth and gums, regular physical activity (goal should be 5 days a week for 30 minutes), eat a healthy diet, avoid tobacco and drug use, limiting any alcohol intake, taking a low-dose aspirin (if not allergic or have been advised by your provider otherwise) and taking vitamins and minerals as recommended by your provider. Continue doing brain stimulating activities (puzzles, reading, adult coloring books, staying active) to keep memory sharp. Continue to eat heart healthy diet (full of fruits, vegetables, whole grains, lean protein, water--limit salt, fat, and sugar intake) and increase physical activity as tolerated.  Next appointment: Please schedule your next Medicare Wellness Visit in 1 year.  Preventive Care 39 Years and Older, Male Preventive care refers to lifestyle choices and visits with your health care provider that can promote health and wellness. What does preventive care include?  A yearly physical exam. This is also  called an annual well check.  Dental exams once or twice a year.  Routine eye exams. Ask your health care provider how often you should have your eyes checked.  Personal lifestyle choices, including:  Daily care of your teeth and gums.  Regular physical activity.  Eating a healthy diet.  Avoiding tobacco and drug use.  Limiting alcohol use.  Practicing safe sex.  Taking low doses of aspirin every day.  Taking vitamin and mineral supplements as recommended by your health care provider. What happens during an annual well check? The services and screenings done by your health care provider during your annual well check will depend on your age, overall health, lifestyle risk factors, and family history of disease. Counseling  Your health care provider may ask you questions about your:  Alcohol use.  Tobacco use.  Drug use.  Emotional well-being.  Home and relationship well-being.  Sexual activity.  Eating habits.  History of falls.  Memory and ability to understand (cognition).  Work and work Statistician. Screening  You may have the following tests or measurements:  Height, weight, and BMI.  Blood pressure.  Lipid and cholesterol levels. These may be checked every 5 years, or more frequently if you are over 34 years old.  Skin check.  Lung cancer screening. You may have this screening every year starting at age 59 if you have a 30-pack-year history of smoking and currently smoke or have quit within the past 15 years.  Fecal occult blood test (FOBT) of the stool. You may have this test every year starting at age 46.  Flexible sigmoidoscopy or colonoscopy. You may have a sigmoidoscopy every 5 years or a colonoscopy every 10 years starting at age 51.  Prostate cancer screening. Recommendations will vary  depending on your family history and other risks.  Hepatitis C blood test.  Hepatitis B blood test.  Sexually transmitted disease (STD)  testing.  Diabetes screening. This is done by checking your blood sugar (glucose) after you have not eaten for a while (fasting). You may have this done every 1-3 years.  Abdominal aortic aneurysm (AAA) screening. You may need this if you are a current or former smoker.  Osteoporosis. You may be screened starting at age 110 if you are at high risk. Talk with your health care provider about your test results, treatment options, and if necessary, the need for more tests. Vaccines  Your health care provider may recommend certain vaccines, such as:  Influenza vaccine. This is recommended every year.  Tetanus, diphtheria, and acellular pertussis (Tdap, Td) vaccine. You may need a Td booster every 10 years.  Zoster vaccine. You may need this after age 59.  Pneumococcal 13-valent conjugate (PCV13) vaccine. One dose is recommended after age 90.  Pneumococcal polysaccharide (PPSV23) vaccine. One dose is recommended after age 24. Talk to your health care provider about which screenings and vaccines you need and how often you need them. This information is not intended to replace advice given to you by your health care provider. Make sure you discuss any questions you have with your health care provider. Document Released: 12/01/2015 Document Revised: 07/24/2016 Document Reviewed: 09/05/2015 Elsevier Interactive Patient Education  2017 [place] Prevention in the Home Falls can cause injuries. They can happen to people of all ages. There are many things you can do to make your home safe and to help prevent falls. What can I do on the outside of my home?  Regularly fix the edges of walkways and driveways and fix any cracks.  Remove anything that might make you trip as you walk through a door, such as a raised step or threshold.  Trim any bushes or trees on the path to your home.  Use bright outdoor lighting.  Clear any walking paths of anything that might make someone trip, such as  rocks or tools.  Regularly check to see if handrails are loose or broken. Make sure that both sides of any steps have handrails.  Any raised decks and porches should have guardrails on the edges.  Have any leaves, snow, or ice cleared regularly.  Use sand or salt on walking paths during winter.  Clean up any spills in your garage right away. This includes oil or grease spills. What can I do in the bathroom?  Use night lights.  Install grab bars by the toilet and in the tub and shower. Do not use towel bars as grab bars.  Use non-skid mats or decals in the tub or shower.  If you need to sit down in the shower, use a plastic, non-slip stool.  Keep the floor dry. Clean up any water that spills on the floor as soon as it happens.  Remove soap buildup in the tub or shower regularly.  Attach bath mats securely with double-sided non-slip rug tape.  Do not have throw rugs and other things on the floor that can make you trip. What can I do in the bedroom?  Use night lights.  Make sure that you have a light by your bed that is easy to reach.  Do not use any sheets or blankets that are too big for your bed. They should not hang down onto the floor.  Have a firm chair that has side  arms. You can use this for support while you get dressed.  Do not have throw rugs and other things on the floor that can make you trip. What can I do in the kitchen?  Clean up any spills right away.  Avoid walking on wet floors.  Keep items that you use a lot in easy-to-reach places.  If you need to reach something above you, use a strong step stool that has a grab bar.  Keep electrical cords out of the way.  Do not use floor polish or wax that makes floors slippery. If you must use wax, use non-skid floor wax.  Do not have throw rugs and other things on the floor that can make you trip. What can I do with my stairs?  Do not leave any items on the stairs.  Make sure that there are handrails on  both sides of the stairs and use them. Fix handrails that are broken or loose. Make sure that handrails are as long as the stairways.  Check any carpeting to make sure that it is firmly attached to the stairs. Fix any carpet that is loose or worn.  Avoid having throw rugs at the top or bottom of the stairs. If you do have throw rugs, attach them to the floor with carpet tape.  Make sure that you have a light switch at the top of the stairs and the bottom of the stairs. If you do not have them, ask someone to add them for you. What else can I do to help prevent falls?  Wear shoes that:  Do not have high heels.  Have rubber bottoms.  Are comfortable and fit you well.  Are closed at the toe. Do not wear sandals.  If you use a stepladder:  Make sure that it is fully opened. Do not climb a closed stepladder.  Make sure that both sides of the stepladder are locked into place.  Ask someone to hold it for you, if possible.  Clearly mark and make sure that you can see:  Any grab bars or handrails.  First and last steps.  Where the edge of each step is.  Use tools that help you move around (mobility aids) if they are needed. These include:  Canes.  Walkers.  Scooters.  Crutches.  Turn on the lights when you go into a dark area. Replace any light bulbs as soon as they burn out.  Set up your furniture so you have a clear path. Avoid moving your furniture around.  If any of your floors are uneven, fix them.  If there are any pets around you, be aware of where they are.  Review your medicines with your doctor. Some medicines can make you feel dizzy. This can increase your chance of falling. Ask your doctor what other things that you can do to help prevent falls. This information is not intended to replace advice given to you by your health care provider. Make sure you discuss any questions you have with your health care provider. Document Released: 08/31/2009 Document  Revised: 04/11/2016 Document Reviewed: 12/09/2014 Elsevier Interactive Patient Education  2017 [employer].

## 2020-09-01 NOTE — Progress Notes (Signed)
I connected with Roger Kent today by telephone and verified that I am speaking with the correct person using two identifiers. Location patient: home Location provider: work Persons participating in the virtual visit: Branden Shallenberger and [employer], LPN.   I discussed the limitations, risks, security and privacy concerns of performing an evaluation and management service by telephone and the availability of in person appointments. I also discussed with the patient that there may be a patient responsible charge related to this service. The patient expressed understanding and verbally consented to this telephonic visit.    Interactive audio and video telecommunications were attempted between this provider and patient, however failed, due to patient having technical difficulties OR patient did not have access to video capability.  We continued and completed visit with audio only.  Some vital signs may be absent or patient reported.   Time Spent with patient on telephone encounter: 20 minutes  Subjective:   Roger Kent is a 74 y.o. male who presents for Medicare Annual/Subsequent preventive examination.  Review of Systems    No ROS. Medicare Wellness Visit. Cardiac Risk Factors include: advanced age (>92men, >24 women);dyslipidemia;hypertension;family history of premature cardiovascular disease;male gender     Objective:    There were no vitals filed for this visit. There is no height or weight on file to calculate BMI.  Advanced Directives 09/01/2020 12/07/2019 08/10/2019 05/11/2019 01/11/2019 09/21/2018 07/15/2018  Does Patient Have a Medical Advance Directive? Yes Yes Yes No Yes Yes Yes  Type of Advance Directive Living will;Healthcare Power of [place];Living will [place];Living will - [place];Living will [place];Living will [place];Living will  Does  patient want to make changes to medical advance directive? No - Patient declined - No - Patient declined - - - -  Copy of [place] in Chart? No - copy requested No - copy requested No - copy requested - - - -    Current Medications (verified) Outpatient Encounter Medications as of 09/01/2020  Medication Sig  . Chlorpheniramine Maleate (ALLERGY PO) Take by mouth.  . cholecalciferol (VITAMIN D) 1000 UNITS tablet Take 1,000 Units by mouth every morning.  . ciprofloxacin (CIPRO) 500 MG tablet Take 1 tablet (500 mg total) by mouth 2 (two) times daily.  . Cyanocobalamin (VITAMIN B 12 PO) Take by mouth.  . EPINEPHrine (EPIPEN 2-PAK) 0.3 mg/0.3 mL IJ SOAJ injection Inject 0.3 mLs (0.3 mg total) into the muscle as needed for anaphylaxis.  . Evolocumab (REPATHA SURECLICK) 169 MG/ML SOAJ Inject 1 pen into the skin every 14 (fourteen) days.  . Eyelid Cleansers (AVENOVA) 0.01 % SOLN SMARTSIG:40 Milliliter(s) Topical Twice Daily  . ezetimibe (ZETIA) 10 MG tablet Take 10 mg by mouth daily.  Marland Kitchen glucosamine-chondroitin 500-400 MG tablet Take 2 tablets by mouth every morning.   . Homeopathic Products (STRESS/EXHAUSTION RELIEF SL) Take 2 capsules by mouth at bedtime.  Marland Kitchen ketorolac (ACULAR) 0.4 % SOLN Apply 1 drop to eye daily.  . Lactobacillus (CVS ACIDOPHILUS PO) Take by mouth daily.  Marland Kitchen Lifitegrast (XIIDRA) 5 % SOLN Apply to eye 2 (two) times daily.  Marland Kitchen loteprednol (LOTEMAX) 0.5 % ophthalmic suspension 3 (three) times daily.  . Multiple Vitamin (MULTIVITAMIN WITH MINERALS) TABS tablet Take 1 tablet by mouth every morning.  . Nutritional Supplements (QUINOA KALE & HEMP) LIQD Take by mouth daily.  Marland Kitchen olmesartan (BENICAR) 40 MG tablet Take 1 tablet (40 mg total) by mouth daily.  Marland Kitchen  omeprazole-sodium bicarbonate (ZEGERID) 40-1100 MG per capsule Take 1 capsule by mouth daily before breakfast.  . OVER THE COUNTER MEDICATION Take 3 capsules by mouth every morning. OTC herbal supplement for prostate  health  . OVER THE COUNTER MEDICATION Lipobiotol daily  . pravastatin (PRAVACHOL) 40 MG tablet Take 40 mg by mouth daily.  . prednisoLONE acetate (PRED FORTE) 1 % ophthalmic suspension 1 drop 2 (two) times daily.   No facility-administered encounter medications on file as of 09/01/2020.    Allergies (verified) Oxycodone-acetaminophen, Crestor [rosuvastatin], Zetia [ezetimibe], and Lamisil [terbinafine]   History: Past Medical History:  Diagnosis Date  . Acute hepatitis C without mention of hepatic coma(070.51)   . Allergic rhinitis, cause unspecified   . Anxiety state, unspecified   . Barrett's esophagus   . BPH (benign prostatic hyperplasia)   . DVT (deep venous thrombosis) ([place])   . Elevated PSA    Dr Karsten Ro  . Esophageal reflux   . Hiatal hernia   . Hyperlipidemia   . Hypertension   . Hypertonicity of bladder   . Insomnia   . Leg DVT (deep venous thromboembolism), chronic, unspecified laterality ([place]) 09/21/2018  . Osteoporosis, unspecified   . Personal history of urinary calculi   . Recent retinal detachment, partial, with giant tear   . Unspecified tinnitus   . Ventricular septal defect    Past Surgical History:  Procedure Laterality Date  . KNEE SURGERY    . RETINAL DETACHMENT SURGERY     Family History  Problem Relation Age of Onset  . Hypertension Other   . Alcohol abuse Neg Hx    Social History   Socioeconomic History  . Marital status: Married    Spouse name: Darcell Sabino  . Number of children: Not on file  . Years of education: Not on file  . Highest education level: Not on file  Occupational History  . 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 Occupation: bee keepin  Tobacco Use  . Smoking status: Former Research scientist (life sciences)  . Smokeless tobacco: Never Used  Vaping Use  . Vaping Use: Never used  Substance and Sexual Activity  . Alcohol use: No  . Drug use: No  . Sexual activity: Yes  Other Topics Concern  . Not on file    Social History Narrative               Social Determinants of Health   Financial Resource Strain: Low Risk   . Difficulty of Paying Living Expenses: Not hard at all  Food Insecurity: No Food Insecurity  . Worried About Charity fundraiser in the Last Year: Never true  . Ran Out of Food in the Last Year: Never true  Transportation Needs: No Transportation Needs  . Lack of Transportation (Medical): No  . Lack of Transportation (Non-Medical): No  Physical Activity: Sufficiently Active  . Days of Exercise per Week: 5 days  . Minutes of Exercise per Session: 30 min  Stress: No Stress Concern Present  . Feeling of Stress : Not at all  Social Connections:   . Frequency of Communication with Friends and Family: Not on file  . Frequency of Social Gatherings with Friends and Family: Not on file  . Attends Religious Services: Not on file  . Active Member of Clubs or Organizations: Not on file  . Attends Archivist Meetings: Not on file  . Marital Status: Not on file    Tobacco Counseling  Counseling given: Not Answered   Clinical Intake:  Pre-visit preparation completed: Yes  Pain : No/denies pain     Nutritional Risks: None Diabetes: No  How often do you need to have someone help you when you read instructions, pamphlets, or other written materials from your doctor or pharmacy?: 1 - Never What is the last grade level you completed in school?: Some College  Diabetic? no  Interpreter Needed?: No  Information entered by :: Lisette Abu, LPN   Activities of Daily Living In your present state of health, do you have any difficulty performing the following activities: 09/01/2020  Hearing? N  Vision? N  Difficulty concentrating or making decisions? N  Walking or climbing stairs? N  Dressing or bathing? N  Doing errands, shopping? N  Preparing Food and eating ? N  Using the Toilet? N  In the past six months, have you accidently leaked urine? N  Do you  have problems with loss of bowel control? N  Managing your Medications? N  Managing your Finances? N  Housekeeping or managing your Housekeeping? N  Some recent data might be hidden    Patient Care Team: Plotnikov, Evie Lacks, MD as PCP - General Kathie Rhodes, MD (Urology) Richmond Campbell, MD (Gastroenterology) Stanford Breed Denice Bors, MD (Cardiology) Vevelyn Royals, MD as Consulting Physician (Ophthalmology) Elouise Munroe, MD as Consulting Physician (Cardiology)  Indicate any recent Medical Services you may have received from other than [facility] providers in the past year (date may be approximate).     Assessment:   This is a routine wellness examination for [place].  Hearing/Vision screen No exam data present  Dietary issues and exercise activities discussed: Current Exercise Habits: Structured exercise class, Type of exercise: strength training/weights;treadmill;walking;stretching, Time (Minutes): 30, Frequency (Times/Week): 5, Weekly Exercise (Minutes/Week): 150, Intensity: Moderate, Exercise limited by: None identified  Goals   None    Depression Screen PHQ 2/9 Scores 09/01/2020 08/01/2020 07/15/2019 04/06/2018 11/05/2016 08/21/2015  PHQ - 2 Score 0 0 0 0 0 0    Fall Risk Fall Risk  09/01/2020 08/01/2020 07/15/2019 04/06/2018 11/05/2016  Falls in the past year? 0 0 0 No No  Number falls in past yr: 0 0 0 - -  Injury with Fall? 0 0 0 - -  Risk for fall due to : No Fall Risks - - - -  Follow up Falls evaluation completed - - - -    Any stairs in or around the home? Yes  If so, are there any without handrails? No  Home free of loose throw rugs in walkways, pet beds, electrical cords, etc? Yes  Adequate lighting in your home to reduce risk of falls? Yes   ASSISTIVE DEVICES UTILIZED TO PREVENT FALLS:  Life alert? No  Use of a cane, walker or w/c? No  Grab bars in the bathroom? No  Shower chair or bench in shower? No  Elevated toilet seat or a handicapped toilet? No    TIMED UP AND GO:  Was the test performed? No .  Length of time to ambulate 10 feet: 0 sec.   Gait steady and fast without use of assistive device  Cognitive Function:        Immunizations Immunization History  Administered Date(s) Administered  . Fluad Quad(high Dose 65+) 07/15/2019, 08/01/2020  . Influenza Split 09/16/2011, 08/04/2012  . Influenza Whole 09/07/2007, 08/25/2008, 08/29/2009, 07/13/2010  . Influenza, High Dose Seasonal PF 09/07/2016, 07/14/2017, 08/24/2018  . Influenza,inj,Quad PF,6+ Mos 08/17/2013, 11/30/2014, 08/21/2015  . PFIZER  SARS-COV-2 Vaccination 12/30/2019, 01/23/2020  . Pneumococcal Conjugate-13 11/09/2013  . Pneumococcal Polysaccharide-23 11/13/2007, 11/22/2015  . Td 10/21/2012  . Tdap 07/03/2013  . Zoster 10/29/2012  . Zoster Recombinat (Shingrix) 05/21/2017, 07/25/2017    TDAP status: Up to date Flu Vaccine status: Up to date Pneumococcal vaccine status: Up to date Covid-19 vaccine status: Completed vaccines  Qualifies for Shingles Vaccine? Yes   Zostavax completed Yes   Shingrix Completed?: Yes  Screening Tests Health Maintenance  Topic Date Due  . COLONOSCOPY  05/21/2023  . TETANUS/TDAP  07/04/2023  . INFLUENZA VACCINE  Completed  . COVID-19 Vaccine  Completed  . Hepatitis C Screening  Completed  . PNA vac Low Risk Adult  Completed    Health Maintenance  There are no preventive care reminders to display for this patient.  Colorectal cancer screening: Completed 05/20/2013. Repeat every 10 years  Lung Cancer Screening: (Low Dose CT Chest recommended if Age 68-80 years, 30 pack-year currently smoking OR have quit w/in 15years.) does not qualify.   Lung Cancer Screening Referral: no  Additional Screening:  Hepatitis C Screening: does qualify; Completed yes  Vision Screening: Recommended annual ophthalmology exams for early detection of glaucoma and other disorders of the eye. Is the patient up to date with their annual eye  exam?  Yes  Who is the provider or what is the name of the office in which the patient attends annual eye exams? [facility] If pt is not established with a provider, would they like to be referred to a provider to establish care? No .   Dental Screening: Recommended annual dental exams for proper oral hygiene  Community Resource Referral / Chronic Care Management: CRR required this visit?  No   CCM required this visit?  No      Plan:     I have personally reviewed and noted the following in the patient's chart:   . Medical and social history . Use of alcohol, tobacco or illicit drugs  . Current medications and supplements . Functional ability and status . Nutritional status . Physical activity . Advanced directives . List of other physicians . Hospitalizations, surgeries, and ER visits in previous 12 months . Vitals . Screenings to include cognitive, depression, and falls . Referrals and appointments  In addition, I have reviewed and discussed with patient certain preventive protocols, quality metrics, and best practice recommendations. A written personalized care plan for preventive services as well as general preventive health recommendations were provided to patient.     Sheral Flow, LPN   [id number]   Nurse Notes:  Patient is cogitatively intact. There were no vitals filed for this visit. There is no height or weight on file to calculate BMI. Patient stated that he has no issues with gait or balance; does not use any assistive devices.

## 2020-09-06 ENCOUNTER — Telehealth: Payer: Self-pay | Admitting: Internal Medicine

## 2020-09-06 DIAGNOSIS — Z23 Encounter for immunization: Secondary | ICD-10-CM | POA: Diagnosis not present

## 2020-09-06 NOTE — Telephone Encounter (Signed)
Patient was seen by Dr. Alain Marion today and was told he was cleared to get his booster for his Covid vaccine and calling back to check because patient had covid and was cleared covid negative on 08/18/2020 and they had heard it had to be 90 days after having covid but weren't sure. [phone]

## 2020-09-07 NOTE — Telephone Encounter (Signed)
No need to rush to get a booster. OK to wait. Thx

## 2020-09-08 NOTE — Telephone Encounter (Signed)
Called pt there was no answer LMOM w/MD response../lmb 

## 2020-09-20 DIAGNOSIS — M170 Bilateral primary osteoarthritis of knee: Secondary | ICD-10-CM | POA: Diagnosis not present

## 2020-09-29 ENCOUNTER — Other Ambulatory Visit: Payer: Self-pay | Admitting: Internal Medicine

## 2020-09-29 DIAGNOSIS — G8929 Other chronic pain: Secondary | ICD-10-CM

## 2020-10-06 DIAGNOSIS — M25561 Pain in right knee: Secondary | ICD-10-CM | POA: Diagnosis not present

## 2020-10-06 DIAGNOSIS — M545 Low back pain, unspecified: Secondary | ICD-10-CM | POA: Diagnosis not present

## 2020-10-06 DIAGNOSIS — M25562 Pain in left knee: Secondary | ICD-10-CM | POA: Diagnosis not present

## 2020-10-11 DIAGNOSIS — M25562 Pain in left knee: Secondary | ICD-10-CM | POA: Diagnosis not present

## 2020-10-11 DIAGNOSIS — M545 Low back pain, unspecified: Secondary | ICD-10-CM | POA: Diagnosis not present

## 2020-10-11 DIAGNOSIS — M25561 Pain in right knee: Secondary | ICD-10-CM | POA: Diagnosis not present

## 2020-10-16 DIAGNOSIS — M545 Low back pain, unspecified: Secondary | ICD-10-CM | POA: Diagnosis not present

## 2020-10-16 DIAGNOSIS — M25562 Pain in left knee: Secondary | ICD-10-CM | POA: Diagnosis not present

## 2020-10-16 DIAGNOSIS — M25561 Pain in right knee: Secondary | ICD-10-CM | POA: Diagnosis not present

## 2020-10-19 DIAGNOSIS — M25561 Pain in right knee: Secondary | ICD-10-CM | POA: Diagnosis not present

## 2020-10-19 DIAGNOSIS — M25562 Pain in left knee: Secondary | ICD-10-CM | POA: Diagnosis not present

## 2020-10-19 DIAGNOSIS — M545 Low back pain, unspecified: Secondary | ICD-10-CM | POA: Diagnosis not present

## 2020-10-21 ENCOUNTER — Ambulatory Visit (HOSPITAL_BASED_OUTPATIENT_CLINIC_OR_DEPARTMENT_OTHER)
Admission: RE | Admit: 2020-10-21 | Discharge: 2020-10-21 | Disposition: A | Discharge status: Home / Self Care | Payer: Medicare Other | Source: Ambulatory Visit | Attending: Hematology & Oncology | Admitting: Hematology & Oncology

## 2020-10-21 ENCOUNTER — Other Ambulatory Visit: Payer: Self-pay

## 2020-10-21 DIAGNOSIS — I82509 Chronic embolism and thrombosis of unspecified deep veins of unspecified lower extremity: Secondary | ICD-10-CM | POA: Insufficient documentation

## 2020-10-21 DIAGNOSIS — I82432 Acute embolism and thrombosis of left popliteal vein: Secondary | ICD-10-CM | POA: Diagnosis not present

## 2020-10-23 ENCOUNTER — Encounter: Payer: Self-pay | Admitting: *Deleted

## 2020-10-23 DIAGNOSIS — M25561 Pain in right knee: Secondary | ICD-10-CM | POA: Diagnosis not present

## 2020-10-23 DIAGNOSIS — M25562 Pain in left knee: Secondary | ICD-10-CM | POA: Diagnosis not present

## 2020-10-23 DIAGNOSIS — M545 Low back pain, unspecified: Secondary | ICD-10-CM | POA: Diagnosis not present

## 2020-10-25 ENCOUNTER — Other Ambulatory Visit: Payer: Self-pay

## 2020-10-25 ENCOUNTER — Ambulatory Visit (INDEPENDENT_AMBULATORY_CARE_PROVIDER_SITE_OTHER): Payer: Medicare Other

## 2020-10-25 DIAGNOSIS — M810 Age-related osteoporosis without current pathological fracture: Secondary | ICD-10-CM

## 2020-10-25 DIAGNOSIS — M25561 Pain in right knee: Secondary | ICD-10-CM | POA: Diagnosis not present

## 2020-10-25 DIAGNOSIS — M25562 Pain in left knee: Secondary | ICD-10-CM | POA: Diagnosis not present

## 2020-10-25 DIAGNOSIS — M545 Low back pain, unspecified: Secondary | ICD-10-CM | POA: Diagnosis not present

## 2020-10-25 MED ORDER — DENOSUMAB 60 MG/ML ~~LOC~~ SOSY
60.0000 mg | PREFILLED_SYRINGE | Freq: Once | SUBCUTANEOUS | Status: AC
  Administered 2020-10-25: 60 mg via SUBCUTANEOUS

## 2020-10-25 NOTE — Progress Notes (Addendum)
Prolia 60mg  given subcutaneous per Dr Alain Marion order.  Given in right arm, pt tolerated well.  Medical screening examination/treatment/procedure(s) were performed by non-physician practitioner and as supervising physician I was immediately available for consultation/collaboration.  I agree with above. Lew Dawes, MD

## 2020-10-26 ENCOUNTER — Other Ambulatory Visit (HOSPITAL_BASED_OUTPATIENT_CLINIC_OR_DEPARTMENT_OTHER): Payer: Medicare Other

## 2020-10-26 DIAGNOSIS — M545 Low back pain, unspecified: Secondary | ICD-10-CM | POA: Diagnosis not present

## 2020-10-26 DIAGNOSIS — M25562 Pain in left knee: Secondary | ICD-10-CM | POA: Diagnosis not present

## 2020-10-26 DIAGNOSIS — M25561 Pain in right knee: Secondary | ICD-10-CM | POA: Diagnosis not present

## 2020-10-30 ENCOUNTER — Inpatient Hospital Stay: Payer: Medicare Other | Attending: Hematology & Oncology

## 2020-10-30 ENCOUNTER — Inpatient Hospital Stay (HOSPITAL_BASED_OUTPATIENT_CLINIC_OR_DEPARTMENT_OTHER): Payer: Medicare Other | Admitting: Hematology & Oncology

## 2020-10-30 ENCOUNTER — Telehealth: Payer: Self-pay | Admitting: Hematology & Oncology

## 2020-10-30 ENCOUNTER — Encounter: Payer: Self-pay | Admitting: Hematology & Oncology

## 2020-10-30 ENCOUNTER — Other Ambulatory Visit: Payer: Self-pay

## 2020-10-30 VITALS — BP 126/78 | HR 77 | Temp 98.1°F | Resp 18 | Wt 191.0 lb

## 2020-10-30 DIAGNOSIS — D6862 Lupus anticoagulant syndrome: Secondary | ICD-10-CM | POA: Diagnosis not present

## 2020-10-30 DIAGNOSIS — I471 Supraventricular tachycardia: Secondary | ICD-10-CM | POA: Insufficient documentation

## 2020-10-30 DIAGNOSIS — I2510 Atherosclerotic heart disease of native coronary artery without angina pectoris: Secondary | ICD-10-CM | POA: Diagnosis not present

## 2020-10-30 DIAGNOSIS — Z7901 Long term (current) use of anticoagulants: Secondary | ICD-10-CM | POA: Diagnosis not present

## 2020-10-30 DIAGNOSIS — I82451 Acute embolism and thrombosis of right peroneal vein: Secondary | ICD-10-CM | POA: Insufficient documentation

## 2020-10-30 DIAGNOSIS — I2583 Coronary atherosclerosis due to lipid rich plaque: Secondary | ICD-10-CM | POA: Diagnosis not present

## 2020-10-30 DIAGNOSIS — I82509 Chronic embolism and thrombosis of unspecified deep veins of unspecified lower extremity: Secondary | ICD-10-CM

## 2020-10-30 LAB — CBC WITH DIFFERENTIAL (CANCER CENTER ONLY)
Abs Immature Granulocytes: 0.02 10*3/uL (ref 0.00–0.07)
Basophils Absolute: 0 10*3/uL (ref 0.0–0.1)
Basophils Relative: 0 %
Eosinophils Absolute: 0.3 10*3/uL (ref 0.0–0.5)
Eosinophils Relative: 3 %
HCT: 43.9 % (ref 39.0–52.0)
Hemoglobin: 14.3 g/dL (ref 13.0–17.0)
Immature Granulocytes: 0 %
Lymphocytes Relative: 28 %
Lymphs Abs: 2.5 10*3/uL (ref 0.7–4.0)
MCH: 31.1 pg (ref 26.0–34.0)
MCHC: 32.6 g/dL (ref 30.0–36.0)
MCV: 95.4 fL (ref 80.0–100.0)
Monocytes Absolute: 0.7 10*3/uL (ref 0.1–1.0)
Monocytes Relative: 8 %
Neutro Abs: 5.5 10*3/uL (ref 1.7–7.7)
Neutrophils Relative %: 61 %
Platelet Count: 218 10*3/uL (ref 150–400)
RBC: 4.6 MIL/uL (ref 4.22–5.81)
RDW: 15 % (ref 11.5–15.5)
WBC Count: 9.1 10*3/uL (ref 4.0–10.5)
nRBC: 0 % (ref 0.0–0.2)

## 2020-10-30 LAB — CMP (CANCER CENTER ONLY)
ALT: 19 U/L (ref 0–44)
AST: 18 U/L (ref 15–41)
Albumin: 4 g/dL (ref 3.5–5.0)
Alkaline Phosphatase: 36 U/L — ABNORMAL LOW (ref 38–126)
Anion gap: 6 (ref 5–15)
BUN: 22 mg/dL (ref 8–23)
CO2: 29 mmol/L (ref 22–32)
Calcium: 9 mg/dL (ref 8.9–10.3)
Chloride: 108 mmol/L (ref 98–111)
Creatinine: 1.09 mg/dL (ref 0.61–1.24)
GFR, Estimated: >60 mL/min (ref >60)
Glucose, Bld: 86 mg/dL (ref 70–99)
Potassium: 3.9 mmol/L (ref 3.5–5.1)
Sodium: 143 mmol/L (ref 135–145)
Total Bilirubin: 0.6 mg/dL (ref 0.3–1.2)
Total Protein: 6.8 g/dL (ref 6.5–8.1)

## 2020-10-30 NOTE — Progress Notes (Signed)
Hematology and Oncology Follow Up Visit  Roger Kent [id number] [birth year] 74 y.o. 10/30/2020   Principle Diagnosis:   DVT of the right peroneal vein  SVT of the right saphenous vein  (+) lupus anti-coagulant  Current Therapy:    Xarelto 20 mg po q day -- 1 yr of therapeutic to complete in 05/2019  Xarelto 10 mg po q day -- maintenance x 42yr -- start 05/2019 -- completed on 06/2020  EC ASA 81 mg po q day -- started on 01/11/2019 -- changed to 325 mg po q day on 06/05/2020     Interim History:  Roger Kent is back for follow-up.  We see him every 6 months.  Since we last saw him, he has been doing okay.  He is doing some physical therapy at the [facility].  He is doing some water aerobics.  This is to try to help his knees.  He is trying to avoid knee surgery.  He has knees that are bad on both sides.  He is doing okay on the aspirin.  He is on full dose aspirin.  He takes it with food.  He takes coated aspirin.  He says it does not cause any problems for him with respect to reflux.  He has had no bleeding.  He has had no change in bowel or bladder habits.  He has had no cough or shortness of breath.  Is been no chest wall pain.  He has had no leg swelling.  Overall, his performance status is [name].   Medications:  Current Outpatient Medications:  .  aspirin 325 MG tablet, aspirin 325 mg tablet  Take 1 tablet every day by oral route., Disp: , Rfl:  .  Bromfenac Sodium (PROLENSA) 0.07 % SOLN, Prolensa 0.07 % eye drops, Disp: , Rfl:  .  Chlorpheniramine Maleate (ALLERGY PO), Take by mouth., Disp: , Rfl:  .  cholecalciferol (VITAMIN D) 1000 UNITS tablet, Take 1,000 Units by mouth every morning., Disp: , Rfl:  .  Cyanocobalamin (VITAMIN B 12 PO), Take by mouth., Disp: , Rfl:  .  EPINEPHrine (EPIPEN 2-PAK) 0.3 mg/0.3 mL IJ SOAJ injection, Inject 0.3 mLs (0.3 mg total) into the muscle as needed for anaphylaxis., Disp: 1 Device, Rfl: 3 .  Evolocumab (REPATHA SURECLICK) 834  MG/ML SOAJ, Inject 1 pen into the skin every 14 (fourteen) days., Disp: 2 pen, Rfl: 11 .  Eyelid Cleansers (AVENOVA) 0.01 % SOLN, SMARTSIG:40 Milliliter(s) Topical Twice Daily, Disp: , Rfl:  .  ezetimibe (ZETIA) 10 MG tablet, Take 10 mg by mouth daily., Disp: , Rfl:  .  glucosamine-chondroitin 500-400 MG tablet, Take 2 tablets by mouth every morning. , Disp: , Rfl:  .  Homeopathic Products (STRESS/EXHAUSTION RELIEF SL), Take 2 capsules by mouth at bedtime., Disp: , Rfl:  .  ketorolac (ACULAR) 0.4 % SOLN, Apply 1 drop to eye daily., Disp: , Rfl:  .  Lactobacillus (CVS ACIDOPHILUS PO), Take by mouth daily., Disp: , Rfl:  .  Lifitegrast 5 % SOLN, Apply to eye 2 (two) times daily., Disp: , Rfl:  .  loteprednol (LOTEMAX) 0.5 % ophthalmic suspension, 3 (three) times daily., Disp: , Rfl:  .  Multiple Vitamin (MULTIVITAMIN WITH MINERALS) TABS tablet, Take 1 tablet by mouth every morning., Disp: , Rfl:  .  Nutritional Supplements (QUINOA KALE & HEMP) LIQD, Take by mouth daily., Disp: , Rfl:  .  olmesartan (BENICAR) 40 MG tablet, Take 1 tablet (40 mg total) by mouth daily., Disp: 90  tablet, Rfl: 3 .  omeprazole-sodium bicarbonate (ZEGERID) 40-1100 MG per capsule, Take 1 capsule by mouth daily before breakfast., Disp: , Rfl:  .  OVER THE COUNTER MEDICATION, Take 3 capsules by mouth every morning. OTC herbal supplement for prostate health, Disp: , Rfl:  .  OVER THE COUNTER MEDICATION, Lipobiotol daily, Disp: , Rfl:  .  prednisoLONE acetate (PRED FORTE) 1 % ophthalmic suspension, 1 drop 2 (two) times daily., Disp: , Rfl:  .  ciprofloxacin (CIPRO) 500 MG tablet, Take 1 tablet (500 mg total) by mouth 2 (two) times daily., Disp: 28 tablet, Rfl: 0  Allergies:  Allergies  Allergen Reactions  . Oxycodone-Acetaminophen Other (See Comments)    Other Reaction: agitation  . Crestor [Rosuvastatin]   . Zetia [Ezetimibe]     Brain fog, felt sluggish  . Lamisil [Terbinafine] Rash    Past Medical History, Surgical  history, Social history, and Family History were reviewed and updated.  Review of Systems: Review of Systems  Constitutional: Negative.   HENT:  Negative.   Eyes: Negative.   Respiratory: Negative.   Cardiovascular: Negative.   Gastrointestinal: Negative.   Endocrine: Negative.   Genitourinary: Negative.    Musculoskeletal: Negative.   Skin: Negative.   Neurological: Negative.   Hematological: Negative.   Psychiatric/Behavioral: Negative.     Physical Exam:  weight is 191 lb (86.6 kg). His oral temperature is 98.1 F (36.7 C). His blood pressure is 126/78 and his pulse is 77. His respiration is 18 and oxygen saturation is 97%.   Wt Readings from Last 3 Encounters:  10/30/20 191 lb (86.6 kg)  08/01/20 188 lb (85.3 kg)  06/05/20 192 lb (87.1 kg)    Physical Exam Vitals reviewed.  HENT:     Head: Normocephalic and atraumatic.  Eyes:     Pupils: Pupils are equal, round, and reactive to light.  Cardiovascular:     Rate and Rhythm: Normal rate and regular rhythm.     Heart sounds: Normal heart sounds.  Pulmonary:     Effort: Pulmonary effort is normal.     Breath sounds: Normal breath sounds.  Abdominal:     General: Bowel sounds are normal.     Palpations: Abdomen is soft.  Musculoskeletal:        General: No tenderness or deformity. Normal range of motion.     Cervical back: Normal range of motion.  Lymphadenopathy:     Cervical: No cervical adenopathy.  Skin:    General: Skin is warm and dry.     Findings: No erythema or rash.  Neurological:     Mental Status: He is alert and oriented to person, place, and time.  Psychiatric:        Behavior: Behavior normal.        Thought Content: Thought content normal.        Judgment: Judgment normal.      Lab Results  Component Value Date   WBC 9.1 10/30/2020   HGB 14.3 10/30/2020   HCT 43.9 10/30/2020   MCV 95.4 10/30/2020   PLT 218 10/30/2020     Chemistry      Component Value Date/Time   NA 140 08/01/2020  0924   K 4.2 08/01/2020 0924   CL 107 08/01/2020 0924   CO2 23 08/01/2020 0924   BUN 15 08/01/2020 0924   CREATININE 0.98 08/01/2020 0924      Component Value Date/Time   CALCIUM 9.0 08/01/2020 0924   ALKPHOS 41 (L) 08/15/2020 1005  AST 28 08/15/2020 1005   AST 15 06/05/2020 0802   ALT 17 08/15/2020 1005   ALT 12 06/05/2020 0802   BILITOT 0.7 08/15/2020 1005   BILITOT 0.7 06/05/2020 0802       Impression and Plan: Roger Kent is a 74 year old white male.  He had a thrombus in the left leg about 6 years ago.  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  I suspect he will always have this.  I think the real issue now is whether or not he is going to actually have knee surgery.  Again, he is try to put off the surgery as long as possible.  He thinks that his water aerobics is helping.  If he does need knee surgery, he will need to get back onto anticoagulation.  He will let me know if he decides to have the surgery.  Otherwise, we will get him back in 6 months.    Volanda Napoleon, MD 12/13/20218:13 AM

## 2020-10-30 NOTE — Telephone Encounter (Signed)
Appointments scheduled patinet has My Chart did not want a printout per 12/13 los

## 2020-10-31 DIAGNOSIS — M25561 Pain in right knee: Secondary | ICD-10-CM | POA: Diagnosis not present

## 2020-10-31 DIAGNOSIS — M25562 Pain in left knee: Secondary | ICD-10-CM | POA: Diagnosis not present

## 2020-10-31 DIAGNOSIS — M545 Low back pain, unspecified: Secondary | ICD-10-CM | POA: Diagnosis not present

## 2020-11-02 DIAGNOSIS — M25561 Pain in right knee: Secondary | ICD-10-CM | POA: Diagnosis not present

## 2020-11-02 DIAGNOSIS — M545 Low back pain, unspecified: Secondary | ICD-10-CM | POA: Diagnosis not present

## 2020-11-02 DIAGNOSIS — M25562 Pain in left knee: Secondary | ICD-10-CM | POA: Diagnosis not present

## 2020-11-06 DIAGNOSIS — M545 Low back pain, unspecified: Secondary | ICD-10-CM | POA: Diagnosis not present

## 2020-11-06 DIAGNOSIS — M25562 Pain in left knee: Secondary | ICD-10-CM | POA: Diagnosis not present

## 2020-11-06 DIAGNOSIS — M25561 Pain in right knee: Secondary | ICD-10-CM | POA: Diagnosis not present

## 2020-11-08 DIAGNOSIS — M545 Low back pain, unspecified: Secondary | ICD-10-CM | POA: Diagnosis not present

## 2020-11-08 DIAGNOSIS — M25561 Pain in right knee: Secondary | ICD-10-CM | POA: Diagnosis not present

## 2020-11-08 DIAGNOSIS — M25562 Pain in left knee: Secondary | ICD-10-CM | POA: Diagnosis not present

## 2020-11-21 DIAGNOSIS — M25562 Pain in left knee: Secondary | ICD-10-CM | POA: Diagnosis not present

## 2020-11-21 DIAGNOSIS — M25561 Pain in right knee: Secondary | ICD-10-CM | POA: Diagnosis not present

## 2020-11-21 DIAGNOSIS — M545 Low back pain, unspecified: Secondary | ICD-10-CM | POA: Diagnosis not present

## 2020-11-22 DIAGNOSIS — M25562 Pain in left knee: Secondary | ICD-10-CM | POA: Diagnosis not present

## 2020-11-22 DIAGNOSIS — M545 Low back pain, unspecified: Secondary | ICD-10-CM | POA: Diagnosis not present

## 2020-11-22 DIAGNOSIS — M25561 Pain in right knee: Secondary | ICD-10-CM | POA: Diagnosis not present

## 2020-11-29 DIAGNOSIS — M545 Low back pain, unspecified: Secondary | ICD-10-CM | POA: Diagnosis not present

## 2020-11-29 DIAGNOSIS — M25561 Pain in right knee: Secondary | ICD-10-CM | POA: Diagnosis not present

## 2020-11-29 DIAGNOSIS — M25562 Pain in left knee: Secondary | ICD-10-CM | POA: Diagnosis not present

## 2020-12-05 DIAGNOSIS — M25562 Pain in left knee: Secondary | ICD-10-CM | POA: Diagnosis not present

## 2020-12-05 DIAGNOSIS — M25561 Pain in right knee: Secondary | ICD-10-CM | POA: Diagnosis not present

## 2020-12-05 DIAGNOSIS — M545 Low back pain, unspecified: Secondary | ICD-10-CM | POA: Diagnosis not present

## 2020-12-07 DIAGNOSIS — M25561 Pain in right knee: Secondary | ICD-10-CM | POA: Diagnosis not present

## 2020-12-07 DIAGNOSIS — M25562 Pain in left knee: Secondary | ICD-10-CM | POA: Diagnosis not present

## 2020-12-07 DIAGNOSIS — M545 Low back pain, unspecified: Secondary | ICD-10-CM | POA: Diagnosis not present

## 2020-12-11 DIAGNOSIS — L821 Other seborrheic keratosis: Secondary | ICD-10-CM | POA: Diagnosis not present

## 2020-12-11 DIAGNOSIS — L905 Scar conditions and fibrosis of skin: Secondary | ICD-10-CM | POA: Diagnosis not present

## 2020-12-11 DIAGNOSIS — L814 Other melanin hyperpigmentation: Secondary | ICD-10-CM | POA: Diagnosis not present

## 2020-12-11 DIAGNOSIS — Z85828 Personal history of other malignant neoplasm of skin: Secondary | ICD-10-CM | POA: Diagnosis not present

## 2020-12-12 ENCOUNTER — Other Ambulatory Visit (HOSPITAL_COMMUNITY): Payer: Self-pay | Admitting: Cardiology

## 2020-12-12 ENCOUNTER — Other Ambulatory Visit: Payer: Self-pay

## 2020-12-12 ENCOUNTER — Ambulatory Visit (HOSPITAL_COMMUNITY)
Admission: RE | Admit: 2020-12-12 | Discharge: 2020-12-12 | Disposition: A | Discharge status: Home / Self Care | Payer: Medicare Other | Source: Ambulatory Visit | Attending: Cardiovascular Disease | Admitting: Cardiovascular Disease

## 2020-12-12 DIAGNOSIS — M25562 Pain in left knee: Secondary | ICD-10-CM | POA: Diagnosis not present

## 2020-12-12 DIAGNOSIS — I723 Aneurysm of iliac artery: Secondary | ICD-10-CM

## 2020-12-12 DIAGNOSIS — M545 Low back pain, unspecified: Secondary | ICD-10-CM | POA: Diagnosis not present

## 2020-12-12 DIAGNOSIS — I77819 Aortic ectasia, unspecified site: Secondary | ICD-10-CM

## 2020-12-12 DIAGNOSIS — M25561 Pain in right knee: Secondary | ICD-10-CM | POA: Diagnosis not present

## 2020-12-14 DIAGNOSIS — M25561 Pain in right knee: Secondary | ICD-10-CM | POA: Diagnosis not present

## 2020-12-14 DIAGNOSIS — M545 Low back pain, unspecified: Secondary | ICD-10-CM | POA: Diagnosis not present

## 2020-12-14 DIAGNOSIS — M25562 Pain in left knee: Secondary | ICD-10-CM | POA: Diagnosis not present

## 2020-12-18 DIAGNOSIS — M25562 Pain in left knee: Secondary | ICD-10-CM | POA: Diagnosis not present

## 2020-12-18 DIAGNOSIS — M545 Low back pain, unspecified: Secondary | ICD-10-CM | POA: Diagnosis not present

## 2020-12-18 DIAGNOSIS — M25561 Pain in right knee: Secondary | ICD-10-CM | POA: Diagnosis not present

## 2020-12-20 DIAGNOSIS — M545 Low back pain, unspecified: Secondary | ICD-10-CM | POA: Diagnosis not present

## 2020-12-20 DIAGNOSIS — M25562 Pain in left knee: Secondary | ICD-10-CM | POA: Diagnosis not present

## 2020-12-20 DIAGNOSIS — M25561 Pain in right knee: Secondary | ICD-10-CM | POA: Diagnosis not present

## 2020-12-25 DIAGNOSIS — M25562 Pain in left knee: Secondary | ICD-10-CM | POA: Diagnosis not present

## 2020-12-25 DIAGNOSIS — M25561 Pain in right knee: Secondary | ICD-10-CM | POA: Diagnosis not present

## 2020-12-25 DIAGNOSIS — M545 Low back pain, unspecified: Secondary | ICD-10-CM | POA: Diagnosis not present

## 2020-12-25 NOTE — Progress Notes (Signed)
HPI: FU congenital VSD, and prior DVT. On xarelto for DVT. Calcium score June 2020-277 and mildly dilated ascending aorta at 4.3 cm.  Nuclear study June 2020 showed ejection fraction 51%, small fixed apical defect felt to be artifact and no ischemia. Echocardiogram February 2021 showed normal LV function, small restrictive ventricular septal defect with left-to-right shunting, mild aortic insufficiency, mildly dilated ascending aorta at 41 mm.  CTA June 2021 showed 42 mm ascending thoracic aortic aneurysm.  Venous Dopplers December 2021 showed chronic partially occlusive thrombus within the left superficial femoral and popliteal veins.  Abdominal ultrasound January 2022 showed no abdominal aortic aneurysm.  There was abnormal dilatation of the right common iliac artery, left common iliac artery, right external iliac artery and left external iliac artery.  Follow-up recommended 24 months.  Since I last saw himpatient denies dyspnea on exertion, chest pain, palpitations or syncope.  Current Outpatient Medications  Medication Sig Dispense Refill  . aspirin 325 MG tablet aspirin 325 mg tablet  Take 1 tablet every day by oral route.    . Bromfenac Sodium (PROLENSA) 0.07 % SOLN Prolensa 0.07 % eye drops    . Chlorpheniramine Maleate (ALLERGY PO) Take 1 tablet by mouth as needed.    . cholecalciferol (VITAMIN D) 1000 UNITS tablet Take 1,000 Units by mouth every morning.    . Cyanocobalamin (VITAMIN B 12 PO) Take by mouth.    . EPINEPHrine (EPIPEN 2-PAK) 0.3 mg/0.3 mL IJ SOAJ injection Inject 0.3 mLs (0.3 mg total) into the muscle as needed for anaphylaxis. 1 Device 3  . Evolocumab (REPATHA SURECLICK) 825 MG/ML SOAJ Inject 1 pen into the skin every 14 (fourteen) days. 2 pen 11  . Eyelid Cleansers (AVENOVA) 0.01 % SOLN in the morning.    . ezetimibe (ZETIA) 10 MG tablet Take 10 mg by mouth daily.    Marland Kitchen glucosamine-chondroitin 500-400 MG tablet Take 2 tablets by mouth every morning.     . Homeopathic  Products (STRESS/EXHAUSTION RELIEF SL) Take 2 capsules by mouth at bedtime.    Marland Kitchen ketorolac (ACULAR) 0.4 % SOLN Apply 1 drop to eye daily.    . Lactobacillus (CVS ACIDOPHILUS PO) Take by mouth daily.    . Lifitegrast 5 % SOLN Apply to eye 2 (two) times daily.    Marland Kitchen loteprednol (LOTEMAX) 0.5 % ophthalmic suspension 3 (three) times daily.    . Multiple Vitamin (MULTIVITAMIN WITH MINERALS) TABS tablet Take 1 tablet by mouth every morning.    . olmesartan (BENICAR) 40 MG tablet Take 1 tablet (40 mg total) by mouth daily. 90 tablet 3  . omeprazole-sodium bicarbonate (ZEGERID) 40-1100 MG per capsule Take 1 capsule by mouth daily before breakfast.    . OVER THE COUNTER MEDICATION Take 3 capsules by mouth every morning. OTC herbal supplement for prostate health    . OVER THE COUNTER MEDICATION Lipobiotol daily    . prednisoLONE acetate (PRED FORTE) 1 % ophthalmic suspension 1 drop 2 (two) times daily.     No current facility-administered medications for this visit.     Past Medical History:  Diagnosis Date  . Acute hepatitis C without mention of hepatic coma(070.51)   . Allergic rhinitis, cause unspecified   . Anxiety state, unspecified   . Barrett's esophagus   . BPH (benign prostatic hyperplasia)   . DVT (deep venous thrombosis) ([place])   . Elevated PSA    Dr Karsten Ro  . Esophageal reflux   . Hiatal hernia   . Hyperlipidemia   .  Hypertension   . Hypertonicity of bladder   . Insomnia   . Leg DVT (deep venous thromboembolism), chronic, unspecified laterality ([place]) 09/21/2018  . Osteoporosis, unspecified   . Personal history of urinary calculi   . Recent retinal detachment, partial, with giant tear   . Unspecified tinnitus   . Ventricular septal defect     Past Surgical History:  Procedure Laterality Date  . KNEE SURGERY    . RETINAL DETACHMENT SURGERY      Social History   Socioeconomic History  . Marital status: Married    Spouse name: Sharron Simpson  . Number of children: Not  on file  . Years of education: Not on file  . Highest education level: Not on file  Occupational History  . 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 Occupation: bee keepin  Tobacco Use  . Smoking status: Former Research scientist (life sciences)  . Smokeless tobacco: Never Used  Vaping Use  . Vaping Use: Never used  Substance and Sexual Activity  . Alcohol use: No  . Drug use: No  . Sexual activity: Yes  Other Topics Concern  . Not on file  Social History Narrative               Social Determinants of Health   Financial Resource Strain: Low Risk   . Difficulty of Paying Living Expenses: Not hard at all  Food Insecurity: No Food Insecurity  . Worried About Charity fundraiser in the Last Year: Never true  . Ran Out of Food in the Last Year: Never true  Transportation Needs: No Transportation Needs  . Lack of Transportation (Medical): No  . Lack of Transportation (Non-Medical): No  Physical Activity: Sufficiently Active  . Days of Exercise per Week: 5 days  . Minutes of Exercise per Session: 30 min  Stress: No Stress Concern Present  . Feeling of Stress : Not at all  Social Connections: Not on file  Intimate Partner Violence: Not on file    Family History  Problem Relation Age of Onset  . Hypertension Other   . Alcohol abuse Neg Hx     ROS: Knee arthralgias but no fevers or chills, productive cough, hemoptysis, dysphasia, odynophagia, melena, hematochezia, dysuria, hematuria, rash, seizure activity, orthopnea, PND, pedal edema, claudication. Remaining systems are negative.  Physical Exam: Well-developed well-nourished in no acute distress.  Skin is warm and dry.  HEENT is normal.  Neck is supple.  Chest is clear to auscultation with normal expansion.  Cardiovascular exam is regular rate and rhythm.  3/6 systolic murmur left sternal border. Abdominal exam nontender or distended. No masses palpated. Extremities show no edema. neuro grossly intact  ECG-sinus  rhythm with frequent PVCs.  Personally reviewed  A/P  1 ventricular septal defect-small most recent echocardiogram and right side not enlarged.  We will follow for now.  2 thoracic aortic aneurysm-Pan follow-up CTA June 2022.  3 coronary artery disease-this is based on previous calcium score that was elevated.  Follow-up nuclear study showed no ischemia.  Continue medical therapy.  4 history of iliac aneurysms-patient will need follow-up ultrasound January 2024.  5 hypertension-patient's blood pressure is controlled.  Continue present medical regimen.  6 hyperlipidemia-continue Zetia and Repatha.  Intolerant to statins.  7 aortic insufficiency-mild on most recent echocardiogram.  Kirk Ruths, MD

## 2021-01-01 DIAGNOSIS — M25562 Pain in left knee: Secondary | ICD-10-CM | POA: Diagnosis not present

## 2021-01-01 DIAGNOSIS — M545 Low back pain, unspecified: Secondary | ICD-10-CM | POA: Diagnosis not present

## 2021-01-01 DIAGNOSIS — M25561 Pain in right knee: Secondary | ICD-10-CM | POA: Diagnosis not present

## 2021-01-02 ENCOUNTER — Other Ambulatory Visit: Payer: Self-pay

## 2021-01-02 ENCOUNTER — Ambulatory Visit (INDEPENDENT_AMBULATORY_CARE_PROVIDER_SITE_OTHER): Payer: Medicare Other | Admitting: Cardiology

## 2021-01-02 ENCOUNTER — Encounter: Payer: Self-pay | Admitting: Cardiology

## 2021-01-02 VITALS — BP 130/70 | HR 73 | Ht 73.0 in | Wt 188.8 lb

## 2021-01-02 DIAGNOSIS — I2510 Atherosclerotic heart disease of native coronary artery without angina pectoris: Secondary | ICD-10-CM | POA: Diagnosis not present

## 2021-01-02 DIAGNOSIS — I712 Thoracic aortic aneurysm, without rupture, unspecified: Secondary | ICD-10-CM

## 2021-01-02 DIAGNOSIS — I10 Essential (primary) hypertension: Secondary | ICD-10-CM

## 2021-01-02 DIAGNOSIS — R931 Abnormal findings on diagnostic imaging of heart and coronary circulation: Secondary | ICD-10-CM | POA: Diagnosis not present

## 2021-01-02 DIAGNOSIS — I723 Aneurysm of iliac artery: Secondary | ICD-10-CM | POA: Diagnosis not present

## 2021-01-02 DIAGNOSIS — I2583 Coronary atherosclerosis due to lipid rich plaque: Secondary | ICD-10-CM | POA: Diagnosis not present

## 2021-01-02 DIAGNOSIS — E7800 Pure hypercholesterolemia, unspecified: Secondary | ICD-10-CM

## 2021-01-02 NOTE — Patient Instructions (Signed)

## 2021-01-10 DIAGNOSIS — M545 Low back pain, unspecified: Secondary | ICD-10-CM | POA: Diagnosis not present

## 2021-01-10 DIAGNOSIS — M25562 Pain in left knee: Secondary | ICD-10-CM | POA: Diagnosis not present

## 2021-01-10 DIAGNOSIS — M25561 Pain in right knee: Secondary | ICD-10-CM | POA: Diagnosis not present

## 2021-01-15 DIAGNOSIS — M25561 Pain in right knee: Secondary | ICD-10-CM | POA: Diagnosis not present

## 2021-01-15 DIAGNOSIS — M545 Low back pain, unspecified: Secondary | ICD-10-CM | POA: Diagnosis not present

## 2021-01-15 DIAGNOSIS — M25562 Pain in left knee: Secondary | ICD-10-CM | POA: Diagnosis not present

## 2021-01-17 DIAGNOSIS — M25562 Pain in left knee: Secondary | ICD-10-CM | POA: Diagnosis not present

## 2021-01-17 DIAGNOSIS — M170 Bilateral primary osteoarthritis of knee: Secondary | ICD-10-CM | POA: Diagnosis not present

## 2021-01-17 DIAGNOSIS — M25561 Pain in right knee: Secondary | ICD-10-CM | POA: Diagnosis not present

## 2021-01-17 DIAGNOSIS — M545 Low back pain, unspecified: Secondary | ICD-10-CM | POA: Diagnosis not present

## 2021-01-22 DIAGNOSIS — M25562 Pain in left knee: Secondary | ICD-10-CM | POA: Diagnosis not present

## 2021-01-22 DIAGNOSIS — M25561 Pain in right knee: Secondary | ICD-10-CM | POA: Diagnosis not present

## 2021-01-22 DIAGNOSIS — M545 Low back pain, unspecified: Secondary | ICD-10-CM | POA: Diagnosis not present

## 2021-01-24 DIAGNOSIS — M25562 Pain in left knee: Secondary | ICD-10-CM | POA: Diagnosis not present

## 2021-01-24 DIAGNOSIS — M170 Bilateral primary osteoarthritis of knee: Secondary | ICD-10-CM | POA: Diagnosis not present

## 2021-01-24 DIAGNOSIS — M25561 Pain in right knee: Secondary | ICD-10-CM | POA: Diagnosis not present

## 2021-01-24 DIAGNOSIS — M545 Low back pain, unspecified: Secondary | ICD-10-CM | POA: Diagnosis not present

## 2021-01-29 DIAGNOSIS — M25562 Pain in left knee: Secondary | ICD-10-CM | POA: Diagnosis not present

## 2021-01-29 DIAGNOSIS — M545 Low back pain, unspecified: Secondary | ICD-10-CM | POA: Diagnosis not present

## 2021-01-29 DIAGNOSIS — M25561 Pain in right knee: Secondary | ICD-10-CM | POA: Diagnosis not present

## 2021-01-31 DIAGNOSIS — M170 Bilateral primary osteoarthritis of knee: Secondary | ICD-10-CM | POA: Diagnosis not present

## 2021-01-31 DIAGNOSIS — M25562 Pain in left knee: Secondary | ICD-10-CM | POA: Diagnosis not present

## 2021-01-31 DIAGNOSIS — M545 Low back pain, unspecified: Secondary | ICD-10-CM | POA: Diagnosis not present

## 2021-01-31 DIAGNOSIS — M25561 Pain in right knee: Secondary | ICD-10-CM | POA: Diagnosis not present

## 2021-02-06 DIAGNOSIS — H40013 Open angle with borderline findings, low risk, bilateral: Secondary | ICD-10-CM | POA: Diagnosis not present

## 2021-02-06 DIAGNOSIS — H5022 Vertical strabismus, left eye: Secondary | ICD-10-CM | POA: Diagnosis not present

## 2021-02-06 LAB — HM DIABETES EYE EXAM

## 2021-02-08 ENCOUNTER — Encounter: Payer: Self-pay | Admitting: Internal Medicine

## 2021-02-12 ENCOUNTER — Encounter (INDEPENDENT_AMBULATORY_CARE_PROVIDER_SITE_OTHER): Payer: Medicare Other | Admitting: Ophthalmology

## 2021-02-12 ENCOUNTER — Other Ambulatory Visit: Payer: Self-pay

## 2021-02-12 DIAGNOSIS — H43813 Vitreous degeneration, bilateral: Secondary | ICD-10-CM | POA: Diagnosis not present

## 2021-02-12 DIAGNOSIS — H59033 Cystoid macular edema following cataract surgery, bilateral: Secondary | ICD-10-CM

## 2021-02-12 DIAGNOSIS — H33301 Unspecified retinal break, right eye: Secondary | ICD-10-CM | POA: Diagnosis not present

## 2021-02-12 DIAGNOSIS — H35033 Hypertensive retinopathy, bilateral: Secondary | ICD-10-CM

## 2021-02-12 DIAGNOSIS — I10 Essential (primary) hypertension: Secondary | ICD-10-CM | POA: Diagnosis not present

## 2021-02-12 DIAGNOSIS — H338 Other retinal detachments: Secondary | ICD-10-CM | POA: Diagnosis not present

## 2021-03-30 ENCOUNTER — Encounter: Payer: Self-pay | Admitting: *Deleted

## 2021-03-30 NOTE — Progress Notes (Signed)
This encounter was created in error - please disregard.

## 2021-04-20 ENCOUNTER — Other Ambulatory Visit: Payer: Self-pay | Admitting: *Deleted

## 2021-04-20 NOTE — Progress Notes (Signed)
cta

## 2021-04-24 ENCOUNTER — Other Ambulatory Visit: Payer: Self-pay | Admitting: *Deleted

## 2021-04-24 DIAGNOSIS — I712 Thoracic aortic aneurysm, without rupture, unspecified: Secondary | ICD-10-CM

## 2021-04-24 NOTE — Progress Notes (Signed)
bmp 

## 2021-04-24 NOTE — Progress Notes (Signed)
cta

## 2021-04-25 ENCOUNTER — Telehealth: Payer: Self-pay | Admitting: Cardiology

## 2021-04-25 NOTE — Telephone Encounter (Signed)
Left message for patient to call and discuss scheduling the CTA chest/aorta ordered by Dr. Stanford Breed

## 2021-04-26 NOTE — Telephone Encounter (Signed)
Spoke with patient regarding the Monday 05/14/21 8:30 am CTA chest/aorta scheduled at [facility]--arrival time is 8:15 am for check in---liquids only 4 hours prior---lab work to be completed the week of 05/07/21.  Will mail information to patient and it is also available in My Chart.  Patient voiced his understanding.

## 2021-04-30 ENCOUNTER — Telehealth: Payer: Self-pay

## 2021-04-30 ENCOUNTER — Inpatient Hospital Stay: Payer: Medicare Other | Attending: Hematology & Oncology

## 2021-04-30 ENCOUNTER — Other Ambulatory Visit: Payer: Self-pay

## 2021-04-30 ENCOUNTER — Inpatient Hospital Stay (HOSPITAL_BASED_OUTPATIENT_CLINIC_OR_DEPARTMENT_OTHER): Payer: Medicare Other | Admitting: Hematology & Oncology

## 2021-04-30 ENCOUNTER — Encounter: Payer: Self-pay | Admitting: Hematology & Oncology

## 2021-04-30 VITALS — BP 143/72 | HR 77 | Temp 97.9°F | Resp 19 | Wt 190.0 lb

## 2021-04-30 DIAGNOSIS — I82509 Chronic embolism and thrombosis of unspecified deep veins of unspecified lower extremity: Secondary | ICD-10-CM

## 2021-04-30 DIAGNOSIS — D6862 Lupus anticoagulant syndrome: Secondary | ICD-10-CM | POA: Insufficient documentation

## 2021-04-30 DIAGNOSIS — Z86718 Personal history of other venous thrombosis and embolism: Secondary | ICD-10-CM | POA: Insufficient documentation

## 2021-04-30 DIAGNOSIS — I471 Supraventricular tachycardia: Secondary | ICD-10-CM | POA: Insufficient documentation

## 2021-04-30 LAB — CMP (CANCER CENTER ONLY)
ALT: 15 U/L (ref 0–44)
AST: 18 U/L (ref 15–41)
Albumin: 4.2 g/dL (ref 3.5–5.0)
Alkaline Phosphatase: 44 U/L (ref 38–126)
Anion gap: 8 (ref 5–15)
BUN: 19 mg/dL (ref 8–23)
CO2: 28 mmol/L (ref 22–32)
Calcium: 9.8 mg/dL (ref 8.9–10.3)
Chloride: 103 mmol/L (ref 98–111)
Creatinine: 1.21 mg/dL (ref 0.61–1.24)
GFR, Estimated: >60 mL/min (ref >60)
Glucose, Bld: 77 mg/dL (ref 70–99)
Potassium: 4.5 mmol/L (ref 3.5–5.1)
Sodium: 139 mmol/L (ref 135–145)
Total Bilirubin: 0.7 mg/dL (ref 0.3–1.2)
Total Protein: 6.9 g/dL (ref 6.5–8.1)

## 2021-04-30 LAB — CBC WITH DIFFERENTIAL (CANCER CENTER ONLY)
Abs Immature Granulocytes: 0.03 10*3/uL (ref 0.00–0.07)
Basophils Absolute: 0 10*3/uL (ref 0.0–0.1)
Basophils Relative: 1 %
Eosinophils Absolute: 0.4 10*3/uL (ref 0.0–0.5)
Eosinophils Relative: 4 %
HCT: 45.9 % (ref 39.0–52.0)
Hemoglobin: 15.1 g/dL (ref 13.0–17.0)
Immature Granulocytes: 0 %
Lymphocytes Relative: 31 %
Lymphs Abs: 2.6 10*3/uL (ref 0.7–4.0)
MCH: 30.6 pg (ref 26.0–34.0)
MCHC: 32.9 g/dL (ref 30.0–36.0)
MCV: 93.1 fL (ref 80.0–100.0)
Monocytes Absolute: 0.8 10*3/uL (ref 0.1–1.0)
Monocytes Relative: 10 %
Neutro Abs: 4.6 10*3/uL (ref 1.7–7.7)
Neutrophils Relative %: 54 %
Platelet Count: 210 10*3/uL (ref 150–400)
RBC: 4.93 MIL/uL (ref 4.22–5.81)
RDW: 14.2 % (ref 11.5–15.5)
WBC Count: 8.3 10*3/uL (ref 4.0–10.5)
nRBC: 0 % (ref 0.0–0.2)

## 2021-04-30 LAB — LACTATE DEHYDROGENASE: LDH: 168 U/L (ref 98–192)

## 2021-04-30 NOTE — Progress Notes (Signed)
Hematology and Oncology Follow Up Visit  Steen Bisig [id number] 1946/10/04 75 y.o. 04/30/2021   Principle Diagnosis:  DVT of the right peroneal vein SVT of the right saphenous vein (+) lupus anti-coagulant  Current Therapy:   Xarelto 20 mg po q day -- 1 yr of therapeutic to complete in 05/2019 Xarelto 10 mg po q day -- maintenance x 37yr -- start 05/2019 -- completed on 06/2020 EC ASA 81 mg po q day -- started on 01/11/2019 -- changed to 325 mg po q day on 06/05/2020     Interim History:  Mr. Hanak is back for follow-up.  So far, everything is going pretty well for him.  He is doing a lot of therapy to help his knees.  He thinks his knees are actually getting better.  1 thing that is doing that he goes to a stretching class.  He says really does help.  He is quite busy this time a year.  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  He has had no problems with cough or shortness of breath.  There is been no bleeding with the aspirin.  He has had no problems with bowels or bladder.  He has had no cough.  He has had no shortness of breath.  Is been no chest wall pain.  Overall, his performance status is [name].     Medications:  Current Outpatient Medications:    aspirin 325 MG tablet, aspirin 325 mg tablet  Take 1 tablet every day by oral route., Disp: , Rfl:    cholecalciferol (VITAMIN D) 1000 UNITS tablet, Take 1,000 Units by mouth every morning., Disp: , Rfl:    Cyanocobalamin (VITAMIN B 12 PO), Take by mouth., Disp: , Rfl:    EPINEPHrine (EPIPEN 2-PAK) 0.3 mg/0.3 mL IJ SOAJ injection, Inject 0.3 mLs (0.3 mg total) into the muscle as needed for anaphylaxis., Disp: 1 Device, Rfl: 3   Evolocumab (REPATHA SURECLICK) 259 MG/ML SOAJ, Inject 1 pen into the skin every 14 (fourteen) days., Disp: 2 pen, Rfl: 11   ezetimibe (ZETIA) 10 MG tablet, Take 10 mg by mouth daily., Disp: , Rfl:    glucosamine-chondroitin 500-400 MG tablet, Take 2 tablets by mouth  every morning. , Disp: , Rfl:    Lactobacillus (CVS ACIDOPHILUS PO), Take by mouth daily., Disp: , Rfl:    Lifitegrast 5 % SOLN, Apply to eye 2 (two) times daily., Disp: , Rfl:    loteprednol (LOTEMAX) 0.5 % ophthalmic suspension, 3 (three) times daily., Disp: , Rfl:    Multiple Vitamin (MULTIVITAMIN WITH MINERALS) TABS tablet, Take 1 tablet by mouth every morning., Disp: , Rfl:    olmesartan (BENICAR) 40 MG tablet, Take 1 tablet (40 mg total) by mouth daily., Disp: 90 tablet, Rfl: 3   omeprazole-sodium bicarbonate (ZEGERID) 40-1100 MG per capsule, Take 1 capsule by mouth daily before breakfast., Disp: , Rfl:    OVER THE COUNTER MEDICATION, Take 3 capsules by mouth every morning. OTC herbal supplement for prostate health, Disp: , Rfl:    prednisoLONE acetate (PRED FORTE) 1 % ophthalmic suspension, 1 drop 2 (two) times daily., Disp: , Rfl:   Allergies:  Allergies  Allergen Reactions   Oxycodone-Acetaminophen Other (See Comments)    Other Reaction: agitation   Crestor [Rosuvastatin]    Zetia [Ezetimibe]     Brain fog, felt sluggish   Lamisil [Terbinafine] Rash    Past Medical History, Surgical history, Social history, and Family History  were reviewed and updated.  Review of Systems: Review of Systems  Constitutional: Negative.   HENT:  Negative.    Eyes: Negative.   Respiratory: Negative.    Cardiovascular: Negative.   Gastrointestinal: Negative.   Endocrine: Negative.   Genitourinary: Negative.    Musculoskeletal: Negative.   Skin: Negative.   Neurological: Negative.   Hematological: Negative.   Psychiatric/Behavioral: Negative.     Physical Exam:  vitals were not taken for this visit.   Wt Readings from Last 3 Encounters:  01/02/21 188 lb 12.8 oz (85.6 kg)  10/30/20 191 lb (86.6 kg)  08/01/20 188 lb (85.3 kg)    Physical Exam Vitals reviewed.  HENT:     Head: Normocephalic and atraumatic.  Eyes:     Pupils: Pupils are equal, round, and reactive to light.   Cardiovascular:     Rate and Rhythm: Normal rate and regular rhythm.     Heart sounds: Normal heart sounds.  Pulmonary:     Effort: Pulmonary effort is normal.     Breath sounds: Normal breath sounds.  Abdominal:     General: Bowel sounds are normal.     Palpations: Abdomen is soft.  Musculoskeletal:        General: No tenderness or deformity. Normal range of motion.     Cervical back: Normal range of motion.  Lymphadenopathy:     Cervical: No cervical adenopathy.  Skin:    General: Skin is warm and dry.     Findings: No erythema or rash.  Neurological:     Mental Status: He is alert and oriented to person, place, and time.  Psychiatric:        Behavior: Behavior normal.        Thought Content: Thought content normal.        Judgment: Judgment normal.     Lab Results  Component Value Date   WBC 8.3 04/30/2021   HGB 15.1 04/30/2021   HCT 45.9 04/30/2021   MCV 93.1 04/30/2021   PLT 210 04/30/2021     Chemistry      Component Value Date/Time   NA 143 10/30/2020 0747   K 3.9 10/30/2020 0747   CL 108 10/30/2020 0747   CO2 29 10/30/2020 0747   BUN 22 10/30/2020 0747   CREATININE 1.09 10/30/2020 0747   CREATININE 0.98 08/01/2020 0924      Component Value Date/Time   CALCIUM 9.0 10/30/2020 0747   ALKPHOS 36 (L) 10/30/2020 0747   AST 18 10/30/2020 0747   ALT 19 10/30/2020 0747   BILITOT 0.6 10/30/2020 0747       Impression and Plan: Mr. Ginyard is a 75 year old white male.  He had a thrombus in the left leg about 6 years ago.  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  I suspect he will always have this.  I do not see any indication that we have to do another Doppler.  Hopefully, he will not need knee surgery.  If he does need knee surgery, we will certainly arrange for formal anticoagulation.  We will plan for another 6-month follow-up.   Volanda Napoleon, MD 6/13/20228:10 AM

## 2021-04-30 NOTE — Telephone Encounter (Signed)
Appts made and printed for pt per los 04/30/21  Roger Kent

## 2021-05-10 DIAGNOSIS — I712 Thoracic aortic aneurysm, without rupture: Secondary | ICD-10-CM | POA: Diagnosis not present

## 2021-05-10 LAB — BASIC METABOLIC PANEL
BUN/Creatinine Ratio: 14 (ref 10–24)
BUN: 15 mg/dL (ref 8–27)
CO2: 21 mmol/L (ref 20–29)
Calcium: 9.2 mg/dL (ref 8.6–10.2)
Chloride: 103 mmol/L (ref 96–106)
Creatinine, Ser: 1.07 mg/dL (ref 0.76–1.27)
Glucose: 78 mg/dL (ref 65–99)
Potassium: 4.4 mmol/L (ref 3.5–5.2)
Sodium: 140 mmol/L (ref 134–144)
eGFR: 72 mL/min/{1.73_m2} (ref >59)

## 2021-05-14 ENCOUNTER — Ambulatory Visit
Admission: RE | Admit: 2021-05-14 | Discharge: 2021-05-14 | Disposition: A | Discharge status: Home / Self Care | Payer: Medicare Other | Source: Ambulatory Visit | Attending: Cardiology | Admitting: Cardiology

## 2021-05-14 ENCOUNTER — Other Ambulatory Visit: Payer: Self-pay

## 2021-05-14 DIAGNOSIS — I712 Thoracic aortic aneurysm, without rupture, unspecified: Secondary | ICD-10-CM

## 2021-05-14 DIAGNOSIS — I2510 Atherosclerotic heart disease of native coronary artery without angina pectoris: Secondary | ICD-10-CM | POA: Diagnosis not present

## 2021-05-14 DIAGNOSIS — I898 Other specified noninfective disorders of lymphatic vessels and lymph nodes: Secondary | ICD-10-CM | POA: Diagnosis not present

## 2021-05-14 DIAGNOSIS — J8410 Pulmonary fibrosis, unspecified: Secondary | ICD-10-CM | POA: Diagnosis not present

## 2021-05-14 MED ORDER — IOPAMIDOL (ISOVUE-370) INJECTION 76%
75.0000 mL | Freq: Once | INTRAVENOUS | Status: AC | PRN
  Administered 2021-05-14: 09:00:00 75 mL via INTRAVENOUS

## 2021-06-01 DIAGNOSIS — Z20822 Contact with and (suspected) exposure to covid-19: Secondary | ICD-10-CM | POA: Diagnosis not present

## 2021-06-11 ENCOUNTER — Telehealth: Payer: Self-pay | Admitting: Cardiology

## 2021-06-11 ENCOUNTER — Other Ambulatory Visit: Payer: Self-pay | Admitting: Cardiology

## 2021-06-11 DIAGNOSIS — I2510 Atherosclerotic heart disease of native coronary artery without angina pectoris: Secondary | ICD-10-CM

## 2021-06-11 DIAGNOSIS — E7800 Pure hypercholesterolemia, unspecified: Secondary | ICD-10-CM

## 2021-06-11 DIAGNOSIS — L905 Scar conditions and fibrosis of skin: Secondary | ICD-10-CM | POA: Diagnosis not present

## 2021-06-11 DIAGNOSIS — Z85828 Personal history of other malignant neoplasm of skin: Secondary | ICD-10-CM | POA: Diagnosis not present

## 2021-06-11 NOTE — Telephone Encounter (Signed)
*  STAT* If patient is at the pharmacy, call can be transferred to refill team.   1. Which medications need to be refilled? (please list name of each medication and dose if known) Evolocumab (REPATHA SURECLICK) [id number] MG/ML SOAJ  2. Which pharmacy/location (including street and city if local pharmacy) is medication to be sent to? [facility] # [place], [place]  3. Do they need a 30 day or 90 day supply? 30 day   Patient states he is completely out of medication.

## 2021-06-19 ENCOUNTER — Other Ambulatory Visit: Payer: Self-pay | Admitting: Internal Medicine

## 2021-07-08 DIAGNOSIS — Z23 Encounter for immunization: Secondary | ICD-10-CM | POA: Diagnosis not present

## 2021-07-25 ENCOUNTER — Ambulatory Visit (INDEPENDENT_AMBULATORY_CARE_PROVIDER_SITE_OTHER): Payer: Medicare Other

## 2021-07-25 ENCOUNTER — Other Ambulatory Visit: Payer: Self-pay

## 2021-07-25 DIAGNOSIS — Z23 Encounter for immunization: Secondary | ICD-10-CM

## 2021-08-08 DIAGNOSIS — M170 Bilateral primary osteoarthritis of knee: Secondary | ICD-10-CM | POA: Diagnosis not present

## 2021-08-14 ENCOUNTER — Telehealth: Payer: Self-pay

## 2021-08-14 NOTE — Telephone Encounter (Signed)
Prolia VOB initiated via [email or web address]  Last OV:  Next OV:  Last Prolia inj: 10/25/20 Next Prolia inj DUE: 04/26/21

## 2021-08-15 DIAGNOSIS — M170 Bilateral primary osteoarthritis of knee: Secondary | ICD-10-CM | POA: Diagnosis not present

## 2021-08-22 DIAGNOSIS — M170 Bilateral primary osteoarthritis of knee: Secondary | ICD-10-CM | POA: Diagnosis not present

## 2021-08-22 NOTE — Telephone Encounter (Addendum)
Pt ready for scheduling on or after 04/26/21 (Pt also due for OV)  Out-of-pocket cost due at time of visit: $0.00   Primary: Medicare Prolia co-insurance: 20% (approximately $255) Admin fee co-insurance: 20% (approximately $25)  Secondary: BCBS Prolia co-insurance: covers Medicare Part B co-insurance and deductible Admin fee co-insurance: covers Medicare Part B co-insurance and deductible  Deductible: $233 of $233 met  Prior Auth: not required PA# Valid:   ** This summary of benefits is an estimation of the patient's out-of-pocket cost. Exact cost may vary based on individual plan coverage.

## 2021-10-22 NOTE — Telephone Encounter (Signed)
Left patient a message to call to schedule Prolia injection.

## 2021-10-24 DIAGNOSIS — H40013 Open angle with borderline findings, low risk, bilateral: Secondary | ICD-10-CM | POA: Diagnosis not present

## 2021-10-24 DIAGNOSIS — H04123 Dry eye syndrome of bilateral lacrimal glands: Secondary | ICD-10-CM | POA: Diagnosis not present

## 2021-10-25 ENCOUNTER — Other Ambulatory Visit: Payer: Self-pay

## 2021-10-25 ENCOUNTER — Telehealth: Payer: Self-pay | Admitting: Internal Medicine

## 2021-10-25 ENCOUNTER — Ambulatory Visit (INDEPENDENT_AMBULATORY_CARE_PROVIDER_SITE_OTHER): Payer: Medicare Other

## 2021-10-25 DIAGNOSIS — M810 Age-related osteoporosis without current pathological fracture: Secondary | ICD-10-CM

## 2021-10-25 DIAGNOSIS — Z20822 Contact with and (suspected) exposure to covid-19: Secondary | ICD-10-CM | POA: Diagnosis not present

## 2021-10-25 MED ORDER — DENOSUMAB 60 MG/ML ~~LOC~~ SOSY
60.0000 mg | PREFILLED_SYRINGE | Freq: Once | SUBCUTANEOUS | Status: AC
  Administered 2021-10-25: 60 mg via SUBCUTANEOUS

## 2021-10-25 NOTE — Telephone Encounter (Signed)
Left message for patient to call back to schedule Medicare Annual Wellness Visit   Last AWV  09/01/20  Please schedule at anytime with LB Marston if patient calls the office back.    40 Minutes appointment   Any questions, please call me at [phone]

## 2021-10-25 NOTE — Progress Notes (Addendum)
Pt was given Prolia injection w/o any complications.  Medical screening examination/treatment/procedure(s) were performed by non-physician practitioner and as supervising physician I was immediately available for consultation/collaboration.  I agree with above. [name], MD 

## 2021-10-29 ENCOUNTER — Inpatient Hospital Stay: Payer: Medicare Other | Attending: Hematology & Oncology

## 2021-10-29 ENCOUNTER — Inpatient Hospital Stay (HOSPITAL_BASED_OUTPATIENT_CLINIC_OR_DEPARTMENT_OTHER): Payer: Medicare Other | Admitting: Hematology & Oncology

## 2021-10-29 ENCOUNTER — Other Ambulatory Visit: Payer: Self-pay

## 2021-10-29 ENCOUNTER — Encounter: Payer: Self-pay | Admitting: Hematology & Oncology

## 2021-10-29 VITALS — BP 141/82 | HR 80 | Temp 98.0°F | Resp 18 | Wt 199.0 lb

## 2021-10-29 DIAGNOSIS — I82509 Chronic embolism and thrombosis of unspecified deep veins of unspecified lower extremity: Secondary | ICD-10-CM

## 2021-10-29 DIAGNOSIS — M329 Systemic lupus erythematosus, unspecified: Secondary | ICD-10-CM | POA: Diagnosis not present

## 2021-10-29 DIAGNOSIS — Z7982 Long term (current) use of aspirin: Secondary | ICD-10-CM | POA: Diagnosis not present

## 2021-10-29 DIAGNOSIS — I82502 Chronic embolism and thrombosis of unspecified deep veins of left lower extremity: Secondary | ICD-10-CM | POA: Insufficient documentation

## 2021-10-29 LAB — CBC WITH DIFFERENTIAL (CANCER CENTER ONLY)
Abs Immature Granulocytes: 0.02 10*3/uL (ref 0.00–0.07)
Basophils Absolute: 0.1 10*3/uL (ref 0.0–0.1)
Basophils Relative: 1 %
Eosinophils Absolute: 0.3 10*3/uL (ref 0.0–0.5)
Eosinophils Relative: 3 %
HCT: 43.5 % (ref 39.0–52.0)
Hemoglobin: 14.4 g/dL (ref 13.0–17.0)
Immature Granulocytes: 0 %
Lymphocytes Relative: 37 %
Lymphs Abs: 3.4 10*3/uL (ref 0.7–4.0)
MCH: 31 pg (ref 26.0–34.0)
MCHC: 33.1 g/dL (ref 30.0–36.0)
MCV: 93.5 fL (ref 80.0–100.0)
Monocytes Absolute: 0.7 10*3/uL (ref 0.1–1.0)
Monocytes Relative: 8 %
Neutro Abs: 4.7 10*3/uL (ref 1.7–7.7)
Neutrophils Relative %: 51 %
Platelet Count: 239 10*3/uL (ref 150–400)
RBC: 4.65 MIL/uL (ref 4.22–5.81)
RDW: 13.6 % (ref 11.5–15.5)
WBC Count: 9.1 10*3/uL (ref 4.0–10.5)
nRBC: 0 % (ref 0.0–0.2)

## 2021-10-29 LAB — CMP (CANCER CENTER ONLY)
ALT: 12 U/L (ref 0–44)
AST: 16 U/L (ref 15–41)
Albumin: 4.3 g/dL (ref 3.5–5.0)
Alkaline Phosphatase: 48 U/L (ref 38–126)
Anion gap: 10 (ref 5–15)
BUN: 14 mg/dL (ref 8–23)
CO2: 26 mmol/L (ref 22–32)
Calcium: 9.3 mg/dL (ref 8.9–10.3)
Chloride: 103 mmol/L (ref 98–111)
Creatinine: 1 mg/dL (ref 0.61–1.24)
GFR, Estimated: >60 mL/min (ref >60)
Glucose, Bld: 117 mg/dL — ABNORMAL HIGH (ref 70–99)
Potassium: 4.1 mmol/L (ref 3.5–5.1)
Sodium: 139 mmol/L (ref 135–145)
Total Bilirubin: 0.7 mg/dL (ref 0.3–1.2)
Total Protein: 7 g/dL (ref 6.5–8.1)

## 2021-10-29 NOTE — Progress Notes (Signed)
Hematology and Oncology Follow Up Visit  Roger Kent [id number] June 07, 1946 75 y.o. 10/29/2021   Principle Diagnosis:  DVT of the right peroneal vein SVT of the right saphenous vein (+) lupus anti-coagulant  Current Therapy:   Xarelto 20 mg po q day -- 1 yr of therapeutic to complete in 05/2019 Xarelto 10 mg po q day -- maintenance x 47yr -- start 05/2019 -- completed on 06/2020 EC ASA 81 mg po q day -- started on 01/11/2019 -- changed to 325 mg po q day on 06/05/2020     Interim History:  Roger Kent is back for follow-up.  We saw him 6 months ago.  Since then, he is doing quite nicely.  He does have knee problems.  He is doing some integrative type of medicine for his knee as.  He is trying to avoid surgery.  He is on full dose aspirin.  He has had no problems with bleeding.  There has been no issues with nausea or vomiting.  He has had no fever.  He has had no rashes.  There has been no leg swelling or pain.  He has a performance status right now of Roger Kent.    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  Hopefully, he will be able to bring some honey to his 1 day.  He has brought this before and the honey has been incredibly tasty.    Medications:  Current Outpatient Medications:    aspirin 325 MG tablet, aspirin 325 mg tablet  Take 1 tablet every day by oral route., Disp: , Rfl:    cholecalciferol (VITAMIN D) 1000 UNITS tablet, Take 1,000 Units by mouth every morning., Disp: , Rfl:    Cyanocobalamin (VITAMIN B 12 PO), Take by mouth., Disp: , Rfl:    EPINEPHrine (EPIPEN 2-PAK) Kent.3 mg/Kent.3 mL IJ SOAJ injection, Inject Kent.3 mLs (Kent.3 mg total) into the muscle as needed for anaphylaxis., Disp: 1 Device, Rfl: 3   Evolocumab (REPATHA SURECLICK) 384 MG/ML SOAJ, INJECT ONE PEN INTO THE SKIN EVERY 14 DAYS, Disp: 6 mL, Rfl: 1   ezetimibe (ZETIA) 10 MG tablet, Take 10 mg by mouth daily., Disp: , Rfl:    glucosamine-chondroitin 500-400 MG tablet, Take 2 tablets by mouth every morning. , Disp: ,  Rfl:    Lactobacillus ([employer]S ACIDOPHILUS PO), Take by mouth daily., Disp: , Rfl:    loteprednol (LOTEMAX) Kent.5 % ophthalmic suspension, 3 (three) times daily., Disp: , Rfl:    Multiple Vitamin (MULTIVITAMIN WITH MINERALS) TABS tablet, Take 1 tablet by mouth every morning., Disp: , Rfl:    olmesartan (BENICAR) 40 MG tablet, Take 1 tablet (40 mg total) by mouth daily. Annual appt due in Sept must see provider for future refills, Disp: 90 tablet, Rfl: Kent   olopatadine (PATADAY) Kent.1 % ophthalmic solution, Place 1 drop into both eyes daily at 6 (six) AM., Disp: , Rfl:    omeprazole-sodium bicarbonate (ZEGERID) 40-1100 MG per capsule, Take 1 capsule by mouth daily before breakfast., Disp: , Rfl:    OVER THE COUNTER MEDICATION, Take 3 capsules by mouth every morning. OTC herbal supplement for prostate health, Disp: , Rfl:    prednisoLONE acetate (PRED FORTE) 1 % ophthalmic suspension, 1 drop 2 (two) times daily., Disp: , Rfl:    Lifitegrast 5 % SOLN, Apply to eye 2 (two) times daily., Disp: , Rfl:   Allergies:  Allergies  Allergen Reactions   Oxycodone-Acetaminophen Other (See Comments)    Other Reaction: agitation   Crestor [  Rosuvastatin]    Zetia [Ezetimibe]     Brain fog, felt sluggish   Lamisil [Terbinafine] Rash    Past Medical History, Surgical history, Social history, and Family History were reviewed and updated.  Review of Systems: Review of Systems  Constitutional: Negative.   HENT:  Negative.    Eyes: Negative.   Respiratory: Negative.    Cardiovascular: Negative.   Gastrointestinal: Negative.   Endocrine: Negative.   Genitourinary: Negative.    Musculoskeletal: Negative.   Skin: Negative.   Neurological: Negative.   Hematological: Negative.   Psychiatric/Behavioral: Negative.     Physical Exam:  weight is 199 lb (90.3 kg). His oral temperature is 98 F (36.7 C). His blood pressure is 141/82 (abnormal) and his pulse is 80. His respiration is 18 and oxygen saturation is 99%.    Wt Readings from Last 3 Encounters:  10/29/21 199 lb (90.3 kg)  04/30/21 190 lb (86.2 kg)  01/02/21 188 lb 12.8 oz (85.6 kg)    Physical Exam Vitals reviewed.  HENT:     Head: Normocephalic and atraumatic.  Eyes:     Pupils: Pupils are equal, round, and reactive to light.  Cardiovascular:     Rate and Rhythm: Normal rate and regular rhythm.     Heart sounds: Normal heart sounds.  Pulmonary:     Effort: Pulmonary effort is normal.     Breath sounds: Normal breath sounds.  Abdominal:     General: Bowel sounds are normal.     Palpations: Abdomen is soft.  Musculoskeletal:        General: No tenderness or deformity. Normal range of motion.     Cervical back: Normal range of motion.  Lymphadenopathy:     Cervical: No cervical adenopathy.  Skin:    General: Skin is warm and dry.     Findings: No erythema or rash.  Neurological:     Mental Status: He is alert and oriented to person, place, and time.  Psychiatric:        Behavior: Behavior normal.        Thought Content: Thought content normal.        Judgment: Judgment normal.     Lab Results  Component Value Date   WBC 9.1 10/29/2021   HGB 14.4 10/29/2021   HCT 43.5 10/29/2021   MCV 93.5 10/29/2021   PLT 239 10/29/2021     Chemistry      Component Value Date/Time   NA 139 10/29/2021 0755   NA 140 05/10/2021 0842   K 4.1 10/29/2021 0755   CL 103 10/29/2021 0755   CO2 26 10/29/2021 0755   BUN 14 10/29/2021 0755   BUN 15 05/10/2021 0842   CREATININE 1.00 10/29/2021 0755   CREATININE Kent.98 08/01/2020 0924      Component Value Date/Time   CALCIUM 9.3 10/29/2021 0755   ALKPHOS 48 10/29/2021 0755   AST 16 10/29/2021 0755   ALT 12 10/29/2021 0755   BILITOT Kent.7 10/29/2021 0755       Impression and Plan: Roger Kent is a 75 year old white male.  He had a thrombus in the left leg about 6 years ago.  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  I suspect he will always have this.  I do not see any  indication that we have to do another Doppler.  Hopefully, he will not need knee surgery.  If he does need knee surgery, we will certainly arrange for formal anticoagulation.  We will plan  for another 77-month follow-up.   Volanda Napoleon, MD 12/12/20228:49 AM

## 2021-10-31 LAB — LUPUS ANTICOAGULANT PANEL
DRVVT: 32.3 s (ref 0.0–47.0)
PTT Lupus Anticoagulant: 30 s (ref 0.0–51.9)

## 2021-11-01 LAB — CARDIOLIPIN ANTIBODIES, IGG, IGM, IGA
Anticardiolipin IgA: <9 APL U/mL (ref 0–11)
Anticardiolipin IgG: <9 GPL U/mL (ref 0–14)
Anticardiolipin IgM: <9 MPL U/mL (ref 0–12)

## 2021-11-06 ENCOUNTER — Ambulatory Visit (INDEPENDENT_AMBULATORY_CARE_PROVIDER_SITE_OTHER): Payer: Medicare Other

## 2021-11-06 ENCOUNTER — Other Ambulatory Visit: Payer: Self-pay

## 2021-11-06 DIAGNOSIS — Z0000 Encounter for general adult medical examination without abnormal findings: Secondary | ICD-10-CM

## 2021-11-06 NOTE — Progress Notes (Addendum)
I connected with Shogo Larkey today by telephone and verified that I am speaking with the correct person using two identifiers. Location patient: home Location provider: work Persons participating in the virtual visit: patient, provider.   I discussed the limitations, risks, security and privacy concerns of performing an evaluation and management service by telephone and the availability of in person appointments. I also discussed with the patient that there may be a patient responsible charge related to this service. The patient expressed understanding and verbally consented to this telephonic visit.    Interactive audio and video telecommunications were attempted between this provider and patient, however failed, due to patient having technical difficulties OR patient did not have access to video capability.  We continued and completed visit with audio only.  Some vital signs may be absent or patient reported.   Time Spent with patient on telephone encounter: 40 minutes  Subjective:   Roger Kent is a 75 y.o. male who presents for Medicare Annual/Subsequent preventive examination.  Review of Systems     Cardiac Risk Factors include: advanced age (>82men, >23 women);dyslipidemia;hypertension;family history of premature cardiovascular disease;male gender     Objective:    Today's Vitals   11/06/21 1050  PainSc: 2    There is no height or weight on file to calculate BMI.  Advanced Directives 11/06/2021 10/29/2021 10/30/2020 09/01/2020 12/07/2019 08/10/2019 05/11/2019  Does Patient Have a Medical Advance Directive? Yes Yes Yes Yes Yes Yes No  Type of Advance Directive Living will;Healthcare Power of [place];Living will [place];Living will Living will;Healthcare Power of [place];Living will [place];Living will -  Does patient want to make changes to medical advance  directive? No - Patient declined - No - Patient declined No - Patient declined - No - Patient declined -  Copy of [place] in Chart? No - copy requested No - copy requested No - copy requested No - copy requested No - copy requested No - copy requested -  Would patient like information on creating a medical advance directive? - - No - Patient declined - - - -    Current Medications (verified) Outpatient Encounter Medications as of 11/06/2021  Medication Sig   aspirin 325 MG tablet aspirin 325 mg tablet  Take 1 tablet every day by oral route.   cholecalciferol (VITAMIN D) 1000 UNITS tablet Take 1,000 Units by mouth every morning.   Cyanocobalamin (VITAMIN B 12 PO) Take by mouth.   EPINEPHrine (EPIPEN 2-PAK) 0.3 mg/0.3 mL IJ SOAJ injection Inject 0.3 mLs (0.3 mg total) into the muscle as needed for anaphylaxis.   Evolocumab (REPATHA SURECLICK) 932 MG/ML SOAJ INJECT ONE PEN INTO THE SKIN EVERY 14 DAYS   ezetimibe (ZETIA) 10 MG tablet Take 10 mg by mouth daily.   glucosamine-chondroitin 500-400 MG tablet Take 2 tablets by mouth every morning.    Lactobacillus (CVS ACIDOPHILUS PO) Take by mouth daily.   Lifitegrast 5 % SOLN Apply to eye 2 (two) times daily.   loteprednol (LOTEMAX) 0.5 % ophthalmic suspension 3 (three) times daily.   Multiple Vitamin (MULTIVITAMIN WITH MINERALS) TABS tablet Take 1 tablet by mouth every morning.   olmesartan (BENICAR) 40 MG tablet Take 1 tablet (40 mg total) by mouth daily. Annual appt due in Sept must see provider for future refills   olopatadine (PATADAY) 0.1 % ophthalmic solution Place 1 drop into both eyes daily at 6 (six) AM.   omeprazole-sodium  bicarbonate (ZEGERID) 40-1100 MG per capsule Take 1 capsule by mouth daily before breakfast.   OVER THE COUNTER MEDICATION Take 3 capsules by mouth every morning. OTC herbal supplement for prostate health   prednisoLONE acetate (PRED FORTE) 1 % ophthalmic suspension 1 drop 2 (two) times daily.   No  facility-administered encounter medications on file as of 11/06/2021.    Allergies (verified) Oxycodone-acetaminophen, Crestor [rosuvastatin], Zetia [ezetimibe], and Lamisil [terbinafine]   History: Past Medical History:  Diagnosis Date   Acute hepatitis C without mention of hepatic coma(070.51)    Allergic rhinitis, cause unspecified    Anxiety state, unspecified    Barrett's esophagus    BPH (benign prostatic hyperplasia)    DVT (deep venous thrombosis) (HCC)    Elevated PSA    Dr Karsten Ro   Esophageal reflux    Hiatal hernia    Hyperlipidemia    Hypertension    Hypertonicity of bladder    Insomnia    Leg DVT (deep venous thromboembolism), chronic, unspecified laterality ([place]) 09/21/2018   Osteoporosis, unspecified    Personal history of urinary calculi    Recent retinal detachment, partial, with giant tear    Unspecified tinnitus    Ventricular septal defect    Past Surgical History:  Procedure Laterality Date   KNEE SURGERY     RETINAL DETACHMENT SURGERY     Family History  Problem Relation Age of Onset   Hypertension Other    Alcohol abuse Neg Hx    Social History   Socioeconomic History   Marital status: Married    Spouse name: [name] [place]   Number of children: Not on file   Years of education: Not on file   Highest education level: Not on file  Occupational History   Occupation: retired    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   Occupation: bee keepin  Tobacco Use   Smoking status: Former   Smokeless tobacco: Never  Scientific laboratory technician Use: Never used  Substance and Sexual Activity   Alcohol use: No   Drug use: No   Sexual activity: Yes  Other Topics Concern   Not on file  Social History Narrative               Social Determinants of Health   Financial Resource Strain: Low Risk    Difficulty of Paying Living Expenses: Not hard at all  Food Insecurity: Unknown   Worried About Charity fundraiser in the Last Year: Never true   Arts development officer in the Last Year: Not on file  Transportation Needs: No Transportation Needs   Lack of Transportation (Medical): No   Lack of Transportation (Non-Medical): No  Physical Activity: Sufficiently Active   Days of Exercise per Week: 7 days   Minutes of Exercise per Session: 30 min  Stress: No Stress Concern Present   Feeling of Stress : Not at all  Social Connections: Socially Integrated   Frequency of Communication with Friends and Family: More than three times a week   Frequency of Social Gatherings with Friends and Family: More than three times a week   Attends Religious Services: More than 4 times per year   Active Member of [employer] or Organizations: Yes   Attends Music therapist: More than 4 times per year   Marital Status: Married    Tobacco Counseling Counseling given: Not Answered   Clinical Intake:  Pre-visit preparation completed: Yes  Pain : 0-10  Pain Score: 2  Pain Type: Chronic pain Pain Location: Knee Pain Orientation: Right Pain Descriptors / Indicators: Constant Pain Onset: More than a month ago Pain Frequency: Constant     Nutritional Risks: None Diabetes: No  How often do you need to have someone help you when you read instructions, pamphlets, or other written materials from your doctor or pharmacy?: 1 - Never What is the last grade level you completed in school?: 2-3 years of college courses; 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 no  Interpreter Needed?: No  Information entered by :: Lisette Abu, LPN   Activities of Daily Living In your present state of health, do you have any difficulty performing the following activities: 11/06/2021  Hearing? N  Vision? N  Difficulty concentrating or making decisions? N  Walking or climbing stairs? N  Dressing or bathing? N  Doing errands, shopping? N  Preparing Food and eating ? N  Using the Toilet? N  In the past six months, have you  accidently leaked urine? N  Do you have problems with loss of bowel control? N  Managing your Medications? N  Managing your Finances? N  Housekeeping or managing your Housekeeping? N  Some recent data might be hidden    Patient Care Team: Plotnikov, Evie Lacks, MD as PCP - General Kathie Rhodes, MD (Inactive) (Urology) Richmond Campbell, MD (Gastroenterology) Stanford Breed Denice Bors, MD (Cardiology) Vevelyn Royals, MD as Consulting Physician (Ophthalmology) Elouise Munroe, MD as Consulting Physician (Cardiology)  Indicate any recent Medical Services you may have received from other than [facility] providers in the past year (date may be approximate).     Assessment:   This is a routine wellness examination for Roger Kent.  Hearing/Vision screen Hearing Screening - Comments:: Patient wears hearing aids. Vision Screening - Comments:: Patient wears corrective glasses/contacts.  Eye exam done annually by: Dr. Chrystie Nose. Zigmund Daniel (Retina Specialist)  Dietary issues and exercise activities discussed: Current Exercise Habits: Home exercise routine;Structured exercise class, Type of exercise: walking;yoga;strength training/weights;stretching;Other - see comments (swimming), Time (Minutes): 30, Frequency (Times/Week): 7, Weekly Exercise (Minutes/Week): 210, Intensity: Moderate, Exercise limited by: orthopedic condition(s)   Goals Addressed               This Visit's Progress     Patient Stated (pt-stated)        My goal for 2023 is to increase my physical activity to help reduce the chance of having my left knee replaced.      Depression Screen PHQ 2/9 Scores 11/06/2021 09/01/2020 08/01/2020 07/15/2019 04/06/2018 11/05/2016 08/21/2015  PHQ - 2 Score 0 0 0 0 0 0 0    Fall Risk Fall Risk  11/06/2021 09/01/2020 08/01/2020 07/15/2019 04/06/2018  Falls in the past year? 0 0 0 0 No  Number falls in past yr: 0 0 0 0 -  Injury with Fall? 0 0 0 0 -  Risk for fall due to : No Fall Risks No Fall Risks - - -   Follow up Falls prevention discussed Falls evaluation completed - - -    FALL RISK PREVENTION PERTAINING TO THE HOME:  Any stairs in or around the home? Yes  If so, are there any without handrails? No  Home free of loose throw rugs in walkways, pet beds, electrical cords, etc? Yes  Adequate lighting in your home to reduce risk of falls? Yes   ASSISTIVE DEVICES UTILIZED TO PREVENT FALLS:  Life alert? No  Use of a  cane, walker or w/c? No  Grab bars in the bathroom? No  Shower chair or bench in shower? No  Elevated toilet seat or a handicapped toilet? No   TIMED UP AND GO:  Was the test performed? No .  Length of time to ambulate 10 feet: n/a sec.   Gait steady and fast without use of assistive device  Cognitive Function: Normal cognitive status assessed by direct observation by this Nurse Health Advisor. No abnormalities found.          Immunizations Immunization History  Administered Date(s) Administered   Fluad Quad(high Dose 65+) 07/15/2019, 08/01/2020, 07/25/2021   Influenza Split 09/16/2011, 08/04/2012   Influenza Whole 09/07/2007, 08/25/2008, 08/29/2009, 07/13/2010   Influenza, High Dose Seasonal PF 09/07/2016, 07/14/2017, 08/24/2018   Influenza,inj,Quad PF,6+ Mos 08/17/2013, 11/30/2014, 08/21/2015   PFIZER(Purple Top)SARS-COV-2 Vaccination 12/30/2019, 01/23/2020, 09/06/2020, 07/08/2021   Pneumococcal Conjugate-13 11/09/2013   Pneumococcal Polysaccharide-23 11/13/2007, 11/22/2015   Td 10/21/2012   Tdap 07/03/2013   Zoster Recombinat (Shingrix) 05/21/2017, 07/25/2017   Zoster, Live 10/29/2012    TDAP status: Up to date  Flu Vaccine status: Up to date  Pneumococcal vaccine status: Up to date  Covid-19 vaccine status: Completed vaccines  Qualifies for Shingles Vaccine? Yes   Zostavax completed Yes   Shingrix Completed?: Yes  Screening Tests Health Maintenance  Topic Date Due   COVID-19 Vaccine (5 - Booster for Pfizer series) 09/02/2021   COLONOSCOPY  (Pts 45-66yrs Insurance coverage will need to be confirmed)  05/21/2023   TETANUS/TDAP  07/04/2023   Pneumonia Vaccine 96+ Years old  Completed   INFLUENZA VACCINE  Completed   Hepatitis C Screening  Completed   Zoster Vaccines- Shingrix  Completed   HPV VACCINES  Aged Out    Health Maintenance  Health Maintenance Due  Topic Date Due   COVID-19 Vaccine (5 - Booster for [place] series) 09/02/2021    Colorectal cancer screening: Type of screening: Colonoscopy. Completed 05/20/2013. Repeat every 10 years  Lung Cancer Screening: (Low Dose CT Chest recommended if Age 33-80 years, 30 pack-year currently smoking OR have quit w/in 15years.) does not qualify.   Lung Cancer Screening Referral: no  Additional Screening:  Hepatitis C Screening: does qualify; Completed yes  Vision Screening: Recommended annual ophthalmology exams for early detection of glaucoma and other disorders of the eye. Is the patient up to date with their annual eye exam?  Yes  Who is the provider or what is the name of the office in which the patient attends annual eye exams? Tempie Hoist, MD. If pt is not established with a provider, would they like to be referred to a provider to establish care? No .   Dental Screening: Recommended annual dental exams for proper oral hygiene  Community Resource Referral / Chronic Care Management: CRR required this visit?  No   CCM required this visit?  No      Plan:     I have personally reviewed and noted the following in the patients chart:   Medical and social history Use of alcohol, tobacco or illicit drugs  Current medications and supplements including opioid prescriptions. Patient is not currently taking opioid prescriptions. Functional ability and status Nutritional status Physical activity Advanced directives List of other physicians Hospitalizations, surgeries, and ER visits in previous 12 months Vitals Screenings to include cognitive, depression, and  falls Referrals and appointments  In addition, I have reviewed and discussed with patient certain preventive protocols, quality metrics, and best practice recommendations. A written personalized care  plan for preventive services as well as general preventive health recommendations were provided to patient.     Sheral Flow, LPN   [id number]    Nurse Notes: Patient is cogitatively intact. There were no vitals filed for this visit. There is no height or weight on file to calculate BMI. Patient stated that he has no issues with gait or balance; does not use any assistive devices. Hearing Screening - Comments:: Patient wears hearing aids. Vision Screening - Comments:: Patient wears corrective glasses/contacts.  Eye exam done annually by: Dr. Chrystie Nose. Zigmund Daniel (Retina Specialist)  Medical screening examination/treatment/procedure(s) were performed by non-physician practitioner and as supervising physician I was immediately available for consultation/collaboration.  I agree with above. Lew Dawes, MD

## 2021-11-25 ENCOUNTER — Other Ambulatory Visit: Payer: Self-pay | Admitting: Internal Medicine

## 2021-11-26 ENCOUNTER — Other Ambulatory Visit: Payer: Self-pay | Admitting: Cardiology

## 2021-11-26 DIAGNOSIS — I2583 Coronary atherosclerosis due to lipid rich plaque: Secondary | ICD-10-CM

## 2021-11-26 DIAGNOSIS — E7800 Pure hypercholesterolemia, unspecified: Secondary | ICD-10-CM

## 2021-11-26 DIAGNOSIS — I2510 Atherosclerotic heart disease of native coronary artery without angina pectoris: Secondary | ICD-10-CM

## 2021-12-01 NOTE — Telephone Encounter (Signed)
Last Prolia inj 10/25/21 Next Prolia inj due 04/26/22

## 2021-12-28 NOTE — Progress Notes (Signed)
HPI:FU congenital VSD, and prior DVT. Calcium score June 2020-277 and mildly dilated ascending aorta at 4.3 cm. Nuclear study June 2020 showed ejection fraction 51%, small fixed apical defect felt to be artifact and no ischemia. Echocardiogram February 2021 showed normal LV function, small restrictive ventricular septal defect with left-to-right shunting, mild aortic insufficiency, mildly dilated ascending aorta at 41 mm.  Venous Dopplers December 2021 showed chronic partially occlusive thrombus within the left superficial femoral and popliteal veins.  Abdominal ultrasound January 2022 showed no abdominal aortic aneurysm.  There was abnormal dilatation of the right common iliac artery, left common iliac artery, right external iliac artery and left external iliac artery.  Follow-up recommended 24 months.  CTA June 2022 showed 4.1 cm ascending thoracic aortic aneurysm.  Since I last saw him he denies dyspnea, chest pain, palpitations or syncope.  He does complain of back and knee pain bilaterally.  There is concerned this may be related to his Repatha.  Current Outpatient Medications  Medication Sig Dispense Refill   aspirin 325 MG tablet aspirin 325 mg tablet  Take 1 tablet every day by oral route.     cholecalciferol (VITAMIN D) 1000 UNITS tablet Take 1,000 Units by mouth every morning.     Cyanocobalamin (VITAMIN B 12 PO) Take by mouth.     EPINEPHrine (EPIPEN 2-PAK) 0.3 mg/0.3 mL IJ SOAJ injection Inject 0.3 mLs (0.3 mg total) into the muscle as needed for anaphylaxis. 1 Device 3   glucosamine-chondroitin 500-400 MG tablet Take 2 tablets by mouth every morning.      Lactobacillus (CVS ACIDOPHILUS PO) Take by mouth daily.     Multiple Vitamin (MULTIVITAMIN WITH MINERALS) TABS tablet Take 1 tablet by mouth every morning.     olmesartan (BENICAR) 40 MG tablet TAKE 1 TABLET DAILY 90 tablet 3   olopatadine (PATANOL) 0.1 % ophthalmic solution Place 1 drop into both eyes daily at 6 (six) AM.      omeprazole-sodium bicarbonate (ZEGERID) 40-1100 MG per capsule Take 1 capsule by mouth daily before breakfast.     OVER THE COUNTER MEDICATION Take 3 capsules by mouth every morning. OTC herbal supplement for prostate health     REPATHA SURECLICK 259 MG/ML SOAJ INJECT ONE PEN (140MG ) INTO THE SKIN EVERY 14 DAYS 6 mL 2   No current facility-administered medications for this visit.     Past Medical History:  Diagnosis Date   Acute hepatitis C without mention of hepatic coma(070.51)    Allergic rhinitis, cause unspecified    Anxiety state, unspecified    Barrett's esophagus    BPH (benign prostatic hyperplasia)    DVT (deep venous thrombosis) (HCC)    Elevated PSA    Dr Karsten Ro   Esophageal reflux    Hiatal hernia    Hyperlipidemia    Hypertension    Hypertonicity of bladder    Insomnia    Leg DVT (deep venous thromboembolism), chronic, unspecified laterality ([place]) 09/21/2018   Osteoporosis, unspecified    Personal history of urinary calculi    Recent retinal detachment, partial, with giant tear    Unspecified tinnitus    Ventricular septal defect     Past Surgical History:  Procedure Laterality Date   KNEE SURGERY     RETINAL DETACHMENT SURGERY      Social History   Socioeconomic History   Marital status: Married    Spouse name: Falon Flinchum   Number of children: Not on file   Years of education:  Not on file   Highest education level: Not on file  Occupational History   Occupation: retired    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   Occupation: bee keepin  Tobacco Use   Smoking status: Former   Smokeless tobacco: Never  Scientific laboratory technician Use: Never used  Substance and Sexual Activity   Alcohol use: No   Drug use: No   Sexual activity: Yes  Other Topics Concern   Not on file  Social History Narrative               Social Determinants of Health   Financial Resource Strain: Low Risk    Difficulty of Paying Living Expenses: Not hard at all   Food Insecurity: Unknown   Worried About Charity fundraiser in the Last Year: Never true   [place] in the Last Year: Not on file  Transportation Needs: No Transportation Needs   Lack of Transportation (Medical): No   Lack of Transportation (Non-Medical): No  Physical Activity: Sufficiently Active   Days of Exercise per Week: 7 days   Minutes of Exercise per Session: 30 min  Stress: No Stress Concern Present   Feeling of Stress : Not at all  Social Connections: Socially Integrated   Frequency of Communication with Friends and Family: More than three times a week   Frequency of Social Gatherings with Friends and Family: More than three times a week   Attends Religious Services: More than 4 times per year   Active Member of [employer] or Organizations: Yes   Attends Music therapist: More than 4 times per year   Marital Status: Married  Human resources officer Violence: Not At Risk   Fear of Current or Ex-Partner: No   Emotionally Abused: No   Physically Abused: No   Sexually Abused: No    Family History  Problem Relation Age of Onset   Hypertension Other    Alcohol abuse Neg Hx     ROS: Knee arthralgias but no fevers or chills, productive cough, hemoptysis, dysphasia, odynophagia, melena, hematochezia, dysuria, hematuria, rash, seizure activity, orthopnea, PND, pedal edema, claudication. Remaining systems are negative.  Physical Exam: Well-developed well-nourished in no acute distress.  Skin is warm and dry.  HEENT is normal.  Neck is supple.  Chest is clear to auscultation with normal expansion.  Cardiovascular exam is regular rate and rhythm.  Abdominal exam nontender or distended. No masses palpated. Extremities show no edema. neuro grossly intact  ECG-normal sinus rhythm at a rate of 72, no ST changes.  Personally reviewed  A/P  1 ventricular septal defect-most recent echocardiogram showed small defect in right side not enlarged.  We will likely repeat  study when he returns in 1 year.  2 thoracic aortic aneurysm-we will plan follow-up CTA June 2023.  If unchanged in size we will likely repeat every 2 to 3 years.  3 iliac aneurysms-plan follow-up ultrasound January 2024.  4 coronary artery disease-based on previous elevated calcium score.  Continue aspirin.  He is intolerant to statins.  5 hypertension-blood pressure borderline but typically controlled at home.  Continue present medications and follow.  6 history of aortic insufficiency-mild on most recent echocardiogram.  7 hyperlipidemia-patient is intolerant to statins.  He is concerned that Repatha could be causing back pain.  We will therefore discontinue for 8 weeks to see if his symptoms improve.  If so we will try a different agent.  If his back  pain persists we will resume Repatha at previous dose and check lipids and liver 8 weeks later.  I would like for his LDL to be less than 50 given his history of elevated calcium score.  Kirk Ruths, MD

## 2022-01-10 ENCOUNTER — Other Ambulatory Visit: Payer: Self-pay

## 2022-01-10 ENCOUNTER — Encounter: Payer: Self-pay | Admitting: Cardiology

## 2022-01-10 ENCOUNTER — Ambulatory Visit (INDEPENDENT_AMBULATORY_CARE_PROVIDER_SITE_OTHER): Payer: Medicare Other | Admitting: Cardiology

## 2022-01-10 VITALS — BP 138/74 | HR 72 | Ht 73.0 in | Wt 196.4 lb

## 2022-01-10 DIAGNOSIS — I723 Aneurysm of iliac artery: Secondary | ICD-10-CM

## 2022-01-10 DIAGNOSIS — I712 Thoracic aortic aneurysm, without rupture, unspecified: Secondary | ICD-10-CM

## 2022-01-10 DIAGNOSIS — R931 Abnormal findings on diagnostic imaging of heart and coronary circulation: Secondary | ICD-10-CM | POA: Diagnosis not present

## 2022-01-10 DIAGNOSIS — E7800 Pure hypercholesterolemia, unspecified: Secondary | ICD-10-CM | POA: Diagnosis not present

## 2022-01-10 DIAGNOSIS — I2510 Atherosclerotic heart disease of native coronary artery without angina pectoris: Secondary | ICD-10-CM | POA: Diagnosis not present

## 2022-01-10 DIAGNOSIS — Q210 Ventricular septal defect: Secondary | ICD-10-CM

## 2022-01-10 DIAGNOSIS — I10 Essential (primary) hypertension: Secondary | ICD-10-CM | POA: Diagnosis not present

## 2022-01-10 DIAGNOSIS — I2583 Coronary atherosclerosis due to lipid rich plaque: Secondary | ICD-10-CM

## 2022-01-10 NOTE — Patient Instructions (Signed)
Medication Instructions:   DO NOT TAKE REPATHA FOR 8 WEEKS AND THEN CALL WITH HOW YOU ARE FEELING  *If you need a refill on your cardiac medications before your next appointment, please call your pharmacy*   Follow-Up: At [facility], you and your health needs are our priority.  As part of our continuing mission to provide you with exceptional heart care, we have created designated Provider Care Teams.  These Care Teams include your primary Cardiologist (physician) and Advanced Practice Providers (APPs -  Physician Assistants and Nurse Practitioners) who all work together to provide you with the care you need, when you need it.  We recommend signing up for the patient portal called "MyChart".  Sign up information is provided on this After Visit Summary.  MyChart is used to connect with patients for Virtual Visits (Telemedicine).  Patients are able to view lab/test results, encounter notes, upcoming appointments, etc.  Non-urgent messages can be sent to your provider as well.   To learn more about what you can do with MyChart, go to [email or web address].    Your next appointment:   6 month(s)  The format for your next appointment:   In Person  Provider:   Kirk Ruths MD

## 2022-01-30 DIAGNOSIS — Z20822 Contact with and (suspected) exposure to covid-19: Secondary | ICD-10-CM | POA: Diagnosis not present

## 2022-02-01 ENCOUNTER — Telehealth: Payer: Self-pay | Admitting: Internal Medicine

## 2022-02-01 DIAGNOSIS — M810 Age-related osteoporosis without current pathological fracture: Secondary | ICD-10-CM

## 2022-02-01 DIAGNOSIS — M1990 Unspecified osteoarthritis, unspecified site: Secondary | ICD-10-CM

## 2022-02-01 NOTE — Telephone Encounter (Signed)
Patient spouse [name] calling in ? ?Says patient needs referral to rheumatology placed ? ?Please let her know when referral has been submitted [phone] ?

## 2022-02-02 DIAGNOSIS — Z20822 Contact with and (suspected) exposure to covid-19: Secondary | ICD-10-CM | POA: Diagnosis not present

## 2022-02-05 NOTE — Telephone Encounter (Signed)
OK. Thx

## 2022-02-12 ENCOUNTER — Encounter (INDEPENDENT_AMBULATORY_CARE_PROVIDER_SITE_OTHER): Payer: Medicare Other | Admitting: Ophthalmology

## 2022-02-12 ENCOUNTER — Other Ambulatory Visit: Payer: Self-pay

## 2022-02-12 DIAGNOSIS — H43813 Vitreous degeneration, bilateral: Secondary | ICD-10-CM

## 2022-02-12 DIAGNOSIS — H338 Other retinal detachments: Secondary | ICD-10-CM | POA: Diagnosis not present

## 2022-02-12 DIAGNOSIS — I10 Essential (primary) hypertension: Secondary | ICD-10-CM

## 2022-02-12 DIAGNOSIS — H35033 Hypertensive retinopathy, bilateral: Secondary | ICD-10-CM | POA: Diagnosis not present

## 2022-02-12 DIAGNOSIS — H59033 Cystoid macular edema following cataract surgery, bilateral: Secondary | ICD-10-CM | POA: Diagnosis not present

## 2022-02-15 DIAGNOSIS — Z20822 Contact with and (suspected) exposure to covid-19: Secondary | ICD-10-CM | POA: Diagnosis not present

## 2022-02-17 ENCOUNTER — Encounter: Payer: Self-pay | Admitting: Cardiology

## 2022-02-17 DIAGNOSIS — E7800 Pure hypercholesterolemia, unspecified: Secondary | ICD-10-CM

## 2022-02-20 ENCOUNTER — Encounter: Payer: Self-pay | Admitting: Internal Medicine

## 2022-02-26 ENCOUNTER — Other Ambulatory Visit: Payer: Self-pay | Admitting: Internal Medicine

## 2022-02-26 DIAGNOSIS — M1990 Unspecified osteoarthritis, unspecified site: Secondary | ICD-10-CM

## 2022-03-03 IMAGING — CT CT ANGIO CHEST
2 series · 17 of 32 positions shown · IV contrast (APPLIED)
Comparison: Cardiac CT-05/04/2019; chest CT-12/15/2009

CLINICAL DATA: Follow-up thoracic aortic aneurysm.

EXAM:
CT ANGIOGRAPHY CHEST WITH CONTRAST
TECHNIQUE: Multidetector CT imaging of the chest was performed using the
standard protocol during bolus administration of intravenous
contrast. Multiplanar CT image reconstructions and MIPs were
obtained to evaluate the vascular anatomy.
CONTRAST:  75mL ABY9CG-KTF IOPAMIDOL (ABY9CG-KTF) INJECTION 76%

[Series 4: chest angio · axial · 0.76mm/px · z∈[-338,-48]mm · 11 of 117 slices shown]
[im 10/117  lung]
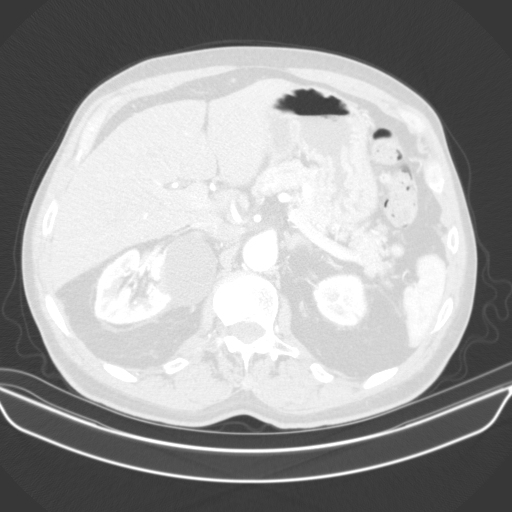
[im 20/117  soft-tissue]
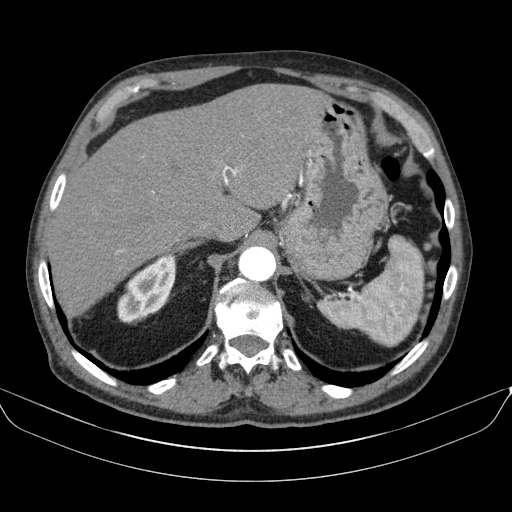
[im 30/117  lung]
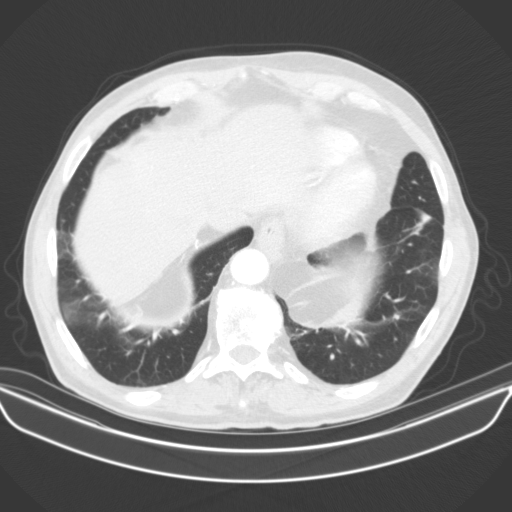
[im 39/117  soft-tissue]
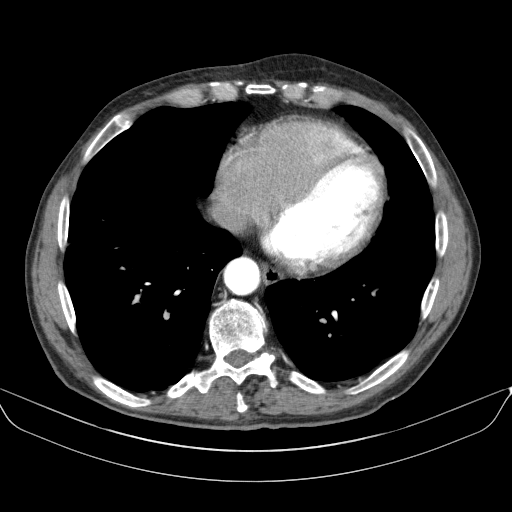
[im 49/117  lung]
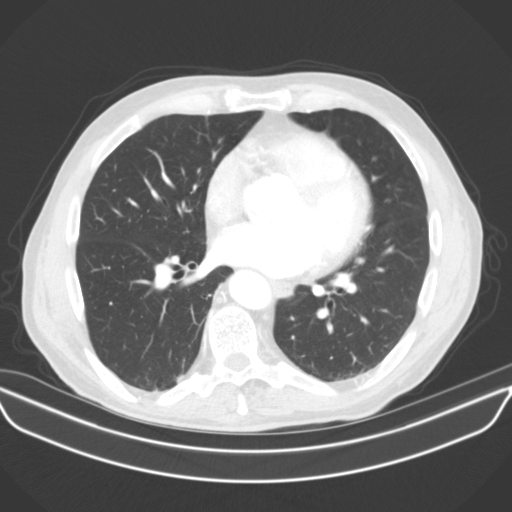
[im 59/117  soft-tissue]
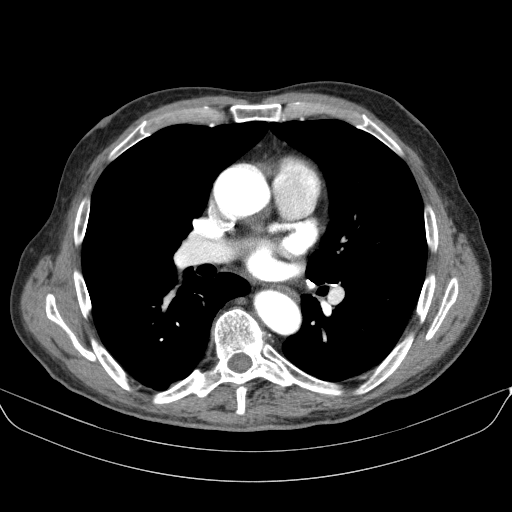
[im 68/117  lung]
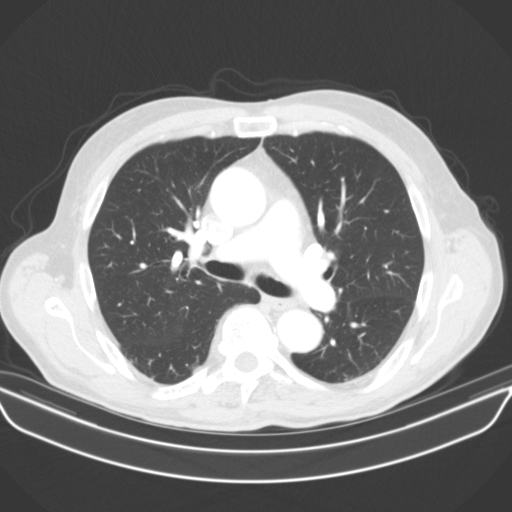
[im 78/117  soft-tissue]
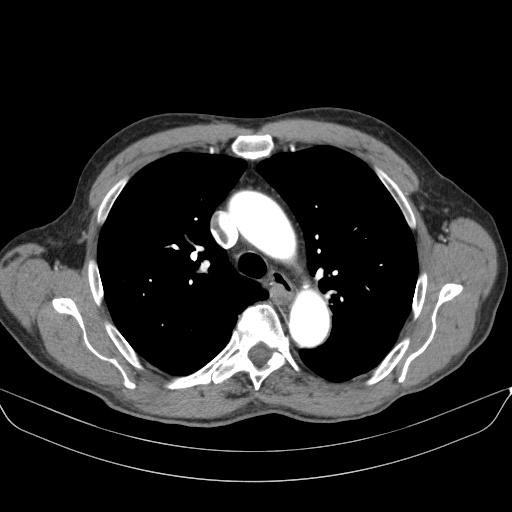
[im 88/117  lung]
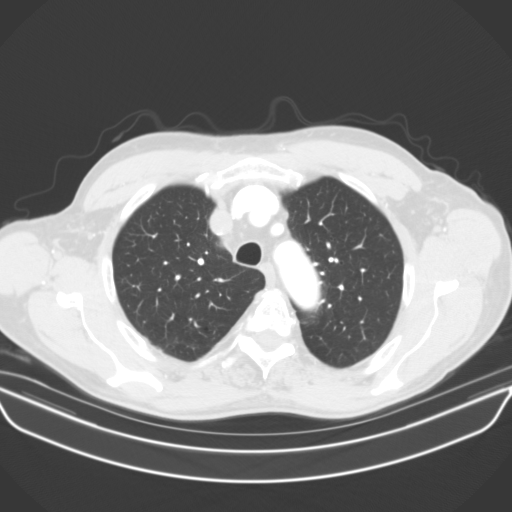
[im 97/117  soft-tissue]
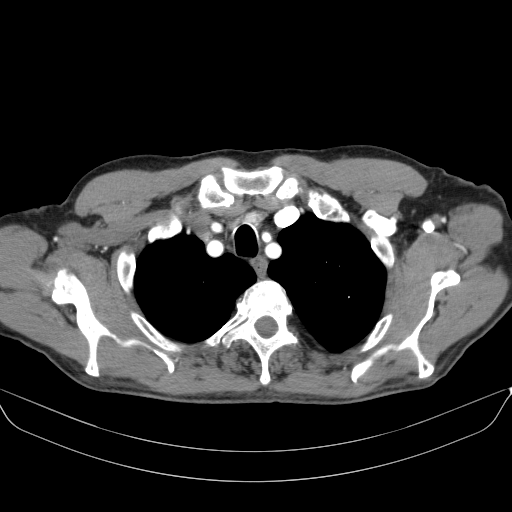
[im 107/117  lung]
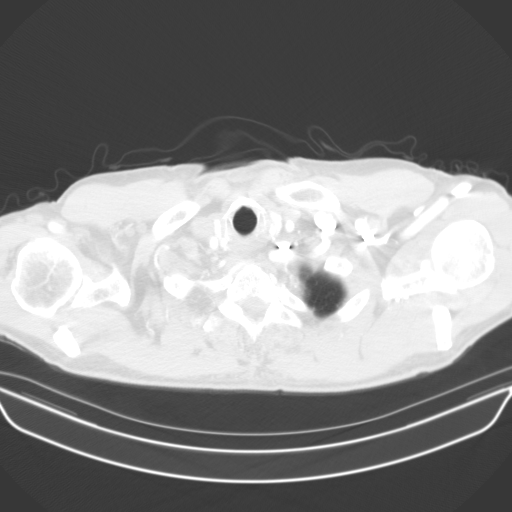

[Series 7: lung · axial · 0.76mm/px · z∈[-324,-134]mm · 6 of 173 slices shown]
[im 20/173  soft-tissue]
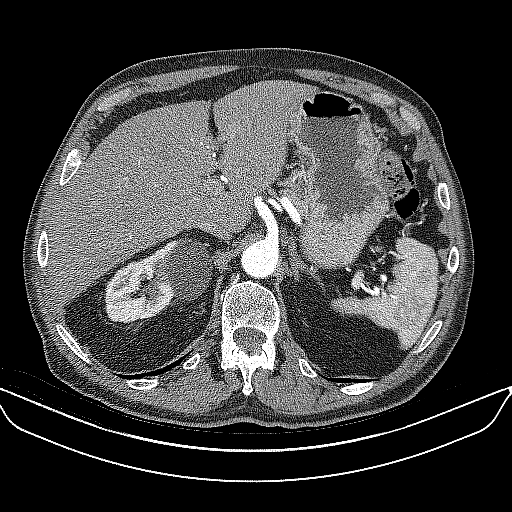
[im 39/173  soft-tissue]
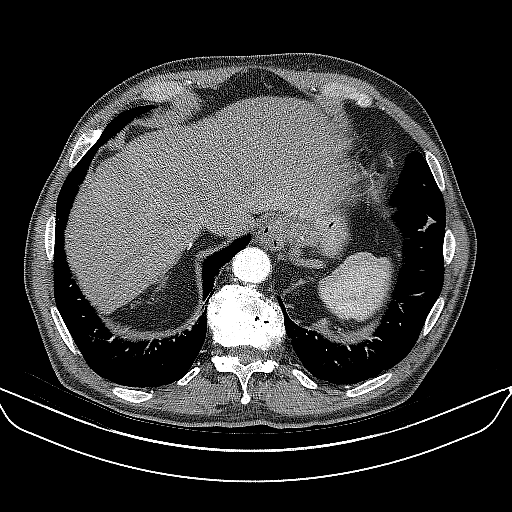
[im 58/173  soft-tissue]
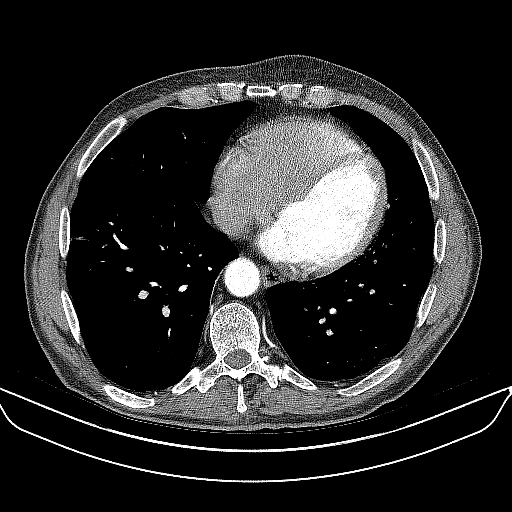
[im 77/173  soft-tissue]
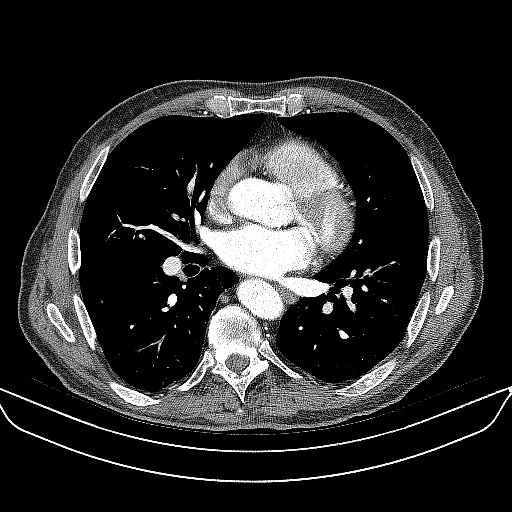
[im 96/173  soft-tissue]
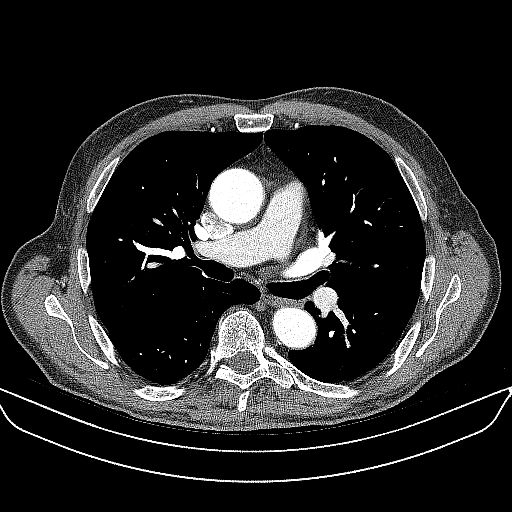
[im 115/173  soft-tissue]
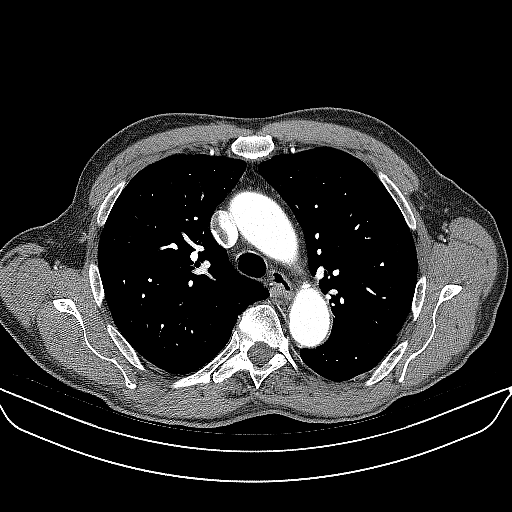

[17 of 32 positions shown; findings below may reference images not displayed]

FINDINGS: Vascular Findings:

Mild fusiform aneurysmal dilatation of the ascending thoracic aorta
with measurements as follows. The thoracic aorta tapers to a normal
caliber at the level of the aortic arch. There is a minimal amount
of atherosclerotic plaque involving the undersurface of the aortic
arch, not resulting in a hemodynamically significant stenosis. No
evidence of thoracic aortic dissection or periaortic stranding on
this nongated examination.

Conventional configuration of the aortic arch. The branch vessels of
the aortic arch appear widely patent throughout their imaged
courses.

The descending thoracic aorta is mildly tortuous but of normal
caliber.

Borderline cardiomegaly. Coronary artery calcifications. No
pericardial effusion, however there is a small amount of fluid
within the pericardial recess. Calcifications within the aortic
valve leaflets (representative coronal image 69, series 8).

-------------------------------------------------------------

Thoracic aortic measurements:

Sinotubular junction

34 mm as measured in greatest oblique short axis coronal dimension.

Proximal ascending aorta

42 mm as measured in greatest oblique short axis axial dimension at
the level of the main pulmonary artery (axial image 53, series 4)
and approximately 40 mm in greatest oblique short axis coronal
diameter (coronal image 61, series 8), unchanged compared to the
[DATE] examination

Aortic arch aorta

32 mm as measured in greatest oblique short axis sagittal dimension.

Proximal descending thoracic aorta

30 mm as measured in greatest oblique short axis axial dimension at
the level of the main pulmonary artery (image 53, series 4).

Distal descending thoracic aorta

20 mm as measured in greatest oblique short axis axial dimension at
the level of the diaphragmatic hiatus.

Review of the MIP images confirms the above findings.

-------------------------------------------------------------

Non-Vascular Findings:

Mediastinum/Lymph Nodes: Partially calcified left hilar lymph nodes,
the sequela of previous granulomatous infection. No bulky
mediastinal, hilar axillary lymph adenopathy.

Lungs/Pleura: Minimal bibasilar subsegmental atelectasis. No focal
airspace opacities. No pleural effusion or pneumothorax. The central
pulmonary airways appear widely patent. Punctate granulomas are
again seen within the left lower lobe (image 126, series 7), similar
to the [DATE] examination. No discrete worrisome pulmonary nodules.

Upper abdomen: Limited early arterial phase evaluation of the upper
abdomen demonstrates an approximately 5.1 x 4.1 cm partially
exophytic right-sided renal cyst as well as an approximately 2.0 cm
hypoattenuating left-sided renal lesion which is incompletely imaged
though also favored to represent a renal cyst. Note is made of a 2
small splenules about the anterior tip of the spleen. Small hiatal
hernia.

Musculoskeletal: Mild grossly symmetric bilateral gynecomastia.
Mild-to-moderate scoliotic curvature of the thoracolumbar spine.
Mild-to-moderate degenerative change the bilateral glenohumeral
joints with joint space loss, subchondral sclerosis and
osteophytosis. Moderate to severe multilevel DDD within the
visualized lower cervical spine, T3-T4 and T11-T12 intervertebral
disc spaces with disc space height loss, endplate irregularity and
sclerosis. Normal appearance of the thyroid gland.
IMPRESSION: 1. Stable mild fusiform aneurysmal dilatation of the ascending
thoracic aorta measuring 42 mm in diameter, unchanged compared to
the 1311 examination. Aortic aneurysm NOS (LAUFY-OTM.I).
2. Coronary calcifications.  Aortic Atherosclerosis (LAUFY-PCY.Y).
3. Borderline cardiomegaly with calcifications about the aortic
valve leaflets as could be seen in aortic valvular pathology.
Further evaluation cardiac echo could be performed as indicated.

## 2022-03-04 DIAGNOSIS — Z20822 Contact with and (suspected) exposure to covid-19: Secondary | ICD-10-CM | POA: Diagnosis not present

## 2022-03-05 ENCOUNTER — Other Ambulatory Visit: Payer: Self-pay | Admitting: Internal Medicine

## 2022-03-07 DIAGNOSIS — Z20822 Contact with and (suspected) exposure to covid-19: Secondary | ICD-10-CM | POA: Diagnosis not present

## 2022-03-08 ENCOUNTER — Other Ambulatory Visit: Payer: Self-pay | Admitting: Internal Medicine

## 2022-03-08 DIAGNOSIS — M810 Age-related osteoporosis without current pathological fracture: Secondary | ICD-10-CM

## 2022-03-08 DIAGNOSIS — G8929 Other chronic pain: Secondary | ICD-10-CM

## 2022-03-18 ENCOUNTER — Telehealth: Payer: Self-pay | Admitting: Internal Medicine

## 2022-03-18 NOTE — Telephone Encounter (Signed)
Patients wife called and said that she sent you a message on My Chart - Husband has an appointment tomorrow but there are things she wants to cover without him knowing.  Please read my chart message today. ?

## 2022-03-19 ENCOUNTER — Encounter: Payer: Self-pay | Admitting: Internal Medicine

## 2022-03-19 ENCOUNTER — Ambulatory Visit (INDEPENDENT_AMBULATORY_CARE_PROVIDER_SITE_OTHER): Payer: Medicare Other | Admitting: Internal Medicine

## 2022-03-19 VITALS — BP 112/70 | HR 98 | Temp 98.2°F | Ht 73.0 in | Wt 195.0 lb

## 2022-03-19 DIAGNOSIS — G8929 Other chronic pain: Secondary | ICD-10-CM

## 2022-03-19 DIAGNOSIS — M810 Age-related osteoporosis without current pathological fracture: Secondary | ICD-10-CM

## 2022-03-19 DIAGNOSIS — R739 Hyperglycemia, unspecified: Secondary | ICD-10-CM | POA: Diagnosis not present

## 2022-03-19 DIAGNOSIS — M170 Bilateral primary osteoarthritis of knee: Secondary | ICD-10-CM | POA: Diagnosis not present

## 2022-03-19 DIAGNOSIS — N200 Calculus of kidney: Secondary | ICD-10-CM | POA: Diagnosis not present

## 2022-03-19 DIAGNOSIS — M179 Osteoarthritis of knee, unspecified: Secondary | ICD-10-CM | POA: Insufficient documentation

## 2022-03-19 DIAGNOSIS — G629 Polyneuropathy, unspecified: Secondary | ICD-10-CM | POA: Diagnosis not present

## 2022-03-19 DIAGNOSIS — I2583 Coronary atherosclerosis due to lipid rich plaque: Secondary | ICD-10-CM

## 2022-03-19 DIAGNOSIS — R972 Elevated prostate specific antigen [PSA]: Secondary | ICD-10-CM | POA: Diagnosis not present

## 2022-03-19 DIAGNOSIS — M545 Low back pain, unspecified: Secondary | ICD-10-CM

## 2022-03-19 DIAGNOSIS — I2510 Atherosclerotic heart disease of native coronary artery without angina pectoris: Secondary | ICD-10-CM

## 2022-03-19 DIAGNOSIS — M25561 Pain in right knee: Secondary | ICD-10-CM

## 2022-03-19 DIAGNOSIS — M79604 Pain in right leg: Secondary | ICD-10-CM

## 2022-03-19 DIAGNOSIS — R350 Frequency of micturition: Secondary | ICD-10-CM

## 2022-03-19 DIAGNOSIS — N183 Chronic kidney disease, stage 3 unspecified: Secondary | ICD-10-CM | POA: Diagnosis not present

## 2022-03-19 DIAGNOSIS — M25562 Pain in left knee: Secondary | ICD-10-CM

## 2022-03-19 DIAGNOSIS — M79605 Pain in left leg: Secondary | ICD-10-CM

## 2022-03-19 DIAGNOSIS — E7800 Pure hypercholesterolemia, unspecified: Secondary | ICD-10-CM | POA: Diagnosis not present

## 2022-03-19 DIAGNOSIS — N401 Enlarged prostate with lower urinary tract symptoms: Secondary | ICD-10-CM

## 2022-03-19 LAB — COMPREHENSIVE METABOLIC PANEL
ALT: 15 U/L (ref 0–53)
AST: 19 U/L (ref 0–37)
Albumin: 4.2 g/dL (ref 3.5–5.2)
Alkaline Phosphatase: 37 U/L — ABNORMAL LOW (ref 39–117)
BUN: 18 mg/dL (ref 6–23)
CO2: 27 mEq/L (ref 19–32)
Calcium: 9 mg/dL (ref 8.4–10.5)
Chloride: 105 mEq/L (ref 96–112)
Creatinine, Ser: 1.31 mg/dL (ref 0.40–1.50)
GFR: 53.03 mL/min — ABNORMAL LOW (ref >60.00)
Glucose, Bld: 106 mg/dL — ABNORMAL HIGH (ref 70–99)
Potassium: 4.1 mEq/L (ref 3.5–5.1)
Sodium: 138 mEq/L (ref 135–145)
Total Bilirubin: 0.4 mg/dL (ref 0.2–1.2)
Total Protein: 7 g/dL (ref 6.0–8.3)

## 2022-03-19 LAB — LIPID PANEL
Chol/HDL Ratio: 2.2 ratio (ref 0.0–5.0)
Cholesterol, Total: 94 mg/dL — ABNORMAL LOW (ref 100–199)
HDL: 42 mg/dL (ref >39)
LDL Chol Calc (NIH): 39 mg/dL (ref 0–99)
Triglycerides: 55 mg/dL (ref 0–149)
VLDL Cholesterol Cal: 13 mg/dL (ref 5–40)

## 2022-03-19 LAB — HEPATIC FUNCTION PANEL
ALT: 14 IU/L (ref 0–44)
AST: 19 IU/L (ref 0–40)
Albumin: 4.1 g/dL (ref 3.7–4.7)
Alkaline Phosphatase: 38 IU/L — ABNORMAL LOW (ref 44–121)
Bilirubin Total: 0.3 mg/dL (ref 0.0–1.2)
Bilirubin, Direct: 0.13 mg/dL (ref 0.00–0.40)
Total Protein: 6.8 g/dL (ref 6.0–8.5)

## 2022-03-19 LAB — PSA: PSA: 32.27 ng/mL — ABNORMAL HIGH (ref 0.10–4.00)

## 2022-03-19 MED ORDER — DENOSUMAB 60 MG/ML ~~LOC~~ SOSY
60.0000 mg | PREFILLED_SYRINGE | Freq: Once | SUBCUTANEOUS | Status: AC
  Administered 2022-03-19: 60 mg via SUBCUTANEOUS

## 2022-03-19 NOTE — Assessment & Plan Note (Signed)
Check BPH ?

## 2022-03-19 NOTE — Assessment & Plan Note (Signed)
Dr Theda Sers ?B OA ?

## 2022-03-19 NOTE — Assessment & Plan Note (Signed)
In PT, massage  ?Blue-Emu cream was recommended to use 2-3 times a day ? ?

## 2022-03-19 NOTE — Progress Notes (Signed)
? ?Subjective:  ?Patient ID: Roger Kent, male    DOB: 08-30-1946  Age: 76 y.o. MRN: [id number] ? ?CC: No chief complaint on file. ? ? ?HPI ?[name] presents for HTN ? ?C/o B knee pain [name] has an appt at [facility] appt - (Dr Theda Sers referred for RFA in the knees). ? [name] rates his knee pain  at 2-2 0.5 out of 10 on average.  He has been working, walking and lifting a lot.  Previously Dr. Theda Sers recommended total knee replacement.  He is getting injections twice a year.  Jd has an appt at [facility] appt - (Dr Theda Sers referred for RFA in the knees). ?We discussed options with [name] and his wife [name]. ? ?F/u LBP, BPH ? ?C/o B LE pain ? ? ?Outpatient Medications Prior to Visit  ?Medication Sig Dispense Refill  ? aspirin 325 MG tablet aspirin 325 mg tablet ? Take 1 tablet every day by oral route.    ? cholecalciferol (VITAMIN D) 1000 UNITS tablet Take 1,000 Units by mouth every morning.    ? Cyanocobalamin (VITAMIN B 12 PO) Take by mouth.    ? EPINEPHrine (EPIPEN 2-PAK) 0.3 mg/0.3 mL IJ SOAJ injection Inject 0.3 mLs (0.3 mg total) into the muscle as needed for anaphylaxis. 1 Device 3  ? glucosamine-chondroitin 500-400 MG tablet Take 2 tablets by mouth every morning.     ? Lactobacillus (CVS ACIDOPHILUS PO) Take by mouth daily.    ? Multiple Vitamin (MULTIVITAMIN WITH MINERALS) TABS tablet Take 1 tablet by mouth every morning.    ? olmesartan (BENICAR) 40 MG tablet TAKE 1 TABLET DAILY 90 tablet 3  ? olopatadine (PATANOL) 0.1 % ophthalmic solution Place 1 drop into both eyes daily at 6 (six) AM.    ? omeprazole-sodium bicarbonate (ZEGERID) 40-1100 MG per capsule Take 1 capsule by mouth daily before breakfast.    ? OVER THE COUNTER MEDICATION Take 3 capsules by mouth every morning. OTC herbal supplement for prostate health    ? REPATHA SURECLICK 856 MG/ML SOAJ INJECT ONE PEN ('140MG'$ ) INTO THE SKIN EVERY 14 DAYS 6 mL 2  ? ?No facility-administered medications prior to visit.   ? ? ?ROS: ?Review of Systems  ?Constitutional:  Negative for appetite change, fatigue and unexpected weight change.  ?HENT:  Negative for congestion, nosebleeds, sneezing, sore throat and trouble swallowing.   ?Eyes:  Negative for itching and visual disturbance.  ?Respiratory:  Negative for cough.   ?Cardiovascular:  Negative for chest pain, palpitations and leg swelling.  ?Gastrointestinal:  Negative for abdominal distention, blood in stool, diarrhea and nausea.  ?Genitourinary:  Negative for frequency and hematuria.  ?Musculoskeletal:  Positive for arthralgias, back pain and gait problem. Negative for joint swelling and neck pain.  ?Skin:  Negative for rash.  ?Neurological:  Negative for dizziness, tremors, speech difficulty and weakness.  ?Psychiatric/Behavioral:  Negative for agitation, dysphoric mood, sleep disturbance and suicidal ideas. The patient is not nervous/anxious.   ? ?Objective:  ?BP 112/70 (BP Location: Left Arm, Patient Position: Sitting, Cuff Size: Normal)   Pulse 98   Temp 98.2 ?F (36.8 ?C) (Oral)   Ht '6\' 1"'$  (1.854 m)   Wt 195 lb (88.5 kg)   SpO2 97%   BMI 25.73 kg/m?  ? ?BP Readings from Last 3 Encounters:  ?03/19/22 112/70  ?01/10/22 138/74  ?10/29/21 (!) 141/82  ? ? ?Wt Readings from Last 3 Encounters:  ?03/19/22 195 lb (88.5 kg)  ?01/10/22 196 lb 6.4 oz (89.1  kg)  ?10/29/21 199 lb (90.3 kg)  ? ? ?Physical Exam ?Constitutional:   ?   General: He is not in acute distress. ?   Appearance: Normal appearance. He is well-developed.  ?   Comments: NAD  ?Eyes:  ?   Conjunctiva/sclera: Conjunctivae normal.  ?   Pupils: Pupils are equal, round, and reactive to light.  ?Neck:  ?   Thyroid: No thyromegaly.  ?   Vascular: No JVD.  ?Cardiovascular:  ?   Rate and Rhythm: Normal rate and regular rhythm.  ?   Heart sounds: Normal heart sounds. No murmur heard. ?  No friction rub. No gallop.  ?Pulmonary:  ?   Effort: Pulmonary effort is normal. No respiratory distress.  ?   Breath sounds: Normal breath  sounds. No wheezing or rales.  ?Chest:  ?   Chest wall: No tenderness.  ?Abdominal:  ?   General: Bowel sounds are normal. There is no distension.  ?   Palpations: Abdomen is soft. There is no mass.  ?   Tenderness: There is no abdominal tenderness. There is no guarding or rebound.  ?Musculoskeletal:     ?   General: Tenderness present. Normal range of motion.  ?   Cervical back: Normal range of motion.  ?Lymphadenopathy:  ?   Cervical: No cervical adenopathy.  ?Skin: ?   General: Skin is warm and dry.  ?Neurological:  ?   Mental Status: He is alert and oriented to person, place, and time.  ?   Cranial Nerves: No cranial nerve deficit.  ?   Motor: No abnormal muscle tone.  ?   Coordination: Coordination normal.  ?   Gait: Gait abnormal.  ?   Deep Tendon Reflexes: Reflexes are normal and symmetric.  ?Psychiatric:     ?   Behavior: Behavior normal.     ?   Thought Content: Thought content normal.     ?   Judgment: Judgment normal.  ?[name] looks well.  His gait is slightly antalgic. ? ? ? A total time of 45 minutes was spent preparing to see the patient, reviewing tests, x-rays, operative reports and other medical records.  Also, obtaining history and performing comprehensive physical exam.  Additionally, counseling the patient regarding the above listed issues -arthritis, pain control, treatment options.   Finally, documenting clinical information in the health records, coordination of care, educating the patient and his wife [name] regarding osteoporosis, osteoarthritis, quality of life.  ? ? ?Lab Results  ?Component Value Date  ? WBC 9.1 10/29/2021  ? HGB 14.4 10/29/2021  ? HCT 43.5 10/29/2021  ? PLT 239 10/29/2021  ? GLUCOSE 106 (H) 03/19/2022  ? CHOL 94 (L) 03/19/2022  ? TRIG 55 03/19/2022  ? HDL 42 03/19/2022  ? [place] 39 03/19/2022  ? ALT 15 03/19/2022  ? AST 19 03/19/2022  ? NA 138 03/19/2022  ? K 4.1 03/19/2022  ? CL 105 03/19/2022  ? CREATININE 1.31 03/19/2022  ? BUN 18 03/19/2022  ? CO2 27 03/19/2022  ? TSH  2.06 08/01/2020  ? PSA 32.27 (H) 03/19/2022  ? INR 2.4 RATIO (H) 09/17/2007  ? HGBA1C 5.8 12/29/2018  ? ? ?CT ANGIO CHEST AORTA W/CM & OR WO/CM ? ?Result Date: 05/14/2021 ?CLINICAL DATA:  Thoracic aortic aneurysm follow-up EXAM: CT ANGIOGRAPHY CHEST WITH CONTRAST TECHNIQUE: Multidetector CT imaging of the chest was performed using the standard protocol during bolus administration of intravenous contrast. Multiplanar CT image reconstructions and MIPs were obtained to evaluate the vascular  anatomy. CONTRAST:  79m ISOVUE-370 IOPAMIDOL (ISOVUE-370) INJECTION 76% COMPARISON:  None. FINDINGS: Cardiovascular: Heart size is within normal limits. Coronary calcifications are noted. Evaluation of the aortic root and ascending thoracic aorta is limited due to motion. Maximum diameter of the ascending thoracic aorta measures 4.1 cm which is not significantly changed since the prior exam. No significant dilatation of the aortic arch or descending thoracic aorta. Mediastinum/Nodes: No enlarged mediastinal, hilar, or axillary lymph nodes. Thyroid gland, trachea, and esophagus demonstrate no significant findings. Calcified grade lymph nodes in the left hilum are consistent with prior granulomatous inflammation. Lungs/Pleura: Scattered linear opacities at the lung bases likely due to atelectasis or scarring. Calcified granuloma noted in the left lower lobe. Lungs otherwise clear. Upper Abdomen: Punctate calcifications in the spleen likely due to granulomatous disease. Simple bilateral renal cysts again seen. Descending colon diverticula are seen. Musculoskeletal: Dextroconvex scoliosis of the thoracic spine. Review of the MIP images confirms the above findings. IMPRESSION: Unchanged ascending aortic aneurysm measuring up to 4.1 cm compared to 4.2 cm on prior exam from 05/09/2020. Aortic aneurysm NOS (ICD10-I71.9). Electronically Signed   By: FMiachel RouxM.D.   On: 05/14/2021 09:01  ? ? ?Assessment & Plan:  ? ?Problem List Items  Addressed This Visit   ? ? BPH (benign prostatic hyperplasia)  ?  Check BPH ? ?  ?  ? Relevant Orders  ? PSA (Completed)  ? Comprehensive metabolic panel (Completed)  ? Osteoporosis - Primary  ?  Vit D, strength training ?

## 2022-03-19 NOTE — Assessment & Plan Note (Signed)
Vit D, strength training ?Vit D ?

## 2022-03-19 NOTE — Patient Instructions (Signed)
Blue-Emu cream -- use 2-3 times a day ? ?

## 2022-03-19 NOTE — Assessment & Plan Note (Signed)
Check A1c. 

## 2022-03-20 ENCOUNTER — Telehealth: Payer: Self-pay | Admitting: Internal Medicine

## 2022-03-20 DIAGNOSIS — N183 Chronic kidney disease, stage 3 unspecified: Secondary | ICD-10-CM | POA: Insufficient documentation

## 2022-03-20 DIAGNOSIS — M79604 Pain in right leg: Secondary | ICD-10-CM

## 2022-03-20 NOTE — Assessment & Plan Note (Signed)
?  Urology referral for follow-up ?

## 2022-03-20 NOTE — Telephone Encounter (Signed)
Roger Kent calls today with [facility] [place] regarding PT's order. They have received a request for VAS [place] LE A duplex and patient needs to have a ABI w/wo TBI prior to the order that is in  VAS# is [id number] . If you have any questions you can reach [place] at ? ?CB: [phone] ?

## 2022-03-20 NOTE — Assessment & Plan Note (Signed)
PSA is elevated at 32.27.  It was in the 8 range before ?Urology referral ?

## 2022-03-20 NOTE — Assessment & Plan Note (Signed)
GFR 58 ?Improved fluid intake ?

## 2022-03-20 NOTE — Assessment & Plan Note (Signed)
Chronic pain.  Eben rates it at 2-2 0.5 out of 10 on average.  He has been working, walking and lifting a lot.  Previously Dr. Theda Sers recommended total knee replacement.  He is getting injections twice a year.  Zaelyn has an appt at [facility] appt - (Dr Theda Sers referred for RFA in the knees). ?We discussed options with Eeshan and his wife [name]. ?Blue-Emu cream was recommended to use 2-3 times a day ? ?

## 2022-03-20 NOTE — Assessment & Plan Note (Signed)
Bilateral leg pain.  Likely related to arthritis, neuropathy, overuse. ?Obtain arterial Doppler ultrasound. ?

## 2022-03-21 ENCOUNTER — Other Ambulatory Visit: Payer: Self-pay | Admitting: Internal Medicine

## 2022-03-21 DIAGNOSIS — R972 Elevated prostate specific antigen [PSA]: Secondary | ICD-10-CM

## 2022-03-21 NOTE — Telephone Encounter (Signed)
Prolia VOB initiated via [email or web address] ? ?Last Prolia inj 10/25/21 ?Next Prolia inj due 04/26/22 ? ?

## 2022-03-21 NOTE — Telephone Encounter (Signed)
Will redo the order.  Thank you ?

## 2022-03-21 NOTE — Progress Notes (Signed)
Urology referral

## 2022-03-23 DIAGNOSIS — Z20822 Contact with and (suspected) exposure to covid-19: Secondary | ICD-10-CM | POA: Diagnosis not present

## 2022-03-25 DIAGNOSIS — M25561 Pain in right knee: Secondary | ICD-10-CM | POA: Diagnosis not present

## 2022-03-25 DIAGNOSIS — M25562 Pain in left knee: Secondary | ICD-10-CM | POA: Diagnosis not present

## 2022-03-25 DIAGNOSIS — G8929 Other chronic pain: Secondary | ICD-10-CM | POA: Diagnosis not present

## 2022-03-25 DIAGNOSIS — M170 Bilateral primary osteoarthritis of knee: Secondary | ICD-10-CM | POA: Diagnosis not present

## 2022-03-28 ENCOUNTER — Ambulatory Visit: Payer: Medicare Other | Admitting: Rheumatology
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
## 2022-04-01 ENCOUNTER — Encounter: Payer: Self-pay | Admitting: Cardiology

## 2022-04-01 DIAGNOSIS — R972 Elevated prostate specific antigen [PSA]: Secondary | ICD-10-CM | POA: Diagnosis not present

## 2022-04-02 ENCOUNTER — Other Ambulatory Visit: Payer: Self-pay | Admitting: Urology

## 2022-04-02 DIAGNOSIS — R972 Elevated prostate specific antigen [PSA]: Secondary | ICD-10-CM

## 2022-04-03 ENCOUNTER — Encounter: Payer: Self-pay | Admitting: Internal Medicine
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
## 2022-04-04 ENCOUNTER — Encounter (HOSPITAL_COMMUNITY): Payer: Self-pay

## 2022-04-04 ENCOUNTER — Ambulatory Visit (HOSPITAL_COMMUNITY)
Admission: RE | Admit: 2022-04-04 | Discharge: 2022-04-04 | Disposition: A | Discharge status: Home / Self Care | Payer: Medicare Other | Source: Ambulatory Visit | Attending: Internal Medicine | Admitting: Internal Medicine

## 2022-04-04 ENCOUNTER — Ambulatory Visit (INDEPENDENT_AMBULATORY_CARE_PROVIDER_SITE_OTHER)
Admission: RE | Admit: 2022-04-04 | Discharge: 2022-04-04 | Disposition: A | Discharge status: Home / Self Care | Payer: Medicare Other | Source: Ambulatory Visit | Attending: Internal Medicine | Admitting: Internal Medicine

## 2022-04-04 DIAGNOSIS — M79604 Pain in right leg: Secondary | ICD-10-CM

## 2022-04-04 DIAGNOSIS — M79605 Pain in left leg: Secondary | ICD-10-CM

## 2022-04-06 NOTE — Telephone Encounter (Signed)
Pt ready for scheduling on or after 04/26/22  Out-of-pocket cost due at time of visit: $0  Primary: Medicare Prolia co-insurance: 20% (approximately $276) Admin fee co-insurance: 20% (approximately $25)  Secondary: BCBS FEP Prolia co-insurance: will coordinate benefits and will consider 100% of remaining expenses. Admin fee co-insurance: will coordinate benefits and will consider 100% of remaining expenses.  Deductible: $226 of $226 met  Prior Auth: not required PA# Valid:   ** This summary of benefits is an estimation of the patient's out-of-pocket cost. Exact cost may vary based on individual plan coverage.

## 2022-04-12 ENCOUNTER — Other Ambulatory Visit: Payer: Self-pay | Admitting: Internal Medicine

## 2022-04-12 DIAGNOSIS — M170 Bilateral primary osteoarthritis of knee: Secondary | ICD-10-CM | POA: Diagnosis not present

## 2022-04-12 DIAGNOSIS — G8929 Other chronic pain: Secondary | ICD-10-CM | POA: Diagnosis not present

## 2022-04-12 DIAGNOSIS — M25561 Pain in right knee: Secondary | ICD-10-CM | POA: Diagnosis not present

## 2022-04-12 DIAGNOSIS — M25562 Pain in left knee: Secondary | ICD-10-CM | POA: Diagnosis not present

## 2022-04-16 ENCOUNTER — Ambulatory Visit (INDEPENDENT_AMBULATORY_CARE_PROVIDER_SITE_OTHER): Payer: Medicare Other | Admitting: Rheumatology

## 2022-04-16 ENCOUNTER — Encounter: Payer: Self-pay | Admitting: Rheumatology

## 2022-04-16 ENCOUNTER — Other Ambulatory Visit: Payer: Self-pay | Admitting: *Deleted

## 2022-04-16 ENCOUNTER — Ambulatory Visit (INDEPENDENT_AMBULATORY_CARE_PROVIDER_SITE_OTHER): Payer: Medicare Other

## 2022-04-16 VITALS — BP 132/79 | HR 76 | Ht 73.0 in | Wt 194.4 lb

## 2022-04-16 DIAGNOSIS — Z8719 Personal history of other diseases of the digestive system: Secondary | ICD-10-CM

## 2022-04-16 DIAGNOSIS — M19041 Primary osteoarthritis, right hand: Secondary | ICD-10-CM | POA: Diagnosis not present

## 2022-04-16 DIAGNOSIS — R350 Frequency of micturition: Secondary | ICD-10-CM

## 2022-04-16 DIAGNOSIS — K2270 Barrett's esophagus without dysplasia: Secondary | ICD-10-CM

## 2022-04-16 DIAGNOSIS — Z86718 Personal history of other venous thrombosis and embolism: Secondary | ICD-10-CM | POA: Diagnosis not present

## 2022-04-16 DIAGNOSIS — M25562 Pain in left knee: Secondary | ICD-10-CM | POA: Diagnosis not present

## 2022-04-16 DIAGNOSIS — Z8619 Personal history of other infectious and parasitic diseases: Secondary | ICD-10-CM

## 2022-04-16 DIAGNOSIS — Q210 Ventricular septal defect: Secondary | ICD-10-CM

## 2022-04-16 DIAGNOSIS — M19072 Primary osteoarthritis, left ankle and foot: Secondary | ICD-10-CM

## 2022-04-16 DIAGNOSIS — N200 Calculus of kidney: Secondary | ICD-10-CM

## 2022-04-16 DIAGNOSIS — M5136 Other intervertebral disc degeneration, lumbar region: Secondary | ICD-10-CM | POA: Diagnosis not present

## 2022-04-16 DIAGNOSIS — I2510 Atherosclerotic heart disease of native coronary artery without angina pectoris: Secondary | ICD-10-CM

## 2022-04-16 DIAGNOSIS — M19042 Primary osteoarthritis, left hand: Secondary | ICD-10-CM

## 2022-04-16 DIAGNOSIS — R5383 Other fatigue: Secondary | ICD-10-CM

## 2022-04-16 DIAGNOSIS — M810 Age-related osteoporosis without current pathological fracture: Secondary | ICD-10-CM | POA: Diagnosis not present

## 2022-04-16 DIAGNOSIS — M19071 Primary osteoarthritis, right ankle and foot: Secondary | ICD-10-CM

## 2022-04-16 DIAGNOSIS — E785 Hyperlipidemia, unspecified: Secondary | ICD-10-CM

## 2022-04-16 DIAGNOSIS — M51369 Other intervertebral disc degeneration, lumbar region without mention of lumbar back pain or lower extremity pain: Secondary | ICD-10-CM

## 2022-04-16 DIAGNOSIS — M4156 Other secondary scoliosis, lumbar region: Secondary | ICD-10-CM | POA: Diagnosis not present

## 2022-04-16 DIAGNOSIS — G8929 Other chronic pain: Secondary | ICD-10-CM | POA: Diagnosis not present

## 2022-04-16 DIAGNOSIS — N401 Enlarged prostate with lower urinary tract symptoms: Secondary | ICD-10-CM

## 2022-04-16 DIAGNOSIS — I712 Thoracic aortic aneurysm, without rupture, unspecified: Secondary | ICD-10-CM

## 2022-04-16 DIAGNOSIS — G629 Polyneuropathy, unspecified: Secondary | ICD-10-CM

## 2022-04-16 DIAGNOSIS — I10 Essential (primary) hypertension: Secondary | ICD-10-CM

## 2022-04-16 DIAGNOSIS — I2583 Coronary atherosclerosis due to lipid rich plaque: Secondary | ICD-10-CM

## 2022-04-16 DIAGNOSIS — N183 Chronic kidney disease, stage 3 unspecified: Secondary | ICD-10-CM

## 2022-04-16 DIAGNOSIS — M25561 Pain in right knee: Secondary | ICD-10-CM | POA: Diagnosis not present

## 2022-04-18 ENCOUNTER — Other Ambulatory Visit: Payer: Self-pay | Admitting: Internal Medicine

## 2022-04-18 DIAGNOSIS — Z1211 Encounter for screening for malignant neoplasm of colon: Secondary | ICD-10-CM

## 2022-04-18 LAB — PROTEIN ELECTROPHORESIS, SERUM, WITH REFLEX
Albumin ELP: 4.1 g/dL (ref 3.8–4.8)
Alpha 1: 0.3 g/dL (ref 0.2–0.3)
Alpha 2: 0.6 g/dL (ref 0.5–0.9)
Beta 2: 0.4 g/dL (ref 0.2–0.5)
Beta Globulin: 0.5 g/dL (ref 0.4–0.6)
Gamma Globulin: 1.3 g/dL (ref 0.8–1.7)
Total Protein: 7.2 g/dL (ref 6.1–8.1)

## 2022-04-18 LAB — RHEUMATOID FACTOR: Rheumatoid fact SerPl-aCnc: <14 IU/mL (ref <14)

## 2022-04-18 LAB — TSH: TSH: 3.03 mIU/L (ref 0.40–4.50)

## 2022-04-18 LAB — VITAMIN D 25 HYDROXY (VIT D DEFICIENCY, FRACTURES): Vit D, 25-Hydroxy: 58 ng/mL (ref 30–100)

## 2022-04-18 LAB — URIC ACID: Uric Acid, Serum: 5.4 mg/dL (ref 4.0–8.0)

## 2022-04-18 LAB — CYCLIC CITRUL PEPTIDE ANTIBODY, IGG: Cyclic Citrullin Peptide Ab: <16 UNITS

## 2022-04-18 LAB — SEDIMENTATION RATE: Sed Rate: 9 mm/h (ref 0–20)

## 2022-04-18 NOTE — Progress Notes (Signed)
All the labs are within normal limits.  I will discuss results at the follow-up visit.

## 2022-04-20 ENCOUNTER — Ambulatory Visit
Admission: RE | Admit: 2022-04-20 | Discharge: 2022-04-20 | Disposition: A | Discharge status: Home / Self Care | Payer: Medicare Other | Source: Ambulatory Visit | Attending: Urology | Admitting: Urology

## 2022-04-20 DIAGNOSIS — R972 Elevated prostate specific antigen [PSA]: Secondary | ICD-10-CM

## 2022-04-20 DIAGNOSIS — N400 Enlarged prostate without lower urinary tract symptoms: Secondary | ICD-10-CM | POA: Diagnosis not present

## 2022-04-20 DIAGNOSIS — R590 Localized enlarged lymph nodes: Secondary | ICD-10-CM | POA: Diagnosis not present

## 2022-04-20 DIAGNOSIS — N320 Bladder-neck obstruction: Secondary | ICD-10-CM | POA: Diagnosis not present

## 2022-04-20 MED ORDER — GADOBENATE DIMEGLUMINE 529 MG/ML IV SOLN
18.0000 mL | Freq: Once | INTRAVENOUS | Status: AC | PRN
  Administered 2022-04-20: 18 mL via INTRAVENOUS

## 2022-04-23 DIAGNOSIS — R972 Elevated prostate specific antigen [PSA]: Secondary | ICD-10-CM | POA: Diagnosis not present
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
## 2022-04-30 ENCOUNTER — Other Ambulatory Visit: Payer: Self-pay | Admitting: Oncology

## 2022-04-30 ENCOUNTER — Encounter: Payer: Self-pay | Admitting: Hematology & Oncology

## 2022-04-30 ENCOUNTER — Inpatient Hospital Stay: Payer: Medicare Other | Attending: Hematology & Oncology

## 2022-04-30 ENCOUNTER — Inpatient Hospital Stay (HOSPITAL_BASED_OUTPATIENT_CLINIC_OR_DEPARTMENT_OTHER): Payer: Medicare Other | Admitting: Hematology & Oncology

## 2022-04-30 ENCOUNTER — Telehealth: Payer: Self-pay | Admitting: *Deleted

## 2022-04-30 ENCOUNTER — Encounter: Payer: Self-pay | Admitting: Internal Medicine

## 2022-04-30 ENCOUNTER — Other Ambulatory Visit: Payer: Self-pay | Admitting: Lab

## 2022-04-30 ENCOUNTER — Inpatient Hospital Stay: Payer: Medicare Other

## 2022-04-30 VITALS — BP 139/82 | HR 73 | Temp 97.6°F | Resp 18 | Ht 73.0 in | Wt 193.5 lb

## 2022-04-30 DIAGNOSIS — I82502 Chronic embolism and thrombosis of unspecified deep veins of left lower extremity: Secondary | ICD-10-CM | POA: Insufficient documentation

## 2022-04-30 DIAGNOSIS — I82509 Chronic embolism and thrombosis of unspecified deep veins of unspecified lower extremity: Secondary | ICD-10-CM

## 2022-04-30 DIAGNOSIS — D6862 Lupus anticoagulant syndrome: Secondary | ICD-10-CM | POA: Diagnosis not present

## 2022-04-30 DIAGNOSIS — D509 Iron deficiency anemia, unspecified: Secondary | ICD-10-CM | POA: Insufficient documentation

## 2022-04-30 DIAGNOSIS — D510 Vitamin B12 deficiency anemia due to intrinsic factor deficiency: Secondary | ICD-10-CM

## 2022-04-30 DIAGNOSIS — Z7982 Long term (current) use of aspirin: Secondary | ICD-10-CM | POA: Diagnosis not present

## 2022-04-30 DIAGNOSIS — I82451 Acute embolism and thrombosis of right peroneal vein: Secondary | ICD-10-CM | POA: Insufficient documentation

## 2022-04-30 DIAGNOSIS — Z7901 Long term (current) use of anticoagulants: Secondary | ICD-10-CM | POA: Diagnosis not present

## 2022-04-30 DIAGNOSIS — D500 Iron deficiency anemia secondary to blood loss (chronic): Secondary | ICD-10-CM | POA: Diagnosis not present

## 2022-04-30 DIAGNOSIS — K909 Intestinal malabsorption, unspecified: Secondary | ICD-10-CM | POA: Diagnosis not present

## 2022-04-30 DIAGNOSIS — I471 Supraventricular tachycardia: Secondary | ICD-10-CM | POA: Diagnosis not present

## 2022-04-30 HISTORY — DX: Iron deficiency anemia, unspecified: D50.9

## 2022-04-30 HISTORY — DX: Intestinal malabsorption, unspecified: K90.9

## 2022-04-30 LAB — CMP (CANCER CENTER ONLY)
ALT: 11 U/L (ref 0–44)
AST: 16 U/L (ref 15–41)
Albumin: 4.4 g/dL (ref 3.5–5.0)
Alkaline Phosphatase: 34 U/L — ABNORMAL LOW (ref 38–126)
Anion gap: 7 (ref 5–15)
BUN: 17 mg/dL (ref 8–23)
CO2: 27 mmol/L (ref 22–32)
Calcium: 9.4 mg/dL (ref 8.9–10.3)
Chloride: 108 mmol/L (ref 98–111)
Creatinine: 1.07 mg/dL (ref 0.61–1.24)
GFR, Estimated: >60 mL/min (ref >60)
Glucose, Bld: 87 mg/dL (ref 70–99)
Potassium: 4.1 mmol/L (ref 3.5–5.1)
Sodium: 142 mmol/L (ref 135–145)
Total Bilirubin: 0.5 mg/dL (ref 0.3–1.2)
Total Protein: 7.1 g/dL (ref 6.5–8.1)

## 2022-04-30 LAB — CBC WITH DIFFERENTIAL (CANCER CENTER ONLY)
Abs Immature Granulocytes: 0.02 10*3/uL (ref 0.00–0.07)
Basophils Absolute: 0 10*3/uL (ref 0.0–0.1)
Basophils Relative: 1 %
Eosinophils Absolute: 0.3 10*3/uL (ref 0.0–0.5)
Eosinophils Relative: 4 %
HCT: 29.9 % — ABNORMAL LOW (ref 39.0–52.0)
Hemoglobin: 9.1 g/dL — ABNORMAL LOW (ref 13.0–17.0)
Immature Granulocytes: 0 %
Lymphocytes Relative: 31 %
Lymphs Abs: 2.1 10*3/uL (ref 0.7–4.0)
MCH: 25.5 pg — ABNORMAL LOW (ref 26.0–34.0)
MCHC: 30.4 g/dL (ref 30.0–36.0)
MCV: 83.8 fL (ref 80.0–100.0)
Monocytes Absolute: 0.8 10*3/uL (ref 0.1–1.0)
Monocytes Relative: 11 %
Neutro Abs: 3.6 10*3/uL (ref 1.7–7.7)
Neutrophils Relative %: 53 %
Platelet Count: 377 10*3/uL (ref 150–400)
RBC: 3.57 MIL/uL — ABNORMAL LOW (ref 4.22–5.81)
RDW: 18.5 % — ABNORMAL HIGH (ref 11.5–15.5)
WBC Count: 6.7 10*3/uL (ref 4.0–10.5)
nRBC: 0 % (ref 0.0–0.2)

## 2022-04-30 LAB — FERRITIN: Ferritin: 7 ng/mL — ABNORMAL LOW (ref 24–336)

## 2022-04-30 LAB — RETICULOCYTES
Immature Retic Fract: 16.6 % — ABNORMAL HIGH (ref 2.3–15.9)
RBC.: 3.45 MIL/uL — ABNORMAL LOW (ref 4.22–5.81)
Retic Count, Absolute: 51 10*3/uL (ref 19.0–186.0)
Retic Ct Pct: 1.5 % (ref 0.4–3.1)

## 2022-04-30 LAB — IRON AND IRON BINDING CAPACITY (CC-WL,HP ONLY)
Iron: 11 ug/dL — ABNORMAL LOW (ref 45–182)
Saturation Ratios: 3 % — ABNORMAL LOW (ref 17.9–39.5)
TIBC: 448 ug/dL (ref 250–450)
UIBC: 437 ug/dL — ABNORMAL HIGH (ref 117–376)

## 2022-04-30 LAB — VITAMIN B12: Vitamin B-12: 290 pg/mL (ref 180–914)

## 2022-04-30 NOTE — Progress Notes (Addendum)
Hematology and Oncology Follow Up Visit  Roger Kent [id number] Jun 04, 1946 76 y.o. 04/30/2022   Principle Diagnosis:  DVT of the right peroneal vein SVT of the right saphenous vein (+) lupus anti-coagulant Iron deficiency anemia  Current Therapy:   Xarelto 20 mg po q day -- 1 yr of therapeutic to complete in 05/2019 Xarelto 10 mg po q day -- maintenance x 62yr-- start 05/2019 -- completed on 06/2020 EC ASA 81 mg po q day -- started on 01/11/2019 -- changed to 325 mg po q day on 06/05/2020 IV iron-Monoferric given on 05/03/2022     Interim History:  Roger Kent back for follow-up.  We saw him 6 months ago.  Since then, has been doing pretty well.  He is on aspirin.  He is on full dose aspirin.  He has had no problems with abdominal pain.  There is been no change in bowel or bladder habits.  He has had no cough.  He had no chest wall pain.  He has had no leg swelling.  He is still having his knee problems.  I think he is going to an orthopedic surgeon.  He apparently is having some type of ablative procedure to try to help with the knee pain.  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  He says this can be a somewhat lower productive year.  He has had no fever.  He has had no problems with COVID.  There is been no obvious change in bowel or bladder habits.  I think he says he has a colonoscopy coming up.  Overall, I would say his performance status is probably ECOG 1.   Medications:  Current Outpatient Medications:    aspirin 325 MG tablet, aspirin 325 mg tablet  Take 1 tablet every day by oral route., Disp: , Rfl:    cholecalciferol (VITAMIN D) 1000 UNITS tablet, Take 1,000 Units by mouth every morning., Disp: , Rfl:    Cyanocobalamin (VITAMIN B 12 PO), Take by mouth., Disp: , Rfl:    EPINEPHrine (EPIPEN 2-PAK) 0.3 mg/0.3 mL IJ SOAJ injection, Inject 0.3 mLs (0.3 mg total) into the muscle as needed for anaphylaxis., Disp: 1 Device, Rfl: 3    glucosamine-chondroitin 500-400 MG tablet, Take 2 tablets by mouth every morning. , Disp: , Rfl:    Lactobacillus ([employer] ACIDOPHILUS PO), Take by mouth daily., Disp: , Rfl:    Multiple Vitamin (MULTIVITAMIN WITH MINERALS) TABS tablet, Take 1 tablet by mouth every morning., Disp: , Rfl:    olmesartan (BENICAR) 40 MG tablet, TAKE 1 TABLET DAILY, Disp: 90 tablet, Rfl: 3   olopatadine (PATANOL) 0.1 % ophthalmic solution, Place 1 drop into both eyes daily at 6 (six) AM., Disp: , Rfl:    omeprazole-sodium bicarbonate (ZEGERID) 40-1100 MG per capsule, Take 1 capsule by mouth daily before breakfast., Disp: , Rfl:    OVER THE COUNTER MEDICATION, Take 3 capsules by mouth every morning. OTC herbal supplement for prostate health, Disp: , Rfl:    REPATHA SURECLICK 1480MG/ML SOAJ, INJECT ONE PEN ('140MG'$ ) INTO THE SKIN EVERY 14 DAYS, Disp: 6 mL, Rfl: 2  Allergies:  Allergies  Allergen Reactions   Oxycodone-Acetaminophen Other (See Comments)    Other Reaction: agitation   Crestor [Rosuvastatin]    Zetia [Ezetimibe]     Brain fog, felt sluggish   Lamisil [Terbinafine] Rash    Past Medical History, Surgical history, Social history, and Family History were reviewed and  updated.  Review of Systems: Review of Systems  Constitutional: Negative.   HENT:  Negative.    Eyes: Negative.   Respiratory: Negative.    Cardiovascular: Negative.   Gastrointestinal: Negative.   Endocrine: Negative.   Genitourinary: Negative.    Musculoskeletal: Negative.   Skin: Negative.   Neurological: Negative.   Hematological: Negative.   Psychiatric/Behavioral: Negative.      Physical Exam:  height is '6\' 1"'$  (1.854 m) and weight is 193 lb 8 oz (87.8 kg). His oral temperature is 97.6 F (36.4 C). His blood pressure is 139/82 and his pulse is 73. His respiration is 18 and oxygen saturation is 99%.   Wt Readings from Last 3 Encounters:  04/30/22 193 lb 8 oz (87.8 kg)  04/16/22 194 lb 6.4 oz (88.2 kg)  03/19/22 195 lb (88.5  kg)    Physical Exam Vitals reviewed.  HENT:     Head: Normocephalic and atraumatic.  Eyes:     Pupils: Pupils are equal, round, and reactive to light.  Cardiovascular:     Rate and Rhythm: Normal rate and regular rhythm.     Heart sounds: Normal heart sounds.  Pulmonary:     Effort: Pulmonary effort is normal.     Breath sounds: Normal breath sounds.  Abdominal:     General: Bowel sounds are normal.     Palpations: Abdomen is soft.  Genitourinary:    Comments: Rectal exam showed no external hemorrhoids.  He had no mass in the rectal vault.  His stool was brown and heme negative. Musculoskeletal:        General: No tenderness or deformity. Normal range of motion.     Cervical back: Normal range of motion.  Lymphadenopathy:     Cervical: No cervical adenopathy.  Skin:    General: Skin is warm and dry.     Findings: No erythema or rash.  Neurological:     Mental Status: He is alert and oriented to person, place, and time.  Psychiatric:        Behavior: Behavior normal.        Thought Content: Thought content normal.        Judgment: Judgment normal.      Lab Results  Component Value Date   WBC 6.7 04/30/2022   HGB 9.1 (L) 04/30/2022   HCT 29.9 (L) 04/30/2022   MCV 83.8 04/30/2022   PLT 377 04/30/2022     Chemistry      Component Value Date/Time   NA 142 04/30/2022 0812   NA 140 05/10/2021 0842   K 4.1 04/30/2022 0812   CL 108 04/30/2022 0812   CO2 27 04/30/2022 0812   BUN 17 04/30/2022 0812   BUN 15 05/10/2021 0842   CREATININE 1.07 04/30/2022 0812   CREATININE 0.98 08/01/2020 0924      Component Value Date/Time   CALCIUM 9.4 04/30/2022 0812   ALKPHOS 34 (L) 04/30/2022 0812   AST 16 04/30/2022 0812   ALT 11 04/30/2022 0812   BILITOT 0.5 04/30/2022 0812       Impression and Plan: Roger Kent is a 76 year old white male.  He had a thrombus in the left leg about 6 years ago.  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  I suspect he will always  have this.  I do not see any indication that we have to do another Doppler.  I am a little bit troubled by the fact that he has his anemia now.  His  iron studies clearly show that he is iron deficient.  Again I am not sure as to why he would be iron deficient since his stool was negative for blood.  He may have iron malabsorption.  I still think he is going to need some kind of endoscopy to see if there is any obvious problem. his hemoglobin is down 5 points from what it was 6 months ago.  Again I am not sure what might be going on.  We checked a stool for blood and this was all negative.  His vitamin B12 level is okay.  We will have to get him back probably about 6 weeks or so so we can follow-up with his anemia.  I think this is more of a problem.  I know he is on the aspirin.  Again his stool is heme-negative.    Volanda Napoleon, MD 6/13/20238:48 AM

## 2022-04-30 NOTE — Addendum Note (Signed)
Addended by: Burney Gauze R on: 04/30/2022 02:24 PM   Modules accepted: Orders

## 2022-04-30 NOTE — Telephone Encounter (Signed)
Per 04/30/22 los - gave upcoming appointments - confirmed

## 2022-05-01 ENCOUNTER — Telehealth: Payer: Self-pay | Admitting: *Deleted

## 2022-05-01 ENCOUNTER — Other Ambulatory Visit: Payer: Self-pay | Admitting: Internal Medicine

## 2022-05-01 DIAGNOSIS — D508 Other iron deficiency anemias: Secondary | ICD-10-CM

## 2022-05-01 NOTE — Telephone Encounter (Signed)
Called patient and gave upcoming appointment - Per staff message Roger Kent (1) dose of IV Iron (Monoferric) - confirmed

## 2022-05-02 ENCOUNTER — Ambulatory Visit (INDEPENDENT_AMBULATORY_CARE_PROVIDER_SITE_OTHER): Payer: Medicare Other

## 2022-05-02 DIAGNOSIS — M170 Bilateral primary osteoarthritis of knee: Secondary | ICD-10-CM

## 2022-05-02 MED ORDER — DENOSUMAB 60 MG/ML ~~LOC~~ SOSY
60.0000 mg | PREFILLED_SYRINGE | Freq: Once | SUBCUTANEOUS | Status: AC
  Administered 2022-05-02: 60 mg via SUBCUTANEOUS

## 2022-05-02 NOTE — Progress Notes (Cosign Needed Addendum)
After obtaining consent, and per orders of Dr. Alain Marion, injection of Prolia given by Max Sane. Patient tolerated injection well in right deltoid and instructed to report any adverse reaction to me immediately.   Medical screening examination/treatment/procedure(s) were performed by non-physician practitioner and as supervising physician I was immediately available for consultation/collaboration.  I agree with above. Lew Dawes, MD

## 2022-05-03 DIAGNOSIS — R972 Elevated prostate specific antigen [PSA]: Secondary | ICD-10-CM | POA: Diagnosis not present

## 2022-05-07 ENCOUNTER — Ambulatory Visit (INDEPENDENT_AMBULATORY_CARE_PROVIDER_SITE_OTHER): Payer: Medicare Other | Admitting: Rheumatology

## 2022-05-07 ENCOUNTER — Inpatient Hospital Stay: Payer: Medicare Other

## 2022-05-07 ENCOUNTER — Encounter: Payer: Self-pay | Admitting: Rheumatology

## 2022-05-07 VITALS — BP 130/69 | HR 70 | Resp 16 | Ht 73.0 in | Wt 192.0 lb

## 2022-05-07 VITALS — BP 120/65 | HR 63 | Temp 97.6°F

## 2022-05-07 DIAGNOSIS — M19041 Primary osteoarthritis, right hand: Secondary | ICD-10-CM

## 2022-05-07 DIAGNOSIS — N183 Chronic kidney disease, stage 3 unspecified: Secondary | ICD-10-CM

## 2022-05-07 DIAGNOSIS — D508 Other iron deficiency anemias: Secondary | ICD-10-CM

## 2022-05-07 DIAGNOSIS — D6862 Lupus anticoagulant syndrome: Secondary | ICD-10-CM | POA: Diagnosis not present

## 2022-05-07 DIAGNOSIS — I2583 Coronary atherosclerosis due to lipid rich plaque: Secondary | ICD-10-CM

## 2022-05-07 DIAGNOSIS — M170 Bilateral primary osteoarthritis of knee: Secondary | ICD-10-CM

## 2022-05-07 DIAGNOSIS — N401 Enlarged prostate with lower urinary tract symptoms: Secondary | ICD-10-CM

## 2022-05-07 DIAGNOSIS — N200 Calculus of kidney: Secondary | ICD-10-CM

## 2022-05-07 DIAGNOSIS — M19072 Primary osteoarthritis, left ankle and foot: Secondary | ICD-10-CM

## 2022-05-07 DIAGNOSIS — I2510 Atherosclerotic heart disease of native coronary artery without angina pectoris: Secondary | ICD-10-CM | POA: Diagnosis not present

## 2022-05-07 DIAGNOSIS — R350 Frequency of micturition: Secondary | ICD-10-CM

## 2022-05-07 DIAGNOSIS — Z86718 Personal history of other venous thrombosis and embolism: Secondary | ICD-10-CM

## 2022-05-07 DIAGNOSIS — M19071 Primary osteoarthritis, right ankle and foot: Secondary | ICD-10-CM

## 2022-05-07 DIAGNOSIS — M810 Age-related osteoporosis without current pathological fracture: Secondary | ICD-10-CM | POA: Diagnosis not present

## 2022-05-07 DIAGNOSIS — Z7901 Long term (current) use of anticoagulants: Secondary | ICD-10-CM | POA: Diagnosis not present

## 2022-05-07 DIAGNOSIS — E785 Hyperlipidemia, unspecified: Secondary | ICD-10-CM

## 2022-05-07 DIAGNOSIS — G629 Polyneuropathy, unspecified: Secondary | ICD-10-CM | POA: Diagnosis not present

## 2022-05-07 DIAGNOSIS — I10 Essential (primary) hypertension: Secondary | ICD-10-CM | POA: Diagnosis not present

## 2022-05-07 DIAGNOSIS — Q210 Ventricular septal defect: Secondary | ICD-10-CM

## 2022-05-07 DIAGNOSIS — I82451 Acute embolism and thrombosis of right peroneal vein: Secondary | ICD-10-CM | POA: Diagnosis not present

## 2022-05-07 DIAGNOSIS — M5136 Other intervertebral disc degeneration, lumbar region: Secondary | ICD-10-CM | POA: Diagnosis not present

## 2022-05-07 DIAGNOSIS — I82502 Chronic embolism and thrombosis of unspecified deep veins of left lower extremity: Secondary | ICD-10-CM | POA: Diagnosis not present

## 2022-05-07 DIAGNOSIS — Z8619 Personal history of other infectious and parasitic diseases: Secondary | ICD-10-CM

## 2022-05-07 DIAGNOSIS — D509 Iron deficiency anemia, unspecified: Secondary | ICD-10-CM | POA: Diagnosis not present

## 2022-05-07 DIAGNOSIS — M19042 Primary osteoarthritis, left hand: Secondary | ICD-10-CM

## 2022-05-07 DIAGNOSIS — Z8719 Personal history of other diseases of the digestive system: Secondary | ICD-10-CM

## 2022-05-07 DIAGNOSIS — K909 Intestinal malabsorption, unspecified: Secondary | ICD-10-CM

## 2022-05-07 DIAGNOSIS — M4156 Other secondary scoliosis, lumbar region: Secondary | ICD-10-CM

## 2022-05-07 DIAGNOSIS — K2270 Barrett's esophagus without dysplasia: Secondary | ICD-10-CM

## 2022-05-07 DIAGNOSIS — I471 Supraventricular tachycardia: Secondary | ICD-10-CM | POA: Diagnosis not present

## 2022-05-07 MED ORDER — SODIUM CHLORIDE 0.9 % IV SOLN
1000.0000 mg | Freq: Once | INTRAVENOUS | Status: AC
  Administered 2022-05-07: 1000 mg via INTRAVENOUS
  Filled 2022-05-07: qty 10

## 2022-05-07 MED ORDER — SODIUM CHLORIDE 0.9 % IV SOLN: Freq: Once | INTRAVENOUS | Status: AC

## 2022-05-07 NOTE — Patient Instructions (Signed)

## 2022-05-07 NOTE — Patient Instructions (Signed)
Hand Exercises Hand exercises can be helpful for almost anyone. These exercises can strengthen the hands, improve flexibility and movement, and increase blood flow to the hands. These results can make work and daily tasks easier. Hand exercises can be especially helpful for people who have joint pain from arthritis or have nerve damage from overuse (carpal tunnel syndrome). These exercises can also help people who have injured a hand. Exercises Most of these hand exercises are gentle stretching and motion exercises. It is usually safe to do them often throughout the day. Warming up your hands before exercise may help to reduce stiffness. You can do this with gentle massage or by placing your hands in warm water for 10-15 minutes. It is normal to feel some stretching, pulling, tightness, or mild discomfort as you begin new exercises. This will gradually improve. Stop an exercise right away if you feel sudden, severe pain or your pain gets worse. Ask your health care provider which exercises are best for you. Knuckle bend or "claw" fist  Stand or sit with your arm, hand, and all five fingers pointed straight up. Make sure to keep your wrist straight during the exercise. Gently bend your fingers down toward your palm until the tips of your fingers are touching the top of your palm. Keep your big knuckle straight and just bend the small knuckles in your fingers. Hold this position for __________ seconds. Straighten (extend) your fingers back to the starting position. Repeat this exercise 5-10 times with each hand. Full finger fist  Stand or sit with your arm, hand, and all five fingers pointed straight up. Make sure to keep your wrist straight during the exercise. Gently bend your fingers into your palm until the tips of your fingers are touching the middle of your palm. Hold this position for __________ seconds. Extend your fingers back to the starting position, stretching every joint fully. Repeat  this exercise 5-10 times with each hand. Straight fist Stand or sit with your arm, hand, and all five fingers pointed straight up. Make sure to keep your wrist straight during the exercise. Gently bend your fingers at the big knuckle, where your fingers meet your hand, and the middle knuckle. Keep the knuckle at the tips of your fingers straight and try to touch the bottom of your palm. Hold this position for __________ seconds. Extend your fingers back to the starting position, stretching every joint fully. Repeat this exercise 5-10 times with each hand. Tabletop  Stand or sit with your arm, hand, and all five fingers pointed straight up. Make sure to keep your wrist straight during the exercise. Gently bend your fingers at the big knuckle, where your fingers meet your hand, as far down as you can while keeping the small knuckles in your fingers straight. Think of forming a tabletop with your fingers. Hold this position for __________ seconds. Extend your fingers back to the starting position, stretching every joint fully. Repeat this exercise 5-10 times with each hand. Finger spread  Place your hand flat on a table with your palm facing down. Make sure your wrist stays straight as you do this exercise. Spread your fingers and thumb apart from each other as far as you can until you feel a gentle stretch. Hold this position for __________ seconds. Bring your fingers and thumb tight together again. Hold this position for __________ seconds. Repeat this exercise 5-10 times with each hand. Making circles  Stand or sit with your arm, hand, and all five fingers pointed   straight up. Make sure to keep your wrist straight during the exercise. Make a circle by touching the tip of your thumb to the tip of your index finger. Hold for __________ seconds. Then open your hand wide. Repeat this motion with your thumb and each finger on your hand. Repeat this exercise 5-10 times with each hand. Thumb  motion  Sit with your forearm resting on a table and your wrist straight. Your thumb should be facing up toward the ceiling. Keep your fingers relaxed as you move your thumb. Lift your thumb up as high as you can toward the ceiling. Hold for __________ seconds. Bend your thumb across your palm as far as you can, reaching the tip of your thumb for the small finger (pinkie) side of your palm. Hold for __________ seconds. Repeat this exercise 5-10 times with each hand. Grip strengthening  Hold a stress ball or other soft ball in the middle of your hand. Slowly increase the pressure, squeezing the ball as much as you can without causing pain. Think of bringing the tips of your fingers into the middle of your palm. All of your finger joints should bend when doing this exercise. Hold your squeeze for __________ seconds, then relax. Repeat this exercise 5-10 times with each hand. Contact a health care provider if: Your hand pain or discomfort gets much worse when you do an exercise. Your hand pain or discomfort does not improve within 2 hours after you exercise. If you have any of these problems, stop doing these exercises right away. Do not do them again unless your health care provider says that you can. Get help right away if: You develop sudden, severe hand pain or swelling. If this happens, stop doing these exercises right away. Do not do them again unless your health care provider says that you can. This information is not intended to replace advice given to you by your health care provider. Make sure you discuss any questions you have with your health care provider. Document Revised: 02/22/2021 Document Reviewed: 02/22/2021 Elsevier Patient Education  [place]. Knee Exercises Ask your health care provider which exercises are safe for you. Do exercises exactly as told by your health care provider and adjust them as directed. It is normal to feel mild stretching, pulling, tightness, or  discomfort as you do these exercises. Stop right away if you feel sudden pain or your pain gets worse. Do not begin these exercises until told by your health care provider. Stretching and range-of-motion exercises These exercises warm up your muscles and joints and improve the movement and flexibility of your knee. These exercises also help to relieve pain and swelling. Knee extension, prone  Lie on your abdomen (prone position) on a bed. Place your left / right knee just beyond the edge of the surface so your knee is not on the bed. You can put a towel under your left / right thigh just above your kneecap for comfort. Relax your leg muscles and allow gravity to straighten your knee (extension). You should feel a stretch behind your left / right knee. Hold this position for __________ seconds. Scoot up so your knee is supported between repetitions. Repeat __________ times. Complete this exercise __________ times a day. Knee flexion, active  Lie on your back with both legs straight. If this causes back discomfort, bend your left / right knee so your foot is flat on the floor. Slowly slide your left / right heel back toward your buttocks. Stop when  you feel a gentle stretch in the front of your knee or thigh (flexion). Hold this position for __________ seconds. Slowly slide your left / right heel back to the starting position. Repeat __________ times. Complete this exercise __________ times a day. Quadriceps stretch, prone  Lie on your abdomen on a firm surface, such as a bed or padded floor. Bend your left / right knee and hold your ankle. If you cannot reach your ankle or pant leg, loop a belt around your foot and grab the belt instead. Gently pull your heel toward your buttocks. Your knee should not slide out to the side. You should feel a stretch in the front of your thigh and knee (quadriceps). Hold this position for __________ seconds. Repeat __________ times. Complete this exercise  __________ times a day. Hamstring, supine  Lie on your back (supine position). Loop a belt or towel over the ball of your left / right foot. The ball of your foot is on the walking surface, right under your toes. Straighten your left / right knee and slowly pull on the belt to raise your leg until you feel a gentle stretch behind your knee (hamstring). Do not let your knee bend while you do this. Keep your other leg flat on the floor. Hold this position for __________ seconds. Repeat __________ times. Complete this exercise __________ times a day. Strengthening exercises These exercises build strength and endurance in your knee. Endurance is the ability to use your muscles for a long time, even after they get tired. Quadriceps, isometric This exercise strengthens the muscles in front of your thigh (quadriceps) without moving your knee joint (isometric). Lie on your back with your left / right leg extended and your other knee bent. Put a rolled towel or small pillow under your knee if told by your health care provider. Slowly tense the muscles in the front of your left / right thigh. You should see your kneecap slide up toward your hip or see increased dimpling just above the knee. This motion will push the back of the knee toward the floor. For __________ seconds, hold the muscle as tight as you can without increasing your pain. Relax the muscles slowly and completely. Repeat __________ times. Complete this exercise __________ times a day. Straight leg raises This exercise strengthens the muscles in front of your thigh (quadriceps) and the muscles that move your hips (hip flexors). Lie on your back with your left / right leg extended and your other knee bent. Tense the muscles in the front of your left / right thigh. You should see your kneecap slide up or see increased dimpling just above the knee. Your thigh may even shake a bit. Keep these muscles tight as you raise your leg 4-6 inches  (10-15 cm) off the floor. Do not let your knee bend. Hold this position for __________ seconds. Keep these muscles tense as you lower your leg. Relax your muscles slowly and completely after each repetition. Repeat __________ times. Complete this exercise __________ times a day. Hamstring, isometric  Lie on your back on a firm surface. Bend your left / right knee about __________ degrees. Dig your left / right heel into the surface as if you are trying to pull it toward your buttocks. Tighten the muscles in the back of your thighs (hamstring) to "dig" as hard as you can without increasing any pain. Hold this position for __________ seconds. Release the tension gradually and allow your muscles to relax completely for __________ seconds  after each repetition. Repeat __________ times. Complete this exercise __________ times a day. Hamstring curls If told by your health care provider, do this exercise while wearing ankle weights. Begin with __________lb / kg weights. Then increase the weight by 1 lb (0.5 kg) increments. Do not wear ankle weights that are more than __________lb / kg. Lie on your abdomen with your legs straight. Bend your left / right knee as far as you can without feeling pain. Keep your hips flat against the floor. Hold this position for __________ seconds. Slowly lower your leg to the starting position. Repeat __________ times. Complete this exercise __________ times a day. Squats This exercise strengthens the muscles in front of your thigh and knee (quadriceps). Stand in front of a table, with your feet and knees pointing straight ahead. You may rest your hands on the table for balance but not for support. Slowly bend your knees and lower your hips like you are going to sit in a chair. Keep your weight over your heels, not over your toes. Keep your lower legs upright so they are parallel with the table legs. Do not let your hips go lower than your knees. Do not bend lower  than told by your health care provider. If your knee pain increases, do not bend as low. Hold the squat position for __________ seconds. Slowly push with your legs to return to standing. Do not use your hands to pull yourself to standing. Repeat __________ times. Complete this exercise __________ times a day. Wall slides This exercise strengthens the muscles in front of your thigh and knee (quadriceps). Lean your back against a smooth wall or door, and walk your feet out 18-24 inches (46-61 cm) from it. Place your feet hip-width apart. Slowly slide down the wall or door until your knees bend __________ degrees. Keep your knees over your heels, not over your toes. Keep your knees in line with your hips. Hold this position for __________ seconds. Repeat __________ times. Complete this exercise __________ times a day. Straight leg raises, side-lying This exercise strengthens the muscles that rotate the leg at the hip and move it away from your body (hip abductors). Lie on your side with your left / right leg in the top position. Lie so your head, shoulder, knee, and hip line up. You may bend your bottom knee to help you keep your balance. Roll your hips slightly forward so your hips are stacked directly over each other and your left / right knee is facing forward. Leading with your heel, lift your top leg 4-6 inches (10-15 cm). You should feel the muscles in your outer hip lifting. Do not let your foot drift forward. Do not let your knee roll toward the ceiling. Hold this position for __________ seconds. Slowly return your leg to the starting position. Let your muscles relax completely after each repetition. Repeat __________ times. Complete this exercise __________ times a day. Straight leg raises, prone This exercise stretches the muscles that move your hips away from the front of the pelvis (hip extensors). Lie on your abdomen on a firm surface. You can put a pillow under your hips if that  is more comfortable. Tense the muscles in your buttocks and lift your left / right leg about 4-6 inches (10-15 cm). Keep your knee straight as you lift your leg. Hold this position for __________ seconds. Slowly lower your leg to the starting position. Let your leg relax completely after each repetition. Repeat __________ times. Complete this exercise   __________ times a day. This information is not intended to replace advice given to you by your health care provider. Make sure you discuss any questions you have with your health care provider. Document Revised: 07/17/2021 Document Reviewed: 07/17/2021 Elsevier Patient Education  [place]. Back Exercises The following exercises strengthen the muscles that help to support the trunk (torso) and back. They also help to keep the lower back flexible. Doing these exercises can help to prevent or lessen existing low back pain. If you have back pain or discomfort, try doing these exercises 2-3 times each day or as told by your health care provider. As your pain improves, do them once each day, but increase the number of times that you repeat the steps for each exercise (do more repetitions). To prevent the recurrence of back pain, continue to do these exercises once each day or as told by your health care provider. Do exercises exactly as told by your health care provider and adjust them as directed. It is normal to feel mild stretching, pulling, tightness, or discomfort as you do these exercises, but you should stop right away if you feel sudden pain or your pain gets worse. Exercises Single knee to chest Repeat these steps 3-5 times for each leg: Lie on your back on a firm bed or the floor with your legs extended. Bring one knee to your chest. Your other leg should stay extended and in contact with the floor. Hold your knee in place by grabbing your knee or thigh with both hands and hold. Pull on your knee until you feel a gentle stretch in  your lower back or buttocks. Hold the stretch for 10-30 seconds. Slowly release and straighten your leg.  Pelvic tilt Repeat these steps 5-10 times: Lie on your back on a firm bed or the floor with your legs extended. Bend your knees so they are pointing toward the ceiling and your feet are flat on the floor. Tighten your lower abdominal muscles to press your lower back against the floor. This motion will tilt your pelvis so your tailbone points up toward the ceiling instead of pointing to your feet or the floor. With gentle tension and even breathing, hold this position for 5-10 seconds.  Cat-cow Repeat these steps until your lower back becomes more flexible: Get into a hands-and-knees position on a firm bed or the floor. Keep your hands under your shoulders, and keep your knees under your hips. You may place padding under your knees for comfort. Let your head hang down toward your chest. Contract your abdominal muscles and point your tailbone toward the floor so your lower back becomes rounded like the back of a cat. Hold this position for 5 seconds. Slowly lift your head, let your abdominal muscles relax, and point your tailbone up toward the ceiling so your back forms a sagging arch like the back of a cow. Hold this position for 5 seconds.  Press-ups Repeat these steps 5-10 times: Lie on your abdomen (face-down) on a firm bed or the floor. Place your palms near your head, about shoulder-width apart. Keeping your back as relaxed as possible and keeping your hips on the floor, slowly straighten your arms to raise the top half of your body and lift your shoulders. Do not use your back muscles to raise your upper torso. You may adjust the placement of your hands to make yourself more comfortable. Hold this position for 5 seconds while you keep your back relaxed. Slowly return to  lying flat on the floor.  Bridges Repeat these steps 10 times: Lie on your back on a firm bed or the  floor. Bend your knees so they are pointing toward the ceiling and your feet are flat on the floor. Your arms should be flat at your sides, next to your body. Tighten your buttocks muscles and lift your buttocks off the floor until your waist is at almost the same height as your knees. You should feel the muscles working in your buttocks and the back of your thighs. If you do not feel these muscles, slide your feet 1-2 inches (2.5-5 cm) farther away from your buttocks. Hold this position for 3-5 seconds. Slowly lower your hips to the starting position, and allow your buttocks muscles to relax completely. If this exercise is too easy, try doing it with your arms crossed over your chest. Abdominal crunches Repeat these steps 5-10 times: Lie on your back on a firm bed or the floor with your legs extended. Bend your knees so they are pointing toward the ceiling and your feet are flat on the floor. Cross your arms over your chest. Tip your chin slightly toward your chest without bending your neck. Tighten your abdominal muscles and slowly raise your torso high enough to lift your shoulder blades a tiny bit off the floor. Avoid raising your torso higher than that because it can put too much stress on your lower back and does not help to strengthen your abdominal muscles. Slowly return to your starting position.  Back lifts Repeat these steps 5-10 times: Lie on your abdomen (face-down) with your arms at your sides, and rest your forehead on the floor. Tighten the muscles in your legs and your buttocks. Slowly lift your chest off the floor while you keep your hips pressed to the floor. Keep the back of your head in line with the curve in your back. Your eyes should be looking at the floor. Hold this position for 3-5 seconds. Slowly return to your starting position.  Contact a health care provider if: Your back pain or discomfort gets much worse when you do an exercise. Your worsening back pain or  discomfort does not lessen within 2 hours after you exercise. If you have any of these problems, stop doing these exercises right away. Do not do them again unless your health care provider says that you can. Get help right away if: You develop sudden, severe back pain. If this happens, stop doing the exercises right away. Do not do them again unless your health care provider says that you can. This information is not intended to replace advice given to you by your health care provider. Make sure you discuss any questions you have with your health care provider. Document Revised: 05/01/2021 Document Reviewed: 01/17/2021 Elsevier Patient Education  [place].

## 2022-05-08 ENCOUNTER — Ambulatory Visit: Payer: Medicare Other

## 2022-05-08 ENCOUNTER — Encounter: Payer: Self-pay | Admitting: Physician Assistant

## 2022-05-08 DIAGNOSIS — M1712 Unilateral primary osteoarthritis, left knee: Secondary | ICD-10-CM | POA: Diagnosis not present

## 2022-05-08 DIAGNOSIS — G8929 Other chronic pain: Secondary | ICD-10-CM | POA: Diagnosis not present

## 2022-05-08 DIAGNOSIS — M25562 Pain in left knee: Secondary | ICD-10-CM | POA: Diagnosis not present

## 2022-05-08 NOTE — Telephone Encounter (Signed)
Last Prolia inj 05/02/22 Next Prolia inj due 11/02/22 

## 2022-05-13 ENCOUNTER — Ambulatory Visit
Admission: RE | Admit: 2022-05-13 | Discharge: 2022-05-13 | Disposition: A | Discharge status: Home / Self Care | Payer: Medicare Other | Source: Ambulatory Visit | Attending: Cardiology | Admitting: Cardiology

## 2022-05-13 DIAGNOSIS — I2510 Atherosclerotic heart disease of native coronary artery without angina pectoris: Secondary | ICD-10-CM | POA: Diagnosis not present

## 2022-05-13 DIAGNOSIS — J8410 Pulmonary fibrosis, unspecified: Secondary | ICD-10-CM | POA: Diagnosis not present

## 2022-05-13 DIAGNOSIS — I712 Thoracic aortic aneurysm, without rupture, unspecified: Secondary | ICD-10-CM | POA: Diagnosis not present

## 2022-05-13 DIAGNOSIS — J984 Other disorders of lung: Secondary | ICD-10-CM | POA: Diagnosis not present

## 2022-05-13 MED ORDER — IOPAMIDOL (ISOVUE-370) INJECTION 76%
75.0000 mL | Freq: Once | INTRAVENOUS | Status: AC | PRN
  Administered 2022-05-13: 75 mL via INTRAVENOUS

## 2022-05-14 ENCOUNTER — Encounter: Payer: Self-pay | Admitting: Cardiology

## 2022-05-22 DIAGNOSIS — M1711 Unilateral primary osteoarthritis, right knee: Secondary | ICD-10-CM | POA: Diagnosis not present

## 2022-05-22 DIAGNOSIS — M170 Bilateral primary osteoarthritis of knee: Secondary | ICD-10-CM | POA: Diagnosis not present

## 2022-05-22 DIAGNOSIS — G8929 Other chronic pain: Secondary | ICD-10-CM | POA: Diagnosis not present

## 2022-06-03 ENCOUNTER — Encounter: Payer: Self-pay | Admitting: Physician Assistant

## 2022-06-03 ENCOUNTER — Ambulatory Visit (INDEPENDENT_AMBULATORY_CARE_PROVIDER_SITE_OTHER): Payer: Medicare Other | Admitting: Physician Assistant

## 2022-06-03 VITALS — BP 126/72 | HR 80 | Ht 73.0 in | Wt 179.0 lb

## 2022-06-03 DIAGNOSIS — D509 Iron deficiency anemia, unspecified: Secondary | ICD-10-CM | POA: Diagnosis not present

## 2022-06-03 NOTE — Patient Instructions (Signed)
You have been scheduled for an endoscopy and colonoscopy. Please follow the written instructions given to you at your visit today. Please pick up your prep supplies at the pharmacy within the next 1-3 days. If you use inhalers (even only as needed), please bring them with you on the day of your procedure.   Due to recent changes in healthcare laws, you may see the results of your imaging and laboratory studies on MyChart before your provider has had a chance to review them.  We understand that in some cases there may be results that are confusing or concerning to you. Not all laboratory results come back in the same time frame and the provider may be waiting for multiple results in order to interpret others.  Please give [place] 48 hours in order for your provider to thoroughly review all the results before contacting the office for clarification of your results.    Thank you for choosing [facility]  [place]

## 2022-06-03 NOTE — Progress Notes (Signed)
Chief Complaint: Iron deficiency anemia  HPI:    Roger Kent is a 76 year old male, previously known to Dr. Earlean Shawl, with a past medical history as listed below including hepatitis C, Barrett's, DVT (01/04/2020 echo with an LVEF 60-65%), iron deficiency anemia and multiple others listed below, who was referred to me by Plotnikov, Evie Lacks, MD for a complaint of iron deficiency anemia.    05/20/2013 colonoscopy with Dr. Earlean Shawl with diverticulosis and otherwise normal.    04/30/2022 CBC with a hemoglobin low at 9.1 (14.4 on 12/22).  Ferritin low at 7.  Iron low at 11, percent saturation low at 3.    05/07/2022 patient underwent iron infusion.    Today, patient presents to clinic with his wife, who does assist with history.  They explained the patient was recently told that he was iron deficient.  To his knowledge he has never been iron deficient before.  He does describe an episode of hematuria years ago which was looked at with a cystoscopy but they could never find a source.  He has not had any hematuria recently.  Describes being seen by his PCP who did a Hemoccult test which was negative.  He has not noticed any bright red blood or black tarry sticky stools.  In fact he had not really had many symptoms from his iron deficiency, was still going about his regular day without any fatigue or shortness of breath.  Tells me after the iron infusion he has not really noticed any change either.  He feels like his diet is well-rounded.    Denies fever, chills, weight loss or blood in his stool.  Past Medical History:  Diagnosis Date   Acute hepatitis C without mention of hepatic coma(070.51)    Allergic rhinitis, cause unspecified    Anxiety state, unspecified    Barrett's esophagus    BPH (benign prostatic hyperplasia)    DVT (deep venous thrombosis) (HCC)    Elevated PSA    Dr Karsten Ro   Esophageal reflux    Hiatal hernia    Hyperlipidemia    Hypertension    Hypertonicity of bladder    Insomnia     Iron deficiency anemia 04/30/2022   Iron malabsorption 04/30/2022   Leg DVT (deep venous thromboembolism), chronic, unspecified laterality ([place]) 09/21/2018   Osteoporosis, unspecified    Personal history of urinary calculi    Recent retinal detachment, partial, with giant tear    Unspecified tinnitus    Ventricular septal defect     Past Surgical History:  Procedure Laterality Date   KNEE SURGERY Bilateral    RETINAL DETACHMENT SURGERY     TOE SURGERY Left    great toe    Current Outpatient Medications  Medication Sig Dispense Refill   aspirin 325 MG tablet aspirin 325 mg tablet  Take 1 tablet every day by oral route.     Carboxymethylcellulose Sodium (EYE DROPS OP) Apply to eye.     cholecalciferol (VITAMIN D) 1000 UNITS tablet Take 1,000 Units by mouth every morning.     Cyanocobalamin (VITAMIN B 12 PO) Take by mouth.     EPINEPHrine (EPIPEN 2-PAK) 0.3 mg/0.3 mL IJ SOAJ injection Inject 0.3 mLs (0.3 mg total) into the muscle as needed for anaphylaxis. 1 Device 3   glucosamine-chondroitin 500-400 MG tablet Take 2 tablets by mouth every morning.      Lactobacillus (CVS ACIDOPHILUS PO) Take by mouth daily.     Multiple Vitamin (MULTIVITAMIN WITH MINERALS) TABS tablet Take 1 tablet  by mouth every morning.     olmesartan (BENICAR) 40 MG tablet TAKE 1 TABLET DAILY 90 tablet 3   olopatadine (PATANOL) 0.1 % ophthalmic solution Place 1 drop into both eyes daily at 6 (six) AM. (Patient not taking: Reported on 05/07/2022)     omeprazole-sodium bicarbonate (ZEGERID) 40-1100 MG per capsule Take 1 capsule by mouth daily before breakfast.     OVER THE COUNTER MEDICATION Take 3 capsules by mouth every morning. OTC herbal supplement for prostate health     REPATHA SURECLICK 233 MG/ML SOAJ INJECT ONE PEN ('140MG'$ ) INTO THE SKIN EVERY 14 DAYS 6 mL 2   No current facility-administered medications for this visit.    Allergies as of 06/03/2022 - Review Complete 05/07/2022  Allergen Reaction Noted    Oxycodone-acetaminophen Other (See Comments) 08/21/2015   Crestor [rosuvastatin]  12/13/2019   Zetia [ezetimibe]  05/18/2020   Lamisil [terbinafine] Rash 12/22/2015    Family History  Problem Relation Age of Onset   Hypertension Other    Alcohol abuse Neg Hx     Social History   Socioeconomic History   Marital status: Married    Spouse name: Quenten Nawaz   Number of children: Not on file   Years of education: Not on file   Highest education level: Not on file  Occupational History   Occupation: retired    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   Occupation: bee keepin  Tobacco Use   Smoking status: Former    Packs/day: 1.00    Years: 50.00    Total pack years: 50.00    Types: Cigarettes    Quit date: 1976    Years since quitting: 47.5    Passive exposure: Past   Smokeless tobacco: Never  Vaping Use   Vaping Use: Never used  Substance and Sexual Activity   Alcohol use: No   Drug use: No   Sexual activity: Yes  Other Topics Concern   Not on file  Social History Narrative               Social Determinants of Health   Financial Resource Strain: Low Risk  (11/06/2021)   Overall Financial Resource Strain (CARDIA)    Difficulty of Paying Living Expenses: Not hard at all  Food Insecurity: Unknown (11/06/2021)   Hunger Vital Sign    Worried About Running Out of Food in the Last Year: Never true    [place] in the Last Year: Not on file  Transportation Needs: No Transportation Needs (11/06/2021)   PRAPARE - Hydrologist (Medical): No    Lack of Transportation (Non-Medical): No  Physical Activity: Sufficiently Active (11/06/2021)   Exercise Vital Sign    Days of Exercise per Week: 7 days    Minutes of Exercise per Session: 30 min  Stress: No Stress Concern Present (11/06/2021)   [place]    Feeling of Stress : Not at all  Social Connections: [place] (11/06/2021)   Social Connection and Isolation Panel [NHANES]    Frequency of Communication with Friends and Family: More than three times a week    Frequency of Social Gatherings with Friends and Family: More than three times a week    Attends Religious Services: More than 4 times per year    Active Member of [employer] or Organizations: Yes    Attends Archivist Meetings: More than 4 times per  year    Marital Status: Married  Human resources officer Violence: Not At Risk (11/06/2021)   Humiliation, Afraid, Rape, and Kick questionnaire    Fear of Current or Ex-Partner: No    Emotionally Abused: No    Physically Abused: No    Sexually Abused: No    Review of Systems:    Constitutional: No weight loss, fever or chills Skin: No rash Cardiovascular: No chest pain Respiratory: No SOB  Gastrointestinal: See HPI and otherwise negative Genitourinary: No dysuria Neurological: No headache, dizziness or syncope Musculoskeletal: No new muscle or joint pain Hematologic: No bleeding  Psychiatric: No history of depression or anxiety    Physical Exam:  Vital signs: BP 126/72   Pulse 80   Ht '6\' 1"'$  (1.854 m)   Wt 179 lb (81.2 kg)   SpO2 97%   BMI 23.62 kg/m    Constitutional:   Pleasant Caucasian male appears to be in NAD, Well developed, Well nourished, alert and cooperative Head:  Normocephalic and atraumatic. Eyes:   PEERL, EOMI. No icterus. Conjunctiva pink. Ears:  Normal auditory acuity. Neck:  Supple Throat: Oral cavity and pharynx without inflammation, swelling or lesion.  Respiratory: Respirations even and unlabored. Lungs clear to auscultation bilaterally.   No wheezes, crackles, or rhonchi.  Cardiovascular: Normal S1, S2. No MRG. Regular rate and rhythm. No peripheral edema, cyanosis or pallor.  Gastrointestinal:  Soft, nondistended, nontender. No rebound or guarding. Normal bowel sounds. No appreciable masses or hepatomegaly. Rectal:  Not performed.  Msk:   Symmetrical without gross deformities. Without edema, no deformity or joint abnormality.  Neurologic:  Alert and  oriented x4;  grossly normal neurologically.  Skin:   Dry and intact without significant lesions or rashes. Psychiatric:  Demonstrates good judgement and reason without abnormal affect or behaviors.  RELEVANT LABS AND IMAGING: CBC    Component Value Date/Time   WBC 6.7 04/30/2022 0812   WBC 5.3 08/01/2020 0925   RBC 3.45 (L) 04/30/2022 0905   RBC 3.57 (L) 04/30/2022 0812   HGB 9.1 (L) 04/30/2022 0812   HCT 29.9 (L) 04/30/2022 0812   PLT 377 04/30/2022 0812   MCV 83.8 04/30/2022 0812   MCH 25.5 (L) 04/30/2022 0812   MCHC 30.4 04/30/2022 0812   RDW 18.5 (H) 04/30/2022 0812   LYMPHSABS 2.1 04/30/2022 0812   MONOABS 0.8 04/30/2022 0812   EOSABS 0.3 04/30/2022 0812   BASOSABS 0.0 04/30/2022 0812    CMP     Component Value Date/Time   NA 142 04/30/2022 0812   NA 140 05/10/2021 0842   K 4.1 04/30/2022 0812   CL 108 04/30/2022 0812   CO2 27 04/30/2022 0812   GLUCOSE 87 04/30/2022 0812   GLUCOSE 125 (H) 10/17/2006 1200   BUN 17 04/30/2022 0812   BUN 15 05/10/2021 0842   CREATININE 1.07 04/30/2022 0812   CREATININE 0.98 08/01/2020 0924   CALCIUM 9.4 04/30/2022 0812   PROT 7.1 04/30/2022 0812   PROT 6.8 03/19/2022 0924   ALBUMIN 4.4 04/30/2022 0812   ALBUMIN 4.1 03/19/2022 0924   AST 16 04/30/2022 0812   ALT 11 04/30/2022 0812   ALKPHOS 34 (L) 04/30/2022 0812   BILITOT 0.5 04/30/2022 0812   GFRNONAA >60 04/30/2022 0812   GFRNONAA 76 08/01/2020 0924   GFRAA 88 08/01/2020 0924    Assessment: 1.  Iron deficiency anemia: Recent diagnosis in May, underwent iron infusion, repeat labs scheduled 7/25 by hematology/oncology, no sign of bleeding per patient, last colonoscopy in 2014; consider  GI source of blood loss versus other  Plan: 1.  Scheduled patient for diagnostic EGD and colonoscopy in the [place] with Dr. Lorenso Courier as she is supervising this morning.  Did provide  the patient a detailed list of risks for the procedures and he agrees to proceed. Patient is appropriate for endoscopic procedure(s) in the ambulatory ([place]) setting.  2.  Advised the patient to continue to follow with Dr. Marin Olp in regards to ongoing labs and iron infusions.  We will check back at his labs on 7/26 to ensure that his hemoglobin is high enough for him to undergo procedures. 3.  Patient to follow in clinic per recommendations after time of procedures.  Ellouise Newer, PA-C [facility] 06/03/2022, 9:33 AM  Cc: Plotnikov, Evie Lacks, MD

## 2022-06-03 NOTE — Progress Notes (Signed)
I agree with the assessment and plan as outlined by Ms. Lemmon. 

## 2022-06-08 ENCOUNTER — Encounter: Payer: Self-pay | Admitting: Hematology & Oncology

## 2022-06-11 ENCOUNTER — Other Ambulatory Visit: Payer: Self-pay

## 2022-06-11 ENCOUNTER — Inpatient Hospital Stay: Payer: Medicare Other | Attending: Hematology & Oncology

## 2022-06-11 ENCOUNTER — Inpatient Hospital Stay (HOSPITAL_BASED_OUTPATIENT_CLINIC_OR_DEPARTMENT_OTHER): Payer: Medicare Other | Admitting: Hematology & Oncology

## 2022-06-11 ENCOUNTER — Encounter: Payer: Self-pay | Admitting: Hematology & Oncology

## 2022-06-11 VITALS — BP 133/70 | HR 72 | Temp 97.8°F | Resp 18 | Wt 187.0 lb

## 2022-06-11 DIAGNOSIS — D509 Iron deficiency anemia, unspecified: Secondary | ICD-10-CM | POA: Insufficient documentation

## 2022-06-11 DIAGNOSIS — D500 Iron deficiency anemia secondary to blood loss (chronic): Secondary | ICD-10-CM

## 2022-06-11 DIAGNOSIS — I82509 Chronic embolism and thrombosis of unspecified deep veins of unspecified lower extremity: Secondary | ICD-10-CM

## 2022-06-11 DIAGNOSIS — D6862 Lupus anticoagulant syndrome: Secondary | ICD-10-CM | POA: Insufficient documentation

## 2022-06-11 DIAGNOSIS — I82502 Chronic embolism and thrombosis of unspecified deep veins of left lower extremity: Secondary | ICD-10-CM | POA: Insufficient documentation

## 2022-06-11 DIAGNOSIS — Z08 Encounter for follow-up examination after completed treatment for malignant neoplasm: Secondary | ICD-10-CM | POA: Diagnosis not present

## 2022-06-11 DIAGNOSIS — Z7982 Long term (current) use of aspirin: Secondary | ICD-10-CM | POA: Insufficient documentation

## 2022-06-11 DIAGNOSIS — Z872 Personal history of diseases of the skin and subcutaneous tissue: Secondary | ICD-10-CM | POA: Diagnosis not present

## 2022-06-11 DIAGNOSIS — Z85828 Personal history of other malignant neoplasm of skin: Secondary | ICD-10-CM | POA: Diagnosis not present

## 2022-06-11 DIAGNOSIS — R229 Localized swelling, mass and lump, unspecified: Secondary | ICD-10-CM | POA: Diagnosis not present

## 2022-06-11 DIAGNOSIS — L821 Other seborrheic keratosis: Secondary | ICD-10-CM | POA: Diagnosis not present

## 2022-06-11 DIAGNOSIS — D229 Melanocytic nevi, unspecified: Secondary | ICD-10-CM | POA: Diagnosis not present

## 2022-06-11 LAB — CBC WITH DIFFERENTIAL (CANCER CENTER ONLY)
Abs Immature Granulocytes: 0.1 10*3/uL — ABNORMAL HIGH (ref 0.00–0.07)
Basophils Absolute: 0 10*3/uL (ref 0.0–0.1)
Basophils Relative: 0 %
Eosinophils Absolute: 0.2 10*3/uL (ref 0.0–0.5)
Eosinophils Relative: 2 %
HCT: 40 % (ref 39.0–52.0)
Hemoglobin: 12.5 g/dL — ABNORMAL LOW (ref 13.0–17.0)
Immature Granulocytes: 1 %
Lymphocytes Relative: 30 %
Lymphs Abs: 2.4 10*3/uL (ref 0.7–4.0)
MCH: 26.5 pg (ref 26.0–34.0)
MCHC: 31.3 g/dL (ref 30.0–36.0)
MCV: 84.7 fL (ref 80.0–100.0)
Monocytes Absolute: 0.8 10*3/uL (ref 0.1–1.0)
Monocytes Relative: 10 %
Neutro Abs: 4.6 10*3/uL (ref 1.7–7.7)
Neutrophils Relative %: 57 %
Platelet Count: 275 10*3/uL (ref 150–400)
RBC: 4.72 MIL/uL (ref 4.22–5.81)
RDW: 25.2 % — ABNORMAL HIGH (ref 11.5–15.5)
WBC Count: 8.1 10*3/uL (ref 4.0–10.5)
nRBC: 0 % (ref 0.0–0.2)

## 2022-06-11 LAB — IRON AND IRON BINDING CAPACITY (CC-WL,HP ONLY)
Iron: 55 ug/dL (ref 45–182)
Saturation Ratios: 16 % — ABNORMAL LOW (ref 17.9–39.5)
TIBC: 336 ug/dL (ref 250–450)
UIBC: 281 ug/dL (ref 117–376)

## 2022-06-11 LAB — RETICULOCYTES
Immature Retic Fract: 13 % (ref 2.3–15.9)
RBC.: 4.65 MIL/uL (ref 4.22–5.81)
Retic Count, Absolute: 28.4 10*3/uL (ref 19.0–186.0)
Retic Ct Pct: 0.6 % (ref 0.4–3.1)

## 2022-06-11 LAB — FERRITIN: Ferritin: 26 ng/mL (ref 24–336)

## 2022-06-11 NOTE — Progress Notes (Signed)
Hematology and Oncology Follow Up Visit  Roger Kent [id number] 10/05/1946 76 y.o. 06/11/2022   Principle Diagnosis:  DVT of the right peroneal vein SVT of the right saphenous vein (+) lupus anti-coagulant Iron deficiency anemia  Current Therapy:   Xarelto 20 mg po q day -- 1 yr of therapeutic to complete in 05/2019 Xarelto 10 mg po q day -- maintenance x 9yr-- start 05/2019 -- completed on 06/2020 EC ASA 81 mg po q day -- started on 01/11/2019 -- changed to 325 mg po q day on 06/05/2020 IV iron-Monoferric given on 05/03/2022     Interim History:  Roger Kent back for follow-up.  He is back for a quick visit.  When we last saw him back in June, we found he was iron deficient.  At the time, we found that his ferritin was only 7 with an iron saturation of 3%.  He is going to have an upper and lower endoscopy in August.  He does take Prilosec every day.  I am unsure why he takes this every day.  Again, he has been on aspirin.  This is for the history of thromboembolic disease.  I would not think that he has gastritis but again this may be an issue.  He has had no problems with nausea or vomiting.  He has had no change in bowel or bladder habits.  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  Currently, I was his performance status is probably ECOG 1.     Medications:  Current Outpatient Medications:    aspirin 325 MG tablet, aspirin 325 mg tablet  Take 1 tablet every day by oral route., Disp: , Rfl:    Carboxymethylcellulose Sodium (EYE DROPS OP), Apply to eye., Disp: , Rfl:    cholecalciferol (VITAMIN D) 1000 UNITS tablet, Take 1,000 Units by mouth every morning., Disp: , Rfl:    Cyanocobalamin (VITAMIN B 12 PO), Take by mouth., Disp: , Rfl:    EPINEPHrine (EPIPEN 2-PAK) 0.3 mg/0.3 mL IJ SOAJ injection, Inject 0.3 mLs (0.3 mg total) into the muscle as needed for anaphylaxis., Disp: 1 Device, Rfl: 3   glucosamine-chondroitin  500-400 MG tablet, Take 2 tablets by mouth every morning. , Disp: , Rfl:    Lactobacillus (CVS ACIDOPHILUS PO), Take by mouth daily., Disp: , Rfl:    Multiple Vitamin (MULTIVITAMIN WITH MINERALS) TABS tablet, Take 1 tablet by mouth every morning., Disp: , Rfl:    olmesartan (BENICAR) 40 MG tablet, TAKE 1 TABLET DAILY, Disp: 90 tablet, Rfl: 3   olopatadine (PATANOL) 0.1 % ophthalmic solution, Place 1 drop into both eyes daily at 6 (six) AM., Disp: , Rfl:    omeprazole-sodium bicarbonate (ZEGERID) 40-1100 MG per capsule, Take 1 capsule by mouth daily before breakfast., Disp: , Rfl:    OVER THE COUNTER MEDICATION, Take 3 capsules by mouth every morning. OTC herbal supplement for prostate health, Disp: , Rfl:    REPATHA SURECLICK 1037MG/ML SOAJ, INJECT ONE PEN ('140MG'$ ) INTO THE SKIN EVERY 14 DAYS, Disp: 6 mL, Rfl: 2  Allergies:  Allergies  Allergen Reactions   Oxycodone-Acetaminophen Other (See Comments)    Other Reaction: agitation   Crestor [Rosuvastatin]    Zetia [Ezetimibe]     Brain fog, felt sluggish   Lamisil [Terbinafine] Rash    Past Medical History, Surgical history, Social history, and Family History were reviewed and updated.  Review of Systems: Review of Systems  Constitutional: Negative.   HENT:  Negative.    Eyes: Negative.   Respiratory: Negative.    Cardiovascular: Negative.   Gastrointestinal: Negative.   Endocrine: Negative.   Genitourinary: Negative.    Musculoskeletal: Negative.   Skin: Negative.   Neurological: Negative.   Hematological: Negative.   Psychiatric/Behavioral: Negative.      Physical Exam:  weight is 187 lb (84.8 kg). His oral temperature is 97.8 F (36.6 C). His blood pressure is 133/70 and his pulse is 72. His respiration is 18 and oxygen saturation is 98%.   Wt Readings from Last 3 Encounters:  06/11/22 187 lb (84.8 kg)  06/03/22 179 lb (81.2 kg)  05/07/22 192 lb (87.1 kg)    Physical Exam Vitals reviewed.  HENT:     Head:  Normocephalic and atraumatic.  Eyes:     Pupils: Pupils are equal, round, and reactive to light.  Cardiovascular:     Rate and Rhythm: Normal rate and regular rhythm.     Heart sounds: Normal heart sounds.  Pulmonary:     Effort: Pulmonary effort is normal.     Breath sounds: Normal breath sounds.  Abdominal:     General: Bowel sounds are normal.     Palpations: Abdomen is soft.  Genitourinary:    Comments: Rectal exam showed no external hemorrhoids.  He had no mass in the rectal vault.  His stool was brown and heme negative. Musculoskeletal:        General: No tenderness or deformity. Normal range of motion.     Cervical back: Normal range of motion.  Lymphadenopathy:     Cervical: No cervical adenopathy.  Skin:    General: Skin is warm and dry.     Findings: No erythema or rash.  Neurological:     Mental Status: He is alert and oriented to person, place, and time.  Psychiatric:        Behavior: Behavior normal.        Thought Content: Thought content normal.        Judgment: Judgment normal.     Lab Results  Component Value Date   WBC 8.1 06/11/2022   HGB 12.5 (L) 06/11/2022   HCT 40.0 06/11/2022   MCV 84.7 06/11/2022   PLT 275 06/11/2022     Chemistry      Component Value Date/Time   NA 142 04/30/2022 0812   NA 140 05/10/2021 0842   K 4.1 04/30/2022 0812   CL 108 04/30/2022 0812   CO2 27 04/30/2022 0812   BUN 17 04/30/2022 0812   BUN 15 05/10/2021 0842   CREATININE 1.07 04/30/2022 0812   CREATININE 0.98 08/01/2020 0924      Component Value Date/Time   CALCIUM 9.4 04/30/2022 0812   ALKPHOS 34 (L) 04/30/2022 0812   AST 16 04/30/2022 0812   ALT 11 04/30/2022 0812   BILITOT 0.5 04/30/2022 0812       Impression and Plan: Roger Kent is a 76 year old white male.  He had a thrombus in the left leg about 6 years ago.  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  I suspect he will always have this.  I do not see any indication that we have to do another  Doppler.  I am glad that is going to have the upper and lower endoscopy.  I think he clearly needs this to see why he was so iron deficient.  His last colonoscopy was about 11 years ago.  I would like  to see him back in September.  We will have to see what the upper endoscopy and colonoscopy have to show up.    Volanda Napoleon, MD 7/25/20239:01 AM

## 2022-06-12 ENCOUNTER — Encounter: Payer: Self-pay | Admitting: Hematology & Oncology

## 2022-06-24 ENCOUNTER — Encounter: Payer: Self-pay | Admitting: Internal Medicine

## 2022-06-24 ENCOUNTER — Ambulatory Visit (INDEPENDENT_AMBULATORY_CARE_PROVIDER_SITE_OTHER): Payer: Medicare Other | Admitting: Internal Medicine

## 2022-06-24 DIAGNOSIS — I2583 Coronary atherosclerosis due to lipid rich plaque: Secondary | ICD-10-CM

## 2022-06-24 DIAGNOSIS — D509 Iron deficiency anemia, unspecified: Secondary | ICD-10-CM | POA: Diagnosis not present

## 2022-06-24 DIAGNOSIS — R972 Elevated prostate specific antigen [PSA]: Secondary | ICD-10-CM

## 2022-06-24 DIAGNOSIS — G8929 Other chronic pain: Secondary | ICD-10-CM

## 2022-06-24 DIAGNOSIS — I2510 Atherosclerotic heart disease of native coronary artery without angina pectoris: Secondary | ICD-10-CM | POA: Diagnosis not present

## 2022-06-24 DIAGNOSIS — K2270 Barrett's esophagus without dysplasia: Secondary | ICD-10-CM

## 2022-06-24 DIAGNOSIS — N183 Chronic kidney disease, stage 3 unspecified: Secondary | ICD-10-CM

## 2022-06-24 DIAGNOSIS — M170 Bilateral primary osteoarthritis of knee: Secondary | ICD-10-CM | POA: Diagnosis not present

## 2022-06-24 DIAGNOSIS — M25561 Pain in right knee: Secondary | ICD-10-CM | POA: Diagnosis not present

## 2022-06-24 DIAGNOSIS — M25562 Pain in left knee: Secondary | ICD-10-CM | POA: Diagnosis not present

## 2022-06-24 NOTE — Progress Notes (Signed)
Subjective:  Patient ID: Roger Kent, male    DOB: 1946/05/20  Age: 76 y.o. MRN: [id number]  CC: Follow-up (3 month f/u)   HPI Casanova Schurman Randa presents for knee OA - s/p ablation F/u GERD, anemia, DVT   Outpatient Medications Prior to Visit  Medication Sig Dispense Refill   aspirin 325 MG tablet aspirin 325 mg tablet  Take 1 tablet every day by oral route.     Carboxymethylcellulose Sodium (EYE DROPS OP) Apply to eye.     cholecalciferol (VITAMIN D) 1000 UNITS tablet Take 1,000 Units by mouth every morning.     Cyanocobalamin (VITAMIN B 12 PO) Take by mouth.     EPINEPHrine (EPIPEN 2-PAK) 0.3 mg/0.3 mL IJ SOAJ injection Inject 0.3 mLs (0.3 mg total) into the muscle as needed for anaphylaxis. 1 Device 3   glucosamine-chondroitin 500-400 MG tablet Take 2 tablets by mouth every morning.      Lactobacillus (CVS ACIDOPHILUS PO) Take by mouth daily.     Multiple Vitamin (MULTIVITAMIN WITH MINERALS) TABS tablet Take 1 tablet by mouth every morning.     olmesartan (BENICAR) 40 MG tablet TAKE 1 TABLET DAILY 90 tablet 3   olopatadine (PATANOL) 0.1 % ophthalmic solution Place 1 drop into both eyes daily at 6 (six) AM.     OVER THE COUNTER MEDICATION Take 3 capsules by mouth every morning. OTC herbal supplement for prostate health     REPATHA SURECLICK 326 MG/ML SOAJ INJECT ONE PEN ('140MG'$ ) INTO THE SKIN EVERY 14 DAYS 6 mL 2   omeprazole-sodium bicarbonate (ZEGERID) 40-1100 MG per capsule Take 1 capsule by mouth daily before breakfast. (Patient not taking: Reported on 06/24/2022)     No facility-administered medications prior to visit.    ROS: Review of Systems  Constitutional:  Negative for appetite change, fatigue and unexpected weight change.  HENT:  Negative for congestion, nosebleeds, sneezing, sore throat and trouble swallowing.   Eyes:  Negative for itching and visual disturbance.  Respiratory:  Negative for cough.   Cardiovascular:  Negative for chest pain,  palpitations and leg swelling.  Gastrointestinal:  Negative for abdominal distention, blood in stool, diarrhea and nausea.  Genitourinary:  Negative for frequency and hematuria.  Musculoskeletal:  Positive for arthralgias, back pain and gait problem. Negative for joint swelling and neck pain.  Skin:  Negative for rash.  Neurological:  Negative for dizziness, tremors, speech difficulty and weakness.  Psychiatric/Behavioral:  Negative for agitation, dysphoric mood and sleep disturbance. The patient is not nervous/anxious.     Objective:  BP 130/68 (BP Location: Left Arm, Patient Position: Sitting, Cuff Size: Large)   Pulse 80   Temp 97.7 F (36.5 C) (Temporal)   Ht '6\' 1"'$  (1.854 m)   Wt 190 lb (86.2 kg)   SpO2 96%   BMI 25.07 kg/m   BP Readings from Last 3 Encounters:  06/24/22 130/68  06/11/22 133/70  06/03/22 126/72    Wt Readings from Last 3 Encounters:  06/24/22 190 lb (86.2 kg)  06/11/22 187 lb (84.8 kg)  06/03/22 179 lb (81.2 kg)    Physical Exam Constitutional:      General: He is not in acute distress.    Appearance: He is well-developed.     Comments: NAD  Eyes:     Conjunctiva/sclera: Conjunctivae normal.     Pupils: Pupils are equal, round, and reactive to light.  Neck:     Thyroid: No thyromegaly.     Vascular: No JVD.  Cardiovascular:  Rate and Rhythm: Normal rate and regular rhythm.     Heart sounds: Murmur heard.     No friction rub. No gallop.  Pulmonary:     Effort: Pulmonary effort is normal. No respiratory distress.     Breath sounds: Normal breath sounds. No wheezing or rales.  Chest:     Chest wall: No tenderness.  Abdominal:     General: Bowel sounds are normal. There is no distension.     Palpations: Abdomen is soft. There is no mass.     Tenderness: There is no abdominal tenderness. There is no guarding or rebound.  Musculoskeletal:        General: No tenderness. Normal range of motion.     Cervical back: Normal range of motion.   Lymphadenopathy:     Cervical: No cervical adenopathy.  Skin:    General: Skin is warm and dry.     Findings: No rash.  Neurological:     Mental Status: He is alert and oriented to person, place, and time.     Cranial Nerves: No cranial nerve deficit.     Motor: No abnormal muscle tone.     Coordination: Coordination normal.     Gait: Gait normal.     Deep Tendon Reflexes: Reflexes are normal and symmetric.  Psychiatric:        Behavior: Behavior normal.        Thought Content: Thought content normal.        Judgment: Judgment normal.     Lab Results  Component Value Date   WBC 8.1 06/11/2022   HGB 12.5 (L) 06/11/2022   HCT 40.0 06/11/2022   PLT 275 06/11/2022   GLUCOSE 87 04/30/2022   CHOL 94 (L) 03/19/2022   TRIG 55 03/19/2022   HDL 42 03/19/2022   LDLCALC 39 03/19/2022   ALT 11 04/30/2022   AST 16 04/30/2022   NA 142 04/30/2022   K 4.1 04/30/2022   CL 108 04/30/2022   CREATININE 1.07 04/30/2022   BUN 17 04/30/2022   CO2 27 04/30/2022   TSH 3.03 04/16/2022   PSA 32.27 (H) 03/19/2022   INR 2.4 RATIO (H) 09/17/2007   HGBA1C 5.8 12/29/2018    CT ANGIO CHEST AORTA W &/OR WO CONTRAST  Result Date: 05/13/2022 CLINICAL DATA:  Follow-up thoracic aortic aneurysm. EXAM: CT ANGIOGRAPHY CHEST WITH CONTRAST TECHNIQUE: Multidetector CT imaging of the chest was performed using the standard protocol during bolus administration of intravenous contrast. Multiplanar CT image reconstructions and MIPs were obtained to evaluate the vascular anatomy. RADIATION DOSE REDUCTION: This exam was performed according to the departmental dose-optimization program which includes automated exposure control, adjustment of the mA and/or kV according to patient size and/or use of iterative reconstruction technique. CONTRAST:  28m ISOVUE-370 IOPAMIDOL (ISOVUE-370) INJECTION 76% COMPARISON:  05/14/2021 FINDINGS: Cardiovascular: The ascending thoracic aorta continues to measure 4.1 cm in maximum diameter.  Minimal aortic and coronary artery calcification. Borderline enlarged heart. Borderline enlarged central pulmonary arteries. Mediastinum/Nodes: No enlarged mediastinal, hilar, or axillary lymph nodes. Normal sized calcified left hilar lymph nodes. Thyroid gland, trachea, and esophagus demonstrate no significant findings. Lungs/Pleura: Bibasilar linear scarring without significant change. Stable left lower lobe calcified granuloma. No airspace consolidation or pleural fluid. Upper Abdomen: Mild diffuse low density of the liver. Small calcified splenic granulomata. Bilateral simple appearing renal cysts. Musculoskeletal: Moderate thoracolumbar scoliosis. Thoracic and lower cervical spine degenerative changes. Review of the MIP images confirms the above findings. IMPRESSION: 1. Stable 4.1 cm ascending thoracic  aortic aneurysm. Recommend annual imaging followup by CTA or MRA. This recommendation follows 2010 ACCF/AHA/AATS/ACR/ASA/SCA/SCAI/SIR/STS/SVM Guidelines for the Diagnosis and Management of Patients with Thoracic Aortic Disease. Circulation. 2010; 121: B017-P102. Aortic aneurysm NOS (ICD10-I71.9) 2. Minimal calcific coronary artery and aortic atherosclerosis. 3. Borderline enlarged central pulmonary arteries. This could be an indication of mild pulmonary arterial hypertension. 4. Previous granulomatous infection. 5. Mild diffuse hepatic steatosis. Aortic Atherosclerosis (ICD10-I70.0). Electronically Signed   By: Claudie Revering M.D.   On: 05/13/2022 16:19    Assessment & Plan:   Problem List Items Addressed This Visit     Barrett esophagus    EGD/colon pending - Dr Lorenso Courier      CRI (chronic renal insufficiency), stage 3 (moderate) (HCC)    Improved fluid intake      Elevated PSA, greater than or equal to 20 ng/ml    Urology f/u  - Dr Louis Meckel Pt had an MRI      Iron deficiency anemia (Chronic)    F/u Dr Marin Olp On IV iron      Knee osteoarthritis    S/p ablation 2023      Knee pain, chronic     S/p ablation 2023         No orders of the defined types were placed in this encounter.     Follow-up: Return in about 6 months (around 12/25/2022) for Wellness Exam.  Walker Kehr, MD

## 2022-06-24 NOTE — Assessment & Plan Note (Signed)
EGD/colon pending - Dr Lorenso Courier

## 2022-06-24 NOTE — Patient Instructions (Addendum)
Blue-Emu cream use 2-3 times a day   Primary osteoarthritis of both hands - Right CMC subluxation and bilateral second and third MCP joint narrowing was noted.  He works as a Neurosurgeon.  Chondrocalcinosis noted in the left wrist joint.  X-ray findings were discussed with the patient.  Uric acid, sed rate, rheumatoid factor and anti-CCP were all within normal limits.  Lab findings were also discussed with the patient.  A handout on hand muscle strengthening exercises were given and exercises were demonstrated in the office.   Primary osteoarthritis of both knees - Status post Visco and a stem cell.  Dr. Theda Sers recommended bilateral total knee replacement per patient.  Patient was advised to bring x-rays.  He continues to have discomfort in his knee joints.  I do not have the x-rays to review.  Patient states that he will be getting nerve block at [facility] to relieve pain.  A handout on lower extremity muscle strengthening exercises were given.  I did detailed discussion with the patient regarding the letter from his wife.  Patient believes that he needs to continue to work to stay fit.  He states that work is not strenuous for him.  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   Primary osteoarthritis of both feet - Clinical and radiographic findings were consistent with osteoarthritis.  Patient is followed by Dr. Paulla Dolly.  Proper fitting shoes were discussed.   Other secondary scoliosis, lumbar region

## 2022-06-24 NOTE — Assessment & Plan Note (Signed)
Improved fluid intake

## 2022-06-24 NOTE — Assessment & Plan Note (Signed)
Urology f/u  - Dr Louis Meckel Pt had an MRI

## 2022-06-24 NOTE — Assessment & Plan Note (Signed)
S/p ablation 2023

## 2022-06-24 NOTE — Assessment & Plan Note (Signed)
F/u Dr Marin Olp On IV iron

## 2022-06-25 DIAGNOSIS — M170 Bilateral primary osteoarthritis of knee: Secondary | ICD-10-CM | POA: Diagnosis not present

## 2022-06-25 DIAGNOSIS — G8929 Other chronic pain: Secondary | ICD-10-CM | POA: Diagnosis not present

## 2022-06-25 DIAGNOSIS — M25562 Pain in left knee: Secondary | ICD-10-CM | POA: Diagnosis not present

## 2022-06-25 DIAGNOSIS — M25561 Pain in right knee: Secondary | ICD-10-CM | POA: Diagnosis not present

## 2022-07-03 ENCOUNTER — Encounter: Payer: Self-pay | Admitting: Certified Registered Nurse Anesthetist

## 2022-07-10 ENCOUNTER — Ambulatory Visit (AMBULATORY_SURGERY_CENTER): Payer: Medicare Other | Admitting: Internal Medicine

## 2022-07-10 ENCOUNTER — Encounter: Payer: Self-pay | Admitting: Internal Medicine

## 2022-07-10 VITALS — BP 102/64 | HR 55 | Temp 97.7°F | Resp 15 | Ht 73.0 in | Wt 179.0 lb

## 2022-07-10 DIAGNOSIS — I890 Lymphedema, not elsewhere classified: Secondary | ICD-10-CM | POA: Diagnosis not present

## 2022-07-10 DIAGNOSIS — F419 Anxiety disorder, unspecified: Secondary | ICD-10-CM | POA: Diagnosis not present

## 2022-07-10 DIAGNOSIS — K5730 Diverticulosis of large intestine without perforation or abscess without bleeding: Secondary | ICD-10-CM

## 2022-07-10 DIAGNOSIS — K3189 Other diseases of stomach and duodenum: Secondary | ICD-10-CM | POA: Diagnosis not present

## 2022-07-10 DIAGNOSIS — K649 Unspecified hemorrhoids: Secondary | ICD-10-CM

## 2022-07-10 DIAGNOSIS — K2970 Gastritis, unspecified, without bleeding: Secondary | ICD-10-CM

## 2022-07-10 DIAGNOSIS — K2270 Barrett's esophagus without dysplasia: Secondary | ICD-10-CM

## 2022-07-10 DIAGNOSIS — K449 Diaphragmatic hernia without obstruction or gangrene: Secondary | ICD-10-CM | POA: Diagnosis not present

## 2022-07-10 DIAGNOSIS — D509 Iron deficiency anemia, unspecified: Secondary | ICD-10-CM

## 2022-07-10 DIAGNOSIS — I10 Essential (primary) hypertension: Secondary | ICD-10-CM | POA: Diagnosis not present

## 2022-07-10 DIAGNOSIS — K2951 Unspecified chronic gastritis with bleeding: Secondary | ICD-10-CM | POA: Diagnosis not present

## 2022-07-10 MED ORDER — FERROUS SULFATE 325 (65 FE) MG PO TABS: 325.0000 mg | ORAL_TABLET | Freq: Every day | ORAL | 2 refills | Status: DC

## 2022-07-10 MED ORDER — SODIUM CHLORIDE 0.9 % IV SOLN: 500.0000 mL | Freq: Once | INTRAVENOUS | Status: DC

## 2022-07-10 NOTE — Progress Notes (Signed)
GASTROENTEROLOGY PROCEDURE H&P NOTE   Primary Care Physician: Cassandria Anger, MD    Reason for Procedure:   IDA, Barrett's esophagus  Plan:    EGD/colonoscopy  Patient is appropriate for endoscopic procedure(s) in the ambulatory ([place]) setting.  The nature of the procedure, as well as the risks, benefits, and alternatives were carefully and thoroughly reviewed with the patient. Ample time for discussion and questions allowed. The patient understood, was satisfied, and agreed to proceed.     HPI: Roger Kent is a 76 y.o. male who presents for EGD/colonoscopy for evaluation of IDA .  Patient was most recently seen in the Gastroenterology Clinic on 06/03/22.  No interval change in medical history since that appointment. Please refer to that note for full details regarding GI history and clinical presentation.   Past Medical History:  Diagnosis Date   Acute hepatitis C without mention of hepatic coma(070.51)    Allergic rhinitis, cause unspecified    Anxiety state, unspecified    Arthritis    Barrett's esophagus    BPH (benign prostatic hyperplasia)    DVT (deep venous thrombosis) (HCC)    Elevated PSA    Dr Karsten Ro   Esophageal reflux    Hiatal hernia    Hyperlipidemia    Hypertension    Hypertonicity of bladder    Insomnia    Iron deficiency anemia 04/30/2022   Iron malabsorption 04/30/2022   Leg DVT (deep venous thromboembolism), chronic, unspecified laterality ([place]) 09/21/2018   Osteoporosis, unspecified    Personal history of urinary calculi    Recent retinal detachment, partial, with giant tear    Unspecified tinnitus    Ventricular septal defect     Past Surgical History:  Procedure Laterality Date   KNEE SURGERY Bilateral    RETINAL DETACHMENT SURGERY     TOE SURGERY Left    great toe    Prior to Admission medications   Medication Sig Start Date End Date Taking? Authorizing Provider  aspirin 325 MG tablet aspirin 325 mg tablet  Take 1  tablet every day by oral route.    [provider]  Carboxymethylcellulose Sodium (EYE DROPS OP) Apply to eye.    [provider]  cholecalciferol (VITAMIN D) 1000 UNITS tablet Take 1,000 Units by mouth every morning.    [provider]  Cyanocobalamin (VITAMIN B 12 PO) Take by mouth.    [provider]  EPINEPHrine (EPIPEN 2-PAK) 0.3 mg/0.3 mL IJ SOAJ injection Inject 0.3 mLs (0.3 mg total) into the muscle as needed for anaphylaxis. 02/22/19   Plotnikov, Evie Lacks, MD  glucosamine-chondroitin 500-400 MG tablet Take 2 tablets by mouth every morning.     [provider]  Lactobacillus (CVS ACIDOPHILUS PO) Take by mouth daily.    [provider]  Multiple Vitamin (MULTIVITAMIN WITH MINERALS) TABS tablet Take 1 tablet by mouth every morning.    [provider]  olmesartan (BENICAR) 40 MG tablet TAKE 1 TABLET DAILY 11/26/21   Plotnikov, Evie Lacks, MD  olopatadine (PATANOL) 0.1 % ophthalmic solution Place 1 drop into both eyes daily at 6 (six) AM.    [provider]  OVER THE COUNTER MEDICATION Take 3 capsules by mouth every morning. OTC herbal supplement for prostate health    [provider]  REPATHA SURECLICK 638 MG/ML SOAJ INJECT ONE PEN ('140MG'$ ) INTO THE SKIN EVERY 14 DAYS 11/26/21   Lelon Perla, MD    Current Outpatient Medications  Medication Sig Dispense Refill  aspirin 325 MG tablet aspirin 325 mg tablet  Take 1 tablet every day by oral route.     Carboxymethylcellulose Sodium (EYE DROPS OP) Apply to eye.     cholecalciferol (VITAMIN D) 1000 UNITS tablet Take 1,000 Units by mouth every morning.     Cyanocobalamin (VITAMIN B 12 PO) Take by mouth.     EPINEPHrine (EPIPEN 2-PAK) 0.3 mg/0.3 mL IJ SOAJ injection Inject 0.3 mLs (0.3 mg total) into the muscle as needed for anaphylaxis. 1 Device 3   glucosamine-chondroitin 500-400 MG tablet Take 2 tablets by mouth every morning.      Lactobacillus (CVS ACIDOPHILUS PO)  Take by mouth daily.     Multiple Vitamin (MULTIVITAMIN WITH MINERALS) TABS tablet Take 1 tablet by mouth every morning.     olmesartan (BENICAR) 40 MG tablet TAKE 1 TABLET DAILY 90 tablet 3   olopatadine (PATANOL) 0.1 % ophthalmic solution Place 1 drop into both eyes daily at 6 (six) AM.     OVER THE COUNTER MEDICATION Take 3 capsules by mouth every morning. OTC herbal supplement for prostate health     REPATHA SURECLICK 106 MG/ML SOAJ INJECT ONE PEN ('140MG'$ ) INTO THE SKIN EVERY 14 DAYS 6 mL 2   Current Facility-Administered Medications  Medication Dose Route Frequency Provider Last Rate Last Admin   0.9 %  sodium chloride infusion  500 mL Intravenous Once Sharyn Creamer, MD        Allergies as of 07/10/2022 - Review Complete 07/10/2022  Allergen Reaction Noted   Oxycodone-acetaminophen Other (See Comments) 08/21/2015   Crestor [rosuvastatin]  12/13/2019   Zetia [ezetimibe]  05/18/2020   Lamisil [terbinafine] Rash 12/22/2015    Family History  Problem Relation Age of Onset   Ovarian cancer Mother    Deep vein thrombosis Mother    Hypertension Other    Alcohol abuse Neg Hx    Colon cancer Neg Hx    Stomach cancer Neg Hx    Esophageal cancer Neg Hx    Colon polyps Neg Hx     Social History   Socioeconomic History   Marital status: Married    Spouse name: [name] Boardman   Number of children: 1   Years of education: Not on file   Highest education level: Not on file  Occupational History   Occupation: retired    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   Occupation: bee keepin  Tobacco Use   Smoking status: Former    Packs/day: 1.00    Years: 50.00    Total pack years: 50.00    Types: Cigarettes    Quit date: 1976    Years since quitting: 47.6    Passive exposure: Past   Smokeless tobacco: Never  Vaping Use   Vaping Use: Never used  Substance and Sexual Activity   Alcohol use: No   Drug use: No   Sexual activity: Yes  Other Topics Concern   Not on file   Social History Narrative               Social Determinants of Health   Financial Resource Strain: Low Risk  (11/06/2021)   Overall Financial Resource Strain (CARDIA)    Difficulty of Paying Living Expenses: Not hard at all  Food Insecurity: Unknown (11/06/2021)   Hunger Vital Sign    Worried About Running Out of Food in the Last Year: Never true    Ran Out of Food in the Last Year: Not on file  Transportation Needs: No Transportation Needs (11/06/2021)   PRAPARE - Hydrologist (Medical): No    Lack of Transportation (Non-Medical): No  Physical Activity: Sufficiently Active (11/06/2021)   Exercise Vital Sign    Days of Exercise per Week: 7 days    Minutes of Exercise per Session: 30 min  Stress: No Stress Concern Present (11/06/2021)   [place]    Feeling of Stress : Not at all  Social Connections: [place] (11/06/2021)   Social Connection and Isolation Panel [NHANES]    Frequency of Communication with Friends and Family: More than three times a week    Frequency of Social Gatherings with Friends and Family: More than three times a week    Attends Religious Services: More than 4 times per year    Active Member of [employer] or Organizations: Yes    Attends Music therapist: More than 4 times per year    Marital Status: Married  Human resources officer Violence: Not At Risk (11/06/2021)   Humiliation, Afraid, Rape, and Kick questionnaire    Fear of Current or Ex-Partner: No    Emotionally Abused: No    Physically Abused: No    Sexually Abused: No    Physical Exam: Vital signs in last 24 hours: BP 132/72   Pulse 72   Temp 97.7 F (36.5 C) (Skin)   Ht '6\' 1"'$  (1.854 m)   Wt 179 lb (81.2 kg)   SpO2 97%   BMI 23.62 kg/m  GEN: NAD EYE: Sclerae anicteric ENT: MMM CV: Non-tachycardic Pulm: No increased WOB GI: Soft NEURO:  Alert & Oriented   [name],  MD [facility]   07/10/2022 1:05 PM

## 2022-07-10 NOTE — Op Note (Addendum)
[name] Procedure Date: 07/10/2022 1:14 PM MRN: [id number] Endoscopist: Sonny Masters "Roger Kent ,  Age: 76 Referring MD:  Date of Birth: [birth year] Gender: Male Account #: [id number] Procedure:                Upper GI endoscopy Indications:              Iron deficiency anemia, Follow-up of Barrett's                            esophagus Medicines:                Monitored Anesthesia Care Procedure:                Pre-Anesthesia Assessment:                           - Prior to the procedure, a History and Physical                            was performed, and patient medications and                            allergies were reviewed. The patient's tolerance of                            previous anesthesia was also reviewed. The risks                            and benefits of the procedure and the sedation                            options and risks were discussed with the patient.                            All questions were answered, and informed consent                            was obtained. Prior Anticoagulants: The patient has                            taken no previous anticoagulant or antiplatelet                            agents. ASA Grade Assessment: III - A patient with                            severe systemic disease. After reviewing the risks                            and benefits, the patient was deemed in                            satisfactory condition to undergo the procedure.  After obtaining informed consent, the endoscope was                            passed under direct vision. Throughout the                            procedure, the patient's blood pressure, pulse, and                            oxygen saturations were monitored continuously. The                            GIF [id number] #[id number] was introduced through the                            mouth, and advanced to the second part of  duodenum.                            The upper GI endoscopy was accomplished without                            difficulty. The patient tolerated the procedure                            well. Scope In: Scope Out: Findings:                 Salmon-colored mucosa was present. The maximum                            longitudinal extent of these esophageal mucosal                            changes was 1 cm in length. Mucosa was biopsied                            with a cold forceps for histology. One specimen                            bottle was sent to pathology.                           A 2 cm hiatal hernia was present.                           Localized mild inflammation characterized by                            congestion (edema) and erythema was found in the                            gastric antrum. Biopsies were taken with a cold                            forceps for histology.  The examined duodenum was normal. Biopsies were                            taken with a cold forceps for histology. Complications:            No immediate complications. Estimated Blood Loss:     Estimated blood loss was minimal. Impression:               - Salmon-colored mucosa classified as Barrett's                            stage C1-M1 per [place] criteria. Biopsied.                           - 2 cm hiatal hernia.                           - Gastritis. Biopsied.                           - Normal examined duodenum. Biopsied. Recommendation:           - Await pathology results.                           - Perform a colonoscopy today. [id number]ia Kent,  07/10/2022 2:29:02 PM

## 2022-07-10 NOTE — Progress Notes (Signed)
Called to room to assist during endoscopic procedure.  Patient ID and intended procedure confirmed with present staff. Received instructions for my participation in the procedure from the performing physician.  

## 2022-07-10 NOTE — Op Note (Addendum)
[facility] Patient Name: Roger Kent Procedure Date: 07/10/2022 1:13 PM MRN: [id number] Endoscopist: Sonny Masters "Roger Kent ,  Age: 76 Referring MD:  Date of Birth: August 29, 1946 Gender: Male Account #: [id number] Procedure:                Colonoscopy Indications:              Iron deficiency anemia Medicines:                Monitored Anesthesia Care Procedure:                Pre-Anesthesia Assessment:                           - Prior to the procedure, a History and Physical                            was performed, and patient medications and                            allergies were reviewed. The patient's tolerance of                            previous anesthesia was also reviewed. The risks                            and benefits of the procedure and the sedation                            options and risks were discussed with the patient.                            All questions were answered, and informed consent                            was obtained. Prior Anticoagulants: The patient has                            taken no previous anticoagulant or antiplatelet                            agents. ASA Grade Assessment: III - A patient with                            severe systemic disease. After reviewing the risks                            and benefits, the patient was deemed in                            satisfactory condition to undergo the procedure.                           After obtaining informed consent, the colonoscope  was passed under direct vision. Throughout the                            procedure, the patient's blood pressure, pulse, and                            oxygen saturations were monitored continuously. The                            Olympus PCF-H190DL (#[id number]) Colonoscope was                            introduced through the anus and advanced to the the                            terminal ileum. The colonoscopy  was performed                            without difficulty. The patient tolerated the                            procedure well. The quality of the bowel                            preparation was good. The terminal ileum, ileocecal                            valve, appendiceal orifice, and rectum were                            photographed. Scope In: 1:50:54 PM Scope Out: 2:25:34 PM Scope Withdrawal Time: 0 hours 15 minutes 51 seconds  Total Procedure Duration: 0 hours 34 minutes 40 seconds  Findings:                 The terminal ileum appeared normal.                           Multiple small and large-mouthed diverticula were                            found in the sigmoid colon, descending colon,                            transverse colon and ascending colon.                           Non-bleeding internal hemorrhoids were found during                            retroflexion. Complications:            No immediate complications. Estimated Blood Loss:     Estimated blood loss: none. Impression:               - The examined portion of the ileum was normal.                           -  Diverticulosis in the sigmoid colon, in the                            descending colon, in the transverse colon and in                            the ascending colon.                           - Non-bleeding internal hemorrhoids.                           - No specimens collected. Recommendation:           - Discharge patient to home (with escort).                           - Since your iron deficiency anemia has improved                            significantly, I do not think that further work up                            for your IDA is necessary at this time.                           - Start daily iron supplement ferrous sulfate 325                            mg daily.                           - Based upon your age and comorbidities, would be                            okay to discontinue  future colon cancer screening.                           - Return to GI clinic in 2 months for follow up.                           - The findings and recommendations were discussed                            with the patient. [address]Christia Kent,  07/10/2022 2:33:47 PM

## 2022-07-10 NOTE — Progress Notes (Signed)
1335 Robinul 0.1 mg IV given due large amount of secretions upon assessment.  MD made aware, vss 

## 2022-07-10 NOTE — Progress Notes (Signed)
Report given to PACU, vss 

## 2022-07-10 NOTE — Patient Instructions (Signed)
Handouts on diverticulosis and hemorrhoids given.   YOU HAD AN ENDOSCOPIC PROCEDURE TODAY AT [facility]:   Refer to the procedure report that was given to you for any specific questions about what was found during the examination.  If the procedure report does not answer your questions, please call your gastroenterologist to clarify.  If you requested that your care partner not be given the details of your procedure findings, then the procedure report has been included in a sealed envelope for you to review at your convenience later.  YOU SHOULD EXPECT: Some feelings of bloating in the abdomen. Passage of more gas than usual.  Walking can help get rid of the air that was put into your GI tract during the procedure and reduce the bloating. If you had a lower endoscopy (such as a colonoscopy or flexible sigmoidoscopy) you may notice spotting of blood in your stool or on the toilet paper. If you underwent a bowel prep for your procedure, you may not have a normal bowel movement for a few days.  Please Note:  You might notice some irritation and congestion in your nose or some drainage.  This is from the oxygen used during your procedure.  There is no need for concern and it should clear up in a day or so.  SYMPTOMS TO REPORT IMMEDIATELY:  Following lower endoscopy (colonoscopy or flexible sigmoidoscopy):  Excessive amounts of blood in the stool  Significant tenderness or worsening of abdominal pains  Swelling of the abdomen that is new, acute  Fever of 100F or higher  Following upper endoscopy (EGD)  Vomiting of blood or coffee ground material  New chest pain or pain under the shoulder blades  Painful or persistently difficult swallowing  New shortness of breath  Fever of 100F or higher  Black, tarry-looking stools  For urgent or emergent issues, a gastroenterologist can be reached at any hour by calling [phone]. Do not use MyChart messaging for urgent concerns.     DIET:  We do recommend a small meal at first, but then you may proceed to your regular diet.  Drink plenty of fluids but you should avoid alcoholic beverages for 24 hours.  ACTIVITY:  You should plan to take it easy for the rest of today and you should NOT DRIVE or use heavy machinery until tomorrow (because of the sedation medicines used during the test).    FOLLOW UP: Our staff will call the number listed on your records the next business day following your procedure.  We will call around 7:15- 8:00 am to check on you and address any questions or concerns that you may have regarding the information given to you following your procedure. If we do not reach you, we will leave a message.  If you develop any symptoms (ie: fever, flu-like symptoms, shortness of breath, cough etc.) before then, please call [phone].  If you test positive for Covid 19 in the 2 weeks post procedure, please call and report this information to [place].    If any biopsies were taken you will be contacted by phone or by letter within the next 1-3 weeks.  Please call us at [phone] if you have not heard about the biopsies in 3 weeks.    SIGNATURES/CONFIDENTIALITY: You and/or your care partner have signed paperwork which will be entered into your electronic medical record.  These signatures attest to the fact that that the information above on your After Visit Summary has been reviewed and  is understood.  Full responsibility of the confidentiality of this discharge information lies with you and/or your care-partner.

## 2022-07-11 ENCOUNTER — Telehealth: Payer: Self-pay | Admitting: *Deleted

## 2022-07-11 NOTE — Telephone Encounter (Signed)
  Follow up Call-     07/10/2022    1:02 PM  Call back number  Post procedure Call Back phone  # [phone]  Permission to leave phone message Yes     Patient questions:  Do you have a fever, pain , or abdominal swelling? No. Pain Score  0 *  Have you tolerated food without any problems? Yes.    Have you been able to return to your normal activities? Yes.    Do you have any questions about your discharge instructions: Diet   No. Medications  No. Follow up visit  No.  Do you have questions or concerns about your Care? No.  Actions: * If pain score is 4 or above: No action needed, pain <4.

## 2022-07-16 ENCOUNTER — Encounter: Payer: Self-pay | Admitting: Internal Medicine

## 2022-08-01 ENCOUNTER — Encounter: Payer: Self-pay | Admitting: Nurse Practitioner

## 2022-08-01 ENCOUNTER — Ambulatory Visit: Payer: Medicare Other | Attending: Nurse Practitioner | Admitting: Nurse Practitioner

## 2022-08-01 VITALS — BP 138/65 | HR 65 | Ht 73.0 in | Wt 190.4 lb

## 2022-08-01 DIAGNOSIS — I351 Nonrheumatic aortic (valve) insufficiency: Secondary | ICD-10-CM

## 2022-08-01 DIAGNOSIS — I10 Essential (primary) hypertension: Secondary | ICD-10-CM | POA: Diagnosis not present

## 2022-08-01 DIAGNOSIS — Q210 Ventricular septal defect: Secondary | ICD-10-CM

## 2022-08-01 DIAGNOSIS — I723 Aneurysm of iliac artery: Secondary | ICD-10-CM | POA: Diagnosis not present

## 2022-08-01 DIAGNOSIS — I712 Thoracic aortic aneurysm, without rupture, unspecified: Secondary | ICD-10-CM | POA: Diagnosis not present

## 2022-08-01 DIAGNOSIS — E785 Hyperlipidemia, unspecified: Secondary | ICD-10-CM | POA: Diagnosis not present

## 2022-08-01 DIAGNOSIS — I2510 Atherosclerotic heart disease of native coronary artery without angina pectoris: Secondary | ICD-10-CM | POA: Diagnosis not present

## 2022-08-01 NOTE — Progress Notes (Signed)
Office Visit    Patient Name: Aimee Timmons Dalia Date of Encounter: 08/01/2022  Primary Care Provider:  Cassandria Anger, MD Primary Cardiologist:  Kirk Ruths, MD  Chief Complaint    76 year old male with a history of VSD, CAD, aortic valve regurgitation, hypertension, hyperlipidemia, thoracic aortic aneurysm, iliac aneurysms, DVT, IDA, osteoarthritis, BPH and Barrett's esophagus who presents for follow-up related to CAD and hypertension.   Past Medical History    Past Medical History:  Diagnosis Date   Acute hepatitis C without mention of hepatic coma(070.51)    Allergic rhinitis, cause unspecified    Anxiety state, unspecified    Arthritis    Barrett's esophagus    BPH (benign prostatic hyperplasia)    DVT (deep venous thrombosis) (HCC)    Elevated PSA    Dr Karsten Ro   Esophageal reflux    Hiatal hernia    Hyperlipidemia    Hypertension    Hypertonicity of bladder    Insomnia    Iron deficiency anemia 04/30/2022   Iron malabsorption 04/30/2022   Leg DVT (deep venous thromboembolism), chronic, unspecified laterality ([place]) 09/21/2018   Osteoporosis, unspecified    Personal history of urinary calculi    Recent retinal detachment, partial, with giant tear    Unspecified tinnitus    Ventricular septal defect    Past Surgical History:  Procedure Laterality Date   KNEE SURGERY Bilateral    RETINAL DETACHMENT SURGERY     TOE SURGERY Left    great toe    Allergies  Allergies  Allergen Reactions   Oxycodone-Acetaminophen Other (See Comments)    Other Reaction: agitation   Crestor [Rosuvastatin]    Zetia [Ezetimibe]     Brain fog, felt sluggish   Lamisil [Terbinafine] Rash    History of Present Illness    76 year old male with the above past medical history including VSD, CAD, aortic valve regurgitation, hypertension, hyperlipidemia, thoracic aortic aneurysm, iliac aneurysms, DVT, IDA, osteoarthritis, BPH and Barrett's esophagus.  Is a history of  congenital VSD and prior DVT.  Coronary calcium score in June 2020 revealed a calcium score to 27, mildly dilated ascending aorta at 4.3 cm.  Nuclear study in June 2020 showed EF 51%, small fixed apical defect, felt to be artifact, no evidence of ischemia.  Echocardiogram in February 2022 showed normal LV function, small restrictive VSD with left-to-right shunting, mild aortic insufficiency, mildly dilated ascending aorta at 41 mm.  Venous Dopplers in December 2021 showed chronic partially occlusive thrombus within the left superficial femoral and popliteal veins.  Abdominal ultrasound in January 2022 showed no abdominal aortic aneurysm, there was abnormal dilation of the right common iliac artery, left common iliac artery, right external iliac artery and left external iliac artery.  Follow-up study was recommended in 24 months.  He was last seen in the office on 01/10/2022 and was stable from a cardiac standpoint.  He reported back pain and bilateral knee pain.  He thought his symptoms might be due to [place].  Advised to hold Repatha for 8 weeks and if his symptoms did not improve, resumption of Repatha was recommended.  PIs in May 2023 showed noncompressible right lower extremity arteries, no evidence of significant left lower extremity disease.  He presents today for follow-up accompanied by his wife.  Since his last visit he has been stable from a cardiac standpoint.  He denies that he chest pain, dyspnea, edema, PND, orthopnea, weight gain. He is taking Repatha and appears to be tolerating this well.  BP is well controlled.  He and his wife have a myriad of noncardiac concerns related to his history of anemia, Barrett's esophagus, and osteoarthritis.  He is following with rheumatology, GI, and hematology/oncology. Otherwise, he reports feeling well and denies any additional concerns today.  Home Medications    Current Outpatient Medications  Medication Sig Dispense Refill   aspirin 325 MG tablet  aspirin 325 mg tablet  Take 1 tablet every day by oral route.     Carboxymethylcellulose Sodium (EYE DROPS OP) Apply to eye.     cholecalciferol (VITAMIN D) 1000 UNITS tablet Take 1,000 Units by mouth every morning.     Cyanocobalamin (VITAMIN B 12 PO) Take by mouth.     EPINEPHrine (EPIPEN 2-PAK) 0.3 mg/0.3 mL IJ SOAJ injection Inject 0.3 mLs (0.3 mg total) into the muscle as needed for anaphylaxis. 1 Device 3   ferrous sulfate 325 (65 FE) MG tablet Take 1 tablet (325 mg total) by mouth daily with breakfast. 30 tablet 2   glucosamine-chondroitin 500-400 MG tablet Take 2 tablets by mouth every morning.      Lactobacillus (CVS ACIDOPHILUS PO) Take by mouth daily.     Multiple Vitamin (MULTIVITAMIN WITH MINERALS) TABS tablet Take 1 tablet by mouth every morning.     olmesartan (BENICAR) 40 MG tablet TAKE 1 TABLET DAILY 90 tablet 3   olopatadine (PATANOL) 0.1 % ophthalmic solution Place 1 drop into both eyes daily at 6 (six) AM.     OVER THE COUNTER MEDICATION Take 3 capsules by mouth every morning. OTC herbal supplement for prostate health     REPATHA SURECLICK 675 MG/ML SOAJ INJECT ONE PEN (140MG) INTO THE SKIN EVERY 14 DAYS 6 mL 2   No current facility-administered medications for this visit.     Review of Systems    He denies chest pain, palpitations, dyspnea, pnd, orthopnea, n, v, dizziness, syncope, edema, weight gain, or early satiety. All other systems reviewed and are otherwise negative except as noted above.   Physical Exam    VS:  BP 138/65   Pulse 65   Ht 6' 1"  (1.854 m)   Wt 190 lb 6.4 oz (86.4 kg)   SpO2 (!) 65%   BMI 25.12 kg/m   GEN: Well nourished, well developed, in no acute distress. HEENT: normal. Neck: Supple, no JVD, carotid bruits, or masses. Cardiac: RRR, 3/6 murmur, no rubs, or gallops. No clubbing, cyanosis, edema.  Radials/DP/PT 2+ and equal bilaterally.  Respiratory:  Respirations regular and unlabored, clear to auscultation bilaterally. GI: Soft,  nontender, nondistended, BS + x 4. MS: no deformity or atrophy. Skin: warm and dry, no rash. Neuro:  Strength and sensation are intact. Psych: Normal affect.  Accessory Clinical Findings    ECG personally reviewed by me today - No EKG in office today - no acute changes.   Lab Results  Component Value Date   WBC 8.1 06/11/2022   HGB 12.5 (L) 06/11/2022   HCT 40.0 06/11/2022   MCV 84.7 06/11/2022   PLT 275 06/11/2022   Lab Results  Component Value Date   CREATININE 1.07 04/30/2022   BUN 17 04/30/2022   NA 142 04/30/2022   K 4.1 04/30/2022   CL 108 04/30/2022   CO2 27 04/30/2022   Lab Results  Component Value Date   ALT 11 04/30/2022   AST 16 04/30/2022   ALKPHOS 34 (L) 04/30/2022   BILITOT 0.5 04/30/2022   Lab Results  Component Value Date   CHOL 94 (  L) 03/19/2022   HDL 42 03/19/2022   LDLCALC 39 03/19/2022   TRIG 55 03/19/2022   CHOLHDL 2.2 03/19/2022    Lab Results  Component Value Date   HGBA1C 5.8 12/29/2018    Assessment & Plan    1. VSD: Echo in February 2022 showed normal LV function, small restrictive VSD with left-to-right shunting, mild aortic insufficiency, mildly dilated ascending aorta at 41 mm, stable.  Consider repeat echo at follow-up in early 2024.  2. Thoracic aortic aneurysm: CT a in June 2023 showed 4.1 cm ascending thoracic aortic aneurysm. Plan for repeat CT in June 2023.   3. Iliac aneurysms: Abdominal ultrasound in January 2022 showed no abdominal aortic aneurysm, there was abnormal dilation of the right common iliac artery, left common iliac artery, right external iliac artery and left external iliac artery.  Follow-up study was recommended in 24 months (January 2024).  4. CAD: Coronary calcium score in June 2020 revealed a calcium score to 277. Nuclear study in June 2020 showed EF 51%, small fixed apical defect, felt to be artifact, no evidence of ischemia. Stable with no anginal symptoms. No indication for ischemic evaluation.  Continue  aspirin, Repatha.  5. Hypertension: BP well controlled. Continue current antihypertensive regimen.   6. Aortic insufficiency: Mild on most recent echo.  Consider repeat echo in early 2024.  7. Hyperlipidemia: LDL was 39 in 03/2022. Monitored and managed per PCP. Continue Repatha.   8. Disposition: Follow-up in 5 months with Dr. Stanford Breed.       Lenna Sciara, NP 08/01/2022, 12:22 PM

## 2022-08-01 NOTE — Patient Instructions (Signed)
Medication Instructions:  No Changes  *If you need a refill on your cardiac medications before your next appointment, please call your pharmacy*   Lab Work: No Labs  If you have labs (blood work) drawn today and your tests are completely normal, you will receive your results only by: [place] (if you have MyChart) OR A paper copy in the mail If you have any lab test that is abnormal or we need to change your treatment, we will call you to review the results.   Testing/Procedures: No Testing   Follow-Up: At [facility], you and your health needs are our priority.  As part of our continuing mission to provide you with exceptional heart care, we have created designated Provider Care Teams.  These Care Teams include your primary Cardiologist (physician) and Advanced Practice Providers (APPs -  Physician Assistants and Nurse Practitioners) who all work together to provide you with the care you need, when you need it.  We recommend signing up for the patient portal called "MyChart".  Sign up information is provided on this After Visit Summary.  MyChart is used to connect with patients for Virtual Visits (Telemedicine).  Patients are able to view lab/test results, encounter notes, upcoming appointments, etc.  Non-urgent messages can be sent to your provider as well.   To learn more about what you can do with MyChart, go to [email or web address].    Your next appointment:   5 month(s)  The format for your next appointment:   In Person  Provider:   Kirk Ruths, MD

## 2022-08-06 ENCOUNTER — Encounter: Payer: Self-pay | Admitting: Hematology & Oncology

## 2022-08-06 ENCOUNTER — Inpatient Hospital Stay: Payer: Medicare Other | Attending: Hematology & Oncology

## 2022-08-06 ENCOUNTER — Inpatient Hospital Stay (HOSPITAL_BASED_OUTPATIENT_CLINIC_OR_DEPARTMENT_OTHER): Payer: Medicare Other | Admitting: Hematology & Oncology

## 2022-08-06 VITALS — BP 127/79 | HR 69 | Temp 97.9°F | Resp 18 | Ht 71.0 in | Wt 189.5 lb

## 2022-08-06 DIAGNOSIS — Z7982 Long term (current) use of aspirin: Secondary | ICD-10-CM | POA: Diagnosis not present

## 2022-08-06 DIAGNOSIS — I82509 Chronic embolism and thrombosis of unspecified deep veins of unspecified lower extremity: Secondary | ICD-10-CM

## 2022-08-06 DIAGNOSIS — I82502 Chronic embolism and thrombosis of unspecified deep veins of left lower extremity: Secondary | ICD-10-CM | POA: Diagnosis not present

## 2022-08-06 DIAGNOSIS — D6862 Lupus anticoagulant syndrome: Secondary | ICD-10-CM | POA: Diagnosis not present

## 2022-08-06 DIAGNOSIS — Z862 Personal history of diseases of the blood and blood-forming organs and certain disorders involving the immune mechanism: Secondary | ICD-10-CM | POA: Insufficient documentation

## 2022-08-06 DIAGNOSIS — D500 Iron deficiency anemia secondary to blood loss (chronic): Secondary | ICD-10-CM

## 2022-08-06 LAB — FERRITIN: Ferritin: 28 ng/mL (ref 24–336)

## 2022-08-06 LAB — CBC WITH DIFFERENTIAL (CANCER CENTER ONLY)
Abs Immature Granulocytes: 0.01 10*3/uL (ref 0.00–0.07)
Basophils Absolute: 0 10*3/uL (ref 0.0–0.1)
Basophils Relative: 0 %
Eosinophils Absolute: 0.3 10*3/uL (ref 0.0–0.5)
Eosinophils Relative: 4 %
HCT: 45.1 % (ref 39.0–52.0)
Hemoglobin: 14.3 g/dL (ref 13.0–17.0)
Immature Granulocytes: 0 %
Lymphocytes Relative: 34 %
Lymphs Abs: 2.7 10*3/uL (ref 0.7–4.0)
MCH: 26.8 pg (ref 26.0–34.0)
MCHC: 31.7 g/dL (ref 30.0–36.0)
MCV: 84.6 fL (ref 80.0–100.0)
Monocytes Absolute: 0.7 10*3/uL (ref 0.1–1.0)
Monocytes Relative: 9 %
Neutro Abs: 4.1 10*3/uL (ref 1.7–7.7)
Neutrophils Relative %: 53 %
Platelet Count: 247 10*3/uL (ref 150–400)
RBC: 5.33 MIL/uL (ref 4.22–5.81)
RDW: 23.9 % — ABNORMAL HIGH (ref 11.5–15.5)
WBC Count: 7.9 10*3/uL (ref 4.0–10.5)
nRBC: 0 % (ref 0.0–0.2)

## 2022-08-06 LAB — CMP (CANCER CENTER ONLY)
ALT: 14 U/L (ref 0–44)
AST: 17 U/L (ref 15–41)
Albumin: 4.6 g/dL (ref 3.5–5.0)
Alkaline Phosphatase: 33 U/L — ABNORMAL LOW (ref 38–126)
Anion gap: 6 (ref 5–15)
BUN: 20 mg/dL (ref 8–23)
CO2: 29 mmol/L (ref 22–32)
Calcium: 9.7 mg/dL (ref 8.9–10.3)
Chloride: 101 mmol/L (ref 98–111)
Creatinine: 1.04 mg/dL (ref 0.61–1.24)
GFR, Estimated: >60 mL/min (ref >60)
Glucose, Bld: 63 mg/dL — ABNORMAL LOW (ref 70–99)
Potassium: 4.5 mmol/L (ref 3.5–5.1)
Sodium: 136 mmol/L (ref 135–145)
Total Bilirubin: 0.7 mg/dL (ref 0.3–1.2)
Total Protein: 8.2 g/dL — ABNORMAL HIGH (ref 6.5–8.1)

## 2022-08-06 LAB — IRON AND IRON BINDING CAPACITY (CC-WL,HP ONLY)
Iron: 172 ug/dL (ref 45–182)
Saturation Ratios: 49 % — ABNORMAL HIGH (ref 17.9–39.5)
TIBC: 350 ug/dL (ref 250–450)
UIBC: 178 ug/dL (ref 117–376)

## 2022-08-06 LAB — RETICULOCYTES
Immature Retic Fract: 12.3 % (ref 2.3–15.9)
RBC.: 5.34 MIL/uL (ref 4.22–5.81)
Retic Count, Absolute: 52.3 10*3/uL (ref 19.0–186.0)
Retic Ct Pct: 1 % (ref 0.4–3.1)

## 2022-08-06 NOTE — Progress Notes (Signed)
Hematology and Oncology Follow Up Visit  Roger Kent [id number] Jun 16, 1946 76 y.o. 08/06/2022   Principle Diagnosis:  DVT of the right peroneal vein SVT of the right saphenous vein (+) lupus anti-coagulant Iron deficiency anemia  Current Therapy:   Xarelto 20 mg po q day -- 1 yr of therapeutic to complete in 05/2019 Xarelto 10 mg po q day -- maintenance x 3yr-- start 05/2019 -- completed on 06/2020 EC ASA 81 mg po q day -- started on 01/11/2019 -- changed to 325 mg po q day on 06/05/2020 IV iron-Monoferric given on 05/03/2022     Interim History:  Roger Kent back for follow-up.  He has responded very well to the IV iron.  He is on oral iron right now.  He did not undergo upper endoscopy and colonoscopy.  This was done a few weeks ago.  There is no masses.  He had biopsies that were taken.  He had diverticuli.  There is no active bleeding.  I am just happy that his low iron got resolved.  His hemoglobin is normal now.  He is still taking the aspirin daily.  He takes it in the morning with food.  He has had no problems with melena or hematochezia.  Has had no problems with rashes.  He had a little bit of leg swelling over on the left lower leg which is chronic.  There is no chest wall pain.  He has had no cough or shortness of breath.  Overall, I would say his performance status is probably ECOG 1.    Medications:  Current Outpatient Medications:    aspirin 325 MG tablet, aspirin 325 mg tablet  Take 1 tablet every day by oral route., Disp: , Rfl:    Carboxymethylcellulose Sodium (EYE DROPS OP), Apply to eye., Disp: , Rfl:    cholecalciferol (VITAMIN D) 1000 UNITS tablet, Take 1,000 Units by mouth every morning., Disp: , Rfl:    Cyanocobalamin (VITAMIN B 12 PO), Take by mouth., Disp: , Rfl:    Digestive Aids Mixture (DIGESTION GB PO), Take by mouth., Disp: , Rfl:    ferrous sulfate 325 (65 FE) MG tablet, Take 1 tablet (325 mg total) by mouth daily with  breakfast., Disp: 30 tablet, Rfl: 2   glucosamine-chondroitin 500-400 MG tablet, Take 2 tablets by mouth every morning. , Disp: , Rfl:    Lactobacillus ([employer] ACIDOPHILUS PO), Take by mouth daily., Disp: , Rfl:    Multiple Vitamin (MULTIVITAMIN WITH MINERALS) TABS tablet, Take 1 tablet by mouth every morning., Disp: , Rfl:    NON FORMULARY, Advanced Bladder- [employer], Disp: , Rfl:    olmesartan (BENICAR) 40 MG tablet, TAKE 1 TABLET DAILY, Disp: 90 tablet, Rfl: 3   olopatadine (PATANOL) 0.1 % ophthalmic solution, Place 1 drop into both eyes daily at 6 (six) AM., Disp: , Rfl:    OVER THE COUNTER MEDICATION, Take 3 capsules by mouth every morning. OTC herbal supplement for prostate health, Disp: , Rfl:    EPINEPHrine (EPIPEN 2-PAK) 0.3 mg/0.3 mL IJ SOAJ injection, Inject 0.3 mLs (0.3 mg total) into the muscle as needed for anaphylaxis. (Patient not taking: Reported on 08/06/2022), Disp: 1 Device, Rfl: 3   REPATHA SURECLICK 1834MG/ML SOAJ, INJECT ONE PEN ('140MG'$ ) INTO THE SKIN EVERY 14 DAYS, Disp: 6 mL, Rfl: 2  Allergies:  Allergies  Allergen Reactions   Oxycodone-Acetaminophen Other (See Comments)    Other Reaction: agitation   Crestor [Rosuvastatin]    Zetia [Ezetimibe]  Brain fog, felt sluggish   Lamisil [Terbinafine] Rash    Past Medical History, Surgical history, Social history, and Family History were reviewed and updated.  Review of Systems: Review of Systems  Constitutional: Negative.   HENT:  Negative.    Eyes: Negative.   Respiratory: Negative.    Cardiovascular: Negative.   Gastrointestinal: Negative.   Endocrine: Negative.   Genitourinary: Negative.    Musculoskeletal: Negative.   Skin: Negative.   Neurological: Negative.   Hematological: Negative.   Psychiatric/Behavioral: Negative.      Physical Exam:  height is '5\' 11"'$  (1.803 m) and weight is 189 lb 8 oz (86 kg). His oral temperature is 97.9 F (36.6 C). His blood pressure is 127/79 and his pulse is 69. His  respiration is 18 and oxygen saturation is 98%.   Wt Readings from Last 3 Encounters:  08/06/22 189 lb 8 oz (86 kg)  08/01/22 190 lb 6.4 oz (86.4 kg)  07/10/22 179 lb (81.2 kg)    Physical Exam Vitals reviewed.  HENT:     Head: Normocephalic and atraumatic.  Eyes:     Pupils: Pupils are equal, round, and reactive to light.  Cardiovascular:     Rate and Rhythm: Normal rate and regular rhythm.     Heart sounds: Normal heart sounds.  Pulmonary:     Effort: Pulmonary effort is normal.     Breath sounds: Normal breath sounds.  Abdominal:     General: Bowel sounds are normal.     Palpations: Abdomen is soft.  Genitourinary:    Comments: Rectal exam showed no external hemorrhoids.  He had no mass in the rectal vault.  His stool was brown and heme negative. Musculoskeletal:        General: No tenderness or deformity. Normal range of motion.     Cervical back: Normal range of motion.  Lymphadenopathy:     Cervical: No cervical adenopathy.  Skin:    General: Skin is warm and dry.     Findings: No erythema or rash.  Neurological:     Mental Status: He is alert and oriented to person, place, and time.  Psychiatric:        Behavior: Behavior normal.        Thought Content: Thought content normal.        Judgment: Judgment normal.      Lab Results  Component Value Date   WBC 7.9 08/06/2022   HGB 14.3 08/06/2022   HCT 45.1 08/06/2022   MCV 84.6 08/06/2022   PLT 247 08/06/2022     Chemistry      Component Value Date/Time   NA 136 08/06/2022 0927   NA 140 05/10/2021 0842   K 4.5 08/06/2022 0927   CL 101 08/06/2022 0927   CO2 29 08/06/2022 0927   BUN 20 08/06/2022 0927   BUN 15 05/10/2021 0842   CREATININE 1.04 08/06/2022 0927   CREATININE 0.98 08/01/2020 0924      Component Value Date/Time   CALCIUM 9.7 08/06/2022 0927   ALKPHOS 33 (L) 08/06/2022 0927   AST 17 08/06/2022 0927   ALT 14 08/06/2022 0927   BILITOT 0.7 08/06/2022 0927       Impression and  Plan: Roger Kent is a 76 year old white male.  He had a thrombus in the left leg about 6 years ago.  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  I suspect he will always have this.  I do not see any indication that we  have to do another Doppler.  I had believe that the iron levels can be fine now.  His hemoglobin is, quite nicely.  He is followed by Gastroenterology.  I do they want to see him back in 3 years.  I will see him back in 3 months.  If all is good in 3 months, then maybe we can get him through all of the Winter.    Volanda Napoleon, MD 9/19/202311:03 AM

## 2022-08-08 ENCOUNTER — Ambulatory Visit (INDEPENDENT_AMBULATORY_CARE_PROVIDER_SITE_OTHER): Payer: Medicare Other

## 2022-08-08 DIAGNOSIS — Z23 Encounter for immunization: Secondary | ICD-10-CM | POA: Diagnosis not present

## 2022-08-08 NOTE — Progress Notes (Addendum)
After obtaining consent, and per orders of Dr. Alain Marion, injection of Flu given in the right deltoid by Marrian Salvage. Patient tolerated well and instructed to report any adverse reaction to me immediately.   Medical screening examination/treatment/procedure(s) were performed by non-physician practitioner and as supervising physician I was immediately available for consultation/collaboration.  I agree with above. Lew Dawes, MD

## 2022-08-19 ENCOUNTER — Other Ambulatory Visit: Payer: Self-pay | Admitting: Cardiology

## 2022-08-19 DIAGNOSIS — I2510 Atherosclerotic heart disease of native coronary artery without angina pectoris: Secondary | ICD-10-CM

## 2022-08-19 DIAGNOSIS — E7800 Pure hypercholesterolemia, unspecified: Secondary | ICD-10-CM

## 2022-08-20 DIAGNOSIS — N390 Urinary tract infection, site not specified: Secondary | ICD-10-CM | POA: Diagnosis not present

## 2022-08-20 DIAGNOSIS — G894 Chronic pain syndrome: Secondary | ICD-10-CM | POA: Diagnosis not present

## 2022-08-20 DIAGNOSIS — M545 Low back pain, unspecified: Secondary | ICD-10-CM | POA: Diagnosis not present

## 2022-08-21 ENCOUNTER — Other Ambulatory Visit: Payer: Self-pay | Admitting: Pharmacist

## 2022-08-23 ENCOUNTER — Other Ambulatory Visit: Payer: Self-pay | Admitting: Cardiology

## 2022-08-23 DIAGNOSIS — I2510 Atherosclerotic heart disease of native coronary artery without angina pectoris: Secondary | ICD-10-CM

## 2022-08-23 DIAGNOSIS — E7800 Pure hypercholesterolemia, unspecified: Secondary | ICD-10-CM

## 2022-09-04 ENCOUNTER — Telehealth: Payer: Self-pay | Admitting: Internal Medicine

## 2022-09-04 DIAGNOSIS — H9193 Unspecified hearing loss, bilateral: Secondary | ICD-10-CM

## 2022-09-04 NOTE — Telephone Encounter (Signed)
Pt wife came in and stated that pt needs a referral for a hearing test in order to get new hearing aids. Referral should be sent to [facility], [phone].  Best contact for pt [phone].

## 2022-09-05 NOTE — Telephone Encounter (Signed)
Okay.  Thanks.

## 2022-09-05 NOTE — Telephone Encounter (Signed)
Notified pt w/ MD response../,lmb 

## 2022-09-10 ENCOUNTER — Encounter: Payer: Self-pay | Admitting: Internal Medicine

## 2022-09-10 ENCOUNTER — Ambulatory Visit (INDEPENDENT_AMBULATORY_CARE_PROVIDER_SITE_OTHER): Payer: Medicare Other | Admitting: Internal Medicine

## 2022-09-10 VITALS — BP 130/68 | HR 88 | Ht 71.0 in | Wt 192.6 lb

## 2022-09-10 DIAGNOSIS — K219 Gastro-esophageal reflux disease without esophagitis: Secondary | ICD-10-CM

## 2022-09-10 DIAGNOSIS — K2270 Barrett's esophagus without dysplasia: Secondary | ICD-10-CM | POA: Diagnosis not present

## 2022-09-10 MED ORDER — ESOMEPRAZOLE MAGNESIUM 20 MG PO CPDR: 20.0000 mg | DELAYED_RELEASE_CAPSULE | Freq: Every day | ORAL | 1 refills | Status: DC

## 2022-09-10 NOTE — Progress Notes (Signed)
Chief Complaint: Iron deficiency anemia  HPI:    [name] is a 76 year old male with history of hepatitis C (last checked as undetectable), Barrett's, DVT (01/04/2020 echo with an LVEF 60-65%), iron deficiency anemia presents for follow up of IDA, GERD and acid reflux.  Interval History: He has been doing well. Uses a generic antacid as well as Tums PRN to helps with acid reflux. He has also been using diluted apple cider vinegar to help with GERD as well. He used to be on Zegerid but has stopped that medication due to concerns about side effects. He is still on an oral iron supplement.   Current Outpatient Medications  Medication Sig Dispense Refill   aspirin 325 MG tablet aspirin 325 mg tablet  Take 1 tablet every day by oral route.     Carboxymethylcellulose Sodium (EYE DROPS OP) Apply to eye.     cholecalciferol (VITAMIN D) 1000 UNITS tablet Take 1,000 Units by mouth every morning.     Cyanocobalamin (VITAMIN B 12 PO) Take by mouth.     Digestive Aids Mixture (DIGESTION GB PO) Take by mouth.     EPINEPHrine (EPIPEN 2-PAK) 0.3 mg/0.3 mL IJ SOAJ injection Inject 0.3 mLs (0.3 mg total) into the muscle as needed for anaphylaxis. 1 Device 3   Evolocumab (REPATHA SURECLICK) 979 MG/ML SOAJ inject 140 mg into the skin once every 14 days 6 mL 3   ferrous sulfate 325 (65 FE) MG tablet Take 1 tablet (325 mg total) by mouth daily with breakfast. 30 tablet 2   glucosamine-chondroitin 500-400 MG tablet Take 2 tablets by mouth every morning.      Lactobacillus ([employer]S ACIDOPHILUS PO) Take by mouth daily.     Multiple Vitamin (MULTIVITAMIN WITH MINERALS) TABS tablet Take 1 tablet by mouth every morning.     NON FORMULARY Advanced Bladder- [employer]     olmesartan (BENICAR) 40 MG tablet TAKE 1 TABLET DAILY 90 tablet 3   olopatadine (PATANOL) 0.1 % ophthalmic solution Place 1 drop into both eyes daily at 6 (six) AM.     OVER THE COUNTER MEDICATION Take 3 capsules by mouth every morning. OTC herbal  supplement for prostate health     No current facility-administered medications for this visit.    Physical Exam:  Vital signs: BP 130/68   Pulse 88   Ht '5\' 11"'$  (1.803 m)   Wt 192 lb 9.6 oz (87.4 kg)   SpO2 91%   BMI 26.86 kg/m    Constitutional:   Pleasant Caucasian male appears to be in NAD, Well developed, Well nourished, alert and cooperative HEENT:  Normocephalic and atraumatic. Respiratory: No increased WOB Cardiovascular:  Regular rate Msk:  Symmetrical without gross deformities. Without edema, no deformity or joint abnormality.  Neurologic:  Alert and  oriented Psychiatric:  Demonstrates good judgement and reason without abnormal affect or behaviors.  RELEVANT LABS AND IMAGING: CBC    Component Value Date/Time   WBC 7.9 08/06/2022 0927   WBC 5.3 08/01/2020 0925   RBC 5.33 08/06/2022 0927   RBC 5.34 08/06/2022 0927   HGB 14.3 08/06/2022 0927   HCT 45.1 08/06/2022 0927   PLT 247 08/06/2022 0927   MCV 84.6 08/06/2022 0927   MCH 26.8 08/06/2022 0927   MCHC 31.7 08/06/2022 0927   RDW 23.9 (H) 08/06/2022 0927   LYMPHSABS 2.7 08/06/2022 0927   MONOABS 0.7 08/06/2022 0927   EOSABS 0.3 08/06/2022 0927   BASOSABS 0.0 08/06/2022 0927    CMP  Component Value Date/Time   NA 136 08/06/2022 0927   NA 140 05/10/2021 0842   K 4.5 08/06/2022 0927   CL 101 08/06/2022 0927   CO2 29 08/06/2022 0927   GLUCOSE 63 (L) 08/06/2022 0927   GLUCOSE 125 (H) 10/17/2006 1200   BUN 20 08/06/2022 0927   BUN 15 05/10/2021 0842   CREATININE 1.04 08/06/2022 0927   CREATININE 0.98 08/01/2020 0924   CALCIUM 9.7 08/06/2022 0927   PROT 8.2 (H) 08/06/2022 0927   PROT 6.8 03/19/2022 0924   ALBUMIN 4.6 08/06/2022 0927   ALBUMIN 4.1 03/19/2022 0924   AST 17 08/06/2022 0927   ALT 14 08/06/2022 0927   ALKPHOS 33 (L) 08/06/2022 0927   BILITOT 0.7 08/06/2022 0927   GFRNONAA >60 08/06/2022 0927   GFRNONAA 76 08/01/2020 0924   GFRAA 88 08/01/2020 0924   05/20/2013 colonoscopy with Dr.  Earlean Shawl with diverticulosis and otherwise normal.  EGD 07/10/22: - Salmon-colored mucosa classified as Barrett's stage C1-M1 per [place] criteria. Biopsied. - 2 cm hiatal hernia. - Gastritis. Biopsied. - Normal examined duodenum. Biopsied. Path: 1. Surgical [P], 2nd portion of duodenum - DUODENAL MUCOSA SHOWING PATCHY LYMPHANGIECTASIA - NEGATIVE FOR INCREASED INTRAEPITHELIAL LYMPHOCYTES OR VILLOUS ARCHITECTURAL CHANGES 2. Surgical [P], gastric antrum and gastric body - GASTRIC ANTRAL AND OXYNTIC MUCOSA WITH MILD CHRONIC GASTRITIS, SEE NOTE 3. Surgical [P], GE junction - BARRETTS ESOPHAGUS, NEGATIVE FOR DYSPLASIA 2. Immunohistochemical stain for Helicobacter pylori is negative.  Colonoscopy 07/10/22: - The terminal ileum appeared normal. - Multiple small and large-mouthed diverticula were found in the sigmoid colon, descending colon, transverse colon and ascending colon. - Non-bleeding internal hemorrhoids were found during retroflexion.  Assessment: Iron deficiency anemia - Resolved Barrett's esophagus GERD Patient has been doing well. He has stopped PPI therapy and has been using PRNs antacids to help keep his reflux under control. I did go over the diagnosis of Barrett's esophagus with the patient, and recommended that he restart a daily low dose PPI in order to reduce his risk of progression of Barrett's esophagus. His recent labs suggest that his IDA has resolved. I did tell the patient that he could stop his daily iron supplement and transition to daily multivitamin that contains some iron, but at this time he prefers to continue the daily oral iron supplement.  - GERD handout. Will give information on Barrett's esophagus - Start Nexium 20 mg QD - Okay to continue using PRN antacids - Next EGD is due in 06/2025 - RTC in 6 months  [name], MD [facility] 09/10/2022, 8:51 AM  I spent 42 minutes of time, including in depth chart review, independent review of  results as outlined above, communicating results with the patient directly, face-to-face time with the patient, coordinating care, ordering studies and medications as appropriate, and documentation.

## 2022-09-10 NOTE — Patient Instructions (Signed)
_______________________________________________________  If you are age 76 or older, your body mass index should be between 23-30. Your Body mass index is 26.86 kg/m. If this is out of the aforementioned range listed, please consider follow up with your Primary Care Provider. ________________________________________________________  The [facility] providers would like to encourage you to use [facility] to communicate with providers for non-urgent requests or questions.  Due to long hold times on the telephone, sending your provider a message by [facility] may be a faster and more efficient way to get a response.  Please allow 48 business hours for a response.  Please remember that this is for non-urgent requests.  _______________________________________________________  We have sent the following medications to your pharmacy for you to pick up at your convenience:  START: Nexium '20mg'$  once daily.  Barrett's Esophagus  Barrett's esophagus occurs when the tissue that lines the esophagus changes or becomes damaged. The esophagus is the tube that carries food from the throat to the stomach. With Barrett's esophagus, the cells that line the esophagus are replaced by cells that are similar to the lining of the intestines (intestinal metaplasia). Barrett's esophagus itself may not cause any symptoms. However, many people who have Barrett's esophagus also have gastroesophageal reflux disease (GERD), which may cause symptoms such as heartburn. Over time, a few people with this condition may develop cancer of the esophagus. Treatment may include medicines, procedures to destroy the abnormal cells, or surgery. What are the causes? The exact cause of this condition is not known. In some cases, the condition develops from damage to the lining of the esophagus caused by gastroesophageal reflux disease (GERD). GERD occurs when stomach acids flow up from the stomach into the esophagus. Frequent symptoms of GERD may cause  intestinal metaplasia or cause cell changes (dysplasia). What increases the risk? You are more likely to develop this condition if you: Have GERD. Are male. Are of European descent. Are obese. Are older than 50. Have a hiatal hernia. This is a condition in which part of your stomach bulges into your chest. Smoke. What are the signs or symptoms? People with Barrett's esophagus often have no symptoms. However, many people with this condition also have GERD. Symptoms of GERD may include: Heartburn. Difficulty swallowing. Dry cough. How is this diagnosed? This condition may be diagnosed based on: Results of an upper gastrointestinal endoscopy. For this exam, a thin, flexible tube with a light and a camera on the end (endoscope) is passed down your esophagus. Your health care provider can view the inside of your esophagus during this procedure. Results of a biopsy. For this procedure, several tissue samples are removed (biopsy) from your esophagus to look at under a microscope. They are then checked for intestinal metaplasia or dysplasia. How is this treated? Treatment for this condition may include: Medicines (proton pump inhibitors, or PPIs) to decrease or stop GERD. Periodic endoscopic exams to make sure that cancer is not developing. A procedure or surgery for dysplasia. This may include: Removal or destruction of abnormal cells. Removal of part of the esophagus. Follow these instructions at home: Eating and drinking Eat more fruits and vegetables. Avoid fatty foods. Eat small, frequent meals instead of large meals. Avoid foods that cause heartburn. These foods include: Coffee and alcoholic drinks. Tomatoes and foods made with tomatoes. Greasy or spicy foods. Chocolate and peppermint. Do not drink alcohol. General instructions Take over-the-counter and prescription medicines only as told by your health care provider. Do not use any products that contain nicotine  or tobacco, such  as cigarettes, e-cigarettes, and chewing tobacco. If you need help quitting, ask your health care provider. If you are being treated for GERD, make sure you take medicines and follow all instructions as told by your health care provider. Keep all follow-up visits as told by your health care provider. This is important. Contact a health care provider if: You have heartburn or GERD symptoms. You have difficulty swallowing. Get help right away if: You have chest pain. You are unable to swallow. You vomit blood or material that looks like coffee grounds. Your stool (feces) is bright red or dark. These symptoms may represent a serious problem that is an emergency. Do not wait to see if the symptoms will go away. Get medical help right away. Call your local emergency services (911 in the [place] Do not drive yourself to the hospital. Summary Barrett's esophagus occurs when the tissue that lines the esophagus changes or becomes damaged. Barrett's esophagus may be diagnosed with an upper gastrointestinal endoscopy and a biopsy. Treatment may include medicines, procedures to remove abnormal cells, or surgery. Follow your health care provider's instructions about what to eat and drink, what medicines to take, and when to call for help. This information is not intended to replace advice given to you by your health care provider. Make sure you discuss any questions you have with your health care provider. Document Revised: 01/22/2020 Document Reviewed: 01/22/2020 Elsevier Patient Education  [place] will need a follow up in 6 months (April 2024).  We will contact you to schedule this appointment.  Thank you for entrusting me with your care and choosing [facility].  Dr Lorenso Courier

## 2022-09-17 ENCOUNTER — Ambulatory Visit: Payer: Medicare Other | Admitting: Rheumatology

## 2022-09-19 ENCOUNTER — Ambulatory Visit: Payer: Medicare Other

## 2022-09-19 DIAGNOSIS — H9313 Tinnitus, bilateral: Secondary | ICD-10-CM | POA: Diagnosis not present

## 2022-09-19 DIAGNOSIS — Z822 Family history of deafness and hearing loss: Secondary | ICD-10-CM | POA: Diagnosis not present

## 2022-09-19 DIAGNOSIS — H903 Sensorineural hearing loss, bilateral: Secondary | ICD-10-CM | POA: Diagnosis not present

## 2022-09-19 DIAGNOSIS — Z570 Occupational exposure to noise: Secondary | ICD-10-CM | POA: Diagnosis not present

## 2022-09-23 ENCOUNTER — Ambulatory Visit: Payer: Medicare Other | Admitting: Internal Medicine

## 2022-09-23 ENCOUNTER — Telehealth: Payer: Self-pay | Admitting: Internal Medicine

## 2022-09-23 MED ORDER — ESOMEPRAZOLE MAGNESIUM 20 MG PO CPDR: 20.0000 mg | DELAYED_RELEASE_CAPSULE | Freq: Every day | ORAL | 2 refills | Status: DC

## 2022-09-23 NOTE — Telephone Encounter (Signed)
Rx sent to pharmacy as requested for Nexium.

## 2022-09-23 NOTE — Telephone Encounter (Signed)
Patient called requested a refill on the Nexium said its been working well for him. Also requested a 90 day supply to be sent to [facility].

## 2022-09-26 ENCOUNTER — Ambulatory Visit (INDEPENDENT_AMBULATORY_CARE_PROVIDER_SITE_OTHER): Payer: Medicare Other | Admitting: Internal Medicine

## 2022-09-26 ENCOUNTER — Encounter: Payer: Self-pay | Admitting: Internal Medicine

## 2022-09-26 VITALS — BP 130/78 | HR 73 | Temp 98.0°F | Ht 71.0 in | Wt 192.0 lb

## 2022-09-26 DIAGNOSIS — M170 Bilateral primary osteoarthritis of knee: Secondary | ICD-10-CM

## 2022-09-26 DIAGNOSIS — R972 Elevated prostate specific antigen [PSA]: Secondary | ICD-10-CM | POA: Diagnosis not present

## 2022-09-26 DIAGNOSIS — I10 Essential (primary) hypertension: Secondary | ICD-10-CM | POA: Diagnosis not present

## 2022-09-26 DIAGNOSIS — I2510 Atherosclerotic heart disease of native coronary artery without angina pectoris: Secondary | ICD-10-CM

## 2022-09-26 DIAGNOSIS — I2583 Coronary atherosclerosis due to lipid rich plaque: Secondary | ICD-10-CM

## 2022-09-26 DIAGNOSIS — K909 Intestinal malabsorption, unspecified: Secondary | ICD-10-CM

## 2022-09-26 DIAGNOSIS — K2270 Barrett's esophagus without dysplasia: Secondary | ICD-10-CM

## 2022-09-26 DIAGNOSIS — M810 Age-related osteoporosis without current pathological fracture: Secondary | ICD-10-CM

## 2022-09-26 DIAGNOSIS — E785 Hyperlipidemia, unspecified: Secondary | ICD-10-CM

## 2022-09-26 DIAGNOSIS — N183 Chronic kidney disease, stage 3 unspecified: Secondary | ICD-10-CM | POA: Diagnosis not present

## 2022-09-26 NOTE — Assessment & Plan Note (Signed)
Pt had IV iron

## 2022-09-26 NOTE — Assessment & Plan Note (Signed)
On Benicar

## 2022-09-26 NOTE — Assessment & Plan Note (Signed)
On Pravastatin 

## 2022-09-26 NOTE — Assessment & Plan Note (Addendum)
B OA - f/u w/Ortho Try Vit D3 and K2+MK7

## 2022-09-26 NOTE — Assessment & Plan Note (Signed)
EGD/colon pending - Dr Lorenso Courier On Nexium  Potential benefits of a long term PPI use as well as potential risks  and complications were explained to the patient and were aknowledged.

## 2022-09-26 NOTE — Assessment & Plan Note (Signed)
Pravastatin, Xarelto

## 2022-09-26 NOTE — Assessment & Plan Note (Signed)
Improve fluid intake

## 2022-09-26 NOTE — Assessment & Plan Note (Signed)
Strength training Vit D3 and Vit K2+MK7 Prolia q 6 mo

## 2022-09-26 NOTE — Progress Notes (Signed)
Subjective:  Patient ID: Roger Kent, male    DOB: [birth year]  Age: 76 y.o. MRN: [id number]  CC: Follow-up (3 MONTH F/U)   HPI Roger Kent presents for anemia, osteoporosis, Barrett's Roger Kent is doing well, no active complaints  Outpatient Medications Prior to Visit  Medication Sig Dispense Refill   aspirin 325 MG tablet aspirin 325 mg tablet  Take 1 tablet every day by oral route.     Carboxymethylcellulose Sodium (EYE DROPS OP) Apply to eye.     cholecalciferol (VITAMIN D) 1000 UNITS tablet Take 1,000 Units by mouth every morning.     Cyanocobalamin (VITAMIN B 12 PO) Take by mouth.     Digestive Aids Mixture (DIGESTION GB PO) Take by mouth. (Patient not taking: Reported on 11/06/2022)     EPINEPHrine (EPIPEN 2-PAK) 0.3 mg/0.3 mL IJ SOAJ injection Inject 0.3 mLs (0.3 mg total) into the muscle as needed for anaphylaxis. 1 Device 3   esomeprazole (NEXIUM) 20 MG capsule Take 1 capsule (20 mg total) by mouth daily at 12 noon. 90 capsule 2   Evolocumab (REPATHA SURECLICK) 272 MG/ML SOAJ inject 140 mg into the skin once every 14 days 6 mL 3   ferrous sulfate 325 (65 FE) MG tablet Take 1 tablet (325 mg total) by mouth daily with breakfast. 30 tablet 2   glucosamine-chondroitin 500-400 MG tablet Take 2 tablets by mouth every morning.      Lactobacillus ([facility]S ACIDOPHILUS PO) Take by mouth daily.     Multiple Vitamin (MULTIVITAMIN WITH MINERALS) TABS tablet Take 1 tablet by mouth every morning.     NON FORMULARY Advanced Bladder- [employer]     olopatadine (PATANOL) 0.1 % ophthalmic solution Place 1 drop into both eyes daily at 6 (six) AM.     OVER THE COUNTER MEDICATION Take 3 capsules by mouth every morning. OTC herbal supplement for prostate health     olmesartan (BENICAR) 40 MG tablet TAKE 1 TABLET DAILY 90 tablet 3   No facility-administered medications prior to visit.    ROS: Review of Systems  Constitutional:  Negative for appetite change, fatigue and unexpected weight  change.  HENT:  Negative for congestion, nosebleeds, sneezing, sore throat and trouble swallowing.   Eyes:  Negative for itching and visual disturbance.  Respiratory:  Negative for cough.   Cardiovascular:  Negative for chest pain, palpitations and leg swelling.  Gastrointestinal:  Negative for abdominal distention, blood in stool, diarrhea and nausea.  Genitourinary:  Negative for frequency and hematuria.  Musculoskeletal:  Negative for back pain, gait problem, joint swelling and neck pain.  Skin:  Negative for rash.  Neurological:  Negative for dizziness, tremors, speech difficulty and weakness.  Psychiatric/Behavioral:  Negative for agitation, dysphoric mood and sleep disturbance. The patient is not nervous/anxious.     Objective:  BP 130/78 (BP Location: Left Arm)   Pulse 73   Temp 98 F (36.7 C) (Oral)   Ht '5\' 11"'$  (1.803 m)   Wt 192 lb (87.1 kg)   SpO2 95%   BMI 26.78 kg/m   BP Readings from Last 3 Encounters:  11/06/22 (!) 150/87  11/05/22 138/82  09/26/22 130/78    Wt Readings from Last 3 Encounters:  11/06/22 192 lb 12.8 oz (87.5 kg)  11/05/22 192 lb (87.1 kg)  09/26/22 192 lb (87.1 kg)    Physical Exam Constitutional:      General: He is not in acute distress.    Appearance: He is well-developed.  Comments: NAD  Eyes:     Conjunctiva/sclera: Conjunctivae normal.     Pupils: Pupils are equal, round, and reactive to light.  Neck:     Thyroid: No thyromegaly.     Vascular: No JVD.  Cardiovascular:     Rate and Rhythm: Normal rate and regular rhythm.     Heart sounds: Normal heart sounds. No murmur heard.    No friction rub. No gallop.  Pulmonary:     Effort: Pulmonary effort is normal. No respiratory distress.     Breath sounds: Normal breath sounds. No wheezing or rales.  Chest:     Chest wall: No tenderness.  Abdominal:     General: Bowel sounds are normal. There is no distension.     Palpations: Abdomen is soft. There is no mass.     Tenderness:  There is no abdominal tenderness. There is no guarding or rebound.  Musculoskeletal:        General: No tenderness. Normal range of motion.     Cervical back: Normal range of motion.  Lymphadenopathy:     Cervical: No cervical adenopathy.  Skin:    General: Skin is warm and dry.     Findings: No rash.  Neurological:     Mental Status: He is alert and oriented to person, place, and time.     Cranial Nerves: No cranial nerve deficit.     Motor: No abnormal muscle tone.     Coordination: Coordination normal.     Gait: Gait normal.     Deep Tendon Reflexes: Reflexes are normal and symmetric.  Psychiatric:        Behavior: Behavior normal.        Thought Content: Thought content normal.        Judgment: Judgment normal.     Lab Results  Component Value Date   WBC 7.0 11/05/2022   HGB 14.7 11/05/2022   HCT 45.2 11/05/2022   PLT 232 11/05/2022   GLUCOSE 98 11/05/2022   CHOL 94 (L) 03/19/2022   TRIG 55 03/19/2022   HDL 42 03/19/2022   LDLCALC 39 03/19/2022   ALT 15 11/05/2022   AST 17 11/05/2022   NA 135 11/05/2022   K 4.4 11/05/2022   CL 100 11/05/2022   CREATININE 0.98 11/05/2022   BUN 15 11/05/2022   CO2 28 11/05/2022   TSH 3.03 04/16/2022   PSA 32.27 (H) 03/19/2022   INR 2.4 RATIO (H) 09/17/2007   HGBA1C 5.8 12/29/2018    CT ANGIO CHEST AORTA W &/OR WO CONTRAST  Result Date: 05/13/2022 CLINICAL DATA:  Follow-up thoracic aortic aneurysm. EXAM: CT ANGIOGRAPHY CHEST WITH CONTRAST TECHNIQUE: Multidetector CT imaging of the chest was performed using the standard protocol during bolus administration of intravenous contrast. Multiplanar CT image reconstructions and MIPs were obtained to evaluate the vascular anatomy. RADIATION DOSE REDUCTION: This exam was performed according to the departmental dose-optimization program which includes automated exposure control, adjustment of the mA and/or kV according to patient size and/or use of iterative reconstruction technique. CONTRAST:   98m ISOVUE-370 IOPAMIDOL (ISOVUE-370) INJECTION 76% COMPARISON:  05/14/2021 FINDINGS: Cardiovascular: The ascending thoracic aorta continues to measure 4.1 cm in maximum diameter. Minimal aortic and coronary artery calcification. Borderline enlarged heart. Borderline enlarged central pulmonary arteries. Mediastinum/Nodes: No enlarged mediastinal, hilar, or axillary lymph nodes. Normal sized calcified left hilar lymph nodes. Thyroid gland, trachea, and esophagus demonstrate no significant findings. Lungs/Pleura: Bibasilar linear scarring without significant change. Stable left lower lobe calcified granuloma. No airspace consolidation  or pleural fluid. Upper Abdomen: Mild diffuse low density of the liver. Small calcified splenic granulomata. Bilateral simple appearing renal cysts. Musculoskeletal: Moderate thoracolumbar scoliosis. Thoracic and lower cervical spine degenerative changes. Review of the MIP images confirms the above findings. IMPRESSION: 1. Stable 4.1 cm ascending thoracic aortic aneurysm. Recommend annual imaging followup by CTA or MRA. This recommendation follows 2010 ACCF/AHA/AATS/ACR/ASA/SCA/SCAI/SIR/STS/SVM Guidelines for the Diagnosis and Management of Patients with Thoracic Aortic Disease. Circulation. 2010; 121: F354-T625. Aortic aneurysm NOS (ICD10-I71.9) 2. Minimal calcific coronary artery and aortic atherosclerosis. 3. Borderline enlarged central pulmonary arteries. This could be an indication of mild pulmonary arterial hypertension. 4. Previous granulomatous infection. 5. Mild diffuse hepatic steatosis. Aortic Atherosclerosis (ICD10-I70.0). Electronically Signed   By: Claudie Revering M.D.   On: 05/13/2022 16:19    Assessment & Plan:   Problem List Items Addressed This Visit     Osteoporosis    Strength training Vit D3 and Vit K2+MK7 Prolia q 6 mo      Knee osteoarthritis    B OA - f/u w/Ortho Try Vit D3 and K2+MK7      Iron malabsorption (Chronic)    Pt had IV iron       Essential hypertension    On Benicar      Elevated PSA, greater than or equal to 20 ng/ml    Urology f/u  - Dr Louis Meckel      Dyslipidemia    On Pravastatin      RESOLVED: CRI (chronic renal insufficiency), stage 3 (moderate) (HCC) - Primary    Improve fluid intake      Coronary artery disease    Pravastatin, Xarelto      Barrett esophagus    EGD/colon pending - Dr Lorenso Courier On Nexium  Potential benefits of a long term PPI use as well as potential risks  and complications were explained to the patient and were aknowledged.          No orders of the defined types were placed in this encounter.     Follow-up: Return in about 6 months (around 03/27/2023) for a follow-up visit.  Walker Kehr, MD

## 2022-09-26 NOTE — Patient Instructions (Signed)
Look at Vit D3 and Vit K2+MK7

## 2022-09-26 NOTE — Assessment & Plan Note (Signed)
Urology f/u  - Dr Louis Meckel

## 2022-09-30 ENCOUNTER — Telehealth: Payer: Self-pay | Admitting: *Deleted

## 2022-09-30 NOTE — Telephone Encounter (Signed)
Pt had ov on 09/26/22.Marland Kitchen He was wanting a prolia injection. Insurance have not been verified. Pls verify and I will call pt to get set-up for appt.Marland KitchenJohny Chess

## 2022-10-02 ENCOUNTER — Encounter: Payer: Self-pay | Admitting: Internal Medicine

## 2022-10-07 ENCOUNTER — Telehealth: Payer: Self-pay | Admitting: Internal Medicine

## 2022-10-07 DIAGNOSIS — N183 Chronic kidney disease, stage 3 unspecified: Secondary | ICD-10-CM

## 2022-10-07 NOTE — Telephone Encounter (Signed)
Forwarding to Rx prior auth team.

## 2022-10-07 NOTE — Telephone Encounter (Signed)
Prolia VOB initiated via [email or web address] 

## 2022-10-07 NOTE — Telephone Encounter (Signed)
Patient's wife said that her husband needs a referral to a nephrologist - patient states that he has stage 3 kidney failure.

## 2022-10-07 NOTE — Telephone Encounter (Signed)
Forwarding to Rx prior auth team

## 2022-10-08 ENCOUNTER — Ambulatory Visit: Payer: Medicare Other | Admitting: Rheumatology

## 2022-10-08 NOTE — Assessment & Plan Note (Signed)
GFR>60 -resolved

## 2022-10-08 NOTE — Telephone Encounter (Signed)
No need.  This problem has resolved.  Thanks

## 2022-10-14 ENCOUNTER — Telehealth: Payer: Self-pay | Admitting: Pharmacist

## 2022-10-14 NOTE — Telephone Encounter (Signed)
PA request for Repatha received Key: [id number]

## 2022-10-14 NOTE — Telephone Encounter (Signed)
Prior auth submitted w/ [id number]. PA sent to [facility]../[email or web address]

## 2022-10-15 NOTE — Telephone Encounter (Signed)
Pt ready for scheduling on or after 11/02/22   Out-of-pocket cost due at time of visit: $0   Primary: Medicare Prolia co-insurance: 20% (approximately $276) Admin fee co-insurance: 20% (approximately $25)   Secondary: BCBS FEP Prolia co-insurance: will coordinate benefits and will consider 100% of remaining expenses. Admin fee co-insurance: will coordinate benefits and will consider 100% of remaining expenses.   Deductible: $226 of $226 met   Prior Auth: not required PA# Valid:    ** This summary of benefits is an estimation of the patient's out-of-pocket cost. Exact cost may vary based on individual plan coverage.

## 2022-10-21 NOTE — Telephone Encounter (Signed)
Submitted another PA w/ [id number] for prolia waiting on response.,.Roger Kent

## 2022-10-21 NOTE — Telephone Encounter (Signed)
I spoke with Roger Kent at [facility] today regarding a PA that they had received. Roger Kent said that PT shows oral RX membership with them currently.  PT does not have injection RX membership with them until 2024. They were wanting to make sure that the PA was sent to [facility] for the time being.

## 2022-10-21 NOTE — Telephone Encounter (Signed)
Received sax it states request for Prolia, however we are not able to locate member in [employer] or members plan ha been terminated. Pls contact pt to verify benefits.Marland KitchenJohny Chess

## 2022-10-22 NOTE — Telephone Encounter (Signed)
Rec'd determination med was APPROVED. WILL NOTIFY PT OF APPROVAL & GET SCHEDULE.Marland Kitchen/LMB

## 2022-10-22 NOTE — Telephone Encounter (Signed)
Theadora Rama, we finally go an approval for Mr. Fecteau proia inj. It states the authorization is valid from 09/21/22 through 10/21/23 but it did npt say anything about if pt has to pay copay.Marland KitchenJohny Chess

## 2022-10-22 NOTE — Telephone Encounter (Signed)
Forwarding to Rx Prior [employer]. Looks like pt has no out-of-pocket cost for Prolia.    Pt ready for scheduling on or after 11/02/22   Out-of-pocket cost due at time of visit: $0   Primary: Medicare Prolia co-insurance: 20% (approximately $276) Admin fee co-insurance: 20% (approximately $25)   Secondary: BCBS FEP Prolia co-insurance: will coordinate benefits and will consider 100% of remaining expenses. Admin fee co-insurance: will coordinate benefits and will consider 100% of remaining expenses.   Deductible: $226 of $226 met   Prior Auth: not required PA# Valid:    ** This summary of benefits is an estimation of the patient's out-of-pocket cost. Exact cost may vary based on individual plan coverage.

## 2022-10-23 NOTE — Telephone Encounter (Signed)
Called pt no answer LMOM w/ Brandy response. Can scheduled prolia after 11/02/22.Marland KitchenJohny Chess
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
## 2022-10-25 ENCOUNTER — Telehealth: Payer: Self-pay | Admitting: Internal Medicine

## 2022-10-25 NOTE — Telephone Encounter (Signed)
LVM for pt to rt call to schedule AWV with [facility] call back # [phone]

## 2022-10-28 DIAGNOSIS — H40013 Open angle with borderline findings, low risk, bilateral: Secondary | ICD-10-CM | POA: Diagnosis not present

## 2022-10-28 DIAGNOSIS — H04123 Dry eye syndrome of bilateral lacrimal glands: Secondary | ICD-10-CM | POA: Diagnosis not present

## 2022-10-28 DIAGNOSIS — H35372 Puckering of macula, left eye: Secondary | ICD-10-CM | POA: Diagnosis not present

## 2022-10-28 DIAGNOSIS — H1045 Other chronic allergic conjunctivitis: Secondary | ICD-10-CM | POA: Diagnosis not present

## 2022-10-29 ENCOUNTER — Other Ambulatory Visit: Payer: Self-pay | Admitting: Internal Medicine

## 2022-10-31 ENCOUNTER — Telehealth: Payer: Self-pay

## 2022-10-31 NOTE — Telephone Encounter (Signed)
Pt's wife LM wanting to know if we could add on labs for vascular issues and rule out dementia at pts next apt on 11/05/22. Called wife back and LM advising her that we do not draw those labs in this office and to contact their PCPs office.

## 2022-11-01 ENCOUNTER — Ambulatory Visit: Payer: Medicare Other

## 2022-11-04 ENCOUNTER — Other Ambulatory Visit: Payer: Self-pay | Admitting: *Deleted

## 2022-11-04 ENCOUNTER — Ambulatory Visit: Payer: Medicare Other

## 2022-11-04 DIAGNOSIS — I723 Aneurysm of iliac artery: Secondary | ICD-10-CM

## 2022-11-04 NOTE — Progress Notes (Signed)
VAS 

## 2022-11-05 ENCOUNTER — Inpatient Hospital Stay: Payer: Medicare Other | Attending: Hematology & Oncology

## 2022-11-05 ENCOUNTER — Other Ambulatory Visit: Payer: Self-pay

## 2022-11-05 ENCOUNTER — Encounter: Payer: Self-pay | Admitting: Hematology & Oncology

## 2022-11-05 ENCOUNTER — Inpatient Hospital Stay (HOSPITAL_BASED_OUTPATIENT_CLINIC_OR_DEPARTMENT_OTHER): Payer: Medicare Other | Admitting: Hematology & Oncology

## 2022-11-05 VITALS — BP 138/82 | HR 70 | Temp 98.1°F | Resp 18 | Ht 71.0 in | Wt 192.0 lb

## 2022-11-05 DIAGNOSIS — I82509 Chronic embolism and thrombosis of unspecified deep veins of unspecified lower extremity: Secondary | ICD-10-CM

## 2022-11-05 DIAGNOSIS — D500 Iron deficiency anemia secondary to blood loss (chronic): Secondary | ICD-10-CM

## 2022-11-05 DIAGNOSIS — Z86718 Personal history of other venous thrombosis and embolism: Secondary | ICD-10-CM | POA: Insufficient documentation

## 2022-11-05 DIAGNOSIS — D6862 Lupus anticoagulant syndrome: Secondary | ICD-10-CM | POA: Insufficient documentation

## 2022-11-05 DIAGNOSIS — Z7982 Long term (current) use of aspirin: Secondary | ICD-10-CM | POA: Insufficient documentation

## 2022-11-05 DIAGNOSIS — D509 Iron deficiency anemia, unspecified: Secondary | ICD-10-CM | POA: Insufficient documentation

## 2022-11-05 LAB — IRON AND IRON BINDING CAPACITY (CC-WL,HP ONLY)
Iron: 197 ug/dL — ABNORMAL HIGH (ref 45–182)
Saturation Ratios: 65 % — ABNORMAL HIGH (ref 17.9–39.5)
TIBC: 301 ug/dL (ref 250–450)
UIBC: 104 ug/dL — ABNORMAL LOW (ref 117–376)

## 2022-11-05 LAB — RETICULOCYTES
Immature Retic Fract: 14.9 % (ref 2.3–15.9)
RBC.: 4.8 MIL/uL (ref 4.22–5.81)
Retic Count, Absolute: 52.3 10*3/uL (ref 19.0–186.0)
Retic Ct Pct: 1.1 % (ref 0.4–3.1)

## 2022-11-05 LAB — CMP (CANCER CENTER ONLY)
ALT: 15 U/L (ref 0–44)
AST: 17 U/L (ref 15–41)
Albumin: 4.6 g/dL (ref 3.5–5.0)
Alkaline Phosphatase: 35 U/L — ABNORMAL LOW (ref 38–126)
Anion gap: 7 (ref 5–15)
BUN: 15 mg/dL (ref 8–23)
CO2: 28 mmol/L (ref 22–32)
Calcium: 9.5 mg/dL (ref 8.9–10.3)
Chloride: 100 mmol/L (ref 98–111)
Creatinine: 0.98 mg/dL (ref 0.61–1.24)
GFR, Estimated: >60 mL/min (ref >60)
Glucose, Bld: 98 mg/dL (ref 70–99)
Potassium: 4.4 mmol/L (ref 3.5–5.1)
Sodium: 135 mmol/L (ref 135–145)
Total Bilirubin: 0.7 mg/dL (ref 0.3–1.2)
Total Protein: 7.9 g/dL (ref 6.5–8.1)

## 2022-11-05 LAB — FERRITIN: Ferritin: 119 ng/mL (ref 24–336)

## 2022-11-05 LAB — CBC WITH DIFFERENTIAL (CANCER CENTER ONLY)
Abs Immature Granulocytes: 0 10*3/uL (ref 0.00–0.07)
Basophils Absolute: 0 10*3/uL (ref 0.0–0.1)
Basophils Relative: 0 %
Eosinophils Absolute: 0.3 10*3/uL (ref 0.0–0.5)
Eosinophils Relative: 4 %
HCT: 45.2 % (ref 39.0–52.0)
Hemoglobin: 14.7 g/dL (ref 13.0–17.0)
Immature Granulocytes: 0 %
Lymphocytes Relative: 37 %
Lymphs Abs: 2.6 10*3/uL (ref 0.7–4.0)
MCH: 30.3 pg (ref 26.0–34.0)
MCHC: 32.5 g/dL (ref 30.0–36.0)
MCV: 93.2 fL (ref 80.0–100.0)
Monocytes Absolute: 0.7 10*3/uL (ref 0.1–1.0)
Monocytes Relative: 10 %
Neutro Abs: 3.5 10*3/uL (ref 1.7–7.7)
Neutrophils Relative %: 49 %
Platelet Count: 232 10*3/uL (ref 150–400)
RBC: 4.85 MIL/uL (ref 4.22–5.81)
RDW: 14.9 % (ref 11.5–15.5)
WBC Count: 7 10*3/uL (ref 4.0–10.5)
nRBC: 0 % (ref 0.0–0.2)

## 2022-11-05 NOTE — Progress Notes (Signed)
Hematology and Oncology Follow Up Visit  Roger Kent [id number] 08/11/1946 76 y.o. 11/05/2022   Principle Diagnosis:  DVT of the right peroneal vein SVT of the right saphenous vein (+) lupus anti-coagulant Iron deficiency anemia  Current Therapy:   Xarelto 20 mg po q day -- 1 yr of therapeutic to complete in 05/2019 Xarelto 10 mg po q day -- maintenance x 21yr-- start 05/2019 -- completed on 06/2020 EC ASA 81 mg po q day -- started on 01/11/2019 -- changed to 325 mg po q day on 06/05/2020 IV iron-Monoferric given on 05/03/2022     Interim History:  Roger Kent back for follow-up.  We last saw him back in September.  Since then, has been doing quite well.  He has had no real complaints.  He is doing oral iron.  He has had no problems with the oral iron.  He has responded well to oral iron.  When we saw him in September, his ferritin was 28 with an iron saturation of 49%.  He has knee problems.  He is trying to avoid knee surgery.  He is doing yoga.  Hopefully, he will not need knee surgery.  He has undergone endoscopy.  He has had upper and lower endoscopy.  I think this was back in August or early September.  Everything looked fine outside of that has some diverticulosis.  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  He is a bWarehouse managerof honey.  He will get ready for hunting season beginning in spring.  He has had no problems with the aspirin.  He is taking aspirin daily.  He has had no melena or bright red blood per rectum.  Has been no leg pain.  He has had no leg swelling.  Overall, I would say his performance status is probably Roger Kent 0.  Medications:  Current Outpatient Medications:    aspirin 325 MG tablet, aspirin 325 mg tablet  Take 1 tablet every day by oral route., Disp: , Rfl:    Carboxymethylcellulose Sodium (EYE DROPS OP), Apply to eye., Disp: , Rfl:    cholecalciferol (VITAMIN D) 1000 UNITS tablet, Take 1,000 Units by mouth every morning., Disp:  , Rfl:    Cyanocobalamin (VITAMIN B 12 PO), Take by mouth., Disp: , Rfl:    Digestive Aids Mixture (DIGESTION GB PO), Take by mouth., Disp: , Rfl:    EPINEPHrine (EPIPEN 2-PAK) 0.3 mg/0.3 mL IJ SOAJ injection, Inject 0.3 mLs (0.3 mg total) into the muscle as needed for anaphylaxis., Disp: 1 Device, Rfl: 3   esomeprazole (NEXIUM) 20 MG capsule, Take 1 capsule (20 mg total) by mouth daily at 12 noon., Disp: 90 capsule, Rfl: 2   Evolocumab (REPATHA SURECLICK) 1315MG/ML SOAJ, inject 140 mg into the skin once every 14 days, Disp: 6 mL, Rfl: 3   ferrous sulfate 325 (65 FE) MG tablet, Take 1 tablet (325 mg total) by mouth daily with breakfast., Disp: 30 tablet, Rfl: 2   glucosamine-chondroitin 500-400 MG tablet, Take 2 tablets by mouth every morning. , Disp: , Rfl:    Lactobacillus ([employer] ACIDOPHILUS PO), Take by mouth daily., Disp: , Rfl:    Multiple Vitamin (MULTIVITAMIN WITH MINERALS) TABS tablet, Take 1 tablet by mouth every morning., Disp: , Rfl:    NON FORMULARY, Advanced Bladder- [employer], Disp: , Rfl:    olmesartan (BENICAR) 40 MG tablet, TAKE 1 TABLET DAILY, Disp: 90 tablet, Rfl: 3   olopatadine (PATANOL) 0.1 % ophthalmic solution, Place  1 drop into both eyes daily at 6 (six) AM., Disp: , Rfl:    OVER THE COUNTER MEDICATION, Take 3 capsules by mouth every morning. OTC herbal supplement for prostate health, Disp: , Rfl:   Allergies:  Allergies  Allergen Reactions   Oxycodone-Acetaminophen Other (See Comments)    Other Reaction: agitation   Crestor [Rosuvastatin]    Zetia [Ezetimibe]     Brain fog, felt sluggish   Lamisil [Terbinafine] Rash    Past Medical History, Surgical history, Social history, and Family History were reviewed and updated.  Review of Systems: Review of Systems  Constitutional: Negative.   HENT:  Negative.    Eyes: Negative.   Respiratory: Negative.    Cardiovascular: Negative.   Gastrointestinal: Negative.   Endocrine: Negative.   Genitourinary: Negative.     Musculoskeletal: Negative.   Skin: Negative.   Neurological: Negative.   Hematological: Negative.   Psychiatric/Behavioral: Negative.      Physical Exam:  vitals were not taken for this visit.   Wt Readings from Last 3 Encounters:  09/26/22 192 lb (87.1 kg)  09/10/22 192 lb 9.6 oz (87.4 kg)  08/06/22 189 lb 8 oz (86 kg)    Physical Exam Vitals reviewed.  HENT:     Head: Normocephalic and atraumatic.  Eyes:     Pupils: Pupils are equal, round, and reactive to light.  Cardiovascular:     Rate and Rhythm: Normal rate and regular rhythm.     Heart sounds: Normal heart sounds.  Pulmonary:     Effort: Pulmonary effort is normal.     Breath sounds: Normal breath sounds.  Abdominal:     General: Bowel sounds are normal.     Palpations: Abdomen is soft.  Genitourinary:    Comments: Rectal exam showed no external hemorrhoids.  He had no mass in the rectal vault.  His stool was brown and heme negative. Musculoskeletal:        General: No tenderness or deformity. Normal range of motion.     Cervical back: Normal range of motion.  Lymphadenopathy:     Cervical: No cervical adenopathy.  Skin:    General: Skin is warm and dry.     Findings: No erythema or rash.  Neurological:     Mental Status: He is alert and oriented to person, place, and time.  Psychiatric:        Behavior: Behavior normal.        Thought Content: Thought content normal.        Judgment: Judgment normal.      Lab Results  Component Value Date   WBC 7.0 11/05/2022   HGB 14.7 11/05/2022   HCT 45.2 11/05/2022   MCV 93.2 11/05/2022   PLT 232 11/05/2022     Chemistry      Component Value Date/Time   NA 135 11/05/2022 1018   NA 140 05/10/2021 0842   K 4.4 11/05/2022 1018   CL 100 11/05/2022 1018   CO2 28 11/05/2022 1018   BUN 15 11/05/2022 1018   BUN 15 05/10/2021 0842   CREATININE 0.98 11/05/2022 1018   CREATININE 0.98 08/01/2020 0924      Component Value Date/Time   CALCIUM 9.5 11/05/2022  1018   ALKPHOS 35 (L) 11/05/2022 1018   AST 17 11/05/2022 1018   ALT 15 11/05/2022 1018   BILITOT 0.7 11/05/2022 1018       Impression and Plan: Roger Kent is a 76 year old white male.  He had a thrombus in  the left leg about 6 years ago.  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  I suspect he will always have this.  I do not see any indication that we have to do another Doppler.  He will stay on the full dose aspirin.  As long as he takes this with food, he will be in good shape.  I would like to get him back in 4 months now.  Will get him through the Winter.  I know that he will have a nice Christmas with his family.    Volanda Napoleon, MD 12/19/202311:01 AM

## 2022-11-06 ENCOUNTER — Ambulatory Visit: Payer: Medicare Other | Admitting: Internal Medicine

## 2022-11-06 ENCOUNTER — Encounter: Payer: Self-pay | Admitting: Rheumatology

## 2022-11-06 ENCOUNTER — Ambulatory Visit: Payer: Medicare Other | Attending: Rheumatology | Admitting: Rheumatology

## 2022-11-06 VITALS — BP 150/87 | HR 67 | Resp 18 | Ht 71.0 in | Wt 192.8 lb

## 2022-11-06 DIAGNOSIS — N183 Chronic kidney disease, stage 3 unspecified: Secondary | ICD-10-CM | POA: Diagnosis not present

## 2022-11-06 DIAGNOSIS — M4156 Other secondary scoliosis, lumbar region: Secondary | ICD-10-CM | POA: Diagnosis not present

## 2022-11-06 DIAGNOSIS — M170 Bilateral primary osteoarthritis of knee: Secondary | ICD-10-CM | POA: Diagnosis not present

## 2022-11-06 DIAGNOSIS — M19072 Primary osteoarthritis, left ankle and foot: Secondary | ICD-10-CM | POA: Insufficient documentation

## 2022-11-06 DIAGNOSIS — N200 Calculus of kidney: Secondary | ICD-10-CM

## 2022-11-06 DIAGNOSIS — M436 Torticollis: Secondary | ICD-10-CM | POA: Diagnosis not present

## 2022-11-06 DIAGNOSIS — M19042 Primary osteoarthritis, left hand: Secondary | ICD-10-CM

## 2022-11-06 DIAGNOSIS — Q210 Ventricular septal defect: Secondary | ICD-10-CM | POA: Diagnosis not present

## 2022-11-06 DIAGNOSIS — Z8719 Personal history of other diseases of the digestive system: Secondary | ICD-10-CM | POA: Diagnosis not present

## 2022-11-06 DIAGNOSIS — M51369 Other intervertebral disc degeneration, lumbar region without mention of lumbar back pain or lower extremity pain: Secondary | ICD-10-CM

## 2022-11-06 DIAGNOSIS — M19071 Primary osteoarthritis, right ankle and foot: Secondary | ICD-10-CM

## 2022-11-06 DIAGNOSIS — K2270 Barrett's esophagus without dysplasia: Secondary | ICD-10-CM

## 2022-11-06 DIAGNOSIS — I10 Essential (primary) hypertension: Secondary | ICD-10-CM | POA: Diagnosis not present

## 2022-11-06 DIAGNOSIS — I2510 Atherosclerotic heart disease of native coronary artery without angina pectoris: Secondary | ICD-10-CM | POA: Diagnosis not present

## 2022-11-06 DIAGNOSIS — I2583 Coronary atherosclerosis due to lipid rich plaque: Secondary | ICD-10-CM | POA: Diagnosis not present

## 2022-11-06 DIAGNOSIS — G629 Polyneuropathy, unspecified: Secondary | ICD-10-CM

## 2022-11-06 DIAGNOSIS — M19041 Primary osteoarthritis, right hand: Secondary | ICD-10-CM | POA: Insufficient documentation

## 2022-11-06 DIAGNOSIS — Z8619 Personal history of other infectious and parasitic diseases: Secondary | ICD-10-CM | POA: Diagnosis not present

## 2022-11-06 DIAGNOSIS — Z86718 Personal history of other venous thrombosis and embolism: Secondary | ICD-10-CM

## 2022-11-06 DIAGNOSIS — R350 Frequency of micturition: Secondary | ICD-10-CM | POA: Insufficient documentation

## 2022-11-06 DIAGNOSIS — N401 Enlarged prostate with lower urinary tract symptoms: Secondary | ICD-10-CM

## 2022-11-06 DIAGNOSIS — M810 Age-related osteoporosis without current pathological fracture: Secondary | ICD-10-CM | POA: Diagnosis not present

## 2022-11-06 DIAGNOSIS — E785 Hyperlipidemia, unspecified: Secondary | ICD-10-CM

## 2022-11-06 DIAGNOSIS — M5136 Other intervertebral disc degeneration, lumbar region: Secondary | ICD-10-CM | POA: Insufficient documentation

## 2022-11-06 NOTE — Patient Instructions (Signed)
Back Exercises The following exercises strengthen the muscles that help to support the trunk (torso) and back. They also help to keep the lower back flexible. Doing these exercises can help to prevent or lessen existing low back pain. If you have back pain or discomfort, try doing these exercises 2-3 times each day or as told by your health care provider. As your pain improves, do them once each day, but increase the number of times that you repeat the steps for each exercise (do more repetitions). To prevent the recurrence of back pain, continue to do these exercises once each day or as told by your health care provider. Do exercises exactly as told by your health care provider and adjust them as directed. It is normal to feel mild stretching, pulling, tightness, or discomfort as you do these exercises, but you should stop right away if you feel sudden pain or your pain gets worse. Exercises Single knee to chest Repeat these steps 3-5 times for each leg: Lie on your back on a firm bed or the floor with your legs extended. Bring one knee to your chest. Your other leg should stay extended and in contact with the floor. Hold your knee in place by grabbing your knee or thigh with both hands and hold. Pull on your knee until you feel a gentle stretch in your lower back or buttocks. Hold the stretch for 10-30 seconds. Slowly release and straighten your leg.  Pelvic tilt Repeat these steps 5-10 times: Lie on your back on a firm bed or the floor with your legs extended. Bend your knees so they are pointing toward the ceiling and your feet are flat on the floor. Tighten your lower abdominal muscles to press your lower back against the floor. This motion will tilt your pelvis so your tailbone points up toward the ceiling instead of pointing to your feet or the floor. With gentle tension and even breathing, hold this position for 5-10 seconds.  Cat-cow Repeat these steps until your lower back becomes  more flexible: Get into a hands-and-knees position on a firm bed or the floor. Keep your hands under your shoulders, and keep your knees under your hips. You may place padding under your knees for comfort. Let your head hang down toward your chest. Contract your abdominal muscles and point your tailbone toward the floor so your lower back becomes rounded like the back of a cat. Hold this position for 5 seconds. Slowly lift your head, let your abdominal muscles relax, and point your tailbone up toward the ceiling so your back forms a sagging arch like the back of a cow. Hold this position for 5 seconds.  Press-ups Repeat these steps 5-10 times: Lie on your abdomen (face-down) on a firm bed or the floor. Place your palms near your head, about shoulder-width apart. Keeping your back as relaxed as possible and keeping your hips on the floor, slowly straighten your arms to raise the top half of your body and lift your shoulders. Do not use your back muscles to raise your upper torso. You may adjust the placement of your hands to make yourself more comfortable. Hold this position for 5 seconds while you keep your back relaxed. Slowly return to lying flat on the floor.  Bridges Repeat these steps 10 times: Lie on your back on a firm bed or the floor. Bend your knees so they are pointing toward the ceiling and your feet are flat on the floor. Your arms should be flat   at your sides, next to your body. Tighten your buttocks muscles and lift your buttocks off the floor until your waist is at almost the same height as your knees. You should feel the muscles working in your buttocks and the back of your thighs. If you do not feel these muscles, slide your feet 1-2 inches (2.5-5 cm) farther away from your buttocks. Hold this position for 3-5 seconds. Slowly lower your hips to the starting position, and allow your buttocks muscles to relax completely. If this exercise is too easy, try doing it with your arms  crossed over your chest. Abdominal crunches Repeat these steps 5-10 times: Lie on your back on a firm bed or the floor with your legs extended. Bend your knees so they are pointing toward the ceiling and your feet are flat on the floor. Cross your arms over your chest. Tip your chin slightly toward your chest without bending your neck. Tighten your abdominal muscles and slowly raise your torso high enough to lift your shoulder blades a tiny bit off the floor. Avoid raising your torso higher than that because it can put too much stress on your lower back and does not help to strengthen your abdominal muscles. Slowly return to your starting position.  Back lifts Repeat these steps 5-10 times: Lie on your abdomen (face-down) with your arms at your sides, and rest your forehead on the floor. Tighten the muscles in your legs and your buttocks. Slowly lift your chest off the floor while you keep your hips pressed to the floor. Keep the back of your head in line with the curve in your back. Your eyes should be looking at the floor. Hold this position for 3-5 seconds. Slowly return to your starting position.  Contact a health care provider if: Your back pain or discomfort gets much worse when you do an exercise. Your worsening back pain or discomfort does not lessen within 2 hours after you exercise. If you have any of these problems, stop doing these exercises right away. Do not do them again unless your health care provider says that you can. Get help right away if: You develop sudden, severe back pain. If this happens, stop doing the exercises right away. Do not do them again unless your health care provider says that you can. This information is not intended to replace advice given to you by your health care provider. Make sure you discuss any questions you have with your health care provider. Document Revised: 05/01/2021 Document Reviewed: 01/17/2021 Elsevier Patient Education  [place]. Cervical Strain and Sprain Rehab Ask your health care provider which exercises are safe for you. Do exercises exactly as told by your health care provider and adjust them as directed. It is normal to feel mild stretching, pulling, tightness, or discomfort as you do these exercises. Stop right away if you feel sudden pain or your pain gets worse. Do not begin these exercises until told by your health care provider. Stretching and range-of-motion exercises Cervical side bending  Using good posture, sit on a stable chair or stand up. Without moving your shoulders, slowly tilt your left / right ear to your shoulder until you feel a stretch in the neck muscles on the opposite side. You should be looking straight ahead. Hold for __________ seconds. Repeat with the other side of your neck. Repeat __________ times. Complete this exercise __________ times a day. Cervical rotation  Using good posture, sit on a stable chair or stand up. Slowly  turn your head to the side as if you are looking over your left / right shoulder. Keep your eyes level with the ground. Stop when you feel a stretch along the side and the back of your neck. Hold for __________ seconds. Repeat this by turning to your other side. Repeat __________ times. Complete this exercise __________ times a day. Thoracic extension and pectoral stretch  Roll a towel or a small blanket so it is about 4 inches (10 cm) in diameter. Lie down on your back on a firm surface. Put the towel in the middle of your back across your spine. It should not be under your shoulder blades. Put your hands behind your head and let your elbows fall out to your sides. Hold for __________ seconds. Repeat __________ times. Complete this exercise __________ times a day. Strengthening exercises Upper cervical flexion  Lie on your back with a thin pillow behind your head or a small, rolled-up towel under your neck. Gently tuck your chin toward your chest and  nod your head down to look toward your feet. Do not lift your head off the pillow. Hold for __________ seconds. Release the tension slowly. Relax your neck muscles completely before you repeat this exercise. Repeat __________ times. Complete this exercise __________ times a day. Cervical extension  Stand about 6 inches (15 cm) away from a wall, with your back facing the wall. Place a soft object, about 6-8 inches (15-20 cm) in diameter, between the back of your head and the wall. A soft object could be a small pillow, a ball, or a folded towel. Gently tilt your head back and press into the soft object. Keep your jaw and forehead relaxed. Hold for __________ seconds. Release the tension slowly. Relax your neck muscles completely before you repeat this exercise. Repeat __________ times. Complete this exercise __________ times a day. Posture and body mechanics Body mechanics refer to the movements and positions of your body while you do your daily activities. Posture is part of body mechanics. Good posture and healthy body mechanics can help to relieve stress in your body's tissues and joints. Good posture means that your spine is in its natural S-curve position (your spine is neutral), your shoulders are pulled back slightly, and your head is not tipped forward. The following are general guidelines for using improved posture and body mechanics in your everyday activities. Sitting  When sitting, keep your spine neutral and keep your feet flat on the floor. Use a footrest, if needed, and keep your thighs parallel to the floor. Avoid rounding your shoulders. Avoid tilting your head forward. When working at a desk or a computer, keep your desk at a height where your hands are slightly lower than your elbows. Slide your chair under your desk so you are close enough to maintain good posture. When working at a computer, place your monitor at a height where you are looking straight ahead and you do not have  to tilt your head forward or downward to look at the screen. Standing  When standing, keep your spine neutral and keep your feet about hip-width apart. Keep a slight bend in your knees. Your ears, shoulders, and hips should line up. When you do a task in which you stand in one place for a long time, place one foot up on a stable object that is 2-4 inches (5-10 cm) high, such as a footstool. This helps keep your spine neutral. Resting When lying down and resting, avoid positions that are  most painful for you. Try to support your neck in a neutral position. You can use a contour pillow or a small rolled-up towel. Your pillow should support your neck but not push on it. This information is not intended to replace advice given to you by your health care provider. Make sure you discuss any questions you have with your health care provider. Document Revised: 05/27/2022 Document Reviewed: 05/27/2022 Elsevier Patient Education  [place].

## 2022-11-07 ENCOUNTER — Ambulatory Visit (INDEPENDENT_AMBULATORY_CARE_PROVIDER_SITE_OTHER): Payer: Medicare Other

## 2022-11-07 DIAGNOSIS — M810 Age-related osteoporosis without current pathological fracture: Secondary | ICD-10-CM | POA: Diagnosis not present

## 2022-11-07 MED ORDER — DENOSUMAB 60 MG/ML ~~LOC~~ SOSY
60.0000 mg | PREFILLED_SYRINGE | Freq: Once | SUBCUTANEOUS | Status: AC
  Administered 2022-11-07: 60 mg via SUBCUTANEOUS

## 2022-11-07 NOTE — Progress Notes (Addendum)
Pt was given Prolia inj w/o any complications.  Medical screening examination/treatment/procedure(s) were performed by non-physician practitioner and as supervising physician I was immediately available for consultation/collaboration.  I agree with above. Lew Dawes, MD

## 2022-11-08 DIAGNOSIS — H903 Sensorineural hearing loss, bilateral: Secondary | ICD-10-CM | POA: Diagnosis not present

## 2022-11-22 ENCOUNTER — Telehealth: Payer: Self-pay | Admitting: Cardiology

## 2022-11-22 NOTE — Telephone Encounter (Signed)
01.05.2024 Called pt about getting return mail asked pt to call back in and confirm update address on file.

## 2022-12-09 ENCOUNTER — Ambulatory Visit (HOSPITAL_COMMUNITY)
Admission: RE | Admit: 2022-12-09 | Discharge: 2022-12-09 | Disposition: A | Discharge status: Home / Self Care | Payer: Medicare Other | Source: Ambulatory Visit | Attending: Cardiology | Admitting: Cardiology

## 2022-12-09 DIAGNOSIS — I723 Aneurysm of iliac artery: Secondary | ICD-10-CM | POA: Diagnosis not present

## 2022-12-24 NOTE — Progress Notes (Signed)
HPI: FU congenital VSD, and prior DVT. Calcium score June 2020-277 and mildly dilated ascending aorta at 4.3 cm. Nuclear study June 2020 showed ejection fraction 51%, small fixed apical defect felt to be artifact and no ischemia. Echocardiogram February 2021 showed normal LV function, small restrictive ventricular septal defect with left-to-right shunting, mild aortic insufficiency, mildly dilated ascending aorta at 41 mm.  Venous Dopplers December 2021 showed chronic partially occlusive thrombus within the left superficial femoral and popliteal veins. CTA June 2023 showed 4.1 cm ascending thoracic aortic aneurysm.  Abdominal ultrasound January 2024 showed 3 cm abdominal aortic aneurysm, dilatation of the right common iliac, left common iliac and left external iliac artery.  Since I last saw him   Current Outpatient Medications  Medication Sig Dispense Refill   aspirin 325 MG tablet aspirin 325 mg tablet  Take 1 tablet every day by oral route.     Carboxymethylcellulose Sodium (EYE DROPS OP) Apply to eye.     cholecalciferol (VITAMIN D) 1000 UNITS tablet Take 1,000 Units by mouth every morning.     Cyanocobalamin (VITAMIN B 12 PO) Take by mouth.     Digestive Aids Mixture (DIGESTION GB PO) Take by mouth.     EPINEPHrine (EPIPEN 2-PAK) 0.3 mg/0.3 mL IJ SOAJ injection Inject 0.3 mLs (0.3 mg total) into the muscle as needed for anaphylaxis. 1 Device 3   esomeprazole (NEXIUM) 20 MG capsule Take 1 capsule (20 mg total) by mouth daily at 12 noon. 90 capsule 2   Evolocumab (REPATHA SURECLICK) [id number] MG/ML SOAJ inject 140 mg into the skin once every 14 days 6 mL 3   ferrous sulfate 325 (65 FE) MG tablet Take 1 tablet (325 mg total) by mouth daily with breakfast. 30 tablet 2   glucosamine-chondroitin 500-400 MG tablet Take 2 tablets by mouth every morning.      Lactobacillus (CVS ACIDOPHILUS PO) Take by mouth daily.     Multiple Vitamin (MULTIVITAMIN WITH MINERALS) TABS tablet Take 1 tablet by mouth  every morning.     NON FORMULARY Advanced Bladder- Amazon     olmesartan (BENICAR) 40 MG tablet TAKE 1 TABLET DAILY 90 tablet 3   olopatadine (PATANOL) 0.1 % ophthalmic solution Place 1 drop into both eyes daily at 6 (six) AM.     OVER THE COUNTER MEDICATION Take 3 capsules by mouth every morning. OTC herbal supplement for prostate health     OVER THE COUNTER MEDICATION Relief factor 3 times daily.     No current facility-administered medications for this visit.     Past Medical History:  Diagnosis Date   Acute hepatitis C without mention of hepatic coma(070.51)    Allergic rhinitis, cause unspecified    Anxiety state, unspecified    Arthritis    Barrett's esophagus    BPH (benign prostatic hyperplasia)    DVT (deep venous thrombosis) (HCC)    Elevated PSA    Dr Karsten Ro   Esophageal reflux    Hiatal hernia    Hyperlipidemia    Hypertension    Hypertonicity of bladder    Insomnia    Iron deficiency anemia 04/30/2022   Iron malabsorption 04/30/2022   Leg DVT (deep venous thromboembolism), chronic, unspecified laterality ([place]) 09/21/2018   Osteoporosis, unspecified    Personal history of urinary calculi    Recent retinal detachment, partial, with giant tear    Unspecified tinnitus    Ventricular septal defect     Past Surgical History:  Procedure Laterality Date  COLONOSCOPY     KNEE SURGERY Bilateral    RETINAL DETACHMENT SURGERY     TOE SURGERY Left    great toe   UPPER GI ENDOSCOPY      Social History   Socioeconomic History   Marital status: Married    Spouse name: [name] Gemma   Number of children: 1   Years of education: Not on file   Highest education level: Not on file  Occupational History   Occupation: retired    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   Occupation: bee keepin  Tobacco Use   Smoking status: Former    Packs/day: 1.00    Years: 50.00    Total pack years: 50.00    Types: Cigarettes    Quit date: 1976    Years since  quitting: 48.1    Passive exposure: Past   Smokeless tobacco: Never  Vaping Use   Vaping Use: Never used  Substance and Sexual Activity   Alcohol use: No   Drug use: No   Sexual activity: Yes  Other Topics Concern   Not on file  Social History Narrative               Social Determinants of Health   Financial Resource Strain: Low Risk  (11/06/2021)   Overall Financial Resource Strain (CARDIA)    Difficulty of Paying Living Expenses: Not hard at all  Food Insecurity: Unknown (11/06/2021)   Hunger Vital Sign    Worried About Running Out of Food in the Last Year: Never true    [place] in the Last Year: Not on file  Transportation Needs: No Transportation Needs (11/06/2021)   PRAPARE - Hydrologist (Medical): No    Lack of Transportation (Non-Medical): No  Physical Activity: Sufficiently Active (11/06/2021)   Exercise Vital Sign    Days of Exercise per Week: 7 days    Minutes of Exercise per Session: 30 min  Stress: No Stress Concern Present (11/06/2021)   [place]    Feeling of Stress : Not at all  Social Connections: Socially Integrated (11/06/2021)   Social Connection and Isolation Panel [NHANES]    Frequency of Communication with Friends and Family: More than three times a week    Frequency of Social Gatherings with Friends and Family: More than three times a week    Attends Religious Services: More than 4 times per year    Active Member of [employer] or Organizations: Yes    Attends Music therapist: More than 4 times per year    Marital Status: Married  Human resources officer Violence: Not At Risk (11/06/2021)   Humiliation, Afraid, Rape, and Kick questionnaire    Fear of Current or Ex-Partner: No    Emotionally Abused: No    Physically Abused: No    Sexually Abused: No    Family History  Problem Relation Age of Onset   Ovarian cancer Mother    Deep vein  thrombosis Mother    Hypertension Other    Alcohol abuse Neg Hx    Colon cancer Neg Hx    Stomach cancer Neg Hx    Esophageal cancer Neg Hx    Colon polyps Neg Hx     ROS: no fevers or chills, productive cough, hemoptysis, dysphasia, odynophagia, melena, hematochezia, dysuria, hematuria, rash, seizure activity, orthopnea, PND, pedal edema, claudication. Remaining systems are negative.  Physical Exam: Well-developed well-nourished  in no acute distress.  Skin is warm and dry.  HEENT is normal.  Neck is supple.  Chest is clear to auscultation with normal expansion.  Cardiovascular exam is regular rate and rhythm.  Abdominal exam nontender or distended. No masses palpated. Extremities show no edema. neuro grossly intact  ECG-normal sinus rhythm at a rate of 64, no ST changes.  Personally reviewed  A/P  1 history of ventricular septal defect-most recent echocardiogram showed small defect and no dilatation of the right heart.  Will plan to repeat echocardiogram.  2 thoracic aortic aneurysm-plan follow-up CTA June 2024.  3 iliac aneurysms-plan follow-up ultrasound January 2025.  4 coronary calcification-continue aspirin.  Patient is intolerant to statins.  5 hypertension-blood pressure controlled.  Continue present medications.  6 aortic insufficiency-mild on most recent echocardiogram.  Plan follow-up study as outlined above.  7 hyperlipidemia-continue Repatha.  Kirk Ruths, MD

## 2022-12-30 ENCOUNTER — Telehealth: Payer: Self-pay

## 2022-12-30 NOTE — Telephone Encounter (Signed)
Left message for patient to return call to schedule 6 month follow up (dx GERD, barrett's) with Dr Lorenso Courier in April 2024. Will continue efforts.

## 2022-12-30 NOTE — Telephone Encounter (Signed)
Patient has been scheduled for 02/26/23.

## 2022-12-30 NOTE — Telephone Encounter (Signed)
Noted  

## 2023-01-07 ENCOUNTER — Encounter: Payer: Self-pay | Admitting: Cardiology

## 2023-01-07 ENCOUNTER — Ambulatory Visit: Payer: Medicare Other | Attending: Cardiology | Admitting: Cardiology

## 2023-01-07 VITALS — BP 120/60 | HR 64 | Ht 71.0 in | Wt 195.2 lb

## 2023-01-07 DIAGNOSIS — I723 Aneurysm of iliac artery: Secondary | ICD-10-CM | POA: Insufficient documentation

## 2023-01-07 DIAGNOSIS — I10 Essential (primary) hypertension: Secondary | ICD-10-CM | POA: Insufficient documentation

## 2023-01-07 DIAGNOSIS — Q210 Ventricular septal defect: Secondary | ICD-10-CM | POA: Diagnosis not present

## 2023-01-07 DIAGNOSIS — E785 Hyperlipidemia, unspecified: Secondary | ICD-10-CM | POA: Insufficient documentation

## 2023-01-07 DIAGNOSIS — I2510 Atherosclerotic heart disease of native coronary artery without angina pectoris: Secondary | ICD-10-CM | POA: Insufficient documentation

## 2023-01-07 DIAGNOSIS — I351 Nonrheumatic aortic (valve) insufficiency: Secondary | ICD-10-CM | POA: Diagnosis not present

## 2023-01-07 DIAGNOSIS — I712 Thoracic aortic aneurysm, without rupture, unspecified: Secondary | ICD-10-CM | POA: Diagnosis not present

## 2023-01-07 NOTE — Patient Instructions (Signed)
  Testing/Procedures:  Your physician has requested that you have an echocardiogram. Echocardiography is a painless test that uses sound waves to create images of your heart. It provides your doctor with information about the size and shape of your heart and how well your heart's chambers and valves are working. This procedure takes approximately one hour. There are no restrictions for this procedure. Please do NOT wear cologne, perfume, aftershave, or lotions (deodorant is allowed). Please arrive 15 minutes prior to your appointment time. [place], you and your health needs are our priority.  As part of our continuing mission to provide you with exceptional heart care, we have created designated Provider Care Teams.  These Care Teams include your primary Cardiologist (physician) and Advanced Practice Providers (APPs -  Physician Assistants and Nurse Practitioners) who all work together to provide you with the care you need, when you need it.  We recommend signing up for the patient portal called "MyChart".  Sign up information is provided on this After Visit Summary.  MyChart is used to connect with patients for Virtual Visits (Telemedicine).  Patients are able to view lab/test results, encounter notes, upcoming appointments, etc.  Non-urgent messages can be sent to your provider as well.   To learn more about what you can do with MyChart, go to [email or web address].    Your next appointment:   12 month(s)  Provider:   Kirk Ruths, MD

## 2023-01-10 ENCOUNTER — Ambulatory Visit (INDEPENDENT_AMBULATORY_CARE_PROVIDER_SITE_OTHER): Payer: Medicare Other

## 2023-01-10 VITALS — BP 120/78 | HR 72 | Temp 98.1°F | Ht 71.0 in | Wt 196.6 lb

## 2023-01-10 DIAGNOSIS — Z0000 Encounter for general adult medical examination without abnormal findings: Secondary | ICD-10-CM

## 2023-01-10 NOTE — Progress Notes (Signed)
Subjective:   Roger Kent is a 77 y.o. male who presents for Medicare Annual/Subsequent preventive examination.  Review of Systems     Cardiac Risk Factors include: advanced age (>57mn, >>60women);dyslipidemia;family history of premature cardiovascular disease;male gender;hypertension     Objective:    Today's Vitals   01/10/23 1523 01/10/23 1524  BP: 120/78   Pulse: 72   Temp: 98.1 F (36.7 C)   TempSrc: Temporal   SpO2: 97%   Weight: 196 lb 9.6 oz (89.2 kg)   Height: '5\' 11"'$  (1.803 m)   PainSc: 0-No pain 0-No pain   Body mass index is 27.42 kg/m.     01/10/2023    3:48 PM 11/05/2022   10:49 AM 08/06/2022   10:00 AM 06/11/2022    8:42 AM 04/30/2022    8:26 AM 11/06/2021   10:52 AM 10/29/2021    8:10 AM  Advanced Directives  Does Patient Have a Medical Advance Directive? Yes Yes Yes Yes Yes Yes Yes  Type of AParamedicof [place]uensterLiving will [place]aLiving will [place]Living will HWest BrownsvilleLiving will HGood[place]Living will Living will;Healthcare Power of [place]unset HillsLiving will  Does patient want to make changes to medical advance directive?  No - Patient declined No - Patient declined No - Patient declined No - Patient declined No - Patient declined   Copy of [place]Johnson [place]in Chart? No - copy requested  No - copy requested  No - copy requested No - copy requested No - copy requested  Would patient like information on creating a medical advance directive?   No - Patient declined  No - Patient declined      Current Medications (verified) Outpatient Encounter Medications as of 01/10/2023  Medication Sig   aspirin 325 MG tablet aspirin 325 mg tablet  Take 1 tablet every day by oral route.   Carboxymethylcellulose Sodium (EYE DROPS OP) Apply to eye.   cholecalciferol (VITAMIN D) 1000 UNITS tablet Take 1,000 Units by  mouth every morning.   Cyanocobalamin (VITAMIN B 12 PO) Take by mouth.   Digestive Aids Mixture (DIGESTION GB PO) Take by mouth.   EPINEPHrine (EPIPEN 2-PAK) 0.3 mg/0.3 mL IJ SOAJ injection Inject 0.3 mLs (0.3 mg total) into the muscle as needed for anaphylaxis.   esomeprazole (NEXIUM) 20 MG capsule Take 1 capsule (20 mg total) by mouth daily at 12 noon.   Evolocumab (REPATHA SURECLICK) [id number]/ML SOAJ inject 140 mg into the skin once every 14 days   ferrous sulfate 325 (65 FE) MG tablet Take 1 tablet (325 mg total) by mouth daily with breakfast.   glucosamine-chondroitin 500-400 MG tablet Take 2 tablets by mouth every morning.    Lactobacillus (CVS ACIDOPHILUS PO) Take by mouth daily.   Multiple Vitamin (MULTIVITAMIN WITH MINERALS) TABS tablet Take 1 tablet by mouth every morning.   NON FORMULARY Advanced Bladder- [employer]   olmesartan (BENICAR) 40 MG tablet TAKE 1 TABLET DAILY   olopatadine (PATANOL) 0.1 % ophthalmic solution Place 1 drop into both eyes daily at 6 (six) AM.   OVER THE COUNTER MEDICATION Take 3 capsules by mouth every morning. OTC herbal supplement for prostate health   OVER THE COUNTER MEDICATION Relief factor 3 times daily.   No facility-administered encounter medications on file as of 01/10/2023.    Allergies (verified) Oxycodone-acetaminophen, Crestor [rosuvastatin], Zetia [ezetimibe], and Lamisil [terbinafine]   History: Past Medical History:  Diagnosis Date   Acute hepatitis C without mention of hepatic coma(070.51)    Allergic rhinitis, cause unspecified    Anxiety state, unspecified    Arthritis    Barrett's esophagus    BPH (benign prostatic hyperplasia)    DVT (deep venous thrombosis) (HCC)    Elevated PSA    Dr Karsten Ro   Esophageal reflux    Hiatal hernia    Hyperlipidemia    Hypertension    Hypertonicity of bladder    Insomnia    Iron deficiency anemia 04/30/2022   Iron malabsorption 04/30/2022   Leg DVT (deep venous thromboembolism), chronic,  unspecified laterality ([place]) 09/21/2018   Osteoporosis, unspecified    Personal history of urinary calculi    Recent retinal detachment, partial, with giant tear    Unspecified tinnitus    Ventricular septal defect    Past Surgical History:  Procedure Laterality Date   COLONOSCOPY     KNEE SURGERY Bilateral    RETINAL DETACHMENT SURGERY     TOE SURGERY Left    great toe   UPPER GI ENDOSCOPY     Family History  Problem Relation Age of Onset   Ovarian cancer Mother    Deep vein thrombosis Mother    Hypertension Other    Alcohol abuse Neg Hx    Colon cancer Neg Hx    Stomach cancer Neg Hx    Esophageal cancer Neg Hx    Colon polyps Neg Hx    Social History   Socioeconomic History   Marital status: Married    Spouse name: [name] Halbert   Number of children: 1   Years of education: Not on file   Highest education level: Not on file  Occupational History   Occupation: retired    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   Occupation: bee keepin  Tobacco Use   Smoking status: Former    Packs/day: 1.00    Years: 50.00    Total pack years: 50.00    Types: Cigarettes    Quit date: 1976    Years since quitting: 48.1    Passive exposure: Past   Smokeless tobacco: Never  Vaping Use   Vaping Use: Never used  Substance and Sexual Activity   Alcohol use: No   Drug use: No   Sexual activity: Yes  Other Topics Concern   Not on file  Social History Narrative               Social Determinants of Health   Financial Resource Strain: Low Risk  (01/10/2023)   Overall Financial Resource Strain (CARDIA)    Difficulty of Paying Living Expenses: Not hard at all  Food Insecurity: No Food Insecurity (01/10/2023)   Hunger Vital Sign    Worried About Running Out of Food in the Last Year: Never true    Ran Out of Food in the Last Year: Never true  Transportation Needs: No Transportation Needs (01/10/2023)   PRAPARE - Hydrologist (Medical): No     Lack of Transportation (Non-Medical): No  Physical Activity: Insufficiently Active (01/10/2023)   Exercise Vital Sign    Days of Exercise per Week: 2 days    Minutes of Exercise per Session: 60 min  Stress: No Stress Concern Present (01/10/2023)   [place]    Feeling of Stress : Only a little  Social Connections: Socially Integrated (01/10/2023)   Social Connection and Isolation  Panel [NHANES]    Frequency of Communication with Friends and Family: Three times a week    Frequency of Social Gatherings with Friends and Family: Once a week    Attends Religious Services: More than 4 times per year    Active Member of [employer] or Organizations: Yes    Attends Archivist Meetings: 1 to 4 times per year    Marital Status: Married    Tobacco Counseling Counseling given: Not Answered   Clinical Intake:  Pre-visit preparation completed: Yes  Pain : No/denies pain Pain Score: 0-No pain     BMI - recorded: 27.22 Nutritional Status: BMI 25 -29 Overweight Nutritional Risks: None Diabetes: No  How often do you need to have someone help you when you read instructions, pamphlets, or other written materials from your doctor or pharmacy?: 1 - Never What is the last grade level you completed in school?: HSG  Diabetic? No  Interpreter Needed?: No  Information entered by :: Lisette Abu, LPN.   Activities of Daily Living    01/10/2023    4:27 PM 01/06/2023   12:50 PM  In your present state of health, do you have any difficulty performing the following activities:  Hearing? 0 0  Vision? 0 0  Difficulty concentrating or making decisions? 0   Walking or climbing stairs? 1 1  Dressing or bathing? 0 0  Doing errands, shopping? 0 0  Preparing Food and eating ? N N  Using the Toilet? N N  In the past six months, have you accidently leaked urine? Y Y  Do you have problems with loss of bowel control? N N  Managing  your Medications? N N  Managing your Finances? N N  Housekeeping or managing your Housekeeping? N N    Patient Care Team: Plotnikov, Evie Lacks, MD as PCP - General Stanford Breed, Denice Bors, MD as PCP - Cardiology (Cardiology) Kathie Rhodes, MD (Inactive) (Urology) Richmond Campbell, MD (Gastroenterology) Stanford Breed Denice Bors, MD (Cardiology) Vevelyn Royals, MD as Consulting Physician (Ophthalmology) Elouise Munroe, MD as Consulting Physician (Cardiology) Sharyn Creamer, MD as Consulting Physician (Gastroenterology)  Indicate any recent Medical Services you may have received from other than [facility] providers in the past year (date may be approximate).     Assessment:   This is a routine wellness examination for Roger Kent.  Hearing/Vision screen Hearing Screening - Comments:: Patient wears hearing aids. Vision Screening - Comments:: Wears rx glasses - up to date with routine eye exams with Tempie Hoist, MD.   Dietary issues and exercise activities discussed: Current Exercise Habits: Home exercise routine, Type of exercise: yoga;Other - see comments (aqua therapy, chair yoga, still owns honey business), Time (Minutes): 60, Frequency (Times/Week): 5, Weekly Exercise (Minutes/Week): 300, Intensity: Moderate, Exercise limited by: None identified   Goals Addressed             This Visit's Progress    My goal for 2024 is to stay in routine & maintain my health.        Depression Screen    01/10/2023    3:25 PM 03/19/2022    2:30 PM 11/06/2021   10:56 AM 09/01/2020    9:37 AM 08/01/2020    8:50 AM 07/15/2019    9:14 AM 04/06/2018   10:13 AM  PHQ 2/9 Scores  PHQ - 2 Score 0 0 0 0 0 0 0  PHQ- 9 Score  8         Fall Risk    01/10/2023  4:27 PM 01/06/2023   12:50 PM 03/19/2022    2:30 PM 11/06/2021   10:57 AM 09/01/2020    9:40 AM  Fall Risk   Falls in the past year? 1 1 0 0 0  Number falls in past yr: 0 0 0 0 0  Injury with Fall? 0 0 0 0 0  Risk for fall due to : No Fall Risks  No  Fall Risks No Fall Risks No Fall Risks  Follow up Falls evaluation completed;Education provided  Falls evaluation completed Falls prevention discussed Falls evaluation completed    [place]:  Any stairs in or around the home? Yes  If so, are there any without handrails? No  Home free of loose throw rugs in walkways, pet beds, electrical cords, etc? Yes  Adequate lighting in your home to reduce risk of falls? Yes   ASSISTIVE DEVICES UTILIZED TO PREVENT FALLS:  Life alert? No  Use of a cane, walker or w/c? No  Grab bars in the bathroom? No  Shower chair or bench in shower? No  Elevated toilet seat or a handicapped toilet? No   TIMED UP AND GO:  Was the test performed? Yes .  Length of time to ambulate 10 feet: 8 sec.   Gait steady and fast without use of assistive device  Cognitive Function:        01/10/2023    4:28 PM  6CIT Screen  What Year? 0 points  What month? 0 points  What time? 0 points  Count back from 20 0 points  Months in reverse 0 points  Repeat phrase 0 points  Total Score 0 points    Immunizations Immunization History  Administered Date(s) Administered   Fluad Quad(high Dose 65+) 07/15/2019, 08/01/2020, 07/25/2021, 08/08/2022   Influenza Split 09/16/2011, 08/04/2012   Influenza Whole 09/07/2007, 08/25/2008, 08/29/2009, 07/13/2010   Influenza, High Dose Seasonal PF 09/07/2016, 07/14/2017, 08/24/2018   Influenza,inj,Quad PF,6+ Mos 08/17/2013, 11/30/2014, 08/21/2015   PFIZER(Purple Top)SARS-COV-2 Vaccination 12/30/2019, 01/23/2020, 09/06/2020, 07/08/2021   Pneumococcal Conjugate-13 11/09/2013   Pneumococcal Polysaccharide-23 11/13/2007, 11/22/2015   Td 10/21/2012   Tdap 07/03/2013   Zoster Recombinat (Shingrix) 05/21/2017, 07/25/2017   Zoster, Live 10/29/2012    TDAP status: Up to date  Flu Vaccine status: Up to date  Pneumococcal vaccine status: Up to date  Covid-19 vaccine status: Completed  vaccines  Qualifies for Shingles Vaccine? Yes   Zostavax completed Yes   Shingrix Completed?: Yes  Screening Tests Health Maintenance  Topic Date Due   COVID-19 Vaccine (5 - 2023-24 season) 07/19/2022   DTaP/Tdap/Td (3 - Td or Tdap) 07/04/2023   Medicare Annual Wellness (AWV)  01/11/2024   Pneumonia Vaccine 5+ Years old  Completed   INFLUENZA VACCINE  Completed   Hepatitis C Screening  Completed   Zoster Vaccines- Shingrix  Completed   HPV VACCINES  Aged Out   COLONOSCOPY (Pts 45-17yr Insurance coverage will need to be confirmed)  Discontinued    Health Maintenance  Health Maintenance Due  Topic Date Due   COVID-19 Vaccine (5 - 2023-24 season) 07/19/2022    Colorectal cancer screening: No longer required.   Lung Cancer Screening: (Low Dose CT Chest recommended if Age 77-80years, 30 pack-year currently smoking OR have quit w/in 15years.) does not qualify.   Lung Cancer Screening Referral: no  Additional Screening:  Hepatitis C Screening: does qualify; Completed 11/06/2022  Vision Screening: Recommended annual ophthalmology exams for early detection of glaucoma and other disorders  of the eye. Is the patient up to date with their annual eye exam?  Yes  Who is the provider or what is the name of the office in which the patient attends annual eye exams? Tempie Hoist, MD. If pt is not established with a provider, would they like to be referred to a provider to establish care? No .   Dental Screening: Recommended annual dental exams for proper oral hygiene  Community Resource Referral / Chronic Care Management: CRR required this visit?  No   CCM required this visit?  No      Plan:     I have personally reviewed and noted the following in the patient's chart:   Medical and social history Use of alcohol, tobacco or illicit drugs  Current medications and supplements including opioid prescriptions. Patient is not currently taking opioid prescriptions. Functional  ability and status Nutritional status Physical activity Advanced directives List of other physicians Hospitalizations, surgeries, and ER visits in previous 12 months Vitals Screenings to include cognitive, depression, and falls Referrals and appointments  In addition, I have reviewed and discussed with patient certain preventive protocols, quality metrics, and best practice recommendations. A written personalized care plan for preventive services as well as general preventive health recommendations were provided to patient.     Sheral Flow, LPN   [id number]   Nurse Notes: Normal cognitive status assessed by direct observation by this Nurse Health Advisor. No abnormalities found.   Patient Medicare AWV questionnaire was completed by the patient on 01/06/2023; I have confirmed that all information answered by patient is correct and no changes since this date.

## 2023-01-10 NOTE — Patient Instructions (Signed)
Roger Kent , Thank you for taking time to come for your Medicare Wellness Visit. I appreciate your ongoing commitment to your health goals. Please review the following plan we discussed and let me know if I can assist you in the future.   These are the goals we discussed:  Goals      My goal for 2024 is to stay in routine & maintain my health.        This is a list of the screening recommended for you and due dates:  Health Maintenance  Topic Date Due   COVID-19 Vaccine (5 - 2023-24 season) 07/19/2022   DTaP/Tdap/Td vaccine (3 - Td or Tdap) 07/04/2023   Medicare Annual Wellness Visit  01/11/2024   Pneumonia Vaccine  Completed   Flu Shot  Completed   Hepatitis C Screening: USPSTF Recommendation to screen - Ages 18-79 yo.  Completed   Zoster (Shingles) Vaccine  Completed   HPV Vaccine  Aged Out   Colon Cancer Screening  Discontinued    Advanced directives: Yes  Conditions/risks identified: Yes  Next appointment: Follow up in one year for your annual wellness visit.   Preventive Care 30 Years and Older, Male  Preventive care refers to lifestyle choices and visits with your health care provider that can promote health and wellness. What does preventive care include? A yearly physical exam. This is also called an annual well check. Dental exams once or twice a year. Routine eye exams. Ask your health care provider how often you should have your eyes checked. Personal lifestyle choices, including: Daily care of your teeth and gums. Regular physical activity. Eating a healthy diet. Avoiding tobacco and drug use. Limiting alcohol use. Practicing safe sex. Taking low doses of aspirin every day. Taking vitamin and mineral supplements as recommended by your health care provider. What happens during an annual well check? The services and screenings done by your health care provider during your annual well check will depend on your age, overall health, lifestyle risk factors, and  family history of disease. Counseling  Your health care provider may ask you questions about your: Alcohol use. Tobacco use. Drug use. Emotional well-being. Home and relationship well-being. Sexual activity. Eating habits. History of falls. Memory and ability to understand (cognition). Work and work Statistician. Screening  You may have the following tests or measurements: Height, weight, and BMI. Blood pressure. Lipid and cholesterol levels. These may be checked every 5 years, or more frequently if you are over 6 years old. Skin check. Lung cancer screening. You may have this screening every year starting at age 33 if you have a 30-pack-year history of smoking and currently smoke or have quit within the past 15 years. Fecal occult blood test (FOBT) of the stool. You may have this test every year starting at age 89. Flexible sigmoidoscopy or colonoscopy. You may have a sigmoidoscopy every 5 years or a colonoscopy every 10 years starting at age 41. Prostate cancer screening. Recommendations will vary depending on your family history and other risks. Hepatitis C blood test. Hepatitis B blood test. Sexually transmitted disease (STD) testing. Diabetes screening. This is done by checking your blood sugar (glucose) after you have not eaten for a while (fasting). You may have this done every 1-3 years. Abdominal aortic aneurysm (AAA) screening. You may need this if you are a current or former smoker. Osteoporosis. You may be screened starting at age 59 if you are at high risk. Talk with your health care provider about your  test results, treatment options, and if necessary, the need for more tests. Vaccines  Your health care provider may recommend certain vaccines, such as: Influenza vaccine. This is recommended every year. Tetanus, diphtheria, and acellular pertussis (Tdap, Td) vaccine. You may need a Td booster every 10 years. Zoster vaccine. You may need this after age 34. Pneumococcal  13-valent conjugate (PCV13) vaccine. One dose is recommended after age 37. Pneumococcal polysaccharide (PPSV23) vaccine. One dose is recommended after age 10. Talk to your health care provider about which screenings and vaccines you need and how often you need them. This information is not intended to replace advice given to you by your health care provider. Make sure you discuss any questions you have with your health care provider. Document Released: 12/01/2015 Document Revised: 07/24/2016 Document Reviewed: 09/05/2015 Elsevier Interactive Patient Education  2017 [place] Prevention in the Home Falls can cause injuries. They can happen to people of all ages. There are many things you can do to make your home safe and to help prevent falls. What can I do on the outside of my home? Regularly fix the edges of walkways and driveways and fix any cracks. Remove anything that might make you trip as you walk through a door, such as a raised step or threshold. Trim any bushes or trees on the path to your home. Use bright outdoor lighting. Clear any walking paths of anything that might make someone trip, such as rocks or tools. Regularly check to see if handrails are loose or broken. Make sure that both sides of any steps have handrails. Any raised decks and porches should have guardrails on the edges. Have any leaves, snow, or ice cleared regularly. Use sand or salt on walking paths during winter. Clean up any spills in your garage right away. This includes oil or grease spills. What can I do in the bathroom? Use night lights. Install grab bars by the toilet and in the tub and shower. Do not use towel bars as grab bars. Use non-skid mats or decals in the tub or shower. If you need to sit down in the shower, use a plastic, non-slip stool. Keep the floor dry. Clean up any water that spills on the floor as soon as it happens. Remove soap buildup in the tub or shower regularly. Attach  bath mats securely with double-sided non-slip rug tape. Do not have throw rugs and other things on the floor that can make you trip. What can I do in the bedroom? Use night lights. Make sure that you have a light by your bed that is easy to reach. Do not use any sheets or blankets that are too big for your bed. They should not hang down onto the floor. Have a firm chair that has side arms. You can use this for support while you get dressed. Do not have throw rugs and other things on the floor that can make you trip. What can I do in the kitchen? Clean up any spills right away. Avoid walking on wet floors. Keep items that you use a lot in easy-to-reach places. If you need to reach something above you, use a strong step stool that has a grab bar. Keep electrical cords out of the way. Do not use floor polish or wax that makes floors slippery. If you must use wax, use non-skid floor wax. Do not have throw rugs and other things on the floor that can make you trip. What can I do with my  stairs? Do not leave any items on the stairs. Make sure that there are handrails on both sides of the stairs and use them. Fix handrails that are broken or loose. Make sure that handrails are as long as the stairways. Check any carpeting to make sure that it is firmly attached to the stairs. Fix any carpet that is loose or worn. Avoid having throw rugs at the top or bottom of the stairs. If you do have throw rugs, attach them to the floor with carpet tape. Make sure that you have a light switch at the top of the stairs and the bottom of the stairs. If you do not have them, ask someone to add them for you. What else can I do to help prevent falls? Wear shoes that: Do not have high heels. Have rubber bottoms. Are comfortable and fit you well. Are closed at the toe. Do not wear sandals. If you use a stepladder: Make sure that it is fully opened. Do not climb a closed stepladder. Make sure that both sides of the  stepladder are locked into place. Ask someone to hold it for you, if possible. Clearly mark and make sure that you can see: Any grab bars or handrails. First and last steps. Where the edge of each step is. Use tools that help you move around (mobility aids) if they are needed. These include: Canes. Walkers. Scooters. Crutches. Turn on the lights when you go into a dark area. Replace any light bulbs as soon as they burn out. Set up your furniture so you have a clear path. Avoid moving your furniture around. If any of your floors are uneven, fix them. If there are any pets around you, be aware of where they are. Review your medicines with your doctor. Some medicines can make you feel dizzy. This can increase your chance of falling. Ask your doctor what other things that you can do to help prevent falls. This information is not intended to replace advice given to you by your health care provider. Make sure you discuss any questions you have with your health care provider. Document Released: 08/31/2009 Document Revised: 04/11/2016 Document Reviewed: 12/09/2014 Elsevier Interactive Patient Education  2017 [employer].

## 2023-01-29 ENCOUNTER — Encounter: Payer: Self-pay | Admitting: Hematology & Oncology

## 2023-02-04 ENCOUNTER — Ambulatory Visit (HOSPITAL_COMMUNITY): Payer: Medicare Other | Attending: Internal Medicine

## 2023-02-04 DIAGNOSIS — I351 Nonrheumatic aortic (valve) insufficiency: Secondary | ICD-10-CM | POA: Diagnosis not present

## 2023-02-04 LAB — ECHOCARDIOGRAM COMPLETE
AR max vel: 2.05 cm2
AV Area VTI: 2.01 cm2
AV Area mean vel: 1.99 cm2
AV Mean grad: 6.5 mmHg
AV Peak grad: 12.2 mmHg
Ao pk vel: 1.75 m/s
Area-P 1/2: 3.95 cm2
P 1/2 time: 452 msec
S' Lateral: 2.2 cm

## 2023-02-11 ENCOUNTER — Encounter (INDEPENDENT_AMBULATORY_CARE_PROVIDER_SITE_OTHER): Payer: Medicare Other | Admitting: Ophthalmology

## 2023-02-11 DIAGNOSIS — I10 Essential (primary) hypertension: Secondary | ICD-10-CM | POA: Diagnosis not present

## 2023-02-11 DIAGNOSIS — H59031 Cystoid macular edema following cataract surgery, right eye: Secondary | ICD-10-CM | POA: Diagnosis not present

## 2023-02-11 DIAGNOSIS — H338 Other retinal detachments: Secondary | ICD-10-CM

## 2023-02-11 DIAGNOSIS — H43813 Vitreous degeneration, bilateral: Secondary | ICD-10-CM

## 2023-02-11 DIAGNOSIS — H35033 Hypertensive retinopathy, bilateral: Secondary | ICD-10-CM | POA: Diagnosis not present

## 2023-02-19 ENCOUNTER — Encounter: Payer: Self-pay | Admitting: Hematology & Oncology

## 2023-02-26 ENCOUNTER — Ambulatory Visit (INDEPENDENT_AMBULATORY_CARE_PROVIDER_SITE_OTHER): Payer: Medicare Other | Admitting: Internal Medicine

## 2023-02-26 ENCOUNTER — Encounter: Payer: Self-pay | Admitting: Internal Medicine

## 2023-02-26 VITALS — BP 122/78 | HR 89 | Ht 73.0 in | Wt 194.0 lb

## 2023-02-26 DIAGNOSIS — K2270 Barrett's esophagus without dysplasia: Secondary | ICD-10-CM

## 2023-02-26 DIAGNOSIS — K219 Gastro-esophageal reflux disease without esophagitis: Secondary | ICD-10-CM | POA: Diagnosis not present

## 2023-02-26 MED ORDER — ESOMEPRAZOLE MAGNESIUM 20 MG PO CPDR: 20.0000 mg | DELAYED_RELEASE_CAPSULE | Freq: Every day | ORAL | 3 refills | Status: DC

## 2023-02-26 NOTE — Progress Notes (Signed)
Chief Complaint: Iron deficiency anemia  HPI:    Roger Kent is a 77 year old male with history of hepatitis C (last checked as undetectable), Barrett's esophagus, DVT (01/04/2020 echo with an LVEF 60-65%), iron deficiency anemia presents for follow up of IDA and GERD  Interval History: He takes Nexium close to noon, usually after he eats. His BMs seem slightly loose and smell differently earlier in the day. His stools tend to be more solid later in the day. The stools are as close to black as he has ever seen.  Denies hematochezia or hematemesis. He is staying active and is working as a  English as a second language teacher. He is on oral iron supplements as prescribed with Dr. Myna Kent. He takes Mucinex to help with his reflux. He thinks that his reflux is 2/3s improved. Denies NSAID use.  Current Outpatient Medications  Medication Sig Dispense Refill   aspirin 325 MG tablet aspirin 325 mg tablet  Take 1 tablet every day by oral route.     Bromfenac Sodium 0.07 % SOLN Apply to eye.     Carboxymethylcellulose Sodium (EYE DROPS OP) Apply to eye.     cholecalciferol (VITAMIN D) 1000 UNITS tablet Take 1,000 Units by mouth every morning.     Cyanocobalamin (VITAMIN B 12 PO) Take by mouth.     Digestive Aids Mixture (DIGESTION GB PO) Take by mouth.     EPINEPHrine (EPIPEN 2-PAK) 0.3 mg/0.3 mL IJ SOAJ injection Inject 0.3 mLs (0.3 mg total) into the muscle as needed for anaphylaxis. 1 Device 3   Evolocumab (REPATHA SURECLICK) 140 MG/ML SOAJ inject 140 mg into the skin once every 14 days 6 mL 3   ferrous sulfate 325 (65 FE) MG tablet Take 1 tablet (325 mg total) by mouth daily with breakfast. 30 tablet 2   glucosamine-chondroitin 500-400 MG tablet Take 2 tablets by mouth every morning.      Lactobacillus (CVS ACIDOPHILUS PO) Take by mouth daily.     Multiple Vitamin (MULTIVITAMIN WITH MINERALS) TABS tablet Take 1 tablet by mouth every morning.     NON FORMULARY Advanced Bladder- [employer]     olmesartan (BENICAR) 40 MG tablet  TAKE 1 TABLET DAILY 90 tablet 3   olopatadine (PATANOL) 0.1 % ophthalmic solution Place 1 drop into both eyes daily at 6 (six) AM.     OVER THE COUNTER MEDICATION Take 3 capsules by mouth every morning. OTC herbal supplement for prostate health     OVER THE COUNTER MEDICATION Relief factor 3 times daily.     esomeprazole (NEXIUM) 20 MG capsule Take 1 capsule (20 mg total) by mouth daily. 30 minutes to an hour before meals 90 capsule 3   No current facility-administered medications for this visit.    Physical Exam:  Vital signs: BP 122/78   Pulse 89   Ht 6\' 1"  (1.854 m)   Wt 194 lb (88 kg)   BMI 25.60 kg/m    Constitutional:   Pleasant Caucasian male appears to be in NAD, Well developed, Well nourished, alert and cooperative HEENT:  Normocephalic and atraumatic. Respiratory: No increased WOB Cardiovascular:  Regular rate Abdomen: Non-tender, soft, non-distended Msk:  Symmetrical without gross deformities. Without edema, no deformity or joint abnormality.  Neurologic:  Alert and  oriented Psychiatric:  Demonstrates good judgement and reason without abnormal affect or behaviors.  RELEVANT LABS AND IMAGING: CBC    Component Value Date/Time   WBC 7.0 11/05/2022 1018   WBC 5.3 08/01/2020 0925   RBC 4.80 11/05/2022 1019  RBC 4.85 11/05/2022 1018   HGB 14.7 11/05/2022 1018   HCT 45.2 11/05/2022 1018   PLT 232 11/05/2022 1018   MCV 93.2 11/05/2022 1018   MCH 30.3 11/05/2022 1018   MCHC 32.5 11/05/2022 1018   RDW 14.9 11/05/2022 1018   LYMPHSABS 2.6 11/05/2022 1018   MONOABS 0.7 11/05/2022 1018   EOSABS 0.3 11/05/2022 1018   BASOSABS 0.0 11/05/2022 1018    CMP     Component Value Date/Time   NA 135 11/05/2022 1018   NA 140 05/10/2021 0842   K 4.4 11/05/2022 1018   CL 100 11/05/2022 1018   CO2 28 11/05/2022 1018   GLUCOSE 98 11/05/2022 1018   GLUCOSE 125 (H) 10/17/2006 1200   BUN 15 11/05/2022 1018   BUN 15 05/10/2021 0842   CREATININE 0.98 11/05/2022 1018    CREATININE 0.98 08/01/2020 0924   CALCIUM 9.5 11/05/2022 1018   PROT 7.9 11/05/2022 1018   PROT 6.8 03/19/2022 0924   ALBUMIN 4.6 11/05/2022 1018   ALBUMIN 4.1 03/19/2022 0924   AST 17 11/05/2022 1018   ALT 15 11/05/2022 1018   ALKPHOS 35 (L) 11/05/2022 1018   BILITOT 0.7 11/05/2022 1018   GFRNONAA >60 11/05/2022 1018   GFRNONAA 76 08/01/2020 0924   GFRAA 88 08/01/2020 0924   05/20/2013 colonoscopy with Dr. Kinnie Kent with diverticulosis and otherwise normal.  EGD 07/10/22: - Salmon-colored mucosa classified as Barrett's stage C1-M1 per [place] criteria. Biopsied. - 2 cm hiatal hernia. - Gastritis. Biopsied. - Normal examined duodenum. Biopsied. Path: 1. Surgical [P], 2nd portion of duodenum - DUODENAL MUCOSA SHOWING PATCHY LYMPHANGIECTASIA - NEGATIVE FOR INCREASED INTRAEPITHELIAL LYMPHOCYTES OR VILLOUS ARCHITECTURAL CHANGES 2. Surgical [P], gastric antrum and gastric body - GASTRIC ANTRAL AND OXYNTIC MUCOSA WITH MILD CHRONIC GASTRITIS, SEE NOTE 3. Surgical [P], GE junction - BARRETTS ESOPHAGUS, NEGATIVE FOR DYSPLASIA 2. Immunohistochemical stain for Helicobacter pylori is negative.  Colonoscopy 07/10/22: - The terminal ileum appeared normal. - Multiple small and large-mouthed diverticula were found in the sigmoid colon, descending colon, transverse colon and ascending colon. - Non-bleeding internal hemorrhoids were found during retroflexion.  Assessment: Iron deficiency anemia - Resolved Barrett's esophagus GERD History of gastritis Patient has been doing well for the most part.  He has noticed that his stools have turned black, but I suspect that this is due to use of oral iron supplements.  His iron deficiency anemia has resolved on IV and oral iron supplementation.  Patient has had significant improvement in his acid reflux control, but he still has some residual symptoms.  I will optimize the use of his PPI (instructed him to take this medication before meals) to see if this  resolves his residual breakthrough reflux.  - Previously gave GERD handout - Cont Nexium 20 mg QD. Will refill. Recommended that he takes it 30-60 min before meals. - Okay to continue using PRN antacids - Next EGD is due in 06/2025 for Barrett's surveillance if patient is interested - RTC in 1 year  Roger Pont, MD [facility] 02/26/2023, 4:52 PM  I spent 41 minutes of time, including in depth chart review, independent review of results as outlined above, communicating results with the patient directly, face-to-face time with the patient, coordinating care, ordering studies and medications as appropriate, and documentation.

## 2023-02-26 NOTE — Patient Instructions (Addendum)
Follow up in 1 year   _______________________________________________________  If your blood pressure at your visit was 140/90 or greater, please contact your primary care physician to follow up on this.  _______________________________________________________  If you are age 77 or older, your body mass index should be between 23-30. Your Body mass index is 25.6 kg/m. If this is out of the aforementioned range listed, please consider follow up with your Primary Care Provider.  If you are age 45 or younger, your body mass index should be between 19-25. Your Body mass index is 25.6 kg/m. If this is out of the aformentioned range listed, please consider follow up with your Primary Care Provider.   ________________________________________________________  The [facility] providers would like to encourage you to use [facility] to communicate with providers for non-urgent requests or questions.  Due to long hold times on the telephone, sending your provider a message by [facility] may be a faster and more efficient way to get a response.  Please allow 48 business hours for a response.  Please remember that this is for non-urgent requests.  _______________________________________________________   Thank you for entrusting me with your care and for choosing [facility], Dr. Eulah Pont

## 2023-03-11 ENCOUNTER — Encounter: Payer: Self-pay | Admitting: Family

## 2023-03-11 ENCOUNTER — Other Ambulatory Visit: Payer: Self-pay

## 2023-03-11 ENCOUNTER — Inpatient Hospital Stay (HOSPITAL_BASED_OUTPATIENT_CLINIC_OR_DEPARTMENT_OTHER): Payer: Medicare Other | Admitting: Family

## 2023-03-11 ENCOUNTER — Inpatient Hospital Stay: Payer: Medicare Other | Attending: Hematology & Oncology

## 2023-03-11 VITALS — BP 143/86 | HR 60 | Temp 98.0°F | Resp 17

## 2023-03-11 DIAGNOSIS — D6862 Lupus anticoagulant syndrome: Secondary | ICD-10-CM | POA: Insufficient documentation

## 2023-03-11 DIAGNOSIS — I471 Supraventricular tachycardia, unspecified: Secondary | ICD-10-CM | POA: Diagnosis not present

## 2023-03-11 DIAGNOSIS — Z7901 Long term (current) use of anticoagulants: Secondary | ICD-10-CM | POA: Insufficient documentation

## 2023-03-11 DIAGNOSIS — D509 Iron deficiency anemia, unspecified: Secondary | ICD-10-CM | POA: Diagnosis not present

## 2023-03-11 DIAGNOSIS — D500 Iron deficiency anemia secondary to blood loss (chronic): Secondary | ICD-10-CM

## 2023-03-11 DIAGNOSIS — I82509 Chronic embolism and thrombosis of unspecified deep veins of unspecified lower extremity: Secondary | ICD-10-CM

## 2023-03-11 DIAGNOSIS — I82551 Chronic embolism and thrombosis of right peroneal vein: Secondary | ICD-10-CM | POA: Insufficient documentation

## 2023-03-11 LAB — CBC WITH DIFFERENTIAL (CANCER CENTER ONLY)
Abs Immature Granulocytes: 0.07 10*3/uL (ref 0.00–0.07)
Basophils Absolute: 0 10*3/uL (ref 0.0–0.1)
Basophils Relative: 1 %
Eosinophils Absolute: 0.2 10*3/uL (ref 0.0–0.5)
Eosinophils Relative: 3 %
HCT: 43.1 % (ref 39.0–52.0)
Hemoglobin: 14.4 g/dL (ref 13.0–17.0)
Immature Granulocytes: 1 %
Lymphocytes Relative: 30 %
Lymphs Abs: 2.5 10*3/uL (ref 0.7–4.0)
MCH: 30.8 pg (ref 26.0–34.0)
MCHC: 33.4 g/dL (ref 30.0–36.0)
MCV: 92.1 fL (ref 80.0–100.0)
Monocytes Absolute: 0.9 10*3/uL (ref 0.1–1.0)
Monocytes Relative: 10 %
Neutro Abs: 4.6 10*3/uL (ref 1.7–7.7)
Neutrophils Relative %: 55 %
Platelet Count: 233 10*3/uL (ref 150–400)
RBC: 4.68 MIL/uL (ref 4.22–5.81)
RDW: 14 % (ref 11.5–15.5)
WBC Count: 8.3 10*3/uL (ref 4.0–10.5)
nRBC: 0 % (ref 0.0–0.2)

## 2023-03-11 LAB — CMP (CANCER CENTER ONLY)
ALT: 13 U/L (ref 0–44)
AST: 20 U/L (ref 15–41)
Albumin: 4.3 g/dL (ref 3.5–5.0)
Alkaline Phosphatase: 34 U/L — ABNORMAL LOW (ref 38–126)
Anion gap: 8 (ref 5–15)
BUN: 15 mg/dL (ref 8–23)
CO2: 26 mmol/L (ref 22–32)
Calcium: 9.2 mg/dL (ref 8.9–10.3)
Chloride: 100 mmol/L (ref 98–111)
Creatinine: 1.03 mg/dL (ref 0.61–1.24)
GFR, Estimated: >60 mL/min (ref >60)
Glucose, Bld: 94 mg/dL (ref 70–99)
Potassium: 4.4 mmol/L (ref 3.5–5.1)
Sodium: 134 mmol/L — ABNORMAL LOW (ref 135–145)
Total Bilirubin: 0.8 mg/dL (ref 0.3–1.2)
Total Protein: 7.6 g/dL (ref 6.5–8.1)

## 2023-03-11 LAB — IRON AND IRON BINDING CAPACITY (CC-WL,HP ONLY)
Iron: 108 ug/dL (ref 45–182)
Saturation Ratios: 35 % (ref 17.9–39.5)
TIBC: 311 ug/dL (ref 250–450)
UIBC: 203 ug/dL (ref 117–376)

## 2023-03-11 LAB — RETICULOCYTES
Immature Retic Fract: 11.7 % (ref 2.3–15.9)
RBC.: 4.76 MIL/uL (ref 4.22–5.81)
Retic Count, Absolute: 64.7 10*3/uL (ref 19.0–186.0)
Retic Ct Pct: 1.4 % (ref 0.4–3.1)

## 2023-03-11 LAB — FERRITIN: Ferritin: 87 ng/mL (ref 24–336)

## 2023-03-11 NOTE — Progress Notes (Signed)
Hematology and Oncology Follow Up Visit  Roger Kent [id number] 05-Sep-1946 77 y.o. 03/11/2023   Principle Diagnosis:  DVT of the right peroneal vein SVT of the right saphenous vein (+) lupus anti-coagulant Iron deficiency anemia   Current Therapy:        Xarelto 20 mg po q day -- 1 yr of therapeutic to complete in 05/2019 Xarelto 10 mg po q day -- maintenance x 54yr -- start 05/2019 -- completed on 06/2020 EC ASA 81 mg po q day -- started on 01/11/2019 -- changed to 325 mg po q day on 06/05/2020 IV iron as indicated    Interim History:  Roger Kent is here today for follow-up. He is doing quite well and has no complaints at this time.  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  No issues with blood loss. No abnormal bruising, no petechiae.  He is tolerating his 325 mg aspirin daily as prescribed.  No fever, chills, n/v, cough, rash, dizziness, SOB, chest pain, palpitations, abdominal pain or changes in bowel or bladder habits.  Swelling in the lower extremities unchanged from baseline. He wears compression stockings when needed. Pedal pulses are 1+.  No falls or syncope reported.  Appetite and hydration are good. Weight is stable at 191 lbs.   ECOG Performance Status: 0 - Asymptomatic  Medications:  Allergies as of 03/11/2023       Reactions   Oxycodone-acetaminophen Other (See Comments)   Other Reaction: agitation   Crestor [rosuvastatin]    Zetia [ezetimibe]    Brain fog, felt sluggish   Lamisil [terbinafine] Rash        Medication List        Accurate as of March 11, 2023 11:08 AM. If you have any questions, ask your nurse or doctor.          aspirin 325 MG tablet aspirin 325 mg tablet  Take 1 tablet every day by oral route.   Bromfenac Sodium 0.07 % Soln Apply to eye.   cholecalciferol 1000 units tablet Commonly known as: VITAMIN D Take 1,000 Units by mouth every morning.   CVS ACIDOPHILUS PO Take by  mouth daily.   DIGESTION GB PO Take by mouth.   EPINEPHrine 0.3 mg/0.3 mL Soaj injection Commonly known as: EpiPen 2-Pak Inject 0.3 mLs (0.3 mg total) into the muscle as needed for anaphylaxis.   esomeprazole 20 MG capsule Commonly known as: NexIUM Take 1 capsule (20 mg total) by mouth daily. 30 minutes to an hour before meals   EYE DROPS OP Apply to eye.   ferrous sulfate 325 (65 FE) MG tablet Take 1 tablet (325 mg total) by mouth daily with breakfast.   glucosamine-chondroitin 500-400 MG tablet Take 2 tablets by mouth every morning.   multivitamin with minerals Tabs tablet Take 1 tablet by mouth every morning.   NON FORMULARY Advanced Bladder- [employer]   olmesartan 40 MG tablet Commonly known as: BENICAR TAKE 1 TABLET DAILY   olopatadine 0.1 % ophthalmic solution Commonly known as: PATANOL Place 1 drop into both eyes daily at 6 (six) AM.   omeprazole-sodium bicarbonate 40-1100 MG capsule Commonly known as: ZEGERID Take 1 capsule by mouth daily before breakfast.   OVER THE COUNTER MEDICATION Relief factor 3 times daily.   OVER THE COUNTER MEDICATION Take 3 capsules by mouth every morning. OTC herbal supplement for prostate health   Repatha SureClick 140 MG/ML Soaj Generic drug: Evolocumab inject 140 mg into the  skin once every 14 days   VITAMIN B 12 PO Take by mouth.        Allergies:  Allergies  Allergen Reactions   Oxycodone-Acetaminophen Other (See Comments)    Other Reaction: agitation   Crestor [Rosuvastatin]    Zetia [Ezetimibe]     Brain fog, felt sluggish   Lamisil [Terbinafine] Rash    Past Medical History, Surgical history, Social history, and Family History were reviewed and updated.  Review of Systems: All other 10 point review of systems is negative.   Physical Exam:  oral temperature is 98 F (36.7 C). His blood pressure is 143/86 (abnormal) and his pulse is 60. His respiration is 17 and oxygen saturation is 97%.   Wt Readings  from Last 3 Encounters:  02/26/23 194 lb (88 kg)  01/10/23 196 lb 9.6 oz (89.2 kg)  01/07/23 195 lb 3.2 oz (88.5 kg)    Ocular: Sclerae unicteric, pupils equal, round and reactive to light Ear-nose-throat: Oropharynx clear, dentition fair Lymphatic: No cervical or supraclavicular adenopathy Lungs no rales or rhonchi, good excursion bilaterally Heart regular rate and rhythm, no murmur appreciated Abd soft, nontender, positive bowel sounds MSK no focal spinal tenderness, no joint edema Neuro: non-focal, well-oriented, appropriate affect Breasts: Deferred   Lab Results  Component Value Date   WBC 8.3 03/11/2023   HGB 14.4 03/11/2023   HCT 43.1 03/11/2023   MCV 92.1 03/11/2023   PLT 233 03/11/2023   Lab Results  Component Value Date   FERRITIN 119 11/05/2022   IRON 197 (H) 11/05/2022   TIBC 301 11/05/2022   UIBC 104 (L) 11/05/2022   IRONPCTSAT 65 (H) 11/05/2022   Lab Results  Component Value Date   RETICCTPCT 1.4 03/11/2023   RBC 4.68 03/11/2023   RBC 4.76 03/11/2023   No results found for: "KPAFRELGTCHN", "LAMBDASER", "KAPLAMBRATIO" No results found for: "IGGSERUM", "IGA", "IGMSERUM" Lab Results  Component Value Date   ALBUMINELP 4.1 04/16/2022   A1GS 0.3 04/16/2022   A2GS 0.6 04/16/2022   BETS 0.5 04/16/2022   BETA2SER 0.4 04/16/2022   GAMS 1.3 04/16/2022   SPEI  04/16/2022     Comment:     Normal Serum Protein Electrophoresis Pattern. No abnormal protein bands (M-protein) detected.      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  He will continue his full dose aspirin and iron supplement daily.  Follow-up in 6 months.   Eileen Stanford, NP 4/23/202411:08 AM

## 2023-04-01 ENCOUNTER — Telehealth: Payer: Self-pay

## 2023-04-01 ENCOUNTER — Encounter: Payer: Self-pay | Admitting: Hematology & Oncology

## 2023-04-01 ENCOUNTER — Other Ambulatory Visit (HOSPITAL_COMMUNITY): Payer: Self-pay

## 2023-04-01 NOTE — Telephone Encounter (Signed)
PA has been approved from 03/02/2023-03/31/2024  Approval letter has been attached in patients media

## 2023-04-01 NOTE — Telephone Encounter (Signed)
Pharmacy Patient Advocate Encounter   Received notification from [facility] that prior authorization for REPATHA 140MG /ML is required/requested.   PA submitted on 5.14.24 to (ins) [employer] via CoverMyMeds  Key or (Medicaid) confirmation # [id number]  Status is pending

## 2023-04-02 ENCOUNTER — Other Ambulatory Visit: Payer: Self-pay | Admitting: *Deleted

## 2023-04-02 DIAGNOSIS — I712 Thoracic aortic aneurysm, without rupture, unspecified: Secondary | ICD-10-CM

## 2023-04-03 ENCOUNTER — Telehealth: Payer: Self-pay | Admitting: Internal Medicine

## 2023-04-03 DIAGNOSIS — M25562 Pain in left knee: Secondary | ICD-10-CM | POA: Diagnosis not present

## 2023-04-03 DIAGNOSIS — G8929 Other chronic pain: Secondary | ICD-10-CM | POA: Diagnosis not present

## 2023-04-03 NOTE — Telephone Encounter (Signed)
Place surgical form in MD purple folder to complete.Marland KitchenRaechel Chute

## 2023-04-03 NOTE — Telephone Encounter (Signed)
Patient dropped off document Surgical Clearance, to be filled out by provider. Patient requested to send it via Fax within 7-days. Document is located in providers tray at front office.Please advise at Mobile [phone] (mobile)   Please fax to: [phone]

## 2023-04-04 NOTE — Telephone Encounter (Signed)
I have signed it.  Thank you

## 2023-04-04 NOTE — Telephone Encounter (Signed)
Called pt to inform him that his form has been completed by PCP. However, pt is needing to sign form before we can fax it to surgeon. I LVM asking pt to come into the office and sign form ASAP.

## 2023-04-15 NOTE — Telephone Encounter (Signed)
Patient is calling back stating the pharmacy is not filling this and would like a call back to discuss this and the card given for this medication. Requesting return call.

## 2023-04-15 NOTE — Telephone Encounter (Signed)
Called patient, he states that he went to pick up his Repatha over at [facility]- he states that [place] from there told him to call [phone]- he states he needs help getting a card, like he had before? I was unsure of this. Will route to pharmacy team to advise further.  Thanks!

## 2023-04-15 NOTE — Telephone Encounter (Signed)
I called the pharmacy to clarify. This phone number is from [employer] support center. Patient need to call the support center to get the new card. If he has reached his limit we do not have any other ways to get help for Repatha co-pay. 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

## 2023-04-17 ENCOUNTER — Telehealth: Payer: Self-pay | Admitting: Cardiology

## 2023-04-17 NOTE — Telephone Encounter (Signed)
Pt c/o medication issue:  1. Name of Medication:  Evolocumab (REPATHA SURECLICK) 140 MG/ML SOAJ   2. How are you currently taking this medication (dosage and times per day)? As prescribed  3. Are you having a reaction (difficulty breathing--STAT)? No   4. What is your medication issue? Copay card has expired so [facility] is not able to run it. He has received an email from Repatha for the assistance and filled it out, but is unsure what else is to be done.   Please advise.

## 2023-04-18 NOTE — Telephone Encounter (Signed)
Updated [employer]:  ID   [id number] BIN   [id number] PCN  [id number] GRP  [id number]  Exp 03/14/2024

## 2023-04-21 NOTE — Telephone Encounter (Signed)
Prolia VOB initiated via [email or web address]   Last Prolia inj: 11/07/22 Next Prolia inj DUE: 05/09/23 

## 2023-04-22 DIAGNOSIS — M25562 Pain in left knee: Secondary | ICD-10-CM | POA: Diagnosis not present

## 2023-04-22 DIAGNOSIS — M170 Bilateral primary osteoarthritis of knee: Secondary | ICD-10-CM | POA: Diagnosis not present

## 2023-04-22 DIAGNOSIS — M25561 Pain in right knee: Secondary | ICD-10-CM | POA: Diagnosis not present

## 2023-04-22 DIAGNOSIS — G8929 Other chronic pain: Secondary | ICD-10-CM | POA: Diagnosis not present

## 2023-04-23 ENCOUNTER — Other Ambulatory Visit (HOSPITAL_COMMUNITY): Payer: Self-pay

## 2023-04-23 NOTE — Telephone Encounter (Signed)
Called pt no answer LMOM coverage for prolia inj. Can received after 05/09/23 zero copay.Marland KitchenRaechel Chute

## 2023-04-23 NOTE — Telephone Encounter (Signed)
Pt ready for scheduling for Prolia on or after : 05/09/23   Out-of-pocket cost due at time of visit: $0   Primary: [place] Medicare Prolia co-insurance: 20% Admin fee co-insurance: 20%   Secondary: [employer] of [place] - FEP Prolia co-insurance: 100% Admin fee co-insurance: 100%   Medical Benefit Details: Date Benefits were checked: 04/22/23 Deductible: $240 ($240 met)/ Coinsurance: 100%/ Admin Fee: 100%   Prior Auth: no PA# Expiration Date:    Pharmacy benefit: Copay $476.71 If patient wants fill through the pharmacy benefit please send prescription to:  [facility] , and include estimated need by date in rx notes. Pharmacy will ship medication directly to the office.   Patient not eligible for Prolia Copay Card. Copay Card can make patient's cost as little as $25. Link to apply: [email or web address]   ** This summary of benefits is an estimation of the patient's out-of-pocket cost. Exact cost may very based on individual plan coverage.   

## 2023-04-24 ENCOUNTER — Telehealth: Payer: Self-pay | Admitting: Internal Medicine

## 2023-04-24 NOTE — Telephone Encounter (Signed)
Rec'd a msg on yesterday from Rx PA Team pt was approved and can get injection after 05/09/23, but not able to locate msg. Pls verify cost and attached to this msg...Raechel Chute

## 2023-04-24 NOTE — Telephone Encounter (Signed)
Pt ready for scheduling for Prolia on or after : 05/09/23   Out-of-pocket cost due at time of visit: $0   Primary: [place] Medicare Prolia co-insurance: 20% Admin fee co-insurance: 20%   Secondary: [employer] of [place] - FEP Prolia co-insurance: 100% Admin fee co-insurance: 100%   Medical Benefit Details: Date Benefits were checked: 04/22/23 Deductible: $240 ($240 met)/ Coinsurance: 100%/ Admin Fee: 100%   Prior Auth: no PA# Expiration Date:    Pharmacy benefit: Copay $476.71 If patient wants fill through the pharmacy benefit please send prescription to:  [facility] , and include estimated need by date in rx notes. Pharmacy will ship medication directly to the office.   Patient not eligible for Prolia Copay Card. Copay Card can make patient's cost as little as $25. Link to apply: [email or web address].[email or web address]   ** This summary of benefits is an estimation of the patient's out-of-pocket cost. Exact cost may very based on individual plan coverage.

## 2023-04-24 NOTE — Telephone Encounter (Signed)
Patient said that someone called him about his prolia shot and when it is due.  I do not see notes in the chart.  Please call patient and see if you can help him to get this scheduled.  I could not find the notes about the cost, etc.

## 2023-04-24 NOTE — Telephone Encounter (Signed)
Called pt no answer LMOM RTC concerning prolia.Marland KitchenShearon Stalls

## 2023-04-24 NOTE — Telephone Encounter (Signed)
Ok received a Benefit Verification sheet stating coverage approved For the MD purchase access option, benefits are subject to a 240 deductible ( 240 met) and 20% co-insurance for administration and cost of Prolia. There is no date for pt to  get Prolia.Marland Kitchen/L,B

## 2023-05-01 ENCOUNTER — Encounter: Payer: Self-pay | Admitting: Internal Medicine

## 2023-05-01 ENCOUNTER — Ambulatory Visit (INDEPENDENT_AMBULATORY_CARE_PROVIDER_SITE_OTHER): Payer: Medicare Other | Admitting: Internal Medicine

## 2023-05-01 VITALS — BP 130/82 | HR 71 | Temp 98.0°F | Ht 73.0 in | Wt 194.0 lb

## 2023-05-01 DIAGNOSIS — D509 Iron deficiency anemia, unspecified: Secondary | ICD-10-CM

## 2023-05-01 DIAGNOSIS — I10 Essential (primary) hypertension: Secondary | ICD-10-CM

## 2023-05-01 DIAGNOSIS — E785 Hyperlipidemia, unspecified: Secondary | ICD-10-CM | POA: Diagnosis not present

## 2023-05-01 DIAGNOSIS — R972 Elevated prostate specific antigen [PSA]: Secondary | ICD-10-CM

## 2023-05-01 DIAGNOSIS — I2583 Coronary atherosclerosis due to lipid rich plaque: Secondary | ICD-10-CM

## 2023-05-01 DIAGNOSIS — G629 Polyneuropathy, unspecified: Secondary | ICD-10-CM

## 2023-05-01 DIAGNOSIS — M170 Bilateral primary osteoarthritis of knee: Secondary | ICD-10-CM

## 2023-05-01 DIAGNOSIS — I2510 Atherosclerotic heart disease of native coronary artery without angina pectoris: Secondary | ICD-10-CM | POA: Diagnosis not present

## 2023-05-01 LAB — CBC WITH DIFFERENTIAL/PLATELET
Basophils Absolute: 0 10*3/uL (ref 0.0–0.1)
Basophils Relative: 0.5 % (ref 0.0–3.0)
Eosinophils Absolute: 0.2 10*3/uL (ref 0.0–0.7)
Eosinophils Relative: 2.9 % (ref 0.0–5.0)
HCT: 42.5 % (ref 39.0–52.0)
Hemoglobin: 14 g/dL (ref 13.0–17.0)
Lymphocytes Relative: 29.3 % (ref 12.0–46.0)
Lymphs Abs: 2.3 10*3/uL (ref 0.7–4.0)
MCHC: 32.9 g/dL (ref 30.0–36.0)
MCV: 94.1 fl (ref 78.0–100.0)
Monocytes Absolute: 0.7 10*3/uL (ref 0.1–1.0)
Monocytes Relative: 9.6 % (ref 3.0–12.0)
Neutro Abs: 4.5 10*3/uL (ref 1.4–7.7)
Neutrophils Relative %: 57.7 % (ref 43.0–77.0)
Platelets: 252 10*3/uL (ref 150.0–400.0)
RBC: 4.52 Mil/uL (ref 4.22–5.81)
RDW: 13.9 % (ref 11.5–15.5)
WBC: 7.7 10*3/uL (ref 4.0–10.5)

## 2023-05-01 LAB — PSA: PSA: 20.78 ng/mL — ABNORMAL HIGH (ref 0.10–4.00)

## 2023-05-01 LAB — LIPID PANEL
Cholesterol: 104 mg/dL (ref 0–200)
HDL: 45.1 mg/dL (ref >39.00)
LDL Cholesterol: 45 mg/dL (ref 0–99)
NonHDL: 59.18
Total CHOL/HDL Ratio: 2
Triglycerides: 69 mg/dL (ref 0.0–149.0)
VLDL: 13.8 mg/dL (ref 0.0–40.0)

## 2023-05-01 LAB — COMPREHENSIVE METABOLIC PANEL
ALT: 16 U/L (ref 0–53)
AST: 20 U/L (ref 0–37)
Albumin: 4.5 g/dL (ref 3.5–5.2)
Alkaline Phosphatase: 35 U/L — ABNORMAL LOW (ref 39–117)
BUN: 17 mg/dL (ref 6–23)
CO2: 29 mEq/L (ref 19–32)
Calcium: 9.6 mg/dL (ref 8.4–10.5)
Chloride: 102 mEq/L (ref 96–112)
Creatinine, Ser: 1.05 mg/dL (ref 0.40–1.50)
GFR: 68.61 mL/min (ref >60.00)
Glucose, Bld: 79 mg/dL (ref 70–99)
Potassium: 4.5 mEq/L (ref 3.5–5.1)
Sodium: 138 mEq/L (ref 135–145)
Total Bilirubin: 0.8 mg/dL (ref 0.2–1.2)
Total Protein: 7.8 g/dL (ref 6.0–8.3)

## 2023-05-01 LAB — URINALYSIS, ROUTINE W REFLEX MICROSCOPIC
Bilirubin Urine: NEGATIVE
Hgb urine dipstick: NEGATIVE
Ketones, ur: NEGATIVE
Nitrite: NEGATIVE
Specific Gravity, Urine: 1.015 (ref 1.000–1.030)
Total Protein, Urine: NEGATIVE
Urine Glucose: NEGATIVE
Urobilinogen, UA: 0.2 (ref 0.0–1.0)
pH: 7 (ref 5.0–8.0)

## 2023-05-01 LAB — TSH: TSH: 2.72 u[IU]/mL (ref 0.35–5.50)

## 2023-05-01 NOTE — Assessment & Plan Note (Signed)
OA - B knees (Dr Sherlean Foot) L>R
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
## 2023-05-01 NOTE — Assessment & Plan Note (Signed)
Xarelto Pravastatin

## 2023-05-01 NOTE — Assessment & Plan Note (Signed)
On Pravastatin, Xarelto

## 2023-05-01 NOTE — Progress Notes (Signed)
Subjective:  Patient ID: Roger Kent, male    DOB: [birth year]  Age: 77 y.o. MRN: [id number]  CC: Medical Clearance   HPI Kaimani Beesley Laing presents for OA - B knees (Dr Sherlean Foot), GERD, DVT  Outpatient Medications Prior to Visit  Medication Sig Dispense Refill   aspirin 325 MG tablet aspirin 325 mg tablet  Take 1 tablet every day by oral route.     Bromfenac Sodium 0.07 % SOLN Apply to eye.     Carboxymethylcellulose Sodium (EYE DROPS OP) Apply to eye.     cholecalciferol (VITAMIN D) 1000 UNITS tablet Take 1,000 Units by mouth every morning.     Cyanocobalamin (VITAMIN B 12 PO) Take by mouth.     Digestive Aids Mixture (DIGESTION GB PO) Take by mouth.     EPINEPHrine (EPIPEN 2-PAK) 0.3 mg/0.3 mL IJ SOAJ injection Inject 0.3 mLs (0.3 mg total) into the muscle as needed for anaphylaxis. 1 Device 3   esomeprazole (NEXIUM) 20 MG capsule Take 1 capsule (20 mg total) by mouth daily. 30 minutes to an hour before meals 90 capsule 3   Evolocumab (REPATHA SURECLICK) 140 MG/ML SOAJ inject 140 mg into the skin once every 14 days 6 mL 3   ferrous sulfate 325 (65 FE) MG tablet Take 1 tablet (325 mg total) by mouth daily with breakfast. 30 tablet 2   glucosamine-chondroitin 500-400 MG tablet Take 2 tablets by mouth every morning.      Lactobacillus ([employer] ACIDOPHILUS PO) Take by mouth daily.     Multiple Vitamin (MULTIVITAMIN WITH MINERALS) TABS tablet Take 1 tablet by mouth every morning.     NON FORMULARY Advanced Bladder- [employer]     olmesartan (BENICAR) 40 MG tablet TAKE 1 TABLET DAILY 90 tablet 3   olopatadine (PATANOL) 0.1 % ophthalmic solution Place 1 drop into both eyes daily at 6 (six) AM.     omeprazole-sodium bicarbonate (ZEGERID) 40-1100 MG capsule Take 1 capsule by mouth daily before breakfast.     OVER THE COUNTER MEDICATION Take 3 capsules by mouth every morning. OTC herbal supplement for prostate health     OVER THE COUNTER MEDICATION Relief factor 3 times daily.     No  facility-administered medications prior to visit.    ROS: Review of Systems  Constitutional:  Negative for appetite change, fatigue and unexpected weight change.  HENT:  Negative for congestion, nosebleeds, sneezing, sore throat and trouble swallowing.   Eyes:  Negative for itching and visual disturbance.  Respiratory:  Negative for cough.   Cardiovascular:  Negative for chest pain, palpitations and leg swelling.  Gastrointestinal:  Negative for abdominal distention, blood in stool, diarrhea and nausea.  Genitourinary:  Negative for frequency and hematuria.  Musculoskeletal:  Positive for arthralgias, back pain and gait problem. Negative for joint swelling and neck pain.  Skin:  Negative for rash.  Neurological:  Negative for dizziness, tremors, speech difficulty and weakness.  Psychiatric/Behavioral:  Negative for agitation, dysphoric mood and sleep disturbance. The patient is not nervous/anxious.     Objective:  BP 130/82 (BP Location: Right Arm, Patient Position: Sitting, Cuff Size: Large)   Pulse 71   Temp 98 F (36.7 C) (Oral)   Ht 6\' 1"  (1.854 m)   Wt 194 lb (88 kg)   SpO2 98%   BMI 25.60 kg/m   BP Readings from Last 3 Encounters:  05/01/23 130/82  03/11/23 (!) 143/86  02/26/23 122/78    Wt Readings from Last 3 Encounters:  05/01/23  194 lb (88 kg)  02/26/23 194 lb (88 kg)  01/10/23 196 lb 9.6 oz (89.2 kg)    Physical Exam Constitutional:      General: He is not in acute distress.    Appearance: Normal appearance. He is well-developed.     Comments: NAD  Eyes:     Conjunctiva/sclera: Conjunctivae normal.     Pupils: Pupils are equal, round, and reactive to light.  Neck:     Thyroid: No thyromegaly.     Vascular: No JVD.  Cardiovascular:     Rate and Rhythm: Normal rate and regular rhythm.     Heart sounds: Normal heart sounds. No murmur heard.    No friction rub. No gallop.  Pulmonary:     Effort: Pulmonary effort is normal. No respiratory distress.      Breath sounds: Normal breath sounds. No wheezing or rales.  Chest:     Chest wall: No tenderness.  Abdominal:     General: Bowel sounds are normal. There is no distension.     Palpations: Abdomen is soft. There is no mass.     Tenderness: There is no abdominal tenderness. There is no guarding or rebound.  Musculoskeletal:        General: Tenderness present. Normal range of motion.     Cervical back: Normal range of motion.     Right lower leg: No edema.     Left lower leg: No edema.  Lymphadenopathy:     Cervical: No cervical adenopathy.  Skin:    General: Skin is warm and dry.     Findings: No rash.  Neurological:     Mental Status: He is alert and oriented to person, place, and time.     Cranial Nerves: No cranial nerve deficit.     Motor: No abnormal muscle tone.     Coordination: Coordination normal.     Gait: Gait normal.     Deep Tendon Reflexes: Reflexes are normal and symmetric.  Psychiatric:        Behavior: Behavior normal.        Thought Content: Thought content normal.        Judgment: Judgment normal.   Knees w/pain  Lab Results  Component Value Date   WBC 8.3 03/11/2023   HGB 14.4 03/11/2023   HCT 43.1 03/11/2023   PLT 233 03/11/2023   GLUCOSE 94 03/11/2023   CHOL 94 (L) 03/19/2022   TRIG 55 03/19/2022   HDL 42 03/19/2022   LDLCALC 39 03/19/2022   ALT 13 03/11/2023   AST 20 03/11/2023   NA 134 (L) 03/11/2023   K 4.4 03/11/2023   CL 100 03/11/2023   CREATININE 1.03 03/11/2023   BUN 15 03/11/2023   CO2 26 03/11/2023   TSH 3.03 04/16/2022   PSA 32.27 (H) 03/19/2022   INR 2.4 RATIO (H) 09/17/2007   HGBA1C 5.8 12/29/2018    VAS [place] AAA DUPLEX  Result Date: 12/11/2022 ABDOMINAL AORTA STUDY Patient Name:  JOHAH MONTELONGO  Date of Exam:   12/09/2022 Medical Rec #: [id number]               Accession #:    [id number] Date of Birth: 02/20/1946               Patient Gender: M Patient Age:   40 years Exam Location:  [facility] Procedure:      VAS [place] AAA  DUPLEX Referring Phys: Arlys John CRENSHAW --------------------------------------------------------------------------------  Indications: Follow up to bilateral ectatic iliac  arteries. Patient has history              of back and joint pain due to osteoarthritis and degenerative disc              disease. Risk Factors: Hypertension, hyperlipidemia, past history of smoking. Other Factors: 4.1 cm ascending thoracic aortic aneurysm. Limitations: Air/bowel gas.  Comparison Study: Previous AAA duplex 12/12/20 showed prox aorta dimensions of                   2.8 x 3.0 cm, right common iliac dimensions of 1.8 x 1.7 cm,                   right external iliac dimensions of 1.9 x 1.8 cm, left common                   iliac dimensions of 1.6 x 1.6 cm, left external iliac                   dimensions of 1.0 x 1.0 cm. Performing Technologist: Olegario Shearer RVT  Examination Guidelines: A complete evaluation includes B-mode imaging, spectral Doppler, color Doppler, and power Doppler as needed of all accessible portions of each vessel. Bilateral testing is considered an integral part of a complete examination. Limited examinations for reoccurring indications may be performed as noted.  Abdominal Aorta Findings: +-------------+-------+----------+----------+---------+--------+--------+ Location     AP (cm)Trans (cm)PSV (cm/s)Waveform ThrombusComments +-------------+-------+----------+----------+---------+--------+--------+ Proximal     3.00   2.90      55        triphasic        ectatic  +-------------+-------+----------+----------+---------+--------+--------+ Mid          2.50   2.50      54        biphasic                  +-------------+-------+----------+----------+---------+--------+--------+ Distal       2.80   2.80      37        biphasic                  +-------------+-------+----------+----------+---------+--------+--------+ RT CIA Prox  1.8    1.9       46        triphasic         ectatic  +-------------+-------+----------+----------+---------+--------+--------+ RT CIA Mid                    35        triphasic                 +-------------+-------+----------+----------+---------+--------+--------+ RT CIA Distal                 39        biphasic                  +-------------+-------+----------+----------+---------+--------+--------+ RT EIA Prox                   58        biphasic                  +-------------+-------+----------+----------+---------+--------+--------+ RT EIA Mid                    93        triphasic                 +-------------+-------+----------+----------+---------+--------+--------+ RT  EIA Distal1.3    1.4       108       triphasic                 +-------------+-------+----------+----------+---------+--------+--------+ LT CIA Prox  1.8    1.8       39        triphasic                 +-------------+-------+----------+----------+---------+--------+--------+ LT CIA Mid   2.1    2.1       29        triphasic        fusiform +-------------+-------+----------+----------+---------+--------+--------+ LT CIA Distal                 23        biphasic                  +-------------+-------+----------+----------+---------+--------+--------+ LT EIA Prox                   71        triphasic                 +-------------+-------+----------+----------+---------+--------+--------+ LT EIA Mid                    65        triphasic                 +-------------+-------+----------+----------+---------+--------+--------+ LT EIA Distal1.5    1.4       84        triphasic                 +-------------+-------+----------+----------+---------+--------+--------+ The bilateral common and external iliac arteries are noted to be very tortuous. IVC/Iliac Findings: +--------+------+--------+--------+   IVC   PatentThrombusComments +--------+------+--------+--------+ IVC Proxpatent                  +--------+------+--------+--------+    Summary: Abdominal Aorta: The largest aortic measurement is 3.0 cm in the proximal segment with distal tapering. There is evidence of abnormal dilation of the right common iliac artery, left common iliac artery and left external iliac artery. The left common iliac artery dimension appears increased from prior exam. The largest aortic diameter remains essentially unchanged compared to prior exam. Previous diameter measurement was 3.0 cm obtained on 12/12/20. Stenosis: No evidence of stenosis seen throughout the aorta and bilateral iliac arteries. IVC/Iliac: There is no evidence of thrombus involving the IVC.  *See table(s) above for measurements and observations. Suggest follow up study in 12 months.  Electronically signed by Lorine Bears MD on 12/11/2022 at 8:06:54 AM.    Final     Assessment & Plan:   Problem List Items Addressed This Visit     Iron deficiency anemia (Chronic)    Monitoring CBC      Dyslipidemia    Xarelto Pravastatin      Relevant Orders   TSH   Lipid panel   Essential hypertension    On Benicar      Elevated PSA, greater than or equal to 20 ng/ml   Relevant Orders   Urinalysis   PSA   Coronary artery disease    On Pravastatin, Xarelto      Relevant Orders   CBC with Differential/Platelet   Neuropathy    Doing well - on PT, chiropractic treatments      Relevant Orders   CBC with Differential/Platelet   Knee osteoarthritis -  Primary     OA - B knees (Dr Sherlean Foot) L>R      Relevant Orders   CBC with Differential/Platelet   Comprehensive metabolic panel      No orders of the defined types were placed in this encounter.     Follow-up: Return in about 6 months (around 10/31/2023) for a follow-up visit.  Sonda Primes, MD

## 2023-05-01 NOTE — Assessment & Plan Note (Signed)
On Benicar 

## 2023-05-01 NOTE — Assessment & Plan Note (Addendum)
Doing well - on PT, chiropractic treatments

## 2023-05-01 NOTE — Assessment & Plan Note (Signed)
Monitoring CBC 

## 2023-05-04 ENCOUNTER — Encounter: Payer: Self-pay | Admitting: Cardiology

## 2023-05-06 DIAGNOSIS — R972 Elevated prostate specific antigen [PSA]: Secondary | ICD-10-CM | POA: Diagnosis not present

## 2023-05-07 ENCOUNTER — Encounter: Payer: Self-pay | Admitting: Internal Medicine

## 2023-05-07 ENCOUNTER — Telehealth: Payer: Self-pay | Admitting: Cardiology

## 2023-05-07 NOTE — Telephone Encounter (Signed)
Pls advise on email.../lmb 

## 2023-05-07 NOTE — Telephone Encounter (Signed)
Follow Up:     Patient's wife is calling to check on the status of patient's clearance.

## 2023-05-07 NOTE — Telephone Encounter (Signed)
Patient called and said he discussed this issue with Dr. Posey Rea at his visit on 05/01/23. He would like to know if the medication discussed can be sent to [facility] pharmacy. Best callback is [phone].

## 2023-05-07 NOTE — Telephone Encounter (Signed)
Returned call to pt's wife, Larita Fife.  She has been made aware that we have not received any surgical clearance request from anyone regarding any surgery.  She will call and see if they can send it to [place].  She was thankful for the call back.

## 2023-05-08 ENCOUNTER — Other Ambulatory Visit: Payer: Self-pay | Admitting: Internal Medicine

## 2023-05-08 MED ORDER — CEFUROXIME AXETIL 500 MG PO TABS: 500.0000 mg | ORAL_TABLET | Freq: Two times a day (BID) | ORAL | 0 refills | Status: AC

## 2023-05-09 ENCOUNTER — Telehealth: Payer: Self-pay | Admitting: *Deleted

## 2023-05-09 NOTE — Telephone Encounter (Signed)
   Name: Roger Kent  DOB: [birth year]  MRN: [id number]  Primary Cardiologist: Olga Millers, MD   Preoperative team, please contact this patient and set up a phone call appointment for further preoperative risk assessment. Please obtain consent and complete medication review. Thank you for your help.  I confirm that guidance regarding antiplatelet and oral anticoagulation therapy has been completed and, if necessary, noted below.  Patient's aspirin is not prescribed by cardiology.  Recommendations for holding aspirin will need to come from prescribing provider.   Ronney Asters, NP 05/09/2023, 11:39 AM [facility]

## 2023-05-09 NOTE — Telephone Encounter (Signed)
Left message to call back and schedule a tele pre op appt.  

## 2023-05-09 NOTE — Telephone Encounter (Signed)
   Pre-operative Risk Assessment    Patient Name: Roger Kent  DOB: [birth year] MRN: [id number]      Request for Surgical Clearance    Procedure:   LEFT TKA  Date of Surgery:  Clearance TBD                                 Surgeon:  DR. Georgena Spurling Surgeon's Group or Practice Name:  [facility]  Phone number:  [phone] ATTN: CHERYL LOVE Fax number:  [phone]   Type of Clearance Requested:   - Medical ; ASA    Type of Anesthesia:  Spinal   Additional requests/questions:    Elpidio Anis   05/09/2023, 11:27 AM

## 2023-05-12 ENCOUNTER — Ambulatory Visit
Admission: RE | Admit: 2023-05-12 | Discharge: 2023-05-12 | Disposition: A | Discharge status: Home / Self Care | Payer: Medicare Other | Source: Ambulatory Visit | Attending: Cardiology | Admitting: Cardiology

## 2023-05-12 ENCOUNTER — Telehealth: Payer: Self-pay | Admitting: *Deleted

## 2023-05-12 DIAGNOSIS — I700 Atherosclerosis of aorta: Secondary | ICD-10-CM | POA: Diagnosis not present

## 2023-05-12 DIAGNOSIS — I7121 Aneurysm of the ascending aorta, without rupture: Secondary | ICD-10-CM | POA: Diagnosis not present

## 2023-05-12 DIAGNOSIS — I712 Thoracic aortic aneurysm, without rupture, unspecified: Secondary | ICD-10-CM

## 2023-05-12 MED ORDER — IOPAMIDOL (ISOVUE-370) INJECTION 76%
75.0000 mL | Freq: Once | INTRAVENOUS | Status: AC | PRN
  Administered 2023-05-12: 75 mL via INTRAVENOUS

## 2023-05-12 NOTE — Telephone Encounter (Signed)
I was able to reach the pt today and he has been scheduled for tele pre op appt 05/16/23 @ 9:20. Med rec and consent are done.      Patient Consent for Virtual Visit        Roger Kent has provided verbal consent on 05/12/2023 for a virtual visit (video or telephone).   CONSENT FOR VIRTUAL VISIT FOR:  Roger Kent  By participating in this virtual visit I agree to the following:  I hereby voluntarily request, consent and authorize [facility] and its employed or contracted physicians, physician assistants, nurse practitioners or other licensed health care professionals (the Practitioner), to provide me with telemedicine health care services (the "Services") as deemed necessary by the treating Practitioner. I acknowledge and consent to receive the Services by the Practitioner via telemedicine. I understand that the telemedicine visit will involve communicating with the Practitioner through live audiovisual communication technology and the disclosure of certain medical information by electronic transmission. I acknowledge that I have been given the opportunity to request an in-person assessment or other available alternative prior to the telemedicine visit and am voluntarily participating in the telemedicine visit.  I understand that I have the right to withhold or withdraw my consent to the use of telemedicine in the course of my care at any time, without affecting my right to future care or treatment, and that the Practitioner or I may terminate the telemedicine visit at any time. I understand that I have the right to inspect all information obtained and/or recorded in the course of the telemedicine visit and may receive copies of available information for a reasonable fee.  I understand that some of the potential risks of receiving the Services via telemedicine include:  Delay or interruption in medical evaluation due to technological equipment failure or  disruption; Information transmitted may not be sufficient (e.g. poor resolution of images) to allow for appropriate medical decision making by the Practitioner; and/or  In rare instances, security protocols could fail, causing a breach of personal health information.  Furthermore, I acknowledge that it is my responsibility to provide information about my medical history, conditions and care that is complete and accurate to the best of my ability. I acknowledge that Practitioner's advice, recommendations, and/or decision may be based on factors not within their control, such as incomplete or inaccurate data provided by me or distortions of diagnostic images or specimens that may result from electronic transmissions. I understand that the practice of medicine is not an exact science and that Practitioner makes no warranties or guarantees regarding treatment outcomes. I acknowledge that a copy of this consent can be made available to me via my patient portal [facility] MyChart), or I can request a printed copy by calling the office of [facility].    I understand that my insurance will be billed for this visit.   I have read or had this consent read to me. I understand the contents of this consent, which adequately explains the benefits and risks of the Services being provided via telemedicine.  I have been provided ample opportunity to ask questions regarding this consent and the Services and have had my questions answered to my satisfaction. I give my informed consent for the services to be provided through the use of telemedicine in my medical care

## 2023-05-12 NOTE — Telephone Encounter (Signed)
I was able to reach the pt today and he has been scheduled for tele pre op appt 05/16/23 @ 9:20. Med rec and consent are done.

## 2023-05-13 DIAGNOSIS — R972 Elevated prostate specific antigen [PSA]: Secondary | ICD-10-CM | POA: Diagnosis not present

## 2023-05-13 DIAGNOSIS — N401 Enlarged prostate with lower urinary tract symptoms: Secondary | ICD-10-CM | POA: Diagnosis not present

## 2023-05-14 ENCOUNTER — Ambulatory Visit (INDEPENDENT_AMBULATORY_CARE_PROVIDER_SITE_OTHER): Payer: Medicare Other

## 2023-05-14 ENCOUNTER — Ambulatory Visit: Payer: Medicare Other | Attending: Rheumatology | Admitting: Rheumatology

## 2023-05-14 ENCOUNTER — Encounter: Payer: Self-pay | Admitting: Rheumatology

## 2023-05-14 VITALS — BP 139/90 | HR 71 | Resp 17 | Ht 71.0 in | Wt 194.0 lb

## 2023-05-14 DIAGNOSIS — M19072 Primary osteoarthritis, left ankle and foot: Secondary | ICD-10-CM | POA: Insufficient documentation

## 2023-05-14 DIAGNOSIS — M170 Bilateral primary osteoarthritis of knee: Secondary | ICD-10-CM | POA: Insufficient documentation

## 2023-05-14 DIAGNOSIS — M25552 Pain in left hip: Secondary | ICD-10-CM | POA: Insufficient documentation

## 2023-05-14 DIAGNOSIS — G629 Polyneuropathy, unspecified: Secondary | ICD-10-CM | POA: Diagnosis not present

## 2023-05-14 DIAGNOSIS — R350 Frequency of micturition: Secondary | ICD-10-CM | POA: Insufficient documentation

## 2023-05-14 DIAGNOSIS — N183 Chronic kidney disease, stage 3 unspecified: Secondary | ICD-10-CM | POA: Insufficient documentation

## 2023-05-14 DIAGNOSIS — I2510 Atherosclerotic heart disease of native coronary artery without angina pectoris: Secondary | ICD-10-CM | POA: Diagnosis not present

## 2023-05-14 DIAGNOSIS — G8929 Other chronic pain: Secondary | ICD-10-CM | POA: Diagnosis not present

## 2023-05-14 DIAGNOSIS — I10 Essential (primary) hypertension: Secondary | ICD-10-CM | POA: Diagnosis not present

## 2023-05-14 DIAGNOSIS — M542 Cervicalgia: Secondary | ICD-10-CM

## 2023-05-14 DIAGNOSIS — M25512 Pain in left shoulder: Secondary | ICD-10-CM | POA: Insufficient documentation

## 2023-05-14 DIAGNOSIS — Z8619 Personal history of other infectious and parasitic diseases: Secondary | ICD-10-CM | POA: Diagnosis not present

## 2023-05-14 DIAGNOSIS — Z86718 Personal history of other venous thrombosis and embolism: Secondary | ICD-10-CM | POA: Diagnosis not present

## 2023-05-14 DIAGNOSIS — M19042 Primary osteoarthritis, left hand: Secondary | ICD-10-CM | POA: Insufficient documentation

## 2023-05-14 DIAGNOSIS — Z8719 Personal history of other diseases of the digestive system: Secondary | ICD-10-CM | POA: Diagnosis not present

## 2023-05-14 DIAGNOSIS — M5136 Other intervertebral disc degeneration, lumbar region: Secondary | ICD-10-CM | POA: Diagnosis not present

## 2023-05-14 DIAGNOSIS — M810 Age-related osteoporosis without current pathological fracture: Secondary | ICD-10-CM

## 2023-05-14 DIAGNOSIS — M4156 Other secondary scoliosis, lumbar region: Secondary | ICD-10-CM | POA: Diagnosis not present

## 2023-05-14 DIAGNOSIS — M19071 Primary osteoarthritis, right ankle and foot: Secondary | ICD-10-CM | POA: Diagnosis not present

## 2023-05-14 DIAGNOSIS — N200 Calculus of kidney: Secondary | ICD-10-CM | POA: Diagnosis not present

## 2023-05-14 DIAGNOSIS — Q210 Ventricular septal defect: Secondary | ICD-10-CM | POA: Diagnosis not present

## 2023-05-14 DIAGNOSIS — K2270 Barrett's esophagus without dysplasia: Secondary | ICD-10-CM | POA: Diagnosis not present

## 2023-05-14 DIAGNOSIS — E785 Hyperlipidemia, unspecified: Secondary | ICD-10-CM | POA: Insufficient documentation

## 2023-05-14 DIAGNOSIS — I2583 Coronary atherosclerosis due to lipid rich plaque: Secondary | ICD-10-CM | POA: Diagnosis not present

## 2023-05-14 DIAGNOSIS — N401 Enlarged prostate with lower urinary tract symptoms: Secondary | ICD-10-CM | POA: Diagnosis present

## 2023-05-14 DIAGNOSIS — M19041 Primary osteoarthritis, right hand: Secondary | ICD-10-CM | POA: Diagnosis not present

## 2023-05-14 DIAGNOSIS — M436 Torticollis: Secondary | ICD-10-CM

## 2023-05-14 MED ORDER — DENOSUMAB 60 MG/ML ~~LOC~~ SOSY
60.0000 mg | PREFILLED_SYRINGE | Freq: Once | SUBCUTANEOUS | Status: AC
Start: 2023-05-14 — End: 2023-05-14
  Administered 2023-05-14: 60 mg via SUBCUTANEOUS

## 2023-05-14 NOTE — Patient Instructions (Signed)
Cervical Strain and Sprain Rehab Ask your health care provider which exercises are safe for you. Do exercises exactly as told by your health care provider and adjust them as directed. It is normal to feel mild stretching, pulling, tightness, or discomfort as you do these exercises. Stop right away if you feel sudden pain or your pain gets worse. Do not begin these exercises until told by your health care provider. Stretching and range-of-motion exercises Cervical side bending  Using good posture, sit on a stable chair or stand up. Without moving your shoulders, slowly tilt your left / right ear to your shoulder until you feel a stretch in the neck muscles on the opposite side. You should be looking straight ahead. Hold for __________ seconds. Repeat with the other side of your neck. Repeat __________ times. Complete this exercise __________ times a day. Cervical rotation  Using good posture, sit on a stable chair or stand up. Slowly turn your head to the side as if you are looking over your left / right shoulder. Keep your eyes level with the ground. Stop when you feel a stretch along the side and the back of your neck. Hold for __________ seconds. Repeat this by turning to your other side. Repeat __________ times. Complete this exercise __________ times a day. Thoracic extension and pectoral stretch  Roll a towel or a small blanket so it is about 4 inches (10 cm) in diameter. Lie down on your back on a firm surface. Put the towel in the middle of your back across your spine. It should not be under your shoulder blades. Put your hands behind your head and let your elbows fall out to your sides. Hold for __________ seconds. Repeat __________ times. Complete this exercise __________ times a day. Strengthening exercises Upper cervical flexion  Lie on your back with a thin pillow behind your head or a small, rolled-up towel under your neck. Gently tuck your chin toward your chest and nod  your head down to look toward your feet. Do not lift your head off the pillow. Hold for __________ seconds. Release the tension slowly. Relax your neck muscles completely before you repeat this exercise. Repeat __________ times. Complete this exercise __________ times a day. Cervical extension  Stand about 6 inches (15 cm) away from a wall, with your back facing the wall. Place a soft object, about 6-8 inches (15-20 cm) in diameter, between the back of your head and the wall. A soft object could be a small pillow, a ball, or a folded towel. Gently tilt your head back and press into the soft object. Keep your jaw and forehead relaxed. Hold for __________ seconds. Release the tension slowly. Relax your neck muscles completely before you repeat this exercise. Repeat __________ times. Complete this exercise __________ times a day. Posture and body mechanics Body mechanics refer to the movements and positions of your body while you do your daily activities. Posture is part of body mechanics. Good posture and healthy body mechanics can help to relieve stress in your body's tissues and joints. Good posture means that your spine is in its natural S-curve position (your spine is neutral), your shoulders are pulled back slightly, and your head is not tipped forward. The following are general guidelines for using improved posture and body mechanics in your everyday activities. Sitting  When sitting, keep your spine neutral and keep your feet flat on the floor. Use a footrest, if needed, and keep your thighs parallel to the floor. Avoid rounding   your shoulders. Avoid tilting your head forward. When working at a desk or a computer, keep your desk at a height where your hands are slightly lower than your elbows. Slide your chair under your desk so you are close enough to maintain good posture. When working at a computer, place your monitor at a height where you are looking straight ahead and you do not have to  tilt your head forward or downward to look at the screen. Standing  When standing, keep your spine neutral and keep your feet about hip-width apart. Keep a slight bend in your knees. Your ears, shoulders, and hips should line up. When you do a task in which you stand in one place for a long time, place one foot up on a stable object that is 2-4 inches (5-10 cm) high, such as a footstool. This helps keep your spine neutral. Resting When lying down and resting, avoid positions that are most painful for you. Try to support your neck in a neutral position. You can use a contour pillow or a small rolled-up towel. Your pillow should support your neck but not push on it. This information is not intended to replace advice given to you by your health care provider. Make sure you discuss any questions you have with your health care provider. Document Revised: 05/27/2022 Document Reviewed: 05/27/2022 [employer] Patient Education  2024 [employer] Shoulder Exercises Ask your health care provider which exercises are safe for you. Do exercises exactly as told by your health care provider and adjust them as directed. It is normal to feel mild stretching, pulling, tightness, or discomfort as you do these exercises. Stop right away if you feel sudden pain or your pain gets worse. Do not begin these exercises until told by your health care provider. Stretching exercises External rotation and abduction This exercise is sometimes called corner stretch. The exercise rotates your arm outward (external rotation) and moves your arm out from your body (abduction). Stand in a doorway with one of your feet slightly in front of the other. This is called a staggered stance. If you cannot reach your forearms to the door frame, stand facing a corner of a room. Choose one of the following positions as told by your health care provider: Place your hands and forearms on the door frame above your head. Place your hands and forearms  on the door frame at the height of your head. Place your hands on the door frame at the height of your elbows. Slowly move your weight onto your front foot until you feel a stretch across your chest and in the front of your shoulders. Keep your head and chest upright and keep your abdominal muscles tight. Hold for __________ seconds. To release the stretch, shift your weight to your back foot. Repeat __________ times. Complete this exercise __________ times a day. Extension, standing  Stand and hold a broomstick, a cane, or a similar object behind your back. Your hands should be a little wider than shoulder-width apart. Your palms should face away from your back. Keeping your elbows straight and your shoulder muscles relaxed, move the stick away from your body until you feel a stretch in your shoulders (extension). Avoid shrugging your shoulders while you move the stick. Keep your shoulder blades tucked down toward the middle of your back. Hold for __________ seconds. Slowly return to the starting position. Repeat __________ times. Complete this exercise __________ times a day. Range-of-motion exercises Pendulum  Stand near a wall or a  surface that you can hold onto for balance. Bend at the waist and let your left / right arm hang straight down. Use your other arm to support you. Keep your back straight and do not lock your knees. Relax your left / right arm and shoulder muscles, and move your hips and your trunk so your left / right arm swings freely. Your arm should swing because of the motion of your body, not because you are using your arm or shoulder muscles. Keep moving your hips and trunk so your arm swings in the following directions, as told by your health care provider: Side to side. Forward and backward. In clockwise and counterclockwise circles. Continue each motion for __________ seconds, or for as long as told by your health care provider. Slowly return to the starting  position. Repeat __________ times. Complete this exercise __________ times a day. Shoulder flexion, standing  Stand and hold a broomstick, a cane, or a similar object. Place your hands a little more than shoulder-width apart on the object. Your left / right hand should be palm-up, and your other hand should be palm-down. Keep your elbow straight and your shoulder muscles relaxed. Push the stick up with your healthy arm to raise your left / right arm in front of your body, and then over your head until you feel a stretch in your shoulder (flexion). Avoid shrugging your shoulder while you raise your arm. Keep your shoulder blade tucked down toward the middle of your back. Hold for __________ seconds. Slowly return to the starting position. Repeat __________ times. Complete this exercise __________ times a day. Shoulder abduction, standing  Stand and hold a broomstick, a cane, or a similar object. Place your hands a little more than shoulder-width apart on the object. Your left / right hand should be palm-up, and your other hand should be palm-down. Keep your elbow straight and your shoulder muscles relaxed. Push the object across your body toward your left / right side. Raise your left / right arm to the side of your body (abduction) until you feel a stretch in your shoulder. Do not raise your arm above shoulder height unless your health care provider tells you to do that. If directed, raise your arm over your head. Avoid shrugging your shoulder while you raise your arm. Keep your shoulder blade tucked down toward the middle of your back. Hold for __________ seconds. Slowly return to the starting position. Repeat __________ times. Complete this exercise __________ times a day. Internal rotation  Place your left / right hand behind your back, palm-up. Use your other hand to dangle an exercise band, a broomstick, or a similar object over your shoulder. Grasp the band with your left / right hand so  you are holding on to both ends. Gently pull up on the band until you feel a stretch in the front of your left / right shoulder. The movement of your arm toward the center of your body is called internal rotation. Avoid shrugging your shoulder while you raise your arm. Keep your shoulder blade tucked down toward the middle of your back. Hold for __________ seconds. Release the stretch by letting go of the band and lowering your hands. Repeat __________ times. Complete this exercise __________ times a day. Strengthening exercises External rotation  Sit in a stable chair without armrests. Secure an exercise band to a stable object at elbow height on your left / right side. Place a soft object, such as a folded towel or a small  pillow, between your left / right upper arm and your body to move your elbow about 4 inches (10 cm) away from your side. Hold the end of the exercise band so it is tight and there is no slack. Keeping your elbow pressed against the soft object, slowly move your forearm out, away from your abdomen (external rotation). Keep your body steady so only your forearm moves. Hold for __________ seconds. Slowly return to the starting position. Repeat __________ times. Complete this exercise __________ times a day. Shoulder abduction  Sit in a stable chair without armrests, or stand up. Hold a __________ lb / kg weight in your left / right hand, or hold an exercise band with both hands. Start with your arms straight down and your left / right palm facing in, toward your body. Slowly lift your left / right hand out to your side (abduction). Do not lift your hand above shoulder height unless your health care provider tells you that this is safe. Keep your arms straight. Avoid shrugging your shoulder while you do this movement. Keep your shoulder blade tucked down toward the middle of your back. Hold for __________ seconds. Slowly lower your arm, and return to the starting  position. Repeat __________ times. Complete this exercise __________ times a day. Shoulder extension  Sit in a stable chair without armrests, or stand up. Secure an exercise band to a stable object in front of you so it is at shoulder height. Hold one end of the exercise band in each hand. Straighten your elbows and lift your hands up to shoulder height. Squeeze your shoulder blades together as you pull your hands down to the sides of your thighs (extension). Stop when your hands are straight down by your sides. Do not let your hands go behind your body. Hold for __________ seconds. Slowly return to the starting position. Repeat __________ times. Complete this exercise __________ times a day. Shoulder row  Sit in a stable chair without armrests, or stand up. Secure an exercise band to a stable object in front of you so it is at chest height. Hold one end of the exercise band in each hand. Position your palms so that your thumbs are facing the ceiling (neutral position). Bend each of your elbows to a 90-degree angle (right angle) and keep your upper arms at your sides. Step back or move the chair back until the band is tight and there is no slack. Slowly pull your elbows back behind you. Hold for __________ seconds. Slowly return to the starting position. Repeat __________ times. Complete this exercise __________ times a day. Shoulder press-ups  Sit in a stable chair that has armrests. Sit upright, with your feet flat on the floor. Put your hands on the armrests so your elbows are bent and your fingers are pointing forward. Your hands should be about even with the sides of your body. Push down on the armrests and use your arms to lift yourself off the chair. Straighten your elbows and lift yourself up as much as you comfortably can. Move your shoulder blades down, and avoid letting your shoulders move up toward your ears. Keep your feet on the ground. As you get stronger, your feet should  support less of your body weight as you lift yourself up. Hold for __________ seconds. Slowly lower yourself back into the chair. Repeat __________ times. Complete this exercise __________ times a day. Wall push-ups  Stand so you are facing a stable wall. Your feet should be about  one arm-length away from the wall. Lean forward and place your palms on the wall at shoulder height. Keep your feet flat on the floor as you bend your elbows and lean forward toward the wall. Hold for __________ seconds. Straighten your elbows to push yourself back to the starting position. Repeat __________ times. Complete this exercise __________ times a day. This information is not intended to replace advice given to you by your health care provider. Make sure you discuss any questions you have with your health care provider. Document Revised: 12/25/2021 Document Reviewed: 12/25/2021 Else[employer] Patient Education  2024 [employer] Hip Exercises Ask your health care provider which exercises are safe for you. Do exercises exactly as told by your provider and adjust them as told. It is normal to feel mild stretching, pulling, tightness, or discomfort as you do these exercises. Stop right away if you feel sudden pain or your pain gets worse. Do not begin these exercises until told by your provider. Stretching and range-of-motion exercises These exercises warm up your muscles and joints and improve the movement and flexibility of your hip. They also help to relieve pain, numbness, and tingling. You may be asked to limit your range of motion if you had a hip replacement. Talk to your provider about these limits. Hamstrings, supine  Lie on your back (supine position). Loop a belt, towel, or exercise band over the ball of your left / right foot. The ball of your foot is on the walking surface, right under your toes. Straighten your left / right knee and slowly pull on the belt, towel, or band to raise your leg until you  feel a gentle stretch behind your knee (hamstring). Do not let your knee bend while you do this. Keep your other leg flat on the floor. Hold this position for __________ seconds. Slowly return your leg to the starting position. Repeat __________ times. Complete this exercise __________ times a day. Hip rotation  Lie on your back on a firm surface. With your left / right hand, gently pull your left / right knee toward the shoulder that is on the same side of the body. Stop when your knee is pointing toward the ceiling. Hold your left / right ankle with your other hand. Keeping your knee steady, gently pull your left / right ankle toward your other shoulder until you feel a stretch in your butt. Keep your hips and shoulders firmly planted while you do this stretch. Hold this position for __________ seconds. Repeat __________ times. Complete this exercise __________ times a day. Seated stretch This exercise is sometimes called hamstrings and adductors stretch. Sit on the floor with your legs stretched wide. Keep your knees straight during this exercise. Keeping your head and back in a straight line, bend at your waist to reach for your left foot (position A). You should feel a stretch in your right inner thigh (adductors). Hold this position for __________ seconds. Then slowly return to the upright position. Keeping your head and back in a straight line, bend at your waist to reach forward (position B). You should feel a stretch behind both of your thighs and knees (hamstrings). Hold this position for __________ seconds. Then slowly return to the upright position. Keeping your head and back in a straight line, bend at your waist to reach for your right foot (position C). You should feel a stretch in your left inner thigh (adductors). Hold this position for __________ seconds. Then slowly return to the upright position. Repeat  __________ times. Complete this exercise __________ times a  day. Lunge This exercise stretches the muscles of the hip (hip flexors). Place your left / right knee on the floor and bend your other knee so that is directly over your ankle. You should be half-kneeling. Keep good posture with your head over your shoulders. Tighten your butt muscles to point your tailbone downward. This will prevent your back from arching too much. You should feel a gentle stretch in the front of your left / right thigh and hip. If you do not feel a stretch, slide your other foot forward slightly and then slowly lunge forward with your chest up until your knee once again lines up over your ankle. Make sure your tailbone continues to point downward. Hold this position for __________ seconds. Slowly return to the starting position. Repeat __________ times. Complete this exercise __________ times a day. Strengthening exercises These exercises build strength and endurance in your hip. Endurance is the ability to use your muscles for a long time, even after they get tired. Bridge This exercise strengthens the muscles of your hip (hip extensors). Lie on your back on a firm surface with your knees bent and your feet flat on the floor. Tighten your butt muscles and lift your bottom off the floor until the trunk of your body and your hips are level with your thighs. Do not arch your back. You should feel the muscles working in your butt and the back of your thighs. If you do not feel these muscles, slide your feet 1-2 inches (2.5-5 cm) farther away from your butt. Hold this position for __________ seconds. Slowly lower your hips to the starting position. Let your muscles relax completely between repetitions. Repeat __________ times. Complete this exercise __________ times a day. Straight leg raises, side-lying This exercise strengthens the muscles that move the hip joint away from the center of the body (hip abductors). Lie on your side with your left / right leg in the top  position. Lie so your head, shoulder, hip, and knee line up. You may bend your bottom knee slightly to help you balance. Roll your hips slightly forward, so your hips are stacked directly over each other and your left / right knee is facing forward. Leading with your heel, lift your top leg 4-6 inches (10-15 cm). You should feel the muscles in your top hip lifting. Do not let your foot drift forward. Do not let your knee roll toward the ceiling. Hold this position for __________ seconds. Slowly return to the starting position. Let your muscles relax completely between repetitions. Repeat __________ times. Complete this exercise __________ times a day. Straight leg raises, side-lying This exercise strengthens the muscles that move the hip joint toward the center of the body (hip adductors). Lie on your side with your left / right leg in the bottom position. Lie so your head, shoulder, hip, and knee line up. You may place your upper foot in front to help you balance. Roll your hips slightly forward, so your hips are stacked directly over each other and your left / right knee is facing forward. Tense the muscles in your inner thigh and lift your bottom leg 4-6 inches (10-15 cm). Hold this position for __________ seconds. Slowly return to the starting position. Let your muscles relax completely between repetitions. Repeat __________ times. Complete this exercise __________ times a day. Straight leg raises, supine This exercise strengthens the muscles in the front of your thigh (quadriceps and hip flexors). Lorenz Coaster  on your back (supine position) with your left / right leg extended and your other knee bent. Tense the muscles in the front of your left / right thigh. You should see your kneecap slide up or see increased dimpling just above your knee. Keep these muscles tight as you raise your leg 4-6 inches (10-15 cm) off the floor. Do not let your knee bend. Hold this position for __________  seconds. Keep these muscles tense as you lower your leg. Relax the muscles slowly and completely between repetitions. Repeat __________ times. Complete this exercise __________ times a day. Hip abductors, standing This exercise strengthens the muscles that move the leg and hip joint away from the center of the body (hip abductors). Tie one end of a rubber exercise band or tubing to a secure surface, such as a chair, table, or pole. Loop the other end of the band or tubing around your left / right ankle. Keeping your ankle with the band or tubing directly opposite the secured end, step away until there is tension in the tubing or band. Hold on to a chair, table, or pole as needed for balance. Lift your left / right leg out to your side. While you do this: Keep your back upright. Keep your shoulders over your hips. Keep your toes pointing forward. Make sure to use your hip muscles to slowly lift your leg. Do not tip your body or forcefully lift your leg. Hold this position for __________ seconds. Slowly return to the starting position. Repeat __________ times. Complete this exercise __________ times a day. Squats This exercise strengthens the muscles in the front of your thigh (quadriceps). Stand in front of a table, or stand in a doorframe so your feet and knees are in line with the frame. You may place your hands on the table or frame for balance. Slowly bend your knees and lower your hips like you are going to sit in a chair. Keep your lower legs in a straight up-and-down position. Do not let your hips go lower than your knees. Do not bend your knees lower than told by your provider. If your hip pain increases, do not bend as low. Hold this position for ___________ seconds. Slowly push with your legs to return to standing. Do not use your hands to pull yourself to standing. Repeat __________ times. Complete this exercise __________ times a day. This information is not intended to replace  advice given to you by your health care provider. Make sure you discuss any questions you have with your health care provider. Document Revised: 07/09/2022 Document Reviewed: 07/09/2022 Elsevier Patient Education  2024 [employer].

## 2023-05-14 NOTE — Progress Notes (Addendum)
Pt received his prolia injection today and responded well to it.   Medical screening examination/treatment/procedure(s) were performed by non-physician practitioner and as supervising physician I was immediately available for consultation/collaboration.  I agree with above. Jacinta Shoe, MD

## 2023-05-15 ENCOUNTER — Encounter: Payer: Self-pay | Admitting: Internal Medicine

## 2023-05-15 NOTE — Telephone Encounter (Signed)
No MEDICAL CLEARANCE FORM...Roger Kent

## 2023-05-15 NOTE — Telephone Encounter (Signed)
Patient's wife called to check on the status of medical clearance being sent to the surgeon. She would like a call back at [phone].

## 2023-05-16 ENCOUNTER — Ambulatory Visit: Payer: Medicare Other | Attending: Cardiovascular Disease

## 2023-05-16 DIAGNOSIS — Z01810 Encounter for preprocedural cardiovascular examination: Secondary | ICD-10-CM

## 2023-05-16 NOTE — Progress Notes (Signed)
Virtual Visit via Telephone Note   Because of Roger Kent's co-morbid illnesses, he is at least at moderate risk for complications without adequate follow up.  This format is felt to be most appropriate for this patient at this time.  The patient did not have access to video technology/had technical difficulties with video requiring transitioning to audio format only (telephone).  All issues noted in this document were discussed and addressed.  No physical exam could be performed with this format.  Please refer to the patient's chart for his consent to telehealth for [facility].  Evaluation Performed:  Preoperative cardiovascular risk assessment _____________   Date:  05/16/2023   Patient ID:  Roger Kent, DOB 1946/08/11, MRN [id number] Patient Location:  Home Provider location:   Office  Primary Care Provider:  Tresa Garter, MD Primary Cardiologist:  Olga Millers, MD  Chief Complaint / Patient Profile   77 y.o. y/o male with a h/o congenital VSD, DVT, HLD, HTN, anxiety who is pending left total knee arthroplasty and presents today for telephonic preoperative cardiovascular risk assessment.  History of Present Illness    Roger Kent is a 77 y.o. male who presents via audio/video conferencing for a telehealth visit today.  Pt was last seen in cardiology clinic on 01/07/2023 by Dr. Jens Som.  At that time Roger Kent was doing well underwent repeat 2D echo for surveillance of VSD with no changes.  The patient is now pending procedure as outlined above. Since his last visit, he has been doing well with no new cardiac complaints.  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  Past Medical History    Past Medical History:  Diagnosis Date   Acute hepatitis C without mention of hepatic coma(070.51)    Allergic rhinitis, cause unspecified    Anxiety state, unspecified    Arthritis    Barrett's  esophagus    BPH (benign prostatic hyperplasia)    DVT (deep venous thrombosis) (HCC)    Elevated PSA    Dr Vernie Ammons   Esophageal reflux    Hiatal hernia    Hyperlipidemia    Hypertension    Hypertonicity of bladder    Insomnia    Iron deficiency anemia 04/30/2022   Iron malabsorption 04/30/2022   Leg DVT (deep venous thromboembolism), chronic, unspecified laterality (HCC) 09/21/2018   Osteoporosis, unspecified    Personal history of urinary calculi    Recent retinal detachment, partial, with giant tear    Unspecified tinnitus    Ventricular septal defect    Past Surgical History:  Procedure Laterality Date   COLONOSCOPY     KNEE SURGERY Bilateral    RETINAL DETACHMENT SURGERY     TOE SURGERY Left    great toe   UPPER GI ENDOSCOPY      Allergies  Allergies  Allergen Reactions   Oxycodone-Acetaminophen Other (See Comments)    Other Reaction: agitation   Crestor [Rosuvastatin]    Zetia [Ezetimibe]     Brain fog, felt sluggish   Lamisil [Terbinafine] Rash    Home Medications    Prior to Admission medications   Medication Sig Start Date End Date Taking? Authorizing Provider  aspirin 325 MG tablet aspirin 325 mg tablet  Take 1 tablet every day by oral route.    [provider]  Bromfenac Sodium 0.07 % SOLN Apply to eye. 02/17/23   [provider]  Carboxymethylcellulose Sodium (EYE DROPS OP) Apply to  eye. Patient not taking: Reported on 05/14/2023    [provider]  cefUROXime (CEFTIN) 500 MG tablet Take 1 tablet (500 mg total) by mouth 2 (two) times daily with a meal for 10 days. 05/08/23 05/18/23  Plotnikov, Georgina Quint, MD  cholecalciferol (VITAMIN D) 1000 UNITS tablet Take 1,000 Units by mouth every morning.    [provider]  Cyanocobalamin (VITAMIN B 12 PO) Take by mouth.    [provider]  Digestive Aids Mixture (DIGESTION GB PO) Take by mouth.    [provider]  EPINEPHrine (EPIPEN 2-PAK) 0.3 mg/0.3 mL IJ SOAJ  injection Inject 0.3 mLs (0.3 mg total) into the muscle as needed for anaphylaxis. Patient not taking: Reported on 05/14/2023 02/22/19   Plotnikov, Georgina Quint, MD  esomeprazole (NEXIUM) 20 MG capsule Take 1 capsule (20 mg total) by mouth daily. 30 minutes to an hour before meals 02/26/23   Imogene Burn, MD  Evolocumab (REPATHA SURECLICK) 140 MG/ML SOAJ inject 140 mg into the skin once every 14 days 08/19/22   Lewayne Bunting, MD  ferrous sulfate 325 (65 FE) MG tablet Take 1 tablet (325 mg total) by mouth daily with breakfast. Patient taking differently: Take 325 mg by mouth every 3 (three) days. 07/10/22   Imogene Burn, MD  glucosamine-chondroitin 500-400 MG tablet Take 2 tablets by mouth every morning.     [provider]  Lactobacillus (CVS ACIDOPHILUS PO) Take by mouth daily.    [provider]  Multiple Vitamin (MULTIVITAMIN WITH MINERALS) TABS tablet Take 1 tablet by mouth every morning.    [provider]  NON FORMULARY Advanced Bladder- Programmer, multimedia, Historical, MD  olmesartan (BENICAR) 40 MG tablet TAKE 1 TABLET DAILY 10/30/22   Plotnikov, Georgina Quint, MD  olopatadine (PATANOL) 0.1 % ophthalmic solution Place 1 drop into both eyes daily at 6 (six) AM.    [provider]  omeprazole-sodium bicarbonate (ZEGERID) 40-1100 MG capsule Take 1 capsule by mouth daily before breakfast. Patient not taking: Reported on 05/14/2023 03/25/22   [provider]  OVER THE COUNTER MEDICATION Take 3 capsules by mouth every morning. OTC herbal supplement for prostate health Patient not taking: Reported on 05/14/2023    [provider]  OVER THE COUNTER MEDICATION Relief factor 3 times daily.    [provider]  OVER THE COUNTER MEDICATION CBD Gummies (3 daily) (or oil)    [provider]    Physical Exam    Vital Signs:  Roger Kent does not have vital signs available for review today.  Given telephonic nature of  communication, physical exam is limited. AAOx3. NAD. Normal affect.  Speech and respirations are unlabored.  Accessory Clinical Findings    None  Assessment & Plan    1.  Preoperative Cardiovascular Risk Assessment:  Patient is RCRI score is 0.9%  The patient affirms he has been doing well without any new cardiac symptoms. They are able to achieve 6 METS without cardiac limitations. Therefore, based on ACC/AHA guidelines, the patient would be at acceptable risk for the planned procedure without further cardiovascular testing. The patient was advised that if he develops new symptoms prior to surgery to contact our office to arrange for a follow-up visit, and he verbalized understanding.   The patient was advised that if he develops new symptoms prior to surgery to contact our office to arrange for a follow-up visit, and he verbalized understanding.  Guidance for holding ASA 325 should  come from prescribing provider.  Patient is aware per our conversation today.   A copy of this note will be routed to requesting surgeon.  Time:   Today, I have spent 8 minutes with the patient with telehealth technology discussing medical history, symptoms, and management plan.     Napoleon Form, Leodis Rains, NP  05/16/2023, 6:59 AM

## 2023-05-21 ENCOUNTER — Telehealth: Payer: Self-pay | Admitting: Internal Medicine

## 2023-05-21 MED ORDER — DENOSUMAB 60 MG/ML ~~LOC~~ SOSY: 60.0000 mg | PREFILLED_SYRINGE | Freq: Once | SUBCUTANEOUS | 0 refills | Status: AC

## 2023-05-21 NOTE — Telephone Encounter (Signed)
Prescription Request  05/21/2023  LOV: 05/01/2023  What is the name of the medication or equipment? Prolia  Have you contacted your pharmacy to request a refill? Yes   Which pharmacy would you like this sent to?   [facility] - [place], [place] - [address] [facility] AT Portal to Registered [facility] [address] [place] [address] Phone: [phone] Fax: [phone]    Patient notified that their request is being sent to the clinical staff for review and that they should receive a response within 2 business days.   Please advise at Mobile [phone] (mobile)

## 2023-05-21 NOTE — Telephone Encounter (Signed)
Injection is due on Dec 26 or after. [facility] here for Dec shot...Raechel Chute

## 2023-05-26 ENCOUNTER — Telehealth: Payer: Self-pay | Admitting: Internal Medicine

## 2023-05-26 NOTE — Telephone Encounter (Signed)
Place surgical form on MD desk to sign.Marland KitchenRaechel Chute

## 2023-05-26 NOTE — Telephone Encounter (Signed)
Patient dropped off document Surgical Clearance, to be filled out by provider. Patient requested to send it back via Fax within 7-days. Document is located in providers tray at front office.Please advise at Mobile [phone] (mobile)   Please fax to: [phone]

## 2023-05-28 ENCOUNTER — Telehealth: Payer: Self-pay | Admitting: Internal Medicine

## 2023-05-28 NOTE — Telephone Encounter (Signed)
Form has been faxed twice w/ confirmation that office received. Also left msg w/ pt ok to pick up surgical clearance..Gordan Payment

## 2023-05-28 NOTE — Telephone Encounter (Signed)
Patient's wife called and said they picked up the surgical clearance form. They said they don't understand what is on it and would like a call back at [phone] to discuss.

## 2023-05-30 NOTE — Telephone Encounter (Signed)
I think I have signed it already Thx

## 2023-05-30 NOTE — Telephone Encounter (Signed)
Yes you did form was been fax to [phone].Marland KitchenShearon Stalls

## 2023-06-12 DIAGNOSIS — Z872 Personal history of diseases of the skin and subcutaneous tissue: Secondary | ICD-10-CM | POA: Diagnosis not present

## 2023-06-12 DIAGNOSIS — D229 Melanocytic nevi, unspecified: Secondary | ICD-10-CM | POA: Diagnosis not present

## 2023-06-12 DIAGNOSIS — Z85828 Personal history of other malignant neoplasm of skin: Secondary | ICD-10-CM | POA: Diagnosis not present

## 2023-06-12 DIAGNOSIS — R229 Localized swelling, mass and lump, unspecified: Secondary | ICD-10-CM | POA: Diagnosis not present

## 2023-06-12 DIAGNOSIS — Z08 Encounter for follow-up examination after completed treatment for malignant neoplasm: Secondary | ICD-10-CM | POA: Diagnosis not present

## 2023-06-12 DIAGNOSIS — L821 Other seborrheic keratosis: Secondary | ICD-10-CM | POA: Diagnosis not present

## 2023-06-12 DIAGNOSIS — L578 Other skin changes due to chronic exposure to nonionizing radiation: Secondary | ICD-10-CM | POA: Diagnosis not present

## 2023-06-12 DIAGNOSIS — L570 Actinic keratosis: Secondary | ICD-10-CM | POA: Diagnosis not present

## 2023-06-18 ENCOUNTER — Other Ambulatory Visit: Payer: Self-pay | Admitting: Internal Medicine

## 2023-06-18 DIAGNOSIS — M810 Age-related osteoporosis without current pathological fracture: Secondary | ICD-10-CM

## 2023-06-25 ENCOUNTER — Telehealth: Payer: Self-pay

## 2023-06-25 NOTE — Telephone Encounter (Signed)
Patient wife called stating pt is having knee replacement sx in October and wants to know if he needs to be on eliquis or something else during his sx. Discussed with Maralyn Sago carter who would like pt to come in late September early October to see a provider to get clearance and a plan for his sx. Left message for patient and scheduling message sent to move pt appt up.

## 2023-06-29 ENCOUNTER — Encounter: Payer: Self-pay | Admitting: Cardiology

## 2023-07-03 ENCOUNTER — Encounter (INDEPENDENT_AMBULATORY_CARE_PROVIDER_SITE_OTHER): Payer: Self-pay

## 2023-07-14 ENCOUNTER — Other Ambulatory Visit: Payer: Self-pay | Admitting: Cardiology

## 2023-07-14 DIAGNOSIS — E7800 Pure hypercholesterolemia, unspecified: Secondary | ICD-10-CM

## 2023-07-14 DIAGNOSIS — I2510 Atherosclerotic heart disease of native coronary artery without angina pectoris: Secondary | ICD-10-CM

## 2023-07-16 ENCOUNTER — Encounter: Payer: Self-pay | Admitting: Internal Medicine

## 2023-07-16 ENCOUNTER — Ambulatory Visit (INDEPENDENT_AMBULATORY_CARE_PROVIDER_SITE_OTHER): Payer: Medicare Other | Admitting: Internal Medicine

## 2023-07-16 VITALS — BP 130/70 | HR 73 | Temp 98.6°F | Ht 71.0 in | Wt 196.0 lb

## 2023-07-16 DIAGNOSIS — F439 Reaction to severe stress, unspecified: Secondary | ICD-10-CM | POA: Diagnosis not present

## 2023-07-16 DIAGNOSIS — I10 Essential (primary) hypertension: Secondary | ICD-10-CM

## 2023-07-16 MED ORDER — BUSPIRONE HCL 7.5 MG PO TABS: 7.5000 mg | ORAL_TABLET | Freq: Two times a day (BID) | ORAL | 3 refills | Status: DC

## 2023-07-16 NOTE — Progress Notes (Addendum)
Subjective:  Patient ID: Roger Kent, male    DOB: 03-18-1946  Age: 77 y.o. MRN: [id number]  CC: Hypertension   HPI Johnney Kirchoff Saxton presents for preop BP check, short temper issue  Outpatient Medications Prior to Visit  Medication Sig Dispense Refill  . aspirin 325 MG tablet aspirin 325 mg tablet  Take 1 tablet every day by oral route.    . Bromfenac Sodium 0.07 % SOLN Apply to eye.    . cholecalciferol (VITAMIN D) 1000 UNITS tablet Take 1,000 Units by mouth every morning.    . Cyanocobalamin (VITAMIN B 12 PO) Take by mouth.    . Digestive Aids Mixture (DIGESTION GB PO) Take by mouth.    . esomeprazole (NEXIUM) 20 MG capsule Take 1 capsule (20 mg total) by mouth daily. 30 minutes to an hour before meals 90 capsule 3  . Evolocumab (REPATHA SURECLICK) 140 MG/ML SOAJ INJECT 140MG  INTO THE SKIN ONCE EVERY 14 DAYS 6 mL 3  . ferrous sulfate 325 (65 FE) MG tablet Take 1 tablet (325 mg total) by mouth daily with breakfast. (Patient taking differently: Take 325 mg by mouth every 3 (three) days.) 30 tablet 2  . glucosamine-chondroitin 500-400 MG tablet Take 2 tablets by mouth every morning.     . Lactobacillus (CVS ACIDOPHILUS PO) Take by mouth daily.    . Multiple Vitamin (MULTIVITAMIN WITH MINERALS) TABS tablet Take 1 tablet by mouth every morning.    . NON FORMULARY Advanced Bladder- [employer]    . olmesartan (BENICAR) 40 MG tablet TAKE 1 TABLET DAILY 90 tablet 3  . olopatadine (PATANOL) 0.1 % ophthalmic solution Place 1 drop into both eyes daily at 6 (six) AM.    . OVER THE COUNTER MEDICATION Relief factor 3 times daily.    Marland Kitchen OVER THE COUNTER MEDICATION CBD Gummies (3 daily) (or oil)    . Carboxymethylcellulose Sodium (EYE DROPS OP) Apply to eye. (Patient not taking: Reported on 05/14/2023)    . EPINEPHrine (EPIPEN 2-PAK) 0.3 mg/0.3 mL IJ SOAJ injection Inject 0.3 mLs (0.3 mg total) into the muscle as needed for anaphylaxis. (Patient not taking: Reported on 05/14/2023) 1 Device  3  . omeprazole-sodium bicarbonate (ZEGERID) 40-1100 MG capsule Take 1 capsule by mouth daily before breakfast. (Patient not taking: Reported on 05/14/2023)    . OVER THE COUNTER MEDICATION Take 3 capsules by mouth every morning. OTC herbal supplement for prostate health (Patient not taking: Reported on 05/14/2023)     No facility-administered medications prior to visit.    ROS: Review of Systems  Constitutional:  Negative for appetite change, fatigue and unexpected weight change.  HENT:  Negative for congestion, nosebleeds, sneezing, sore throat and trouble swallowing.   Eyes:  Negative for itching and visual disturbance.  Respiratory:  Negative for cough.   Cardiovascular:  Negative for chest pain, palpitations and leg swelling.  Gastrointestinal:  Negative for abdominal distention, blood in stool, diarrhea and nausea.  Genitourinary:  Negative for frequency and hematuria.  Musculoskeletal:  Negative for back pain, gait problem, joint swelling and neck pain.  Skin:  Negative for rash.  Neurological:  Negative for dizziness, tremors, speech difficulty and weakness.  Psychiatric/Behavioral:  Negative for agitation, dysphoric mood and sleep disturbance. The patient is not nervous/anxious.     Objective:  BP 130/70 (BP Location: Left Arm, Patient Position: Sitting, Cuff Size: Normal)   Pulse 73   Temp 98.6 F (37 C) (Oral)   Ht 5\' 11"  (1.803 m)   Wt  196 lb (88.9 kg)   SpO2 94%   BMI 27.34 kg/m   BP Readings from Last 3 Encounters:  07/16/23 130/70  05/14/23 (!) 139/90  05/01/23 130/82    Wt Readings from Last 3 Encounters:  07/16/23 196 lb (88.9 kg)  05/14/23 194 lb (88 kg)  05/01/23 194 lb (88 kg)    Physical Exam Constitutional:      General: He is not in acute distress.    Appearance: He is well-developed.     Comments: NAD  Eyes:     Conjunctiva/sclera: Conjunctivae normal.     Pupils: Pupils are equal, round, and reactive to light.  Neck:     Thyroid: No  thyromegaly.     Vascular: No JVD.  Cardiovascular:     Rate and Rhythm: Normal rate and regular rhythm.     Heart sounds: Normal heart sounds. No murmur heard.    No friction rub. No gallop.  Pulmonary:     Effort: Pulmonary effort is normal. No respiratory distress.     Breath sounds: Normal breath sounds. No wheezing or rales.  Chest:     Chest wall: No tenderness.  Abdominal:     General: Bowel sounds are normal. There is no distension.     Palpations: Abdomen is soft. There is no mass.     Tenderness: There is no abdominal tenderness. There is no guarding or rebound.  Musculoskeletal:        General: No tenderness. Normal range of motion.     Cervical back: Normal range of motion.  Lymphadenopathy:     Cervical: No cervical adenopathy.  Skin:    General: Skin is warm and dry.     Findings: No rash.  Neurological:     Mental Status: He is alert and oriented to person, place, and time.     Cranial Nerves: No cranial nerve deficit.     Motor: No abnormal muscle tone.     Coordination: Coordination normal.     Gait: Gait normal.     Deep Tendon Reflexes: Reflexes are normal and symmetric.  Psychiatric:        Behavior: Behavior normal.        Thought Content: Thought content normal.        Judgment: Judgment normal.  LLE w/edema  Lab Results  Component Value Date   WBC 7.7 05/01/2023   HGB 14.0 05/01/2023   HCT 42.5 05/01/2023   PLT 252.0 05/01/2023   GLUCOSE 79 05/01/2023   CHOL 104 05/01/2023   TRIG 69.0 05/01/2023   HDL 45.10 05/01/2023   LDLCALC 45 05/01/2023   ALT 16 05/01/2023   AST 20 05/01/2023   NA 138 05/01/2023   K 4.5 05/01/2023   CL 102 05/01/2023   CREATININE 1.05 05/01/2023   BUN 17 05/01/2023   CO2 29 05/01/2023   TSH 2.72 05/01/2023   PSA 20.78 (H) 05/01/2023   INR 2.4 RATIO (H) 09/17/2007   HGBA1C 5.8 12/29/2018    CT ANGIO CHEST AORTA W &/OR WO CONTRAST  Result Date: 05/13/2023 CLINICAL DATA:  77 year old male with a history of aortic  aneurysm EXAM: CT ANGIOGRAPHY CHEST WITH CONTRAST TECHNIQUE: Multidetector CT imaging of the chest was performed using the standard protocol during bolus administration of intravenous contrast. Multiplanar CT image reconstructions and MIPs were obtained to evaluate the vascular anatomy. RADIATION DOSE REDUCTION: This exam was performed according to the departmental dose-optimization program which includes automated exposure control, adjustment of the mA and/or kV  according to patient size and/or use of iterative reconstruction technique. CONTRAST:  75mL ISOVUE-370 IOPAMIDOL (ISOVUE-370) INJECTION 76% COMPARISON:  05/13/2022, 05/14/2021, 05/09/2020 FINDINGS: Cardiovascular: Heart: No cardiomegaly. No pericardial fluid/thickening. Calcifications of left anterior descending and right coronary arteries. Aorta: Minimal aortic valve calcifications. Greatest estimated diameter of the aortic annulus 31 mm on the coronal reformatted images. Greatest estimated diameter of the sino-tubular junction, 31 mm on the coronal images. Greatest estimated diameter of the ascending aorta on the axial images, 43 mm. Prior diameter estimated 41 mm. Minimal aortic atherosclerosis of the thoracic aorta. Branch vessels are patent with a 3 vessel arch. Cervical cerebral vessels patent at the base of the neck. No pedunculated plaque, ulcerated plaque, dissection, periaortic fluid. No wall thickening. Pulmonary arteries: Timing of the contrast bolus is not optimized for evaluation of pulmonary artery filling defects. Unremarkable size of the main pulmonary artery. Mediastinum/Nodes: Calcified left mediastinal/hilar lymph nodes. Unremarkable appearance of the thoracic esophagus. Unremarkable appearance of the thoracic inlet. Lungs/Pleura: Calcified granuloma of the left lower lobe. Scarring/atelectasis at the dependent aspects of the lungs. No confluent airspace disease. No pneumothorax or pleural effusion. No enlarging or suspicious appearing  lung nodules. Upper Abdomen: No acute finding of the upper abdomen. Musculoskeletal: No acute displaced fracture. Degenerative changes of the spine. Review of the MIP images confirms the above findings. IMPRESSION: Essentially unchanged size and configuration of the ascending aorta, estimated 4.3 cm diameter on the current study. Minimal aortic atherosclerosis and associated coronary artery disease. Aortic Atherosclerosis (ICD10-I70.0). Signed, Yvone Neu. Miachel Roux, RPVI [facility] Radiology Electronically Signed   By: Gilmer Mor D.O.   On: 05/13/2023 16:21    Assessment & Plan:   Problem List Items Addressed This Visit     Essential hypertension - Primary    Chronic Nl BP at home On Benicar      Stress    We can try a Buspar low dose         Meds ordered this encounter  Medications  . busPIRone (BUSPAR) 7.5 MG tablet    Sig: Take 1 tablet (7.5 mg total) by mouth 2 (two) times daily.    Dispense:  60 tablet    Refill:  3      Follow-up: Return in about 3 months (around 10/16/2023) for a follow-up visit.  Sonda Primes, MD

## 2023-07-16 NOTE — Assessment & Plan Note (Signed)
Chronic Nl BP at home On Benicar

## 2023-07-16 NOTE — Assessment & Plan Note (Signed)
We can try a Buspar low dose

## 2023-07-18 ENCOUNTER — Other Ambulatory Visit: Payer: Self-pay

## 2023-07-18 DIAGNOSIS — E785 Hyperlipidemia, unspecified: Secondary | ICD-10-CM | POA: Diagnosis not present

## 2023-07-19 LAB — HEPATIC FUNCTION PANEL
ALT: 16 IU/L (ref 0–44)
AST: 23 IU/L (ref 0–40)
Albumin: 4.6 g/dL (ref 3.8–4.8)
Alkaline Phosphatase: 41 IU/L — ABNORMAL LOW (ref 44–121)
Bilirubin Total: 0.8 mg/dL (ref 0.0–1.2)
Bilirubin, Direct: 0.22 mg/dL (ref 0.00–0.40)
Total Protein: 7.4 g/dL (ref 6.0–8.5)

## 2023-07-19 LAB — LIPID PANEL
Chol/HDL Ratio: 2.4 ratio (ref 0.0–5.0)
Cholesterol, Total: 115 mg/dL (ref 100–199)
HDL: 47 mg/dL (ref >39)
LDL Chol Calc (NIH): 54 mg/dL (ref 0–99)
Triglycerides: 69 mg/dL (ref 0–149)
VLDL Cholesterol Cal: 14 mg/dL (ref 5–40)

## 2023-07-29 ENCOUNTER — Ambulatory Visit (INDEPENDENT_AMBULATORY_CARE_PROVIDER_SITE_OTHER): Payer: Medicare Other | Admitting: Radiology

## 2023-07-29 DIAGNOSIS — Z23 Encounter for immunization: Secondary | ICD-10-CM | POA: Diagnosis not present

## 2023-07-29 NOTE — Progress Notes (Cosign Needed)
Patient here for high dose flu shot. Tolerated well with no complications.   Medical screening examination/treatment/procedure(s) were performed by non-physician practitioner and as supervising physician I was immediately available for consultation/collaboration.  I agree with above. Jacinta Shoe, MD

## 2023-08-07 NOTE — Progress Notes (Signed)
Sent message, via epic in basket, requesting orders in epic from surgeon.  

## 2023-08-12 ENCOUNTER — Inpatient Hospital Stay: Payer: Medicare Other | Attending: Hematology & Oncology

## 2023-08-12 ENCOUNTER — Inpatient Hospital Stay (HOSPITAL_BASED_OUTPATIENT_CLINIC_OR_DEPARTMENT_OTHER): Payer: Medicare Other | Admitting: Hematology & Oncology

## 2023-08-12 ENCOUNTER — Encounter: Payer: Self-pay | Admitting: Hematology & Oncology

## 2023-08-12 ENCOUNTER — Other Ambulatory Visit: Payer: Self-pay | Admitting: Orthopedic Surgery

## 2023-08-12 VITALS — BP 151/79 | HR 70 | Temp 98.0°F | Resp 18 | Wt 188.1 lb

## 2023-08-12 DIAGNOSIS — D500 Iron deficiency anemia secondary to blood loss (chronic): Secondary | ICD-10-CM | POA: Diagnosis not present

## 2023-08-12 DIAGNOSIS — Z7901 Long term (current) use of anticoagulants: Secondary | ICD-10-CM | POA: Insufficient documentation

## 2023-08-12 DIAGNOSIS — G8929 Other chronic pain: Secondary | ICD-10-CM

## 2023-08-12 DIAGNOSIS — I82509 Chronic embolism and thrombosis of unspecified deep veins of unspecified lower extremity: Secondary | ICD-10-CM

## 2023-08-12 DIAGNOSIS — D6862 Lupus anticoagulant syndrome: Secondary | ICD-10-CM | POA: Insufficient documentation

## 2023-08-12 DIAGNOSIS — D509 Iron deficiency anemia, unspecified: Secondary | ICD-10-CM | POA: Insufficient documentation

## 2023-08-12 LAB — CMP (CANCER CENTER ONLY)
ALT: 13 U/L (ref 0–44)
AST: 19 U/L (ref 15–41)
Albumin: 4.6 g/dL (ref 3.5–5.0)
Alkaline Phosphatase: 32 U/L — ABNORMAL LOW (ref 38–126)
Anion gap: 8 (ref 5–15)
BUN: 16 mg/dL (ref 8–23)
CO2: 29 mmol/L (ref 22–32)
Calcium: 9.3 mg/dL (ref 8.9–10.3)
Chloride: 99 mmol/L (ref 98–111)
Creatinine: 1.01 mg/dL (ref 0.61–1.24)
GFR, Estimated: >60 mL/min (ref >60)
Glucose, Bld: 105 mg/dL — ABNORMAL HIGH (ref 70–99)
Potassium: 4.5 mmol/L (ref 3.5–5.1)
Sodium: 136 mmol/L (ref 135–145)
Total Bilirubin: 0.8 mg/dL (ref 0.3–1.2)
Total Protein: 7.7 g/dL (ref 6.5–8.1)

## 2023-08-12 LAB — CBC WITH DIFFERENTIAL (CANCER CENTER ONLY)
Abs Immature Granulocytes: 0.04 10*3/uL (ref 0.00–0.07)
Basophils Absolute: 0 10*3/uL (ref 0.0–0.1)
Basophils Relative: 1 %
Eosinophils Absolute: 0.2 10*3/uL (ref 0.0–0.5)
Eosinophils Relative: 2 %
HCT: 44.8 % (ref 39.0–52.0)
Hemoglobin: 14.7 g/dL (ref 13.0–17.0)
Immature Granulocytes: 1 %
Lymphocytes Relative: 33 %
Lymphs Abs: 2.7 10*3/uL (ref 0.7–4.0)
MCH: 30.9 pg (ref 26.0–34.0)
MCHC: 32.8 g/dL (ref 30.0–36.0)
MCV: 94.3 fL (ref 80.0–100.0)
Monocytes Absolute: 0.8 10*3/uL (ref 0.1–1.0)
Monocytes Relative: 9 %
Neutro Abs: 4.5 10*3/uL (ref 1.7–7.7)
Neutrophils Relative %: 54 %
Platelet Count: 255 10*3/uL (ref 150–400)
RBC: 4.75 MIL/uL (ref 4.22–5.81)
RDW: 13.5 % (ref 11.5–15.5)
WBC Count: 8.3 10*3/uL (ref 4.0–10.5)
nRBC: 0 % (ref 0.0–0.2)

## 2023-08-12 LAB — IRON AND IRON BINDING CAPACITY (CC-WL,HP ONLY)
Iron: 157 ug/dL (ref 45–182)
Saturation Ratios: 48 % — ABNORMAL HIGH (ref 17.9–39.5)
TIBC: 326 ug/dL (ref 250–450)
UIBC: 169 ug/dL (ref 117–376)

## 2023-08-12 LAB — FERRITIN: Ferritin: 77 ng/mL (ref 24–336)

## 2023-08-12 LAB — RETICULOCYTES
Immature Retic Fract: 10.9 % (ref 2.3–15.9)
RBC.: 4.74 MIL/uL (ref 4.22–5.81)
Retic Count, Absolute: 64.9 10*3/uL (ref 19.0–186.0)
Retic Ct Pct: 1.4 % (ref 0.4–3.1)

## 2023-08-12 MED ORDER — RIVAROXABAN 10 MG PO TABS: 10.0000 mg | ORAL_TABLET | Freq: Every day | ORAL | 0 refills | Status: DC

## 2023-08-12 NOTE — Progress Notes (Signed)
Hematology and Oncology Follow Up Visit  Kyra Sigona [id number] [birth year] 77 y.o. 08/12/2023   Principle Diagnosis:  DVT of the right peroneal vein SVT of the right saphenous vein (+) lupus anti-coagulant Iron deficiency anemia  Current Therapy:   Xarelto 20 mg po q day -- 1 yr of therapeutic to complete in 05/2019 Xarelto 10 mg po q day -- maintenance x 12yr -- start 05/2019 -- completed on 06/2020 EC ASA 81 mg po q day -- started on 01/11/2019 -- changed to 325 mg po q day on 06/05/2020 IV iron-Monoferric given on 05/03/2022     Interim History:  Mr. Alm is back for an early follow-up.  He is going to have the surgery for the left knee on October 7.  I want to make sure that he comes back to see [place] so that we can make sure that he is safe to have surgery and not have problems with blood clots.  He is doing well.  He feels well.  His left knee does tend to bother him.  He has had no cough or shortness of breath.  He is eating well.  There is no nausea or vomiting.  He has had no headache.  He has had history of iron deficiency in the past.  When we last saw him, his ferritin was 87 with an iron saturation of 35%.  He has had no problems with fever.  Thankfully, there is been no issues with COVID.  Overall, I would have said that his performance status is ECOG 1.    Medications:  Current Outpatient Medications:    Apoaequorin (PREVAGEN PO), Take 1 capsule by mouth daily. Pro, Disp: , Rfl:    aspirin 325 MG tablet, Take 325 mg by mouth daily., Disp: , Rfl:    Bromfenac Sodium 0.07 % SOLN, Place 1 drop into both eyes at bedtime., Disp: , Rfl:    busPIRone (BUSPAR) 7.5 MG tablet, Take 1 tablet (7.5 mg total) by mouth 2 (two) times daily., Disp: 60 tablet, Rfl: 3   cetirizine (ZYRTEC) 10 MG tablet, Take 10 mg by mouth daily., Disp: , Rfl:    Coenzyme Q10 (CO Q 10 PO), Take 300 mg by mouth daily., Disp: , Rfl:    COLLAGEN PO, Take 1 capsule by mouth daily. collagen  hydrolyzed, Disp: , Rfl:    Dextromethorphan-guaiFENesin (MUCINEX DM MAXIMUM STRENGTH) 60-1200 MG TB12, Take 1 tablet by mouth daily., Disp: , Rfl:    EPINEPHrine (EPIPEN 2-PAK) 0.3 mg/0.3 mL IJ SOAJ injection, Inject 0.3 mLs (0.3 mg total) into the muscle as needed for anaphylaxis., Disp: 1 Device, Rfl: 3   esomeprazole (NEXIUM) 20 MG capsule, Take 1 capsule (20 mg total) by mouth daily. 30 minutes to an hour before meals, Disp: 90 capsule, Rfl: 3   Evolocumab (REPATHA SURECLICK) 140 MG/ML SOAJ, INJECT 140MG  INTO THE SKIN ONCE EVERY 14 DAYS, Disp: 6 mL, Rfl: 3   ferrous sulfate 325 (65 FE) MG tablet, Take 1 tablet (325 mg total) by mouth daily with breakfast. (Patient taking differently: Take 325 mg by mouth daily.), Disp: 30 tablet, Rfl: 2   Glucosamine HCl (GLUCOSAMINE PO), Take 3,000 mg by mouth daily. MSM 1500 mg  1500 mg, Disp: , Rfl:    MAGNESIUM GLUCONATE PO, Take 120 mg by mouth daily., Disp: , Rfl:    olmesartan (BENICAR) 40 MG tablet, TAKE 1 TABLET DAILY, Disp: 90 tablet, Rfl: 3   OVER THE COUNTER MEDICATION, Take 1 Package by mouth  daily. Relief factor  4 tablet each package, Disp: , Rfl:    OVER THE COUNTER MEDICATION, Take 2 tablets by mouth daily. Better Bladder 840 mg, Disp: , Rfl:    OVER THE COUNTER MEDICATION, Take 1 tablet by mouth daily. Nitricoxide Niacin, Disp: , Rfl:    OVER THE COUNTER MEDICATION, Take 6 tablets by mouth daily. Super fruit 3 each Super veggie 3 each, Disp: , Rfl:    OVER THE COUNTER MEDICATION, Take 1 Scoop by mouth daily. Orgain organic protein powder Adds/ vital protein collagen peptides 2-4 table spoons, Disp: , Rfl:    OVER THE COUNTER MEDICATION, Take 2 tablets by mouth daily. Super beets chews, Disp: , Rfl:    OVER THE COUNTER MEDICATION, Place 1 application  into both nostrils daily. navage nasal care, Disp: , Rfl:    Probiotic Product (PROBIOTIC PO), Take 1 capsule by mouth daily. 60 billion, Disp: , Rfl:    simethicone (MYLICON) 125 MG chewable  tablet, Chew 125 mg by mouth every 6 (six) hours as needed for flatulence., Disp: , Rfl:    Sodium Hyaluronate, oral, (HYALURONIC ACID PO), Take 2,000 mg by mouth daily. 1000 mg each, Disp: , Rfl:   Allergies:  Allergies  Allergen Reactions   Oxycodone-Acetaminophen Other (See Comments)    Other Reaction: agitation   Crestor [Rosuvastatin] Other (See Comments)    Muscle pain   Zetia [Ezetimibe]     Brain fog, felt sluggish   Lamisil [Terbinafine] Rash    Past Medical History, Surgical history, Social history, and Family History were reviewed and updated.  Review of Systems: Review of Systems  Constitutional: Negative.   HENT:  Negative.    Eyes: Negative.   Respiratory: Negative.    Cardiovascular: Negative.   Gastrointestinal: Negative.   Endocrine: Negative.   Genitourinary: Negative.    Musculoskeletal: Negative.   Skin: Negative.   Neurological: Negative.   Hematological: Negative.   Psychiatric/Behavioral: Negative.      Physical Exam:  weight is 188 lb 1.3 oz (85.3 kg). His oral temperature is 98 F (36.7 C). His blood pressure is 151/79 (abnormal) and his pulse is 70. His respiration is 18 and oxygen saturation is 98%.   Wt Readings from Last 3 Encounters:  08/12/23 188 lb 1.3 oz (85.3 kg)  07/16/23 196 lb (88.9 kg)  05/14/23 194 lb (88 kg)    Physical Exam Vitals reviewed.  HENT:     Head: Normocephalic and atraumatic.  Eyes:     Pupils: Pupils are equal, round, and reactive to light.  Cardiovascular:     Rate and Rhythm: Normal rate and regular rhythm.     Heart sounds: Normal heart sounds.  Pulmonary:     Effort: Pulmonary effort is normal.     Breath sounds: Normal breath sounds.  Abdominal:     General: Bowel sounds are normal.     Palpations: Abdomen is soft.  Genitourinary:    Comments: Rectal exam showed no external hemorrhoids.  He had no mass in the rectal vault.  His stool was brown and heme negative. Musculoskeletal:        General: No  tenderness or deformity. Normal range of motion.     Cervical back: Normal range of motion.  Lymphadenopathy:     Cervical: No cervical adenopathy.  Skin:    General: Skin is warm and dry.     Findings: No erythema or rash.  Neurological:     Mental Status: He is alert and oriented  to person, place, and time.  Psychiatric:        Behavior: Behavior normal.        Thought Content: Thought content normal.        Judgment: Judgment normal.     Lab Results  Component Value Date   WBC 8.3 08/12/2023   HGB 14.7 08/12/2023   HCT 44.8 08/12/2023   MCV 94.3 08/12/2023   PLT 255 08/12/2023     Chemistry      Component Value Date/Time   NA 136 08/12/2023 1123   NA 140 05/10/2021 0842   K 4.5 08/12/2023 1123   CL 99 08/12/2023 1123   CO2 29 08/12/2023 1123   BUN 16 08/12/2023 1123   BUN 15 05/10/2021 0842   CREATININE 1.01 08/12/2023 1123   CREATININE 0.98 08/01/2020 0924      Component Value Date/Time   CALCIUM 9.3 08/12/2023 1123   ALKPHOS 32 (L) 08/12/2023 1123   AST 19 08/12/2023 1123   ALT 13 08/12/2023 1123   BILITOT 0.8 08/12/2023 1123       Impression and Plan: Mr. Vogele is a 77 year old white male.  He had a thrombus in the left leg about 6 years ago.  At this point, I do believe that he needs to be on some form of anticoagulation in the perioperative period.  I believe that he would do okay with Xarelto.  I would have her on Xarelto 10 mg.  I have him start tomorrow.  He would then take this for about 10 days and then finished up on 08/22/2023.  After his surgery, he would then restart Xarelto at 10 mg a day on 08/27/2023.  I think that he would be on Xarelto for 6 weeks.  I think this would be a reasonable timeframe for him to be on anticoagulation.  When he starts Xarelto, he will stop the full dose aspirin.  I told him to make sure he stops the full dose aspirin when he takes the Xarelto.  When he stops he Xarelto after 6 weeks from surgery, he will then go  back on to full dose aspirin.  He does understand this.  His wife was with him.  She understands the instructions.  I would like to see him back in about 2 months just to make sure that everything is doing okay.   Josph Macho, MD 9/24/202412:03 PM

## 2023-08-12 NOTE — Progress Notes (Signed)
Second request for pre op orders in [facility]: Left voicemail with Elnita Maxwell.

## 2023-08-13 NOTE — Patient Instructions (Signed)
DUE TO COVID-19 ONLY TWO VISITORS  (aged 77 and older)  ARE ALLOWED TO COME WITH YOU AND STAY IN THE WAITING ROOM ONLY DURING PRE OP AND PROCEDURE.   **NO VISITORS ARE ALLOWED IN THE SHORT STAY AREA OR RECOVERY ROOM!!**  IF YOU WILL BE ADMITTED INTO THE HOSPITAL YOU ARE ALLOWED ONLY FOUR SUPPORT PEOPLE DURING VISITATION HOURS ONLY (7 AM -8PM)   The support person(s) must pass our screening, gel in and out, and wear a mask at all times, including in the patient's room. Patients must also wear a mask when staff or their support person are in the room. Visitors GUEST BADGE MUST BE WORN VISIBLY  One adult visitor may remain with you overnight and MUST be in the room by 8 P.M.     Your procedure is scheduled on: 08/25/23   Report to [facility] Main Entrance    Report to admitting at: 5:45 AM   Call this number if you have problems the morning of surgery [phone]   Do not eat food :After Midnight.   After Midnight you may have the following liquids until: 5:00 AM DAY OF SURGERY  Water Black Coffee (sugar ok, NO MILK/CREAM OR CREAMERS)  Tea (sugar ok, NO MILK/CREAM OR CREAMERS) regular and decaf                             Plain Jell-O (NO RED)                                           Fruit ices (not with fruit pulp, NO RED)                                     Popsicles (NO RED)                                                                  Juice: apple, WHITE grape, WHITE cranberry Sports drinks like Gatorade (NO RED)   The day of surgery:  Drink ONE (1) Pre-Surgery Clear Ensure at : 5:00 AM the morning of surgery. Drink in one sitting. Do not sip.  This drink was given to you during your hospital  pre-op appointment visit. Nothing else to drink after completing the  Pre-Surgery Clear Ensure or G2.          If you have questions, please contact your surgeon's office.  FOLLOW ANY ADDITIONAL PRE OP INSTRUCTIONS YOU RECEIVED FROM YOUR SURGEON'S OFFICE!!!   Oral  Hygiene is also important to reduce your risk of infection.                                    Remember - BRUSH YOUR TEETH THE MORNING OF SURGERY WITH YOUR REGULAR TOOTHPASTE  DENTURES WILL BE REMOVED PRIOR TO SURGERY PLEASE DO NOT APPLY "Poly grip" OR ADHESIVES!!!   Do NOT smoke after Midnight   Take these medicines the morning of surgery with A  SIP OF WATER: buspirone,cetirizine,esomeprazole.Use eye drops as usual.                You may not have any metal on your body including hair pins, jewelry, and body piercing             Do not wear lotions, powders, perfumes/cologne, or deodorant              Men may shave face and neck.   Do not bring valuables to the hospital. [facility] IS NOT             RESPONSIBLE   FOR VALUABLES.   Contacts, glasses, or bridgework may not be worn into surgery.   Bring small overnight bag day of surgery.   DO NOT BRING YOUR HOME MEDICATIONS TO THE HOSPITAL. PHARMACY WILL DISPENSE MEDICATIONS LISTED ON YOUR MEDICATION LIST TO YOU DURING YOUR ADMISSION IN THE HOSPITAL!    Patients discharged on the day of surgery will not be allowed to drive home.  Someone NEEDS to stay with you for the first 24 hours after anesthesia.   Special Instructions: Bring a copy of your healthcare power of attorney and living will documents         the day of surgery if you haven't scanned them before.              Please read over the following fact sheets you were given: IF YOU HAVE QUESTIONS ABOUT YOUR PRE-OP INSTRUCTIONS PLEASE CALL [phone]      Pre-operative 5 CHG Bath Instructions   You can play a key role in reducing the risk of infection after surgery. Your skin needs to be as free of germs as possible. You can reduce the number of germs on your skin by washing with CHG (chlorhexidine gluconate) soap before surgery. CHG is an antiseptic soap that kills germs and continues to kill germs even after washing.   DO NOT use if you have an allergy to  chlorhexidine/CHG or antibacterial soaps. If your skin becomes reddened or irritated, stop using the CHG and notify one of our RNs at : [phone].   Please shower with the CHG soap starting 4 days before surgery using the following schedule:     Please keep in mind the following:  DO NOT shave, including legs and underarms, starting the day of your first shower.   You may shave your face at any point before/day of surgery.  Place clean sheets on your bed the day you start using CHG soap. Use a clean washcloth (not used since being washed) for each shower. DO NOT sleep with pets once you start using the CHG.   CHG Shower Instructions:  If you choose to wash your hair and private area, wash first with your normal shampoo/soap.  After you use shampoo/soap, rinse your hair and body thoroughly to remove shampoo/soap residue.  Turn the water OFF and apply about 3 tablespoons (45 ml) of CHG soap to a CLEAN washcloth.  Apply CHG soap ONLY FROM YOUR NECK DOWN TO YOUR TOES (washing for 3-5 minutes)  DO NOT use CHG soap on face, private areas, open wounds, or sores.  Pay special attention to the area where your surgery is being performed.  If you are having back surgery, having someone wash your back for you may be helpful. Wait 2 minutes after CHG soap is applied, then you may rinse off the CHG soap.  Pat dry with a clean towel  Put  on clean clothes/pajamas   If you choose to wear lotion, please use ONLY the CHG-compatible lotions on the back of this paper.     Additional instructions for the day of surgery: DO NOT APPLY any lotions, deodorants, cologne, or perfumes.   Put on clean/comfortable clothes.  Brush your teeth.  Ask your nurse before applying any prescription medications to the skin.    CHG Compatible Lotions   Aveeno Moisturizing lotion  Cetaphil Moisturizing Cream  Cetaphil Moisturizing Lotion  Clairol Herbal Essence Moisturizing Lotion, Dry Skin  Clairol Herbal Essence  Moisturizing Lotion, Extra Dry Skin  Clairol Herbal Essence Moisturizing Lotion, Normal Skin  Curel Age Defying Therapeutic Moisturizing Lotion with Alpha Hydroxy  Curel Extreme Care Body Lotion  Curel Soothing Hands Moisturizing Hand Lotion  Curel Therapeutic Moisturizing Cream, Fragrance-Free  Curel Therapeutic Moisturizing Lotion, Fragrance-Free  Curel Therapeutic Moisturizing Lotion, Original Formula  Eucerin Daily Replenishing Lotion  Eucerin Dry Skin Therapy Plus Alpha Hydroxy Crme  Eucerin Dry Skin Therapy Plus Alpha Hydroxy Lotion  Eucerin Original Crme  Eucerin Original Lotion  Eucerin Plus Crme Eucerin Plus Lotion  Eucerin TriLipid Replenishing Lotion  Keri Anti-Bacterial Hand Lotion  Keri Deep Conditioning Original Lotion Dry Skin Formula Softly Scented  Keri Deep Conditioning Original Lotion, Fragrance Free Sensitive Skin Formula  Keri Lotion Fast Absorbing Fragrance Free Sensitive Skin Formula  Keri Lotion Fast Absorbing Softly Scented Dry Skin Formula  Keri Original Lotion  Keri Skin Renewal Lotion Keri Silky Smooth Lotion  Keri Silky Smooth Sensitive Skin Lotion  Nivea Body Creamy Conditioning Oil  Nivea Body Extra Enriched Lotion  Nivea Body Original Lotion  Nivea Body Sheer Moisturizing Lotion Nivea Crme  Nivea Skin Firming Lotion  NutraDerm 30 Skin Lotion  NutraDerm Skin Lotion  NutraDerm Therapeutic Skin Cream  NutraDerm Therapeutic Skin Lotion  ProShield Protective Hand Cream  Provon moisturizing lotion   Incentive Spirometer  An incentive spirometer is a tool that can help keep your lungs clear and active. This tool measures how well you are filling your lungs with each breath. Taking long deep breaths may help reverse or decrease the chance of developing breathing (pulmonary) problems (especially infection) following: A long period of time when you are unable to move or be active. BEFORE THE PROCEDURE  If the spirometer includes an indicator to show  your best effort, your nurse or respiratory therapist will set it to a desired goal. If possible, sit up straight or lean slightly forward. Try not to slouch. Hold the incentive spirometer in an upright position. INSTRUCTIONS FOR USE  Sit on the edge of your bed if possible, or sit up as far as you can in bed or on a chair. Hold the incentive spirometer in an upright position. Breathe out normally. Place the mouthpiece in your mouth and seal your lips tightly around it. Breathe in slowly and as deeply as possible, raising the piston or the ball toward the top of the column. Hold your breath for 3-5 seconds or for as long as possible. Allow the piston or ball to fall to the bottom of the column. Remove the mouthpiece from your mouth and breathe out normally. Rest for a few seconds and repeat Steps 1 through 7 at least 10 times every 1-2 hours when you are awake. Take your time and take a few normal breaths between deep breaths. The spirometer may include an indicator to show your best effort. Use the indicator as a goal to work toward during each repetition. After each  set of 10 deep breaths, practice coughing to be sure your lungs are clear. If you have an incision (the cut made at the time of surgery), support your incision when coughing by placing a pillow or rolled up towels firmly against it. Once you are able to get out of bed, walk around indoors and cough well. You may stop using the incentive spirometer when instructed by your caregiver.  RISKS AND COMPLICATIONS Take your time so you do not get dizzy or light-headed. If you are in pain, you may need to take or ask for pain medication before doing incentive spirometry. It is harder to take a deep breath if you are having pain. AFTER USE Rest and breathe slowly and easily. It can be helpful to keep track of a log of your progress. Your caregiver can provide you with a simple table to help with this. If you are using the spirometer at home,  follow these instructions: SEEK MEDICAL CARE IF:  You are having difficultly using the spirometer. You have trouble using the spirometer as often as instructed. Your pain medication is not giving enough relief while using the spirometer. You develop fever of 100.5 F (38.1 C) or higher. SEEK IMMEDIATE MEDICAL CARE IF:  You cough up bloody sputum that had not been present before. You develop fever of 102 F (38.9 C) or greater. You develop worsening pain at or near the incision site. MAKE SURE YOU:  Understand these instructions. Will watch your condition. Will get help right away if you are not doing well or get worse. Document Released: 03/17/2007 Document Revised: 01/27/2012 Document Reviewed: 05/18/2007 [facility] Patient Information 2014 [place], [place].   ________________________________________________________________________

## 2023-08-14 ENCOUNTER — Other Ambulatory Visit: Payer: Self-pay

## 2023-08-14 ENCOUNTER — Encounter (HOSPITAL_COMMUNITY)
Admission: RE | Admit: 2023-08-14 | Discharge: 2023-08-14 | Disposition: A | Discharge status: Home / Self Care | Payer: Medicare Other | Source: Ambulatory Visit | Attending: Orthopedic Surgery | Admitting: Orthopedic Surgery

## 2023-08-14 ENCOUNTER — Encounter (HOSPITAL_COMMUNITY): Payer: Self-pay

## 2023-08-14 VITALS — BP 134/83 | HR 66 | Temp 98.2°F | Ht 72.0 in | Wt 185.0 lb

## 2023-08-14 DIAGNOSIS — G8929 Other chronic pain: Secondary | ICD-10-CM | POA: Diagnosis not present

## 2023-08-14 DIAGNOSIS — Z01818 Encounter for other preprocedural examination: Secondary | ICD-10-CM

## 2023-08-14 DIAGNOSIS — M25562 Pain in left knee: Secondary | ICD-10-CM | POA: Diagnosis not present

## 2023-08-14 DIAGNOSIS — Z01812 Encounter for preprocedural laboratory examination: Secondary | ICD-10-CM | POA: Insufficient documentation

## 2023-08-14 HISTORY — DX: Peripheral vascular disease, unspecified: I73.9

## 2023-08-14 HISTORY — DX: Malignant (primary) neoplasm, unspecified: C80.1

## 2023-08-14 HISTORY — DX: Cardiac murmur, unspecified: R01.1

## 2023-08-14 HISTORY — DX: Inflammatory liver disease, unspecified: K75.9

## 2023-08-14 LAB — SURGICAL PCR SCREEN
MRSA, PCR: NEGATIVE
Staphylococcus aureus: NEGATIVE

## 2023-08-14 NOTE — Progress Notes (Signed)
For Short Stay: COVID SWAB appointment date:  Bowel Prep reminder:   For Anesthesia: PCP - Plotnikov, Georgina Quint, MD LOV: 07/16/23: Clearance Cardiologist - Lewayne Bunting, MD  Clearance: 05/16/23: Robin Searing: NP Chest x-ray -  EKG - 01/07/23 Stress Test -  ECHO - 02/04/23 Cardiac Cath -  Pacemaker/ICD device last checked: Pacemaker orders received: Device Rep notified:  Spinal Cord Stimulator: N/A  Sleep Study - N/A CPAP -   Fasting Blood Sugar -  Checks Blood Sugar _____ times a day Date and result of last Hgb A1c-  Last dose of GLP1 agonist- N/A GLP1 instructions:   Last dose of SGLT-2 inhibitors- N/A SGLT-2 instructions:   Blood Thinner Instructions: Aspirin 325 mg: last dose 08/13/23.   Xarelto : will be hold after: 08/22/23 Aspirin Instructions: Last Dose:  Activity level: Can go up a flight of stairs and activities of daily living without stopping and without chest pain and/or shortness of breath   Able to exercise without chest pain and/or shortness of breath  Anesthesia review: Hx: DVT,HTN,Heart Murmur.  Patient denies shortness of breath, fever, cough and chest pain at PAT appointment   Patient verbalized understanding of instructions that were given to them at the PAT appointment. Patient was also instructed that they will need to review over the PAT instructions again at home before surgery.

## 2023-08-15 NOTE — Progress Notes (Signed)
Anesthesia Chart Review   Case: [id number] Date/Time: 08/25/23 0800   Procedure: TOTAL KNEE ARTHROPLASTY (Left: Knee)   Anesthesia type: Spinal   Pre-op diagnosis: Osteoarthritis left knee  M17.12   Location: [facility] ROOM 06 / [facility]   Surgeons: Dannielle Huh, MD       DISCUSSION:77 y.o. former smoker with h/o HTN, congenital VSD, PVD, DVT, BPH, left knee OA scheduled for above procedure 08/25/2023 with Dr. Dannielle Huh.   Per cardiology note 05/16/2023, "Patient is RCRI score is 0.9%   The patient affirms he has been doing well without any new cardiac symptoms. They are able to achieve 6 METS without cardiac limitations. Therefore, based on ACC/AHA guidelines, the patient would be at acceptable risk for the planned procedure without further cardiovascular testing. The patient was advised that if he develops new symptoms prior to surgery to contact our office to arrange for a follow-up visit, and he verbalized understanding.    The patient was advised that if he develops new symptoms prior to surgery to contact our office to arrange for a follow-up visit, and he verbalized understanding.   Guidance for holding ASA 325 should come from prescribing provider.  Patient is aware per our conversation today."  VS: BP 134/83   Pulse 66   Temp 36.8 C (Oral)   Ht 6' (1.829 m)   Wt 83.9 kg   SpO2 95%   BMI 25.09 kg/m   PROVIDERS: Plotnikov, Georgina Quint, MD is PCP   Cardiologist - Jens Som, Madolyn Frieze, MD  LABS: {CHL AN LABS REVIEWED:112001::"Labs reviewed: Acceptable for surgery."} (all labs ordered are listed, but only abnormal results are displayed)  Labs Reviewed  SURGICAL PCR SCREEN     IMAGES: VAS [place] AAA 12/09/22 Summary:  Abdominal Aorta: The largest aortic measurement is 3.0 cm in the proximal  segment with distal tapering. There is evidence of abnormal dilation of  the right common iliac artery, left common iliac artery and left external  iliac artery. The left common  iliac artery  dimension appears increased from prior exam. The largest  aortic diameter remains essentially unchanged compared to prior exam.  Previous diameter measurement was 3.0 cm obtained on 12/12/20.  Stenosis:  No evidence of stenosis seen throughout the aorta and bilateral iliac  arteries.   EKG:   CV: Echo 02/04/2023  1. Small restrictive perimembranous VSD; V max 5.4 m/s, with L-R shunt  (no significant change from prior study). Left ventricular ejection  fraction, by estimation, is 60 to 65%. The left ventricle has normal  function. The left ventricle has no regional  wall motion abnormalities. There is mild left ventricular hypertrophy.  Left ventricular diastolic parameters are consistent with Grade I  diastolic dysfunction (impaired relaxation).   2. Right ventricular systolic function is normal. The right ventricular  size is normal. There is mildly elevated pulmonary artery systolic  pressure.   3. No evidence of mitral valve regurgitation.   4. Aortic valve regurgitation is mild. Aortic valve  sclerosis/calcification is present, without any evidence of aortic  stenosis.   5. The inferior vena cava is normal in size with greater than 50%  respiratory variability, suggesting right atrial pressure of 3 mmHg.   Myocardial Perfusion 05/13/2019 The left ventricular ejection fraction is mildly decreased (45-54%). Nuclear stress EF: 51%. No T wave inversion was noted during stress. There was no ST segment deviation noted during stress. Defect 1: There is a small defect of mild severity present in the apex location. This  is a low risk study.   Small size, mild intensity fixed apical perfusion defect. Likely artifact. No reversible ischemia. LVEF 51% with normal wall motion. This is a low risk study. Past Medical History:  Diagnosis Date   Acute hepatitis C without mention of hepatic coma(070.51)    Allergic rhinitis, cause unspecified    Anxiety state, unspecified    Arthritis     Barrett's esophagus    BPH (benign prostatic hyperplasia)    Cancer (HCC)    skin: face,scalp   DVT (deep venous thrombosis) (HCC)    Elevated PSA    Dr Vernie Ammons   Esophageal reflux    Heart murmur    Hepatitis C    Hiatal hernia    Hyperlipidemia    Hypertension    Hypertonicity of bladder    Insomnia    Iron deficiency anemia 04/30/2022   Iron malabsorption 04/30/2022   Leg DVT (deep venous thromboembolism), chronic, unspecified laterality (HCC) 09/21/2018   Osteoporosis, unspecified    Peripheral vascular disease (HCC)    Personal history of urinary calculi    Recent retinal detachment, partial, with giant tear    Unspecified tinnitus    Ventricular septal defect     Past Surgical History:  Procedure Laterality Date   COLONOSCOPY     KNEE SURGERY Bilateral    RETINAL DETACHMENT SURGERY     TOE SURGERY Left    great toe   UPPER GI ENDOSCOPY      MEDICATIONS:  Apoaequorin (PREVAGEN PO)   aspirin 325 MG tablet   Bromfenac Sodium 0.07 % SOLN   busPIRone (BUSPAR) 7.5 MG tablet   cetirizine (ZYRTEC) 10 MG tablet   Coenzyme Q10 (CO Q 10 PO)   COLLAGEN PO   Dextromethorphan-guaiFENesin (MUCINEX DM MAXIMUM STRENGTH) 60-1200 MG TB12   EPINEPHrine (EPIPEN 2-PAK) 0.3 mg/0.3 mL IJ SOAJ injection   esomeprazole (NEXIUM) 20 MG capsule   Evolocumab (REPATHA SURECLICK) 140 MG/ML SOAJ   ferrous sulfate 325 (65 FE) MG tablet   Glucosamine HCl (GLUCOSAMINE PO)   MAGNESIUM GLUCONATE PO   olmesartan (BENICAR) 40 MG tablet   OVER THE COUNTER MEDICATION   OVER THE COUNTER MEDICATION   OVER THE COUNTER MEDICATION   OVER THE COUNTER MEDICATION   OVER THE COUNTER MEDICATION   OVER THE COUNTER MEDICATION   OVER THE COUNTER MEDICATION   Probiotic Product (PROBIOTIC PO)   rivaroxaban (XARELTO) 10 MG TABS tablet   simethicone (MYLICON) 125 MG chewable tablet   Sodium Hyaluronate, oral, (HYALURONIC ACID PO)   No current facility-administered medications for this encounter.

## 2023-08-18 NOTE — Anesthesia Preprocedure Evaluation (Addendum)
Anesthesia Evaluation  Patient identified by MRN, date of birth, ID band Patient awake    Reviewed: Allergy & Precautions, NPO status , Patient's Chart, lab work & pertinent test results  Airway Mallampati: III  TM Distance: >3 FB Neck ROM: Full    Dental  (+) Dental Advisory Given, Teeth Intact   Pulmonary former smoker   Pulmonary exam normal breath sounds clear to auscultation       Cardiovascular hypertension (136/80 preop), Pt. on medications + Peripheral Vascular Disease and + DVT (2019)  Normal cardiovascular exam Rhythm:Regular Rate:Normal     Neuro/Psych  PSYCHIATRIC DISORDERS Anxiety     negative neurological ROS     GI/Hepatic hiatal hernia,GERD  Medicated and Controlled,,(+) Hepatitis -, C  Endo/Other  negative endocrine ROS    Renal/GU negative Renal ROS  negative genitourinary   Musculoskeletal  (+) Arthritis , Osteoarthritis,    Abdominal   Peds  Hematology negative hematology ROS (+) Hb 14.7, plt 255   Anesthesia Other Findings Xarelto LD: 10/4  Reproductive/Obstetrics negative OB ROS                             Anesthesia Physical Anesthesia Plan  ASA: 3  Anesthesia Plan: Spinal, Regional and MAC   Post-op Pain Management: Regional block* and Tylenol PO (pre-op)*   Induction:   PONV Risk Score and Plan: 2 and Propofol infusion and TIVA  Airway Management Planned: Natural Airway and Nasal Cannula  Additional Equipment: None  Intra-op Plan:   Post-operative Plan:   Informed Consent: I have reviewed the patients History and Physical, chart, labs and discussed the procedure including the risks, benefits and alternatives for the proposed anesthesia with the patient or authorized representative who has indicated his/her understanding and acceptance.       Plan Discussed with: CRNA  Anesthesia Plan Comments:        Anesthesia Quick Evaluation

## 2023-08-19 ENCOUNTER — Ambulatory Visit: Payer: Medicare Other | Admitting: Hematology & Oncology

## 2023-08-19 ENCOUNTER — Other Ambulatory Visit: Payer: Medicare Other

## 2023-08-20 DIAGNOSIS — G8929 Other chronic pain: Secondary | ICD-10-CM | POA: Diagnosis not present

## 2023-08-20 DIAGNOSIS — M25562 Pain in left knee: Secondary | ICD-10-CM | POA: Diagnosis not present

## 2023-08-20 DIAGNOSIS — M1712 Unilateral primary osteoarthritis, left knee: Secondary | ICD-10-CM | POA: Diagnosis not present

## 2023-08-25 ENCOUNTER — Ambulatory Visit (HOSPITAL_BASED_OUTPATIENT_CLINIC_OR_DEPARTMENT_OTHER): Payer: Medicare Other | Admitting: Anesthesiology

## 2023-08-25 ENCOUNTER — Observation Stay (HOSPITAL_COMMUNITY)
Admission: RE | Admit: 2023-08-25 | Discharge: 2023-08-26 | Disposition: A | Discharge status: Home / Self Care | Payer: Medicare Other | Source: Ambulatory Visit | Attending: Orthopedic Surgery | Admitting: Orthopedic Surgery

## 2023-08-25 ENCOUNTER — Encounter (HOSPITAL_COMMUNITY): Payer: Self-pay | Admitting: Orthopedic Surgery

## 2023-08-25 ENCOUNTER — Encounter (HOSPITAL_COMMUNITY)
Admission: RE | Disposition: A | Discharge status: Home / Self Care | Payer: Self-pay | Source: Ambulatory Visit | Attending: Orthopedic Surgery

## 2023-08-25 ENCOUNTER — Other Ambulatory Visit: Payer: Self-pay

## 2023-08-25 ENCOUNTER — Ambulatory Visit (HOSPITAL_COMMUNITY): Payer: Medicare Other | Admitting: Physician Assistant

## 2023-08-25 DIAGNOSIS — F419 Anxiety disorder, unspecified: Secondary | ICD-10-CM

## 2023-08-25 DIAGNOSIS — Z85828 Personal history of other malignant neoplasm of skin: Secondary | ICD-10-CM | POA: Insufficient documentation

## 2023-08-25 DIAGNOSIS — G8918 Other acute postprocedural pain: Secondary | ICD-10-CM | POA: Diagnosis not present

## 2023-08-25 DIAGNOSIS — Z7982 Long term (current) use of aspirin: Secondary | ICD-10-CM | POA: Diagnosis not present

## 2023-08-25 DIAGNOSIS — Z96659 Presence of unspecified artificial knee joint: Principal | ICD-10-CM

## 2023-08-25 DIAGNOSIS — I10 Essential (primary) hypertension: Secondary | ICD-10-CM | POA: Diagnosis not present

## 2023-08-25 DIAGNOSIS — Z79899 Other long term (current) drug therapy: Secondary | ICD-10-CM | POA: Insufficient documentation

## 2023-08-25 DIAGNOSIS — M1712 Unilateral primary osteoarthritis, left knee: Principal | ICD-10-CM | POA: Insufficient documentation

## 2023-08-25 DIAGNOSIS — Z7901 Long term (current) use of anticoagulants: Secondary | ICD-10-CM | POA: Insufficient documentation

## 2023-08-25 DIAGNOSIS — Z87891 Personal history of nicotine dependence: Secondary | ICD-10-CM | POA: Insufficient documentation

## 2023-08-25 DIAGNOSIS — Z86718 Personal history of other venous thrombosis and embolism: Secondary | ICD-10-CM | POA: Insufficient documentation

## 2023-08-25 HISTORY — PX: TOTAL KNEE ARTHROPLASTY: SHX125

## 2023-08-25 SURGERY — ARTHROPLASTY, KNEE, TOTAL
Anesthesia: Monitor Anesthesia Care | Site: Knee | Laterality: Left

## 2023-08-25 MED ORDER — ALBUMIN HUMAN 5 % IV SOLN
INTRAVENOUS | Status: AC
  Filled 2023-08-25: qty 250

## 2023-08-25 MED ORDER — SODIUM CHLORIDE 0.9% FLUSH
INTRAVENOUS | Status: DC | PRN
  Administered 2023-08-25: 20 mL

## 2023-08-25 MED ORDER — PROPOFOL 10 MG/ML IV BOLUS
INTRAVENOUS | Status: AC
  Filled 2023-08-25: qty 20

## 2023-08-25 MED ORDER — ALBUMIN HUMAN 5 % IV SOLN
INTRAVENOUS | Status: DC | PRN
Start: 2023-08-25 — End: 2023-08-25

## 2023-08-25 MED ORDER — MIDAZOLAM HCL 2 MG/2ML IJ SOLN: 1.0000 mg | INTRAMUSCULAR | Status: DC

## 2023-08-25 MED ORDER — DEXAMETHASONE SODIUM PHOSPHATE 10 MG/ML IJ SOLN
INTRAMUSCULAR | Status: AC
  Filled 2023-08-25: qty 1

## 2023-08-25 MED ORDER — FENTANYL CITRATE PF 50 MCG/ML IJ SOSY
50.0000 ug | PREFILLED_SYRINGE | INTRAMUSCULAR | Status: AC
  Administered 2023-08-25: 100 ug via INTRAVENOUS
  Filled 2023-08-25: qty 2

## 2023-08-25 MED ORDER — ACETAMINOPHEN 500 MG PO TABS
1000.0000 mg | ORAL_TABLET | Freq: Once | ORAL | Status: AC
  Administered 2023-08-25: 1000 mg via ORAL
  Filled 2023-08-25: qty 2

## 2023-08-25 MED ORDER — BISACODYL 5 MG PO TBEC: 5.0000 mg | DELAYED_RELEASE_TABLET | Freq: Every day | ORAL | Status: DC | PRN

## 2023-08-25 MED ORDER — METOCLOPRAMIDE HCL 5 MG PO TABS: 5.0000 mg | ORAL_TABLET | Freq: Three times a day (TID) | ORAL | Status: DC | PRN

## 2023-08-25 MED ORDER — BUPIVACAINE IN DEXTROSE 0.75-8.25 % IT SOLN
INTRATHECAL | Status: DC | PRN
Start: 2023-08-25 — End: 2023-08-25
  Administered 2023-08-25: 1.8 mL via INTRATHECAL

## 2023-08-25 MED ORDER — FLEET ENEMA RE ENEM: 1.0000 | ENEMA | Freq: Once | RECTAL | Status: DC | PRN

## 2023-08-25 MED ORDER — LIDOCAINE HCL (CARDIAC) PF 100 MG/5ML IV SOSY
PREFILLED_SYRINGE | INTRAVENOUS | Status: DC | PRN
Start: 2023-08-25 — End: 2023-08-25
  Administered 2023-08-25: 20 mg via INTRATRACHEAL

## 2023-08-25 MED ORDER — POVIDONE-IODINE 10 % EX SWAB: 2.0000 | Freq: Once | CUTANEOUS | Status: DC

## 2023-08-25 MED ORDER — WATER FOR IRRIGATION, STERILE IR SOLN
Status: DC | PRN
  Administered 2023-08-25: 2000 mL

## 2023-08-25 MED ORDER — BUPIVACAINE LIPOSOME 1.3 % IJ SUSP
INTRAMUSCULAR | Status: AC
  Filled 2023-08-25: qty 20

## 2023-08-25 MED ORDER — DOCUSATE SODIUM 100 MG PO CAPS
100.0000 mg | ORAL_CAPSULE | Freq: Two times a day (BID) | ORAL | Status: DC
  Administered 2023-08-25 – 2023-08-26 (×2): 100 mg via ORAL
  Filled 2023-08-25 (×2): qty 1

## 2023-08-25 MED ORDER — METOCLOPRAMIDE HCL 5 MG/ML IJ SOLN: 5.0000 mg | Freq: Three times a day (TID) | INTRAMUSCULAR | Status: DC | PRN

## 2023-08-25 MED ORDER — FERROUS SULFATE 325 (65 FE) MG PO TABS
325.0000 mg | ORAL_TABLET | Freq: Three times a day (TID) | ORAL | Status: DC
  Administered 2023-08-25 – 2023-08-26 (×3): 325 mg via ORAL
  Filled 2023-08-25 (×3): qty 1

## 2023-08-25 MED ORDER — PROPOFOL 500 MG/50ML IV EMUL
INTRAVENOUS | Status: DC | PRN
  Administered 2023-08-25: 20 mg via INTRAVENOUS
  Administered 2023-08-25: 75 ug/kg/min via INTRAVENOUS
  Administered 2023-08-25 (×2): 20 mg via INTRAVENOUS

## 2023-08-25 MED ORDER — CHLORHEXIDINE GLUCONATE 0.12 % MT SOLN
15.0000 mL | Freq: Once | OROMUCOSAL | Status: AC
  Administered 2023-08-25: 15 mL via OROMUCOSAL

## 2023-08-25 MED ORDER — ZOLPIDEM TARTRATE 5 MG PO TABS
5.0000 mg | ORAL_TABLET | Freq: Every evening | ORAL | Status: DC | PRN
  Administered 2023-08-25: 5 mg via ORAL
  Filled 2023-08-25: qty 1

## 2023-08-25 MED ORDER — GABAPENTIN 300 MG PO CAPS
300.0000 mg | ORAL_CAPSULE | Freq: Once | ORAL | Status: AC
  Administered 2023-08-25: 300 mg via ORAL
  Filled 2023-08-25: qty 1

## 2023-08-25 MED ORDER — HYDROMORPHONE HCL 1 MG/ML IJ SOLN
0.5000 mg | INTRAMUSCULAR | Status: DC | PRN
  Administered 2023-08-26: 0.5 mg via INTRAVENOUS
  Filled 2023-08-25: qty 1

## 2023-08-25 MED ORDER — DEXAMETHASONE SODIUM PHOSPHATE 10 MG/ML IJ SOLN
10.0000 mg | Freq: Once | INTRAMUSCULAR | Status: AC
  Administered 2023-08-26: 10 mg via INTRAVENOUS
  Filled 2023-08-25: qty 1

## 2023-08-25 MED ORDER — METHOCARBAMOL 1000 MG/10ML IJ SOLN: 500.0000 mg | Freq: Four times a day (QID) | INTRAVENOUS | Status: DC | PRN

## 2023-08-25 MED ORDER — PHENOL 1.4 % MT LIQD: 1.0000 | OROMUCOSAL | Status: DC | PRN

## 2023-08-25 MED ORDER — BUSPIRONE HCL 5 MG PO TABS
7.5000 mg | ORAL_TABLET | Freq: Two times a day (BID) | ORAL | Status: DC
  Administered 2023-08-25 – 2023-08-26 (×2): 7.5 mg via ORAL
  Filled 2023-08-25 (×2): qty 2

## 2023-08-25 MED ORDER — PANTOPRAZOLE SODIUM 40 MG PO TBEC
40.0000 mg | DELAYED_RELEASE_TABLET | Freq: Every day | ORAL | Status: DC
  Administered 2023-08-26: 40 mg via ORAL
  Filled 2023-08-25: qty 1

## 2023-08-25 MED ORDER — ONDANSETRON HCL 4 MG/2ML IJ SOLN
INTRAMUSCULAR | Status: DC | PRN
  Administered 2023-08-25: 4 mg via INTRAVENOUS

## 2023-08-25 MED ORDER — BUPIVACAINE-EPINEPHRINE 0.25% -1:200000 IJ SOLN
INTRAMUSCULAR | Status: DC | PRN
  Administered 2023-08-25: 30 mL

## 2023-08-25 MED ORDER — ALUM & MAG HYDROXIDE-SIMETH 200-200-20 MG/5ML PO SUSP: 30.0000 mL | ORAL | Status: DC | PRN

## 2023-08-25 MED ORDER — METHOCARBAMOL 500 MG PO TABS
500.0000 mg | ORAL_TABLET | Freq: Four times a day (QID) | ORAL | Status: DC | PRN
  Administered 2023-08-25 – 2023-08-26 (×4): 500 mg via ORAL
  Filled 2023-08-25 (×4): qty 1

## 2023-08-25 MED ORDER — BUPIVACAINE-EPINEPHRINE 0.25% -1:200000 IJ SOLN
INTRAMUSCULAR | Status: AC
  Filled 2023-08-25: qty 1

## 2023-08-25 MED ORDER — PHENYLEPHRINE HCL-NACL 20-0.9 MG/250ML-% IV SOLN
INTRAVENOUS | Status: DC | PRN
Start: 2023-08-25 — End: 2023-08-25
  Administered 2023-08-25: 50 ug/min via INTRAVENOUS

## 2023-08-25 MED ORDER — DEXAMETHASONE SODIUM PHOSPHATE 10 MG/ML IJ SOLN: 8.0000 mg | Freq: Once | INTRAMUSCULAR | Status: DC

## 2023-08-25 MED ORDER — ACETAMINOPHEN 500 MG PO TABS
1000.0000 mg | ORAL_TABLET | Freq: Four times a day (QID) | ORAL | Status: DC
  Administered 2023-08-25 – 2023-08-26 (×3): 1000 mg via ORAL
  Filled 2023-08-25 (×3): qty 2

## 2023-08-25 MED ORDER — SODIUM CHLORIDE (PF) 0.9 % IJ SOLN
INTRAMUSCULAR | Status: AC
  Filled 2023-08-25: qty 50

## 2023-08-25 MED ORDER — IRBESARTAN 150 MG PO TABS
300.0000 mg | ORAL_TABLET | Freq: Every day | ORAL | Status: DC
  Filled 2023-08-25: qty 2

## 2023-08-25 MED ORDER — SENNOSIDES-DOCUSATE SODIUM 8.6-50 MG PO TABS: 1.0000 | ORAL_TABLET | Freq: Every evening | ORAL | Status: DC | PRN

## 2023-08-25 MED ORDER — DEXAMETHASONE SODIUM PHOSPHATE 10 MG/ML IJ SOLN
INTRAMUSCULAR | Status: DC | PRN
Start: 2023-08-25 — End: 2023-08-25
  Administered 2023-08-25: 10 mg

## 2023-08-25 MED ORDER — BUPIVACAINE LIPOSOME 1.3 % IJ SUSP
INTRAMUSCULAR | Status: DC | PRN
  Administered 2023-08-25: 20 mL

## 2023-08-25 MED ORDER — SODIUM CHLORIDE 0.9 % IR SOLN
Status: DC | PRN
  Administered 2023-08-25: 1000 mL

## 2023-08-25 MED ORDER — BUPIVACAINE LIPOSOME 1.3 % IJ SUSP: 20.0000 mL | Freq: Once | INTRAMUSCULAR | Status: DC

## 2023-08-25 MED ORDER — ORAL CARE MOUTH RINSE: 15.0000 mL | Freq: Once | OROMUCOSAL | Status: AC

## 2023-08-25 MED ORDER — ROPIVACAINE HCL 5 MG/ML IJ SOLN
INTRAMUSCULAR | Status: DC | PRN
Start: 2023-08-25 — End: 2023-08-25
  Administered 2023-08-25: 30 mL via PERINEURAL

## 2023-08-25 MED ORDER — PANTOPRAZOLE SODIUM 40 MG PO TBEC: 40.0000 mg | DELAYED_RELEASE_TABLET | Freq: Every day | ORAL | Status: DC

## 2023-08-25 MED ORDER — RIVAROXABAN 10 MG PO TABS
10.0000 mg | ORAL_TABLET | Freq: Every day | ORAL | Status: DC
  Administered 2023-08-26: 10 mg via ORAL
  Filled 2023-08-25: qty 1

## 2023-08-25 MED ORDER — CEFAZOLIN SODIUM-DEXTROSE 2-4 GM/100ML-% IV SOLN
2.0000 g | INTRAVENOUS | Status: AC
  Administered 2023-08-25: 2 g via INTRAVENOUS
  Filled 2023-08-25: qty 100

## 2023-08-25 MED ORDER — ONDANSETRON HCL 4 MG/2ML IJ SOLN: 4.0000 mg | Freq: Four times a day (QID) | INTRAMUSCULAR | Status: DC | PRN

## 2023-08-25 MED ORDER — PROPOFOL 1000 MG/100ML IV EMUL
INTRAVENOUS | Status: AC
  Filled 2023-08-25: qty 100

## 2023-08-25 MED ORDER — TRANEXAMIC ACID-NACL 1000-0.7 MG/100ML-% IV SOLN
1000.0000 mg | INTRAVENOUS | Status: AC
  Administered 2023-08-25: 1000 mg via INTRAVENOUS
  Filled 2023-08-25: qty 100

## 2023-08-25 MED ORDER — ACETAMINOPHEN 500 MG PO TABS: 1000.0000 mg | ORAL_TABLET | Freq: Once | ORAL | Status: DC

## 2023-08-25 MED ORDER — DIPHENHYDRAMINE HCL 12.5 MG/5ML PO ELIX: 12.5000 mg | ORAL_SOLUTION | ORAL | Status: DC | PRN

## 2023-08-25 MED ORDER — SODIUM CHLORIDE 0.9 % IV SOLN: INTRAVENOUS | Status: DC

## 2023-08-25 MED ORDER — OXYCODONE HCL 5 MG PO TABS
5.0000 mg | ORAL_TABLET | ORAL | Status: DC | PRN
  Administered 2023-08-25 (×2): 5 mg via ORAL
  Administered 2023-08-26: 10 mg via ORAL
  Administered 2023-08-26: 5 mg via ORAL
  Filled 2023-08-25 (×3): qty 1
  Filled 2023-08-25: qty 2

## 2023-08-25 MED ORDER — LIDOCAINE HCL (PF) 2 % IJ SOLN
INTRAMUSCULAR | Status: AC
  Filled 2023-08-25: qty 5

## 2023-08-25 MED ORDER — LACTATED RINGERS IV SOLN: INTRAVENOUS | Status: DC

## 2023-08-25 MED ORDER — MENTHOL 3 MG MT LOZG: 1.0000 | LOZENGE | OROMUCOSAL | Status: DC | PRN

## 2023-08-25 MED ORDER — ONDANSETRON HCL 4 MG PO TABS: 4.0000 mg | ORAL_TABLET | Freq: Four times a day (QID) | ORAL | Status: DC | PRN

## 2023-08-25 SURGICAL SUPPLY — 57 items
BAG COUNTER SPONGE SURGICOUNT (BAG) IMPLANT
BAG SPEC THK2 15X12 ZIP CLS (MISCELLANEOUS) ×1
BAG SPNG CNTER NS LX DISP (BAG)
BAG ZIPLOCK 12X15 (MISCELLANEOUS) ×2 IMPLANT
BLADE SAGITTAL 13X1.27X60 (BLADE) ×2 IMPLANT
BLADE SAW SGTL 18X1.27X75 (BLADE) ×2 IMPLANT
BLADE SURG 15 STRL LF DISP TIS (BLADE) ×2 IMPLANT
BLADE SURG 15 STRL SS (BLADE) ×1
BNDG CMPR 6 X 5 YARDS HK CLSR (GAUZE/BANDAGES/DRESSINGS) ×1
BNDG CMPR MED 15X6 ELC VLCR LF (GAUZE/BANDAGES/DRESSINGS) ×1
BNDG ELASTIC 6INX 5YD STR LF (GAUZE/BANDAGES/DRESSINGS) ×2 IMPLANT
BNDG ELASTIC 6X15 VLCR STRL LF (GAUZE/BANDAGES/DRESSINGS) IMPLANT
BOWL SMART MIX CTS (DISPOSABLE) ×2 IMPLANT
BSPLAT TIB 5D F CMNT KN LT (Knees) ×1 IMPLANT
CEMENT BONE R 1X40 (Cement) ×4 IMPLANT
COVER SURGICAL LIGHT HANDLE (MISCELLANEOUS) ×2 IMPLANT
CUFF TOURN SGL QUICK 34 (TOURNIQUET CUFF) ×1
CUFF TRNQT CYL 34X4.125X (TOURNIQUET CUFF) ×2 IMPLANT
DRAPE INCISE IOBAN 66X45 STRL (DRAPES) ×4 IMPLANT
DRAPE U-SHAPE 47X51 STRL (DRAPES) ×2 IMPLANT
DRSG AQUACEL AG ADV 3.5X10 (GAUZE/BANDAGES/DRESSINGS) ×2 IMPLANT
DURAPREP 26ML APPLICATOR (WOUND CARE) ×4 IMPLANT
ELECT REM PT RETURN 15FT ADLT (MISCELLANEOUS) ×2 IMPLANT
FEMUR CMT CCR STD SZ11 L KNEE (Knees) ×1 IMPLANT
FEMUR CMTD CCR STD SZ11 L KNEE (Knees) IMPLANT
GLOVE BIOGEL M 7.0 STRL (GLOVE) IMPLANT
GLOVE BIOGEL PI IND STRL 7.5 (GLOVE) IMPLANT
GLOVE BIOGEL PI IND STRL 8.5 (GLOVE) ×2 IMPLANT
GLOVE SURG ORTHO 8.0 STRL STRW (GLOVE) ×4 IMPLANT
GOWN STRL REUS W/ TWL XL LVL3 (GOWN DISPOSABLE) ×4 IMPLANT
GOWN STRL REUS W/TWL XL LVL3 (GOWN DISPOSABLE) ×2
HANDPIECE INTERPULSE COAX TIP (DISPOSABLE) ×1
HOLDER FOLEY CATH W/STRAP (MISCELLANEOUS) ×2 IMPLANT
HOOD PEEL AWAY T7 (MISCELLANEOUS) ×6 IMPLANT
INSERT TIB BEARING ASF 14 LT (Insert) IMPLANT
KIT TURNOVER KIT A (KITS) IMPLANT
MANIFOLD NEPTUNE II (INSTRUMENTS) ×2 IMPLANT
NS IRRIG 1000ML POUR BTL (IV SOLUTION) ×2 IMPLANT
PACK ICE MAXI GEL EZY WRAP (MISCELLANEOUS) IMPLANT
PACK TOTAL KNEE CUSTOM (KITS) ×2 IMPLANT
PROTECTOR NERVE ULNAR (MISCELLANEOUS) ×2 IMPLANT
SET HNDPC FAN SPRY TIP SCT (DISPOSABLE) ×2 IMPLANT
SPIKE FLUID TRANSFER (MISCELLANEOUS) ×4 IMPLANT
STEM POLY PAT PLY 38M KNEE (Knees) IMPLANT
STEM TIBIA 5 DEG SZ F L KNEE (Knees) IMPLANT
STRIP CLOSURE SKIN 1/2X4 (GAUZE/BANDAGES/DRESSINGS) ×2 IMPLANT
SUT BONE WAX W31G (SUTURE) ×2 IMPLANT
SUT MNCRL AB 3-0 PS2 18 (SUTURE) ×2 IMPLANT
SUT STRATAFIX 0 PDS 27 VIOLET (SUTURE) ×1
SUT STRATAFIX 1PDS 45CM VIOLET (SUTURE) ×2 IMPLANT
SUT VIC AB 1 CT1 36 (SUTURE) ×2 IMPLANT
SUTURE STRATFX 0 PDS 27 VIOLET (SUTURE) ×2 IMPLANT
TIBIA STEM 5 DEG SZ F L KNEE (Knees) ×1 IMPLANT
TRAY FOLEY MTR SLVR 16FR STAT (SET/KITS/TRAYS/PACK) ×2 IMPLANT
TUBE SUCTION HIGH CAP CLEAR NV (SUCTIONS) ×2 IMPLANT
WATER STERILE IRR 1000ML POUR (IV SOLUTION) ×4 IMPLANT
WRAP KNEE MAXI GEL POST OP (GAUZE/BANDAGES/DRESSINGS) ×4 IMPLANT

## 2023-08-25 NOTE — Progress Notes (Signed)
Orthopedic Tech Progress Note Patient Details:  Roger Kent [facility] 06/05/1946 [id number]  CPM Left Knee CPM Left Knee: On Left Knee Flexion (Degrees): 90 Left Knee Extension (Degrees): 0  Post Interventions Patient Tolerated: Well Instructions Provided: Care of device Ortho Devices Type of Ortho Device: Bone foam zero knee Ortho Device/Splint Interventions: Ordered   Post Interventions Patient Tolerated: Well Instructions Provided: Care of device  [name] 08/25/2023, 10:43 AM

## 2023-08-25 NOTE — Anesthesia Procedure Notes (Signed)
Spinal  Patient location during procedure: OR Start time: 08/25/2023 8:22 AM End time: 08/25/2023 8:26 AM Reason for block: surgical anesthesia Staffing Performed: resident/CRNA  Resident/CRNA: Florene Route, CRNA Performed by: Florene Route, CRNA Authorized by: Lannie Fields, DO   Preanesthetic Checklist Completed: patient identified, IV checked, site marked, risks and benefits discussed, surgical consent, monitors and equipment checked, pre-op evaluation and timeout performed Spinal Block Patient position: sitting Prep: DuraPrep and site prepped and draped Patient monitoring: heart rate, cardiac monitor, continuous pulse ox and blood pressure Approach: midline Location: L3-4 Injection technique: single-shot Needle Needle type: Pencan  Needle gauge: 24 G Needle length: 10 cm Assessment Sensory level: T4 Events: CSF return Additional Notes Kit expiration date 04/17/2025 and lot # [id number] Clear free flow CSF, negative heme, negative paresthesia Tolerated well and returned to supine position

## 2023-08-25 NOTE — Op Note (Signed)
TOTAL KNEE REPLACEMENT OPERATIVE NOTE:  08/25/2023  11:30 AM  PATIENT:  Roger Kent  77 y.o. male  PRE-OPERATIVE DIAGNOSIS:  Osteoarthritis left knee  M17.12  POST-OPERATIVE DIAGNOSIS:  Osteoarthritis left knee M17.12  PROCEDURE:  Procedure(s): TOTAL KNEE ARTHROPLASTY  SURGEON:  Surgeon(s): Dannielle Huh, MD  PHYSICIAN ASSISTANT: Skip Mayer, PA-C  ANESTHESIA:   spinal  SPECIMEN: None  COUNTS:  Correct  TOURNIQUET:   Total Tourniquet Time Documented: Thigh (Left) - 47 minutes Total: Thigh (Left) - 47 minutes   DICTATION:  Indication for procedure:    The patient is a 77 y.o. male who has failed conservative treatment for Osteoarthritis left knee  M17.12.  Informed consent was obtained prior to anesthesia. The risks versus benefits of the operation were explain and in a way the patient can, and did, understand.     Description of procedure:     The patient was taken to the operating room and placed under anesthesia.  The patient was positioned in the usual fashion taking care that all body parts were adequately padded and/or protected.  A tourniquet was applied and the leg prepped and draped in the usual sterile fashion.  The extremity was exsanguinated with the esmarch and tourniquet inflated to 300 mmHg.  Pre-operative range of motion was normal.   A midline incision approximately 6-7 inches long was made with a #10 blade.  A new blade was used to make a parapatellar arthrotomy going 2-3 cm into the quadriceps tendon, over the patella, and alongside the medial aspect of the patellar tendon.  A synovectomy was then performed with the #10 blade and forceps. I then elevated the deep MCL off the medial tibial metaphysis subperiosteally around to the semimembranosus attachment.    I everted the patella and used calipers to measure patellar thickness.  I used the reamer to ream down to appropriate thickness to recreate the native thickness.  I then removed excess  bone with the rongeur and sagittal saw.  I used the appropriately sized template and drilled the three lug holes.  I then put the trial in place and measured the thickness with the calipers to ensure recreation of the native thickness.  The trial was then removed and the patella subluxed and the knee brought into flexion.  A homan retractor was place to retract and protect the patella and lateral structures.  A Z-retractor was place medially to protect the medial structures.  The extra-medullary alignment system was used to make cut the tibial articular surface perpendicular to the anamotic axis of the tibia and in 3 degrees of posterior slope.  The cut surface and alignment jig was removed.  I then used the intramedullary alignment guide to make a 5 valgus cut on the distal femur.  I then marked out the epicondylar axis on the distal femur.  I then used the anterior referencing sizer and measured the femur to be a size  11 .  The 4-In-1 cutting block was screwed into place in external rotation matching the posterior condylar angle, making our cuts perpendicular to the epicondylar axis.  Anterior, posterior and chamfer cuts were made with the sagittal saw.  The cutting block and cut pieces were removed.  A lamina spreader was placed in 90 degrees of flexion.  The ACL, PCL, menisci, and posterior condylar osteophytes were removed.  A 14 mm spacer blocked was found to offer good flexion and extension gap balance after minimal in degree releasing.   The scoop retractor was then  placed and the femoral finishing block was pinned in place.  The small sagittal saw was used as well as the lug drill to finish the femur.  The block and cut surfaces were removed and the medullary canal hole filled with autograft bone from the cut pieces.  The tibia was delivered forward in deep flexion and external rotation.  A size F tray was selected and pinned into place centered on the medial 1/3 of the tibial tubercle.  The reamer  and keel was used to prepare the tibia through the tray.    I then trialed with the size  11  femur, size F tibia, a 14 mm insert and the 38 patella.  I had excellent flexion/extension gap balance, excellent patella tracking.  Flexion was full and beyond 120 degrees; extension was zero.  These components were chosen and the staff opened them to me on the back table while the knee was lavaged copiously and the cement mixed.  The soft tissue was infiltrated with 60cc of exparel 1.3% through a 21 gauge needle.  I cemented in the components and removed all excess cement.  The polyethylene tibial component was snapped into place and the knee placed in extension while cement was hardening.  The capsule was infilltrated with a 60cc exparel/marcaine/saline mixture.   Once the cement was hard, the tourniquet was let down.  Hemostasis was obtained.  The arthrotomy was closed using a #1 stratofix running suture.  The deep soft tissues were closed with #0 vicryls and the subcuticular layer closed with #2-0 vicryl.  The skin was reapproximated and closed with 3.0 Monocryl.  The wound was covered with steristrips, aquacel dressing, and a TED stocking.   The patient was then awakened, extubated, and taken to the recovery room in stable condition.  BLOOD LOSS:  300cc COMPLICATIONS:  None.  PLAN OF CARE: Admit for overnight observation  PATIENT DISPOSITION:  PACU - hemodynamically stable.   Delay start of Pharmacological VTE agent (>24hrs) due to surgical blood loss or risk of bleeding:  yes  Please fax a copy of this op note to my office at [phone] (please only include page 1 and 2 of the Case Information op note)

## 2023-08-25 NOTE — Transfer of Care (Signed)
Immediate Anesthesia Transfer of Care Note  Patient: Roger Kent  Procedure(s) Performed: TOTAL KNEE ARTHROPLASTY (Left: Knee)  Patient Location: PACU  Anesthesia Type:Spinal  Level of Consciousness: awake and alert   Airway & Oxygen Therapy: Patient Spontanous Breathing and Patient connected to face mask oxygen  Post-op Assessment: Report given to RN and Post -op Vital signs reviewed and stable  Post vital signs: Reviewed and stable  Last Vitals:  Vitals Value Taken Time  BP 111/72 08/25/23 1022  Temp    Pulse 61 08/25/23 1023  Resp 12 08/25/23 1023  SpO2 97 % 08/25/23 1023  Vitals shown include unfiled device data.  Last Pain:  Vitals:   08/25/23 0750  TempSrc:   PainSc: 0-No pain         Complications: No notable events documented.

## 2023-08-25 NOTE — Evaluation (Signed)
Physical Therapy Evaluation Patient Details Name: Roger Kent MRN: [id number] DOB: 10-Mar-1946 Today's Date: 08/25/2023  History of Present Illness  Pt is 77 yo male admitted on 1/0/7/24 for L TKA. Pt with hx including but not limited to neuropathy, CAD, bil DVT, hearing loss, osteoporosis  Clinical Impression  Pt is s/p TKA resulting in the deficits listed below (see PT Problem List). At baseline, pt active and independent.  He has home support and has already received his RW.  Today, pt very motivated, good quad activation, and good pain control.  He was CGA for transfers and ambulated 62' with RW and CGA.  Pt expected to progress very well with therapy. Pt will benefit from acute skilled PT to increase their independence and safety with mobility to allow discharge.          If plan is discharge home, recommend the following: Help with stairs or ramp for entrance;A little help with bathing/dressing/bathroom;A little help with walking and/or transfers;Assistance with cooking/housework   Can travel by private vehicle        Equipment Recommendations None recommended by PT  Recommendations for Other Services       Functional Status Assessment Patient has had a recent decline in their functional status and demonstrates the ability to make significant improvements in function in a reasonable and predictable amount of time.     Precautions / Restrictions Precautions Precautions: Fall Restrictions Weight Bearing Restrictions: Yes LLE Weight Bearing: Weight bearing as tolerated      Mobility  Bed Mobility Overal bed mobility: Needs Assistance Bed Mobility: Supine to Sit     Supine to sit: Contact guard          Transfers Overall transfer level: Needs assistance Equipment used: Rolling walker (2 wheels) Transfers: Sit to/from Stand Sit to Stand: Contact guard assist           General transfer comment: Cues for hand placement and L LE management     Ambulation/Gait Ambulation/Gait assistance: Contact guard assist Gait Distance (Feet): 60 Feet Assistive device: Rolling walker (2 wheels) Gait Pattern/deviations: Step-through pattern, Decreased stride length Gait velocity: decreased     General Gait Details: cues for sequencing and RW proximity; tolerated well  Stairs            Wheelchair Mobility     Tilt Bed    Modified Rankin (Stroke Patients Only)       Balance Overall balance assessment: Needs assistance Sitting-balance support: No upper extremity supported Sitting balance-Leahy Scale: Good     Standing balance support: Bilateral upper extremity supported, Reliant on assistive device for balance Standing balance-Leahy Scale: Poor Standing balance comment: steady with RW                             Pertinent Vitals/Pain Pain Assessment Pain Assessment: 0-10 Pain Score: 3  Pain Location: L knee Pain Descriptors / Indicators: Discomfort, Sore Pain Intervention(s): Limited activity within patient's tolerance, Monitored during session, Repositioned, Ice applied, Premedicated before session    Home Living Family/patient expects to be discharged to:: Private residence Living Arrangements: Spouse/significant other Available Help at Discharge: Family;Available 24 hours/day;Neighbor Type of Home: House Home Access: Stairs to enter   [employer] of Steps: 6 in front no rails; 5 in garage L rail   Home Layout: One level Home Equipment: Grab bars - tub/shower;Shower seat - built in;Hand held Programmer, systems (2 wheels);Cane - single point  Prior Function Prior Level of Function : Independent/Modified Independent             Mobility Comments: Could ambulate without AD; no falls ADLs Comments: independent adls and iadls; 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     Extremity/Trunk Assessment   Upper Extremity Assessment Upper Extremity  Assessment: Overall WFL for tasks assessed    Lower Extremity Assessment Lower Extremity Assessment: LLE deficits/detail;RLE deficits/detail RLE Deficits / Details: ROM WFL; MMT 5/5 RLE Sensation: WNL LLE Deficits / Details: Expected post op changes; ROM: ankle and hip WFL, knee ~5 to 70 degrees; MMT: ankle 5/5, knee and hip 3/5 not further tested; no extensor lag LLE Sensation: WNL    Cervical / Trunk Assessment Cervical / Trunk Assessment: Other exceptions Cervical / Trunk Exceptions: reports scolosis  Communication      Cognition Arousal: Alert Behavior During Therapy: WFL for tasks assessed/performed Overall Cognitive Status: Within Functional Limits for tasks assessed                                 General Comments: Pt reports likes specifics - time frames, reps, etc        General Comments General comments (skin integrity, edema, etc.): VSS; small amount of blood on sheets upon inspection was from penis around catheter - notified RN  Hx of DVT encouraged to perform ankle pumps frequently.  Also , discussed gentle quad sets 10 reps 5-6 times throughout night as able.  Educated on resting with leg straight but if feeling stiff is allowed to move/bend knee at times but to rest with it straight.  Pt asking about CPM and bone foam - instructed to follow program as directed by surgeon (pt aware CPM 2 hr 4 x day  and reports was told working up to 1 hr at a time on bone foam).  Pt also reports nursing told him not to be OOB - clarified that he is not to be up alone, but it is ok and necessary/encouraged to be OOB with staff assist.    Exercises Total Joint Exercises Ankle Circles/Pumps: AROM, Both, 10 reps, Supine Quad Sets: AROM, Both, 10 reps, Supine Other Exercises Other Exercises:    Assessment/Plan    PT Assessment Patient needs continued PT services  PT Problem List Decreased strength;Pain;Decreased range of motion;Decreased activity tolerance;Decreased  balance;Decreased mobility;Decreased knowledge of precautions;Decreased knowledge of use of DME       PT Treatment Interventions DME instruction;Therapeutic exercise;Gait training;Balance training;Stair training;Modalities;Functional mobility training;Therapeutic activities;Patient/family education    PT Goals (Current goals can be found in the Care Plan section)  Acute Rehab PT Goals Patient Stated Goal: return home PT Goal Formulation: With patient/family Time For Goal Achievement: 09/08/23 Potential to Achieve Goals: Good    Frequency 7X/week     Co-evaluation               AM-PAC PT "6 Clicks" Mobility  Outcome Measure Help needed turning from your back to your side while in a flat bed without using bedrails?: A Little Help needed moving from lying on your back to sitting on the side of a flat bed without using bedrails?: A Little Help needed moving to and from a bed to a chair (including a wheelchair)?: A Little Help needed standing up from a chair using your arms (e.g., wheelchair or bedside chair)?: A Little Help needed to walk in hospital room?: A Little Help needed  climbing 3-5 steps with a railing? : A Little 6 Click Score: 18    End of Session Equipment Utilized During Treatment: Gait belt Activity Tolerance: Patient tolerated treatment well Patient left: with chair alarm set;in chair;with call bell/phone within reach;with SCD's reapplied;with family/visitor present Nurse Communication: Mobility status PT Visit Diagnosis: Other abnormalities of gait and mobility (R26.89);Muscle weakness (generalized) (M62.81)    Time: 6213-0865 PT Time Calculation (min) (ACUTE ONLY): 39 min   Charges:   PT Evaluation $PT Eval Low Complexity: 1 Low PT Treatments $Gait Training: 8-22 mins $Therapeutic Exercise: 8-22 mins PT General Charges $$ ACUTE PT VISIT: 1 Visit         Anise Salvo, PT Acute Rehab [facility] [phone]   Rayetta Humphrey 08/25/2023, 5:25 PM

## 2023-08-25 NOTE — Anesthesia Procedure Notes (Signed)
Date/Time: 08/25/2023 8:21 AM  Performed by: Florene Route, CRNAOxygen Delivery Method: Simple face mask
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
## 2023-08-25 NOTE — Plan of Care (Signed)

## 2023-08-25 NOTE — Anesthesia Postprocedure Evaluation (Signed)
Anesthesia Post Note  Patient: Roger Kent  Procedure(s) Performed: TOTAL KNEE ARTHROPLASTY (Left: Knee)     Patient location during evaluation: PACU Anesthesia Type: Regional, MAC and Spinal Level of consciousness: awake and alert and oriented Pain management: pain level controlled Vital Signs Assessment: post-procedure vital signs reviewed and stable Respiratory status: spontaneous breathing, nonlabored ventilation and respiratory function stable Cardiovascular status: blood pressure returned to baseline and stable Postop Assessment: no headache, no backache, spinal receding and patient able to bend at knees Anesthetic complications: no   No notable events documented.  Last Vitals:  Vitals:   08/25/23 1130 08/25/23 1145  BP: 116/77 120/77  Pulse: 62 62  Resp: 19 13  Temp:    SpO2: 95% 97%    Last Pain:  Vitals:   08/25/23 1145  TempSrc:   PainSc: 0-No pain                 Lannie Fields

## 2023-08-25 NOTE — Progress Notes (Signed)
Orthopedic Tech Progress Note Patient Details:  Roger Kent [facility] 1946-04-21 [id number]  CPM Left Knee CPM Left Knee: Off Left Knee Flexion (Degrees): 90 Left Knee Extension (Degrees): 0  Post Interventions Patient Tolerated: Well Instructions Provided: Care of device  [name] 08/25/2023, 2:31 PM

## 2023-08-25 NOTE — Anesthesia Procedure Notes (Signed)
Anesthesia Regional Block: Adductor canal block   Pre-Anesthetic Checklist: , timeout performed,  Correct Patient, Correct Site, Correct Laterality,  Correct Procedure, Correct Position, site marked,  Risks and benefits discussed,  Surgical consent,  Pre-op evaluation,  At surgeon's request and post-op pain management  Laterality: Left  Prep: Maximum Sterile Barrier Precautions used, chloraprep       Needles:  Injection technique: Single-shot  Needle Type: Echogenic Stimulator Needle     Needle Length: 9cm  Needle Gauge: 22     Additional Needles:   Procedures:,,,, ultrasound used (permanent image in chart),,    Narrative:  Start time: 08/25/2023 7:45 AM End time: 08/25/2023 7:50 AM Injection made incrementally with aspirations every 5 mL.  Performed by: Personally  Anesthesiologist: Lannie Fields, DO  Additional Notes: Monitors applied. No increased pain on injection. No increased resistance to injection. Injection made in 5cc increments. Good needle visualization. Patient tolerated procedure well.

## 2023-08-26 ENCOUNTER — Encounter (HOSPITAL_COMMUNITY): Payer: Self-pay | Admitting: Orthopedic Surgery

## 2023-08-26 DIAGNOSIS — I10 Essential (primary) hypertension: Secondary | ICD-10-CM | POA: Diagnosis not present

## 2023-08-26 DIAGNOSIS — Z7982 Long term (current) use of aspirin: Secondary | ICD-10-CM | POA: Diagnosis not present

## 2023-08-26 DIAGNOSIS — Z85828 Personal history of other malignant neoplasm of skin: Secondary | ICD-10-CM | POA: Diagnosis not present

## 2023-08-26 DIAGNOSIS — M1712 Unilateral primary osteoarthritis, left knee: Secondary | ICD-10-CM | POA: Diagnosis not present

## 2023-08-26 DIAGNOSIS — Z86718 Personal history of other venous thrombosis and embolism: Secondary | ICD-10-CM | POA: Diagnosis not present

## 2023-08-26 DIAGNOSIS — Z79899 Other long term (current) drug therapy: Secondary | ICD-10-CM | POA: Diagnosis not present

## 2023-08-26 MED ORDER — METHOCARBAMOL 500 MG PO TABS: 500.0000 mg | ORAL_TABLET | Freq: Four times a day (QID) | ORAL | 0 refills | Status: AC | PRN

## 2023-08-26 MED ORDER — OXYCODONE HCL 5 MG PO TABS: 5.0000 mg | ORAL_TABLET | Freq: Four times a day (QID) | ORAL | 0 refills | Status: DC | PRN

## 2023-08-26 NOTE — Care Management Obs Status (Signed)
MEDICARE OBSERVATION STATUS NOTIFICATION   Patient Details  Name: Roger Kent MRN: [id number] Date of Birth: 1946-07-29   Medicare Observation Status Notification Given:  Yes    Ewing Schlein, LCSW 08/26/2023, 9:37 AM

## 2023-08-26 NOTE — Progress Notes (Signed)
Provided discharge education/instructions to Pt and wife, all questions and concerns addressed. Pt is not in any distress, discharged home with belongings accompanied by wife.

## 2023-08-26 NOTE — Discharge Summary (Signed)
SPORTS MEDICINE & JOINT REPLACEMENT   Georgena Spurling, MD   Laurier Nancy, PA-C [address] [place], [place], [place]  [address]                             [phone]  PATIENT ID: Roger Kent        MRN:  [id number]          DOB/AGE: 1946-07-07 / 77 y.o.    DISCHARGE SUMMARY  ADMISSION DATE:    08/25/2023 DISCHARGE DATE:   08/26/2023   ADMISSION DIAGNOSIS: S/P total knee replacement [Z96.659]    DISCHARGE DIAGNOSIS:  Osteoarthritis left knee  M17.12    ADDITIONAL DIAGNOSIS: Principal Problem:   S/P total knee replacement  Past Medical History:  Diagnosis Date   Acute hepatitis C without mention of hepatic coma(070.51)    Allergic rhinitis, cause unspecified    Anxiety state, unspecified    Arthritis    Barrett's esophagus    BPH (benign prostatic hyperplasia)    Cancer (HCC)    skin: face,scalp   DVT (deep venous thrombosis) (HCC)    Elevated PSA    Dr Vernie Ammons   Esophageal reflux    Heart murmur    Hepatitis C    Hiatal hernia    Hyperlipidemia    Hypertension    Hypertonicity of bladder    Insomnia    Iron deficiency anemia 04/30/2022   Iron malabsorption 04/30/2022   Leg DVT (deep venous thromboembolism), chronic, unspecified laterality (HCC) 09/21/2018   Osteoporosis, unspecified    Peripheral vascular disease (HCC)    Personal history of urinary calculi    Recent retinal detachment, partial, with giant tear    Unspecified tinnitus    Ventricular septal defect     PROCEDURE: Procedure(s): TOTAL KNEE ARTHROPLASTY on 08/25/2023  CONSULTS:    HISTORY:  See H&P in chart  HOSPITAL COURSE:  Roger Kent is a 77 y.o. admitted on 08/25/2023 and found to have a diagnosis of Osteoarthritis left knee  M17.12.  After appropriate laboratory studies were obtained  they were taken to the operating room on 08/25/2023 and underwent Procedure(s): TOTAL KNEE ARTHROPLASTY.   They were given perioperative antibiotics:  Anti-infectives (From admission,  onward)    Start     Dose/Rate Route Frequency Ordered Stop   08/25/23 0700  ceFAZolin (ANCEF) IVPB 2g/100 mL premix        2 g 200 mL/hr over 30 Minutes Intravenous On call to O.R. 08/25/23 4696 08/25/23 2952     .  Patient given tranexamic acid IV or topical and exparel intra-operatively.  Tolerated the procedure well.    POD# 1: Vital signs were stable.  Patient denied Chest pain, shortness of breath, or calf pain.  Patient was started on Aspirin twice daily at 8am.  Consults to PT, OT, and care management were made.  The patient was weight bearing as tolerated.  CPM was placed on the operative leg 0-90 degrees for 6-8 hours a day. When out of the CPM, patient was placed in the foam block to achieve full extension. Incentive spirometry was taught.  Dressing was changed.       POD #2, Continued  PT for ambulation and exercise program.  IV saline locked.  O2 discontinued.    The remainder of the hospital course was dedicated to ambulation and strengthening.   The patient was discharged on 1 Day Post-Op in  Good condition.  Blood  products given:none  DIAGNOSTIC STUDIES: Recent vital signs: Patient Vitals for the past 24 hrs:  BP Temp Temp src Pulse Resp SpO2  08/26/23 0457 117/68 -- -- 68 -- --  08/26/23 0443 (!) 104/39 98.1 F (36.7 C) -- 70 15 94 %  08/26/23 0049 112/66 98 F (36.7 C) -- 75 18 93 %  08/25/23 2145 (!) 131/120 97.8 F (36.6 C) -- 79 15 95 %  08/25/23 1739 (!) 124/92 98.4 F (36.9 C) Oral 77 20 97 %  08/25/23 1521 131/84 98.7 F (37.1 C) Oral 79 18 97 %  08/25/23 1340 122/77 (!) 97.5 F (36.4 C) Oral 72 15 93 %  08/25/23 1300 131/84 -- -- 60 12 95 %  08/25/23 1215 122/78 -- -- 68 (!) 23 96 %  08/25/23 1200 102/85 -- -- 60 17 97 %  08/25/23 1145 120/77 -- -- 62 13 97 %  08/25/23 1130 116/77 -- -- 62 19 95 %  08/25/23 1115 118/70 -- -- 66 13 91 %  08/25/23 1100 122/75 -- -- (!) 58 19 91 %  08/25/23 1045 121/73 -- -- 65 17 92 %  08/25/23 1030 107/71 -- --  64 (!) 8 92 %  08/25/23 1022 111/72 (!) 97.1 F (36.2 C) -- 63 12 96 %       Recent laboratory studies: No results for input(s): "WBC", "HGB", "HCT", "PLT" in the last 168 hours. No results for input(s): "NA", "K", "CL", "CO2", "BUN", "CREATININE", "GLUCOSE", "CALCIUM" in the last 168 hours. Lab Results  Component Value Date   INR 2.4 RATIO (H) 09/17/2007   INR 1.5 RATIO (H) 09/14/2007   INR 1.8 RATIO (H) 09/11/2007     Recent Radiographic Studies :  No results found.  DISCHARGE INSTRUCTIONS: Discharge Instructions     Call MD / Call 911   Complete by: As directed    If you experience chest pain or shortness of breath, CALL 911 and be transported to the hospital emergency room.  If you develope a fever above 101 F, pus (white drainage) or increased drainage or redness at the wound, or calf pain, call your surgeon's office.   Constipation Prevention   Complete by: As directed    Drink plenty of fluids.  Prune juice may be helpful.  You may use a stool softener, such as Colace (over the counter) 100 mg twice a day.  Use MiraLax (over the counter) for constipation as needed.   Diet - low sodium heart healthy   Complete by: As directed    Discharge instructions   Complete by: As directed    INSTRUCTIONS AFTER JOINT REPLACEMENT   Remove items at home which could result in a fall. This includes throw rugs or furniture in walking pathways ICE to the affected joint every three hours while awake for 30 minutes at a time, for at least the first 3-5 days, and then as needed for pain and swelling.  Continue to use ice for pain and swelling. You may notice swelling that will progress down to the foot and ankle.  This is normal after surgery.  Elevate your leg when you are not up walking on it.   Continue to use the breathing machine you got in the hospital (incentive spirometer) which will help keep your temperature down.  It is common for your temperature to cycle up and down following surgery,  especially at night when you are not up moving around and exerting yourself.  The breathing machine keeps your  lungs expanded and your temperature down.   DIET:  As you were doing prior to hospitalization, we recommend a well-balanced diet.  DRESSING / WOUND CARE / SHOWERING  Keep the surgical dressing until follow up.  The dressing is water proof, so you can shower without any extra covering.  IF THE DRESSING FALLS OFF or the wound gets wet inside, change the dressing with sterile gauze.  Please use good hand washing techniques before changing the dressing.  Do not use any lotions or creams on the incision until instructed by your surgeon.    ACTIVITY  Increase activity slowly as tolerated, but follow the weight bearing instructions below.   No driving for 6 weeks or until further direction given by your physician.  You cannot drive while taking narcotics.  No lifting or carrying greater than 10 lbs. until further directed by your surgeon. Avoid periods of inactivity such as sitting longer than an hour when not asleep. This helps prevent blood clots.  You may return to work once you are authorized by your doctor.     WEIGHT BEARING   Weight bearing as tolerated with assist device (walker, cane, etc) as directed, use it as long as suggested by your surgeon or therapist, typically at least 4-6 weeks.   EXERCISES  Results after joint replacement surgery are often greatly improved when you follow the exercise, range of motion and muscle strengthening exercises prescribed by your doctor. Safety measures are also important to protect the joint from further injury. Any time any of these exercises cause you to have increased pain or swelling, decrease what you are doing until you are comfortable again and then slowly increase them. If you have problems or questions, call your caregiver or physical therapist for advice.   Rehabilitation is important following a joint replacement. After just a few  days of immobilization, the muscles of the leg can become weakened and shrink (atrophy).  These exercises are designed to build up the tone and strength of the thigh and leg muscles and to improve motion. Often times heat used for twenty to thirty minutes before working out will loosen up your tissues and help with improving the range of motion but do not use heat for the first two weeks following surgery (sometimes heat can increase post-operative swelling).   These exercises can be done on a training (exercise) mat, on the floor, on a table or on a bed. Use whatever works the best and is most comfortable for you.    Use music or television while you are exercising so that the exercises are a pleasant break in your day. This will make your life better with the exercises acting as a break in your routine that you can look forward to.   Perform all exercises about fifteen times, three times per day or as directed.  You should exercise both the operative leg and the other leg as well.  Exercises include:   Quad Sets - Tighten up the muscle on the front of the thigh (Quad) and hold for 5-10 seconds.   Straight Leg Raises - With your knee straight (if you were given a brace, keep it on), lift the leg to 60 degrees, hold for 3 seconds, and slowly lower the leg.  Perform this exercise against resistance later as your leg gets stronger.  Leg Slides: Lying on your back, slowly slide your foot toward your buttocks, bending your knee up off the floor (only go as far as is comfortable). Then  slowly slide your foot back down until your leg is flat on the floor again.  Angel Wings: Lying on your back spread your legs to the side as far apart as you can without causing discomfort.  Hamstring Strength:  Lying on your back, push your heel against the floor with your leg straight by tightening up the muscles of your buttocks.  Repeat, but this time bend your knee to a comfortable angle, and push your heel against the  floor.  You may put a pillow under the heel to make it more comfortable if necessary.   A rehabilitation program following joint replacement surgery can speed recovery and prevent re-injury in the future due to weakened muscles. Contact your doctor or a physical therapist for more information on knee rehabilitation.    CONSTIPATION  Constipation is defined medically as fewer than three stools per week and severe constipation as less than one stool per week.  Even if you have a regular bowel pattern at home, your normal regimen is likely to be disrupted due to multiple reasons following surgery.  Combination of anesthesia, postoperative narcotics, change in appetite and fluid intake all can affect your bowels.   YOU MUST use at least one of the following options; they are listed in order of increasing strength to get the job done.  They are all available over the counter, and you may need to use some, POSSIBLY even all of these options:    Drink plenty of fluids (prune juice may be helpful) and high fiber foods Colace 100 mg by mouth twice a day  Senokot for constipation as directed and as needed Dulcolax (bisacodyl), take with full glass of water  Miralax (polyethylene glycol) once or twice a day as needed.  If you have tried all these things and are unable to have a bowel movement in the first 3-4 days after surgery call either your surgeon or your primary doctor.    If you experience loose stools or diarrhea, hold the medications until you stool forms back up.  If your symptoms do not get better within 1 week or if they get worse, check with your doctor.  If you experience "the worst abdominal pain ever" or develop nausea or vomiting, please contact the office immediately for further recommendations for treatment.   ITCHING:  If you experience itching with your medications, try taking only a single pain pill, or even half a pain pill at a time.  You can also use Benadryl over the counter for  itching or also to help with sleep.   TED HOSE STOCKINGS:  Use stockings on both legs until for at least 2 weeks or as directed by physician office. They may be removed at night for sleeping.  MEDICATIONS:  See your medication summary on the "After Visit Summary" that nursing will review with you.  You may have some home medications which will be placed on hold until you complete the course of blood thinner medication.  It is important for you to complete the blood thinner medication as prescribed.  PRECAUTIONS:  If you experience chest pain or shortness of breath - call 911 immediately for transfer to the hospital emergency department.   If you develop a fever greater that 101 F, purulent drainage from wound, increased redness or drainage from wound, foul odor from the wound/dressing, or calf pain - CONTACT YOUR SURGEON.  FOLLOW-UP APPOINTMENTS:  If you do not already have a post-op appointment, please call the office for an appointment to be seen by your surgeon.  Guidelines for how soon to be seen are listed in your "After Visit Summary", but are typically between 1-4 weeks after surgery.  OTHER INSTRUCTIONS:   Knee Replacement:  Do not place pillow under knee, focus on keeping the knee straight while resting. CPM instructions: 0-90 degrees, 2 hours in the morning, 2 hours in the afternoon, and 2 hours in the evening. Place foam block, curve side up under heel at all times except when in CPM or when walking.  DO NOT modify, tear, cut, or change the foam block in any way.  POST-OPERATIVE OPIOID TAPER INSTRUCTIONS: It is important to wean off of your opioid medication as soon as possible. If you do not need pain medication after your surgery it is ok to stop day one. Opioids include: Codeine, Hydrocodone(Norco, Vicodin), Oxycodone(Percocet, oxycontin) and hydromorphone amongst others.  Long term and even short term use of opiods can  cause: Increased pain response Dependence Constipation Depression Respiratory depression And more.  Withdrawal symptoms can include Flu like symptoms Nausea, vomiting And more Techniques to manage these symptoms Hydrate well Eat regular healthy meals Stay active Use relaxation techniques(deep breathing, meditating, yoga) Do Not substitute Alcohol to help with tapering If you have been on opioids for less than two weeks and do not have pain than it is ok to stop all together.  Plan to wean off of opioids This plan should start within one week post op of your joint replacement. Maintain the same interval or time between taking each dose and first decrease the dose.  Cut the total daily intake of opioids by one tablet each day Next start to increase the time between doses. The last dose that should be eliminated is the evening dose.     MAKE SURE YOU:  Understand these instructions.  Get help right away if you are not doing well or get worse.    Thank you for letting us be a part of your medical care team.  It is a privilege we respect greatly.  We hope these instructions will help you stay on track for a fast and full recovery!   Increase activity slowly as tolerated   Complete by: As directed    Post-operative opioid taper instructions:   Complete by: As directed    POST-OPERATIVE OPIOID TAPER INSTRUCTIONS: It is important to wean off of your opioid medication as soon as possible. If you do not need pain medication after your surgery it is ok to stop day one. Opioids include: Codeine, Hydrocodone(Norco, Vicodin), Oxycodone(Percocet, oxycontin) and hydromorphone amongst others.  Long term and even short term use of opiods can cause: Increased pain response Dependence Constipation Depression Respiratory depression And more.  Withdrawal symptoms can include Flu like symptoms Nausea, vomiting And more Techniques to manage these symptoms Hydrate well Eat regular  healthy meals Stay active Use relaxation techniques(deep breathing, meditating, yoga) Do Not substitute Alcohol to help with tapering If you have been on opioids for less than two weeks and do not have pain than it is ok to stop all together.  Plan to wean off of opioids This plan should start within one week post op of your joint replacement. Maintain the same interval or time between taking each dose and first decrease the dose.  Cut the total daily intake of opioids by one tablet each day Next start to  increase the time between doses. The last dose that should be eliminated is the evening dose.          DISCHARGE MEDICATIONS:   Allergies as of 08/26/2023       Reactions   Oxycodone-acetaminophen Other (See Comments)   Other Reaction: agitation   Crestor [rosuvastatin] Other (See Comments)   Muscle pain   Zetia [ezetimibe]    Brain fog, felt sluggish   Lamisil [terbinafine] Rash        Medication List     STOP taking these medications    aspirin 325 MG tablet       TAKE these medications    Bromfenac Sodium 0.07 % Soln Place 1 drop into both eyes at bedtime.   busPIRone 7.5 MG tablet Commonly known as: BUSPAR Take 1 tablet (7.5 mg total) by mouth 2 (two) times daily.   cetirizine 10 MG tablet Commonly known as: ZYRTEC Take 10 mg by mouth daily.   CO Q 10 PO Take 300 mg by mouth daily.   COLLAGEN PO Take 1 capsule by mouth daily. collagen hydrolyzed   EPINEPHrine 0.3 mg/0.3 mL Soaj injection Commonly known as: EpiPen 2-Pak Inject 0.3 mLs (0.3 mg total) into the muscle as needed for anaphylaxis.   esomeprazole 20 MG capsule Commonly known as: NexIUM Take 1 capsule (20 mg total) by mouth daily. 30 minutes to an hour before meals   ferrous sulfate 325 (65 FE) MG tablet Take 1 tablet (325 mg total) by mouth daily with breakfast. What changed: when to take this   GLUCOSAMINE PO Take 3,000 mg by mouth daily. MSM 1500 mg  1500 mg   HYALURONIC ACID  PO Take 2,000 mg by mouth daily. 1000 mg each   MAGNESIUM GLUCONATE PO Take 120 mg by mouth daily.   methocarbamol 500 MG tablet Commonly known as: ROBAXIN Take 1-2 tablets (500-1,000 mg total) by mouth every 6 (six) hours as needed for muscle spasms.   Mucinex DM Maximum Strength 60-1200 MG Tb12 Take 1 tablet by mouth daily.   olmesartan 40 MG tablet Commonly known as: BENICAR TAKE 1 TABLET DAILY   OVER THE COUNTER MEDICATION Take 1 Package by mouth daily. Relief factor  4 tablet each package   OVER THE COUNTER MEDICATION Take 2 tablets by mouth daily. Better Bladder 840 mg   OVER THE COUNTER MEDICATION Take 1 tablet by mouth daily. Nitricoxide Niacin   OVER THE COUNTER MEDICATION Take 6 tablets by mouth daily. Super fruit 3 each Super veggie 3 each   OVER THE COUNTER MEDICATION Take 1 Scoop by mouth daily. Orgain organic protein powder Adds/ vital protein collagen peptides 2-4 table spoons   OVER THE COUNTER MEDICATION Take 2 tablets by mouth daily. Super beets chews   OVER THE COUNTER MEDICATION Place 1 application  into both nostrils daily. navage nasal care   oxyCODONE 5 MG immediate release tablet Commonly known as: Oxy IR/ROXICODONE Take 1-2 tablets (5-10 mg total) by mouth every 6 (six) hours as needed for moderate pain (pain score 4-6).   PREVAGEN PO Take 1 capsule by mouth daily. Pro   PROBIOTIC PO Take 1 capsule by mouth daily. 60 billion   Repatha SureClick 140 MG/ML Soaj Generic drug: Evolocumab INJECT 140MG  INTO THE SKIN ONCE EVERY 14 DAYS   rivaroxaban 10 MG Tabs tablet Commonly known as: XARELTO Take 1 tablet (10 mg total) by mouth daily. Start taking on 08/13/2023.  Take last dose before surgery on 10/04.   Re-start  AFTER surgery on 10/09.  Take for 6 weeks.   simethicone 125 MG chewable tablet Commonly known as: MYLICON Chew 125 mg by mouth every 6 (six) hours as needed for flatulence.               Durable Medical Equipment   (From admission, onward)           Start     Ordered   08/25/23 1343  DME Walker rolling  Once       Question:  Patient needs a walker to treat with the following condition  Answer:  S/P total knee replacement   08/25/23 1342   08/25/23 1343  DME 3 n 1  Once        08/25/23 1342   08/25/23 1343  DME Bedside commode  Once       Question:  Patient needs a bedside commode to treat with the following condition  Answer:  S/P total knee replacement   08/25/23 1342            FOLLOW UP VISIT:    DISPOSITION: HOME VS. SNF  Dental Antibiotics:  In most cases prophylactic antibiotics for Dental procdeures after total joint surgery are not necessary.  Exceptions are as follows:  1. History of prior total joint infection  2. Severely immunocompromised (Organ Transplant, cancer chemotherapy, Rheumatoid biologic meds such as Humera)  3. Poorly controlled diabetes (A1C &gt; 8.0, blood glucose over 200)  If you have one of these conditions, contact your surgeon for an antibiotic prescription, prior to your dental procedure.   CONDITION:  Good   Guy Sandifer 08/26/2023, 7:53 AM

## 2023-08-26 NOTE — Progress Notes (Signed)
SPORTS MEDICINE AND JOINT REPLACEMENT  Georgena Spurling, MD    Laurier Nancy, PA-C [address] [place], [place], [place]  [address]                             [phone]   PROGRESS NOTE  Subjective:  negative for Chest Pain  negative for Shortness of Breath  negative for Nausea/Vomiting   negative for Calf Pain  negative for Bowel Movement   Tolerating Diet: yes         Patient reports pain as 5 on 0-10 scale.    Objective: Vital signs in last 24 hours:   Patient Vitals for the past 24 hrs:  BP Temp Temp src Pulse Resp SpO2  08/26/23 0457 117/68 -- -- 68 -- --  08/26/23 0443 (!) 104/39 98.1 F (36.7 C) -- 70 15 94 %  08/26/23 0049 112/66 98 F (36.7 C) -- 75 18 93 %  08/25/23 2145 (!) 131/120 97.8 F (36.6 C) -- 79 15 95 %  08/25/23 1739 (!) 124/92 98.4 F (36.9 C) Oral 77 20 97 %  08/25/23 1521 131/84 98.7 F (37.1 C) Oral 79 18 97 %  08/25/23 1340 122/77 (!) 97.5 F (36.4 C) Oral 72 15 93 %  08/25/23 1300 131/84 -- -- 60 12 95 %  08/25/23 1215 122/78 -- -- 68 (!) 23 96 %  08/25/23 1200 102/85 -- -- 60 17 97 %  08/25/23 1145 120/77 -- -- 62 13 97 %  08/25/23 1130 116/77 -- -- 62 19 95 %  08/25/23 1115 118/70 -- -- 66 13 91 %  08/25/23 1100 122/75 -- -- (!) 58 19 91 %  08/25/23 1045 121/73 -- -- 65 17 92 %  08/25/23 1030 107/71 -- -- 64 (!) 8 92 %  08/25/23 1022 111/72 (!) 97.1 F (36.2 C) -- 63 12 96 %    @flow {1959:LAST@   Intake/Output from previous day:   10/07 0701 - 10/08 0700 In: 3487.4 [P.O.:660; I.V.:2377.4] Out: 3375 [Urine:3100]   Intake/Output this shift:   No intake/output data recorded.   Intake/Output      10/07 0701 10/08 0700 10/08 0701 10/09 0700   P.O. 660    I.V. (mL/kg) 2377.4 (28.3)    IV Piggyback 450    Total Intake(mL/kg) 3487.4 (41.6)    Urine (mL/kg/hr) 3100 (1.5)    Blood 275    Total Output 3375    Net +112.4            LABORATORY DATA: No results for input(s): "WBC", "HGB", "HCT", "PLT" in the last 168 hours. No  results for input(s): "NA", "K", "CL", "CO2", "BUN", "CREATININE", "GLUCOSE", "CALCIUM" in the last 168 hours. Lab Results  Component Value Date   INR 2.4 RATIO (H) 09/17/2007   INR 1.5 RATIO (H) 09/14/2007   INR 1.8 RATIO (H) 09/11/2007    Examination:  General appearance: alert, cooperative, and no distress Extremities: extremities normal, atraumatic, no cyanosis or edema  Wound Exam: clean, dry, intact   Drainage:  None: wound tissue dry  Motor Exam: Quadriceps and Hamstrings Intact  Sensory Exam: Superficial Peroneal, Deep Peroneal, and Tibial normal   Assessment:    1 Day Post-Op  Procedure(s) (LRB): TOTAL KNEE ARTHROPLASTY (Left)  ADDITIONAL DIAGNOSIS:  Principal Problem:   S/P total knee replacement     Plan: Physical Therapy as ordered Weight Bearing as Tolerated (WBAT)  DVT Prophylaxis:  Xarelto  DISCHARGE  PLAN: Home   Patient doing well and ready for DC home      Patient's anticipated LOS is less than 2 midnights, meeting these requirements: - Younger than 32 - Lives within 1 hour of care - Has a competent adult at home to recover with post-op recover - NO history of  - Chronic pain requiring opiods  - Diabetes  - Coronary Artery Disease  - Heart failure  - Heart attack  - Stroke  - DVT/VTE  - Cardiac arrhythmia  - Respiratory Failure/COPD  - Renal failure  - Anemia  - Advanced Liver disease      Guy Sandifer 08/26/2023, 7:52 AM

## 2023-08-26 NOTE — TOC Transition Note (Signed)
Transition of Care [facility]) - CM/SW Discharge Note  Patient Details  Name: Roger Kent MRN: [id number] Date of Birth: 1946-02-09  Transition of Care [facility]) CM/SW Contact:  Ewing Schlein, LCSW Phone Number: 08/26/2023, 11:23 AM  Clinical Narrative: Patient is expected to discharge home after working with PT. CSW met with patient to confirm discharge plan and needs. Patient will go home with OPPT in Dr. Tobin Chad office. Patient was set up with a rolling walker and CPM through [employer] prior to surgery, so there are no DME needs at this time. TOC signing off.    Final next level of care: OP Rehab Barriers to Discharge: No Barriers Identified  Patient Goals and CMS Choice Choice offered to / list presented to : NA  Discharge Plan and Services Additional resources added to the After Visit Summary for          DME Arranged: N/A DME Agency: NA  Social Determinants of Health (SDOH) Interventions SDOH Screenings   Food Insecurity: No Food Insecurity (08/25/2023)  Housing: Low Risk  (08/25/2023)  Transportation Needs: No Transportation Needs (08/25/2023)  Utilities: Not At Risk (08/25/2023)  Alcohol Screen: Low Risk  (01/10/2023)  Depression (PHQ2-9): Low Risk  (05/01/2023)  Financial Resource Strain: Low Risk  (04/27/2023)  Physical Activity: Inactive (04/27/2023)  Social Connections: Socially Integrated (04/27/2023)  Stress: Stress Concern Present (04/27/2023)  Tobacco Use: Medium Risk (08/25/2023)   Readmission Risk Interventions     No data to display

## 2023-08-26 NOTE — Progress Notes (Signed)
Physical Therapy Treatment Patient Details Name: Roger Kent MRN: [id number] DOB: March 06, 1946 Today's Date: 08/26/2023   History of Present Illness Pt is 77 yo male admitted on 1/0/7/24 for L TKA. Pt with hx including but not limited to neuropathy, CAD, bil DVT, hearing loss, osteoporosis    PT Comments  POD # 1 am session General Comments: AxO x 3 "methodical" with Spouse present.  Assisted OOB to amb in hallway went well.  General bed mobility comments: demonstarted and educated how to use belt to self assist LE.  Demonstrated and educated on how to use belt to self assist LE off bed.  Had Spouse "hands on" asisst with all mobility including stair training.  Then returned to room to perform some TE's following HEP handout.  Instructed on proper tech, freq as well as use of ICE.   Addressed all mobility questions, discussed appropriate activity, educated on use of ICE.  Pt ready for D/C to home.    If plan is discharge home, recommend the following: Help with stairs or ramp for entrance;A little help with bathing/dressing/bathroom;A little help with walking and/or transfers;Assistance with cooking/housework   Can travel by private vehicle        Equipment Recommendations  None recommended by PT    Recommendations for Other Services       Precautions / Restrictions Precautions Precautions: Fall Precaution Comments: no pillow under knee Restrictions Weight Bearing Restrictions: No LLE Weight Bearing: Weight bearing as tolerated     Mobility  Bed Mobility Overal bed mobility: Needs Assistance Bed Mobility: Supine to Sit     Supine to sit: Supervision     General bed mobility comments: demonstarted and educated how to use belt to self assist LE    Transfers Overall transfer level: Needs assistance     Sit to Stand: Supervision           General transfer comment: Cues for hand placement and L LE management    Ambulation/Gait Ambulation/Gait assistance:  Supervision, Contact guard assist Gait Distance (Feet): 85 Feet Assistive device: Rolling walker (2 wheels) Gait Pattern/deviations: Step-through pattern, Decreased stride length Gait velocity: decreased     General Gait Details: cues for sequencing and RW proximity; safety with turns.  tolerated well   Stairs Stairs: Yes Stairs assistance: Supervision, Contact guard assist Stair Management: One rail Left, Step to pattern, Forwards, Sideways Number of Stairs: 6 General stair comments: with Spouse "hands on" assisted using one rail and VC's on proper sequencing/safety.  Tolerated well.   Wheelchair Mobility     Tilt Bed    Modified Rankin (Stroke Patients Only)       Balance                                            Cognition Arousal: Alert Behavior During Therapy: WFL for tasks assessed/performed Overall Cognitive Status: Within Functional Limits for tasks assessed                                 General Comments: AxO x 3 "methodical" with Spouse present        Exercises  Total Knee Replacement TE's following HEP handout 10 reps B LE ankle pumps 05 reps towel squeezes 05 reps knee presses 05 reps heel slides  05 reps SAQ's 05 reps SLR's  05 reps ABD Educated on use of gait belt to assist with TE's Followed by ICE     General Comments        Pertinent Vitals/Pain Pain Assessment Pain Assessment: 0-10 Pain Score: 6  Pain Location: L knee Pain Descriptors / Indicators: Operative site guarding, Discomfort Pain Intervention(s): Monitored during session, Premedicated before session, Repositioned, Ice applied    Home Living                          Prior Function            PT Goals (current goals can now be found in the care plan section) Progress towards PT goals: Progressing toward goals    Frequency    7X/week      PT Plan      Co-evaluation              AM-PAC PT "6 Clicks" Mobility    Outcome Measure  Help needed turning from your back to your side while in a flat bed without using bedrails?: None Help needed moving from lying on your back to sitting on the side of a flat bed without using bedrails?: None Help needed moving to and from a bed to a chair (including a wheelchair)?: None Help needed standing up from a chair using your arms (e.g., wheelchair or bedside chair)?: A Little Help needed to walk in hospital room?: A Little Help needed climbing 3-5 steps with a railing? : A Little 6 Click Score: 21    End of Session Equipment Utilized During Treatment: Gait belt Activity Tolerance: Patient tolerated treatment well Patient left: with chair alarm set;in chair;with call bell/phone within reach;with SCD's reapplied;with family/visitor present   PT Visit Diagnosis: Other abnormalities of gait and mobility (R26.89);Muscle weakness (generalized) (M62.81)     Time: 7425-9563 PT Time Calculation (min) (ACUTE ONLY): 43 min  Charges:    $Gait Training: 8-22 mins $Therapeutic Exercise: 8-22 mins $Therapeutic Activity: 8-22 mins PT General Charges $$ ACUTE PT VISIT: 1 Visit                    Felecia Shelling  PTA Acute  Rehabilitation Services Office M-F          [phone]

## 2023-08-26 NOTE — Plan of Care (Signed)

## 2023-09-04 DIAGNOSIS — M25462 Effusion, left knee: Secondary | ICD-10-CM | POA: Diagnosis not present

## 2023-09-04 DIAGNOSIS — M25662 Stiffness of left knee, not elsewhere classified: Secondary | ICD-10-CM | POA: Diagnosis not present

## 2023-09-04 DIAGNOSIS — Z7409 Other reduced mobility: Secondary | ICD-10-CM | POA: Diagnosis not present

## 2023-09-04 DIAGNOSIS — R29898 Other symptoms and signs involving the musculoskeletal system: Secondary | ICD-10-CM | POA: Diagnosis not present

## 2023-09-04 DIAGNOSIS — M25562 Pain in left knee: Secondary | ICD-10-CM | POA: Diagnosis not present

## 2023-09-04 DIAGNOSIS — Z96652 Presence of left artificial knee joint: Secondary | ICD-10-CM | POA: Diagnosis not present

## 2023-09-08 DIAGNOSIS — M25462 Effusion, left knee: Secondary | ICD-10-CM | POA: Diagnosis not present

## 2023-09-08 DIAGNOSIS — Z96652 Presence of left artificial knee joint: Secondary | ICD-10-CM | POA: Diagnosis not present

## 2023-09-08 DIAGNOSIS — M25562 Pain in left knee: Secondary | ICD-10-CM | POA: Diagnosis not present

## 2023-09-08 DIAGNOSIS — M25662 Stiffness of left knee, not elsewhere classified: Secondary | ICD-10-CM | POA: Diagnosis not present

## 2023-09-08 DIAGNOSIS — Z7409 Other reduced mobility: Secondary | ICD-10-CM | POA: Diagnosis not present

## 2023-09-08 DIAGNOSIS — R29898 Other symptoms and signs involving the musculoskeletal system: Secondary | ICD-10-CM | POA: Diagnosis not present

## 2023-09-09 ENCOUNTER — Telehealth: Payer: Self-pay | Admitting: Internal Medicine

## 2023-09-09 DIAGNOSIS — M810 Age-related osteoporosis without current pathological fracture: Secondary | ICD-10-CM

## 2023-09-09 NOTE — Telephone Encounter (Signed)
Patient wants to know what is involved with getting his bone density and just had a total knee replacement on 08/25/2023 - will this bone density scan be effected by the surgery.  Please call patients wife and advise.  Larita Fife - wife- [phone]

## 2023-09-10 ENCOUNTER — Ambulatory Visit: Payer: Medicare Other | Admitting: Family

## 2023-09-10 ENCOUNTER — Other Ambulatory Visit: Payer: Medicare Other

## 2023-09-10 NOTE — Telephone Encounter (Signed)
No.  I would wait till he is fully mobile, however.  I will reorder the test to schedule in about a month. Thank you

## 2023-09-11 DIAGNOSIS — R29898 Other symptoms and signs involving the musculoskeletal system: Secondary | ICD-10-CM | POA: Diagnosis not present

## 2023-09-11 DIAGNOSIS — Z96652 Presence of left artificial knee joint: Secondary | ICD-10-CM | POA: Diagnosis not present

## 2023-09-11 DIAGNOSIS — M25662 Stiffness of left knee, not elsewhere classified: Secondary | ICD-10-CM | POA: Diagnosis not present

## 2023-09-11 DIAGNOSIS — M25462 Effusion, left knee: Secondary | ICD-10-CM | POA: Diagnosis not present

## 2023-09-11 DIAGNOSIS — Z7409 Other reduced mobility: Secondary | ICD-10-CM | POA: Diagnosis not present

## 2023-09-11 DIAGNOSIS — M25562 Pain in left knee: Secondary | ICD-10-CM | POA: Diagnosis not present

## 2023-09-17 DIAGNOSIS — Z7409 Other reduced mobility: Secondary | ICD-10-CM | POA: Diagnosis not present

## 2023-09-17 DIAGNOSIS — Z96652 Presence of left artificial knee joint: Secondary | ICD-10-CM | POA: Diagnosis not present

## 2023-09-17 DIAGNOSIS — R29898 Other symptoms and signs involving the musculoskeletal system: Secondary | ICD-10-CM | POA: Diagnosis not present

## 2023-09-17 DIAGNOSIS — M25562 Pain in left knee: Secondary | ICD-10-CM | POA: Diagnosis not present

## 2023-09-17 DIAGNOSIS — M25662 Stiffness of left knee, not elsewhere classified: Secondary | ICD-10-CM | POA: Diagnosis not present

## 2023-09-17 DIAGNOSIS — M25462 Effusion, left knee: Secondary | ICD-10-CM | POA: Diagnosis not present

## 2023-09-19 DIAGNOSIS — M25462 Effusion, left knee: Secondary | ICD-10-CM | POA: Diagnosis not present

## 2023-09-19 DIAGNOSIS — M25662 Stiffness of left knee, not elsewhere classified: Secondary | ICD-10-CM | POA: Diagnosis not present

## 2023-09-19 DIAGNOSIS — M25562 Pain in left knee: Secondary | ICD-10-CM | POA: Diagnosis not present

## 2023-09-19 DIAGNOSIS — Z96652 Presence of left artificial knee joint: Secondary | ICD-10-CM | POA: Diagnosis not present

## 2023-09-19 DIAGNOSIS — R29898 Other symptoms and signs involving the musculoskeletal system: Secondary | ICD-10-CM | POA: Diagnosis not present

## 2023-09-19 DIAGNOSIS — Z7409 Other reduced mobility: Secondary | ICD-10-CM | POA: Diagnosis not present

## 2023-09-23 DIAGNOSIS — R29898 Other symptoms and signs involving the musculoskeletal system: Secondary | ICD-10-CM | POA: Diagnosis not present

## 2023-09-23 DIAGNOSIS — M25662 Stiffness of left knee, not elsewhere classified: Secondary | ICD-10-CM | POA: Diagnosis not present

## 2023-09-23 DIAGNOSIS — Z96652 Presence of left artificial knee joint: Secondary | ICD-10-CM | POA: Diagnosis not present

## 2023-09-23 DIAGNOSIS — Z7409 Other reduced mobility: Secondary | ICD-10-CM | POA: Diagnosis not present

## 2023-09-24 DIAGNOSIS — Z03818 Encounter for observation for suspected exposure to other biological agents ruled out: Secondary | ICD-10-CM | POA: Diagnosis not present

## 2023-09-24 DIAGNOSIS — R0602 Shortness of breath: Secondary | ICD-10-CM | POA: Diagnosis not present

## 2023-09-24 DIAGNOSIS — R051 Acute cough: Secondary | ICD-10-CM | POA: Diagnosis not present

## 2023-09-24 DIAGNOSIS — R531 Weakness: Secondary | ICD-10-CM | POA: Diagnosis not present

## 2023-09-25 DIAGNOSIS — Z7409 Other reduced mobility: Secondary | ICD-10-CM | POA: Diagnosis not present

## 2023-09-25 DIAGNOSIS — Z96652 Presence of left artificial knee joint: Secondary | ICD-10-CM | POA: Diagnosis not present

## 2023-09-25 DIAGNOSIS — R29898 Other symptoms and signs involving the musculoskeletal system: Secondary | ICD-10-CM | POA: Diagnosis not present

## 2023-09-25 DIAGNOSIS — M25562 Pain in left knee: Secondary | ICD-10-CM | POA: Diagnosis not present

## 2023-09-25 DIAGNOSIS — M25662 Stiffness of left knee, not elsewhere classified: Secondary | ICD-10-CM | POA: Diagnosis not present

## 2023-09-25 DIAGNOSIS — M25462 Effusion, left knee: Secondary | ICD-10-CM | POA: Diagnosis not present

## 2023-09-30 ENCOUNTER — Other Ambulatory Visit (HOSPITAL_COMMUNITY): Payer: Self-pay

## 2023-09-30 ENCOUNTER — Telehealth: Payer: Self-pay | Admitting: Pharmacy Technician

## 2023-09-30 DIAGNOSIS — M25562 Pain in left knee: Secondary | ICD-10-CM | POA: Diagnosis not present

## 2023-09-30 DIAGNOSIS — M1712 Unilateral primary osteoarthritis, left knee: Secondary | ICD-10-CM | POA: Diagnosis not present

## 2023-09-30 DIAGNOSIS — R29898 Other symptoms and signs involving the musculoskeletal system: Secondary | ICD-10-CM | POA: Diagnosis not present

## 2023-09-30 DIAGNOSIS — G8929 Other chronic pain: Secondary | ICD-10-CM | POA: Diagnosis not present

## 2023-09-30 DIAGNOSIS — M25662 Stiffness of left knee, not elsewhere classified: Secondary | ICD-10-CM | POA: Diagnosis not present

## 2023-09-30 DIAGNOSIS — Z96652 Presence of left artificial knee joint: Secondary | ICD-10-CM | POA: Diagnosis not present

## 2023-09-30 DIAGNOSIS — M25462 Effusion, left knee: Secondary | ICD-10-CM | POA: Diagnosis not present

## 2023-09-30 DIAGNOSIS — Z7409 Other reduced mobility: Secondary | ICD-10-CM | POA: Diagnosis not present

## 2023-09-30 NOTE — Telephone Encounter (Signed)
Pharmacy Patient Advocate Encounter  Received notification from [facility] that Prior Authorization for ESOMERAZOLE 20MG  has been APPROVED from 11.12.24 to 11.12.25. Ran test claim, Copay is $15/90 days. This test claim was processed through [facility]- copay amounts may vary at other pharmacies due to pharmacy/plan contracts, or as the patient moves through the different stages of their insurance plan.   PA #/Case ID/Reference #: [id number]

## 2023-09-30 NOTE — Telephone Encounter (Signed)
Pharmacy Patient Advocate Encounter   Received notification from CoverMyMeds that prior authorization for ESOMEPRAZOLE 20MG  is required/requested.   Insurance verification completed.   The patient is insured through [facility] .   Per test claim: PA required; PA submitted to above mentioned insurance via CoverMyMeds Key/confirmation #/EOC [id number] Status is pending

## 2023-10-01 ENCOUNTER — Ambulatory Visit (INDEPENDENT_AMBULATORY_CARE_PROVIDER_SITE_OTHER)
Admission: RE | Admit: 2023-10-01 | Discharge: 2023-10-01 | Disposition: A | Discharge status: Home / Self Care | Payer: Medicare Other | Source: Ambulatory Visit | Attending: Internal Medicine | Admitting: Internal Medicine

## 2023-10-01 ENCOUNTER — Other Ambulatory Visit: Payer: Medicare Other

## 2023-10-01 DIAGNOSIS — M810 Age-related osteoporosis without current pathological fracture: Secondary | ICD-10-CM

## 2023-10-02 DIAGNOSIS — M25562 Pain in left knee: Secondary | ICD-10-CM | POA: Diagnosis not present

## 2023-10-02 DIAGNOSIS — Z7409 Other reduced mobility: Secondary | ICD-10-CM | POA: Diagnosis not present

## 2023-10-02 DIAGNOSIS — Z96652 Presence of left artificial knee joint: Secondary | ICD-10-CM | POA: Diagnosis not present

## 2023-10-02 DIAGNOSIS — G8929 Other chronic pain: Secondary | ICD-10-CM | POA: Diagnosis not present

## 2023-10-02 DIAGNOSIS — M25462 Effusion, left knee: Secondary | ICD-10-CM | POA: Diagnosis not present

## 2023-10-02 DIAGNOSIS — R29898 Other symptoms and signs involving the musculoskeletal system: Secondary | ICD-10-CM | POA: Diagnosis not present

## 2023-10-02 DIAGNOSIS — M25662 Stiffness of left knee, not elsewhere classified: Secondary | ICD-10-CM | POA: Diagnosis not present

## 2023-10-02 DIAGNOSIS — M1712 Unilateral primary osteoarthritis, left knee: Secondary | ICD-10-CM | POA: Diagnosis not present

## 2023-10-07 DIAGNOSIS — M25662 Stiffness of left knee, not elsewhere classified: Secondary | ICD-10-CM | POA: Diagnosis not present

## 2023-10-07 DIAGNOSIS — M25462 Effusion, left knee: Secondary | ICD-10-CM | POA: Diagnosis not present

## 2023-10-07 DIAGNOSIS — M25562 Pain in left knee: Secondary | ICD-10-CM | POA: Diagnosis not present

## 2023-10-07 DIAGNOSIS — R29898 Other symptoms and signs involving the musculoskeletal system: Secondary | ICD-10-CM | POA: Diagnosis not present

## 2023-10-07 DIAGNOSIS — Z7409 Other reduced mobility: Secondary | ICD-10-CM | POA: Diagnosis not present

## 2023-10-07 DIAGNOSIS — Z96652 Presence of left artificial knee joint: Secondary | ICD-10-CM | POA: Diagnosis not present

## 2023-10-09 DIAGNOSIS — Z96652 Presence of left artificial knee joint: Secondary | ICD-10-CM | POA: Diagnosis not present

## 2023-10-11 ENCOUNTER — Other Ambulatory Visit: Payer: Self-pay | Admitting: Hematology & Oncology

## 2023-10-13 ENCOUNTER — Encounter: Payer: Self-pay | Admitting: Hematology & Oncology

## 2023-10-13 DIAGNOSIS — Z7409 Other reduced mobility: Secondary | ICD-10-CM | POA: Diagnosis not present

## 2023-10-13 DIAGNOSIS — R29898 Other symptoms and signs involving the musculoskeletal system: Secondary | ICD-10-CM | POA: Diagnosis not present

## 2023-10-13 DIAGNOSIS — M25462 Effusion, left knee: Secondary | ICD-10-CM | POA: Diagnosis not present

## 2023-10-13 DIAGNOSIS — M25662 Stiffness of left knee, not elsewhere classified: Secondary | ICD-10-CM | POA: Diagnosis not present

## 2023-10-13 DIAGNOSIS — M25562 Pain in left knee: Secondary | ICD-10-CM | POA: Diagnosis not present

## 2023-10-13 DIAGNOSIS — Z96652 Presence of left artificial knee joint: Secondary | ICD-10-CM | POA: Diagnosis not present

## 2023-10-14 ENCOUNTER — Other Ambulatory Visit: Payer: Self-pay | Admitting: Hematology & Oncology

## 2023-10-21 ENCOUNTER — Other Ambulatory Visit: Payer: Medicare Other

## 2023-10-21 ENCOUNTER — Ambulatory Visit: Payer: Medicare Other | Admitting: Hematology & Oncology

## 2023-10-21 DIAGNOSIS — M25462 Effusion, left knee: Secondary | ICD-10-CM | POA: Diagnosis not present

## 2023-10-21 DIAGNOSIS — Z96652 Presence of left artificial knee joint: Secondary | ICD-10-CM | POA: Diagnosis not present

## 2023-10-21 DIAGNOSIS — M25562 Pain in left knee: Secondary | ICD-10-CM | POA: Diagnosis not present

## 2023-10-21 DIAGNOSIS — R29898 Other symptoms and signs involving the musculoskeletal system: Secondary | ICD-10-CM | POA: Diagnosis not present

## 2023-10-21 DIAGNOSIS — Z7409 Other reduced mobility: Secondary | ICD-10-CM | POA: Diagnosis not present

## 2023-10-21 DIAGNOSIS — G8929 Other chronic pain: Secondary | ICD-10-CM | POA: Diagnosis not present

## 2023-10-21 DIAGNOSIS — M1712 Unilateral primary osteoarthritis, left knee: Secondary | ICD-10-CM | POA: Diagnosis not present

## 2023-10-21 DIAGNOSIS — M25662 Stiffness of left knee, not elsewhere classified: Secondary | ICD-10-CM | POA: Diagnosis not present

## 2023-10-24 ENCOUNTER — Other Ambulatory Visit: Payer: Self-pay | Admitting: Internal Medicine

## 2023-10-28 ENCOUNTER — Other Ambulatory Visit: Payer: Self-pay | Admitting: *Deleted

## 2023-10-28 DIAGNOSIS — M25562 Pain in left knee: Secondary | ICD-10-CM | POA: Diagnosis not present

## 2023-10-28 DIAGNOSIS — M25462 Effusion, left knee: Secondary | ICD-10-CM | POA: Diagnosis not present

## 2023-10-28 DIAGNOSIS — Z7409 Other reduced mobility: Secondary | ICD-10-CM | POA: Diagnosis not present

## 2023-10-28 DIAGNOSIS — M25662 Stiffness of left knee, not elsewhere classified: Secondary | ICD-10-CM | POA: Diagnosis not present

## 2023-10-28 DIAGNOSIS — R29898 Other symptoms and signs involving the musculoskeletal system: Secondary | ICD-10-CM | POA: Diagnosis not present

## 2023-10-28 DIAGNOSIS — I723 Aneurysm of iliac artery: Secondary | ICD-10-CM

## 2023-10-28 DIAGNOSIS — Z96652 Presence of left artificial knee joint: Secondary | ICD-10-CM | POA: Diagnosis not present

## 2023-10-31 ENCOUNTER — Inpatient Hospital Stay (HOSPITAL_BASED_OUTPATIENT_CLINIC_OR_DEPARTMENT_OTHER): Payer: Medicare Other | Admitting: Hematology & Oncology

## 2023-10-31 ENCOUNTER — Encounter: Payer: Self-pay | Admitting: Hematology & Oncology

## 2023-10-31 ENCOUNTER — Inpatient Hospital Stay: Payer: Medicare Other | Attending: Hematology & Oncology

## 2023-10-31 ENCOUNTER — Other Ambulatory Visit (HOSPITAL_COMMUNITY): Payer: Self-pay | Admitting: Hematology & Oncology

## 2023-10-31 ENCOUNTER — Telehealth: Payer: Self-pay | Admitting: *Deleted

## 2023-10-31 VITALS — BP 128/79 | HR 65 | Temp 98.0°F | Resp 18 | Ht 71.0 in | Wt 201.0 lb

## 2023-10-31 DIAGNOSIS — D509 Iron deficiency anemia, unspecified: Secondary | ICD-10-CM | POA: Insufficient documentation

## 2023-10-31 DIAGNOSIS — Z7901 Long term (current) use of anticoagulants: Secondary | ICD-10-CM | POA: Insufficient documentation

## 2023-10-31 DIAGNOSIS — I8001 Phlebitis and thrombophlebitis of superficial vessels of right lower extremity: Secondary | ICD-10-CM | POA: Diagnosis not present

## 2023-10-31 DIAGNOSIS — I82509 Chronic embolism and thrombosis of unspecified deep veins of unspecified lower extremity: Secondary | ICD-10-CM

## 2023-10-31 DIAGNOSIS — D6862 Lupus anticoagulant syndrome: Secondary | ICD-10-CM | POA: Diagnosis not present

## 2023-10-31 DIAGNOSIS — M79606 Pain in leg, unspecified: Secondary | ICD-10-CM

## 2023-10-31 DIAGNOSIS — Z79899 Other long term (current) drug therapy: Secondary | ICD-10-CM | POA: Diagnosis not present

## 2023-10-31 DIAGNOSIS — Z883 Allergy status to other anti-infective agents status: Secondary | ICD-10-CM | POA: Diagnosis not present

## 2023-10-31 DIAGNOSIS — D500 Iron deficiency anemia secondary to blood loss (chronic): Secondary | ICD-10-CM | POA: Diagnosis not present

## 2023-10-31 DIAGNOSIS — I82451 Acute embolism and thrombosis of right peroneal vein: Secondary | ICD-10-CM | POA: Insufficient documentation

## 2023-10-31 LAB — IRON AND IRON BINDING CAPACITY (CC-WL,HP ONLY)
Iron: 126 ug/dL (ref 45–182)
Saturation Ratios: 35 % (ref 17.9–39.5)
TIBC: 357 ug/dL (ref 250–450)
UIBC: 231 ug/dL (ref 117–376)

## 2023-10-31 LAB — CMP (CANCER CENTER ONLY)
ALT: 17 U/L (ref 0–44)
AST: 20 U/L (ref 15–41)
Albumin: 4.5 g/dL (ref 3.5–5.0)
Alkaline Phosphatase: 42 U/L (ref 38–126)
Anion gap: 6 (ref 5–15)
BUN: 20 mg/dL (ref 8–23)
CO2: 31 mmol/L (ref 22–32)
Calcium: 9.9 mg/dL (ref 8.9–10.3)
Chloride: 97 mmol/L — ABNORMAL LOW (ref 98–111)
Creatinine: 1.05 mg/dL (ref 0.61–1.24)
GFR, Estimated: >60 mL/min (ref >60)
Glucose, Bld: 123 mg/dL — ABNORMAL HIGH (ref 70–99)
Potassium: 4.6 mmol/L (ref 3.5–5.1)
Sodium: 134 mmol/L — ABNORMAL LOW (ref 135–145)
Total Bilirubin: 0.7 mg/dL (ref <1.2)
Total Protein: 8.2 g/dL — ABNORMAL HIGH (ref 6.5–8.1)

## 2023-10-31 LAB — CBC WITH DIFFERENTIAL (CANCER CENTER ONLY)
Abs Immature Granulocytes: 0.02 10*3/uL (ref 0.00–0.07)
Basophils Absolute: 0 10*3/uL (ref 0.0–0.1)
Basophils Relative: 1 %
Eosinophils Absolute: 0.3 10*3/uL (ref 0.0–0.5)
Eosinophils Relative: 4 %
HCT: 45.6 % (ref 39.0–52.0)
Hemoglobin: 15 g/dL (ref 13.0–17.0)
Immature Granulocytes: 0 %
Lymphocytes Relative: 36 %
Lymphs Abs: 2.8 10*3/uL (ref 0.7–4.0)
MCH: 31.3 pg (ref 26.0–34.0)
MCHC: 32.9 g/dL (ref 30.0–36.0)
MCV: 95 fL (ref 80.0–100.0)
Monocytes Absolute: 0.7 10*3/uL (ref 0.1–1.0)
Monocytes Relative: 9 %
Neutro Abs: 3.9 10*3/uL (ref 1.7–7.7)
Neutrophils Relative %: 50 %
Platelet Count: 241 10*3/uL (ref 150–400)
RBC: 4.8 MIL/uL (ref 4.22–5.81)
RDW: 13.7 % (ref 11.5–15.5)
WBC Count: 7.7 10*3/uL (ref 4.0–10.5)
nRBC: 0 % (ref 0.0–0.2)

## 2023-10-31 LAB — FERRITIN: Ferritin: 52 ng/mL (ref 24–336)

## 2023-10-31 NOTE — Progress Notes (Signed)
Hematology and Oncology Follow Up Visit  Roger Kent [id number] 1946-01-23 77 y.o. 10/31/2023   Principle Diagnosis:  DVT of the right peroneal vein SVT of the right saphenous vein (+) lupus anti-coagulant Iron deficiency anemia  Current Therapy:   Xarelto 20 mg po q day -- 1 yr of therapeutic to complete in 05/2019 Xarelto 10 mg po q day -- maintenance x 17yr -- start 05/2019 -- completed on 06/2020 EC ASA 81 mg po q day -- started on 01/11/2019 -- changed to 325 mg po q day on 06/05/2020 IV iron-Monoferric given on 05/03/2022     Interim History:  Roger Kent is back for follow-up.  He did have his left knee surgery.  This was done on October 7.  He did quite well with this.  He had a grueling physical therapy.  He is really made some nice progress.  He has good range of motion of his left knee.  There is a bit of swelling in the left leg.  I am not surprised by this given his surgery.  We did have him on perioperative Xarelto.  I this really did help.  Now he is on full dose aspirin.  He has had no cough or chest wall pain.  There has been no change in bowel or bladder habits.  He has had no rashes.  There has been no fever.  He has had no headache.  It sounds like he and his wife will be going on a cruise next year [place].  He has had no fever.  He has had no nausea or vomiting.  Overall, I would say his performance status is probably ECOG 1.     Medications:  Current Outpatient Medications:    Apoaequorin (PREVAGEN PO), Take 1 capsule by mouth daily. Pro, Disp: , Rfl:    Bromfenac Sodium 0.07 % SOLN, Place 1 drop into both eyes at bedtime., Disp: , Rfl:    busPIRone (BUSPAR) 7.5 MG tablet, TAKE ONE TABLET BY MOUTH TWICE DAILY, Disp: 60 tablet, Rfl: 5   cetirizine (ZYRTEC) 10 MG tablet, Take 10 mg by mouth daily., Disp: , Rfl:    Coenzyme Q10 (CO Q 10 PO), Take 300 mg by mouth daily., Disp: , Rfl:    COLLAGEN PO, Take 1 capsule by mouth daily.  collagen hydrolyzed, Disp: , Rfl:    Dextromethorphan-guaiFENesin (MUCINEX DM MAXIMUM STRENGTH) 60-1200 MG TB12, Take 1 tablet by mouth daily., Disp: , Rfl:    esomeprazole (NEXIUM) 20 MG capsule, Take 1 capsule (20 mg total) by mouth daily. 30 minutes to an hour before meals, Disp: 90 capsule, Rfl: 3   Evolocumab (REPATHA SURECLICK) 140 MG/ML SOAJ, INJECT 140MG  INTO THE SKIN ONCE EVERY 14 DAYS, Disp: 6 mL, Rfl: 3   ferrous sulfate 325 (65 FE) MG tablet, Take 1 tablet (325 mg total) by mouth daily with breakfast. (Patient taking differently: Take 325 mg by mouth daily.), Disp: 30 tablet, Rfl: 2   Glucosamine HCl (GLUCOSAMINE PO), Take 3,000 mg by mouth daily. MSM 1500 mg  1500 mg, Disp: , Rfl:    MAGNESIUM GLUCONATE PO, Take 120 mg by mouth daily., Disp: , Rfl:    methocarbamol (ROBAXIN) 500 MG tablet, Take 1-2 tablets (500-1,000 mg total) by mouth every 6 (six) hours as needed for muscle spasms., Disp: 60 tablet, Rfl: 0   olmesartan (BENICAR) 40 MG tablet, TAKE 1 TABLET DAILY, Disp: 90 tablet, Rfl: 3   OVER THE COUNTER MEDICATION, Take 1 Package  by mouth daily. Relief factor  4 tablet each package, Disp: , Rfl:    OVER THE COUNTER MEDICATION, Take 2 tablets by mouth daily. Better Bladder 840 mg, Disp: , Rfl:    OVER THE COUNTER MEDICATION, Take 1 tablet by mouth daily. Nitricoxide Niacin, Disp: , Rfl:    OVER THE COUNTER MEDICATION, Take 6 tablets by mouth daily. Super fruit 3 each Super veggie 3 each, Disp: , Rfl:    OVER THE COUNTER MEDICATION, Take 1 Scoop by mouth daily. Orgain organic protein powder Adds/ vital protein collagen peptides 2-4 table spoons, Disp: , Rfl:    OVER THE COUNTER MEDICATION, Take 2 tablets by mouth daily. Super beets chews, Disp: , Rfl:    OVER THE COUNTER MEDICATION, Place 1 application  into both nostrils daily. navage nasal care, Disp: , Rfl:    Probiotic Product (PROBIOTIC PO), Take 1 capsule by mouth daily. 60 billion, Disp: , Rfl:    simethicone (MYLICON) 125 MG  chewable tablet, Chew 125 mg by mouth every 6 (six) hours as needed for flatulence., Disp: , Rfl:    Sodium Hyaluronate, oral, (HYALURONIC ACID PO), Take 2,000 mg by mouth daily. 1000 mg each, Disp: , Rfl:   Allergies:  Allergies  Allergen Reactions   Oxycodone-Acetaminophen Other (See Comments)    Other Reaction: agitation   Crestor [Rosuvastatin] Other (See Comments)    Muscle pain   Zetia [Ezetimibe]     Brain fog, felt sluggish   Lamisil [Terbinafine] Rash    Past Medical History, Surgical history, Social history, and Family History were reviewed and updated.  Review of Systems: Review of Systems  Constitutional: Negative.   HENT:  Negative.    Eyes: Negative.   Respiratory: Negative.    Cardiovascular: Negative.   Gastrointestinal: Negative.   Endocrine: Negative.   Genitourinary: Negative.    Musculoskeletal: Negative.   Skin: Negative.   Neurological: Negative.   Hematological: Negative.   Psychiatric/Behavioral: Negative.      Physical Exam:  height is 5\' 11"  (1.803 m) and weight is 201 lb (91.2 kg). His oral temperature is 98 F (36.7 C). His blood pressure is 128/79 and his pulse is 65. His respiration is 18 and oxygen saturation is 98%.   Wt Readings from Last 3 Encounters:  10/31/23 201 lb (91.2 kg)  08/25/23 185 lb (83.9 kg)  08/14/23 185 lb (83.9 kg)    Physical Exam Vitals reviewed.  HENT:     Head: Normocephalic and atraumatic.  Eyes:     Pupils: Pupils are equal, round, and reactive to light.  Cardiovascular:     Rate and Rhythm: Normal rate and regular rhythm.     Heart sounds: Normal heart sounds.  Pulmonary:     Effort: Pulmonary effort is normal.     Breath sounds: Normal breath sounds.  Abdominal:     General: Bowel sounds are normal.     Palpations: Abdomen is soft.  Genitourinary:    Comments: Rectal exam showed no external hemorrhoids.  He had no mass in the rectal vault.  His stool was brown and heme negative. Musculoskeletal:         General: No tenderness or deformity. Normal range of motion.     Cervical back: Normal range of motion.  Lymphadenopathy:     Cervical: No cervical adenopathy.  Skin:    General: Skin is warm and dry.     Findings: No erythema or rash.  Neurological:     Mental Status: He  is alert and oriented to person, place, and time.  Psychiatric:        Behavior: Behavior normal.        Thought Content: Thought content normal.        Judgment: Judgment normal.      Lab Results  Component Value Date   WBC 7.7 10/31/2023   HGB 15.0 10/31/2023   HCT 45.6 10/31/2023   MCV 95.0 10/31/2023   PLT 241 10/31/2023     Chemistry      Component Value Date/Time   NA 134 (L) 10/31/2023 0832   NA 140 05/10/2021 0842   K 4.6 10/31/2023 0832   CL 97 (L) 10/31/2023 0832   CO2 31 10/31/2023 0832   BUN 20 10/31/2023 0832   BUN 15 05/10/2021 0842   CREATININE 1.05 10/31/2023 0832   CREATININE 0.98 08/01/2020 0924      Component Value Date/Time   CALCIUM 9.9 10/31/2023 0832   ALKPHOS 42 10/31/2023 0832   AST 20 10/31/2023 0832   ALT 17 10/31/2023 0832   BILITOT 0.7 10/31/2023 0832       Impression and Plan: Roger Kent is a 77 year old white male.  He had a thrombus in the left leg about 6 years ago.  He got through his knee surgery.  Again, surprised by the leg swelling in the left leg.  This is typical after the surgery.  He says that his cardiologist wants to have Dopplers of his lower legs.  We will set this up for him in a month.  I will plan to see him back in 4 months.  I think this would be reasonable.  I would like to see him back before he and his wife go on their big [place] cruise.   Josph Macho, MD 12/13/20249:06 AM

## 2023-10-31 NOTE — Telephone Encounter (Signed)
-----   Message from Josph Macho sent at 10/31/2023  3:52 PM EST ----- Please call and let him know that the iron level is okay.  Thanks.  Cindee Lame

## 2023-10-31 NOTE — Telephone Encounter (Signed)
Mychart msg to pt

## 2023-11-03 DIAGNOSIS — T1590XA Foreign body on external eye, part unspecified, unspecified eye, initial encounter: Secondary | ICD-10-CM | POA: Diagnosis not present

## 2023-11-03 DIAGNOSIS — H40013 Open angle with borderline findings, low risk, bilateral: Secondary | ICD-10-CM | POA: Diagnosis not present

## 2023-11-03 DIAGNOSIS — H1820 Unspecified corneal edema: Secondary | ICD-10-CM | POA: Diagnosis not present

## 2023-11-03 DIAGNOSIS — H35372 Puckering of macula, left eye: Secondary | ICD-10-CM | POA: Diagnosis not present

## 2023-11-03 DIAGNOSIS — H31093 Other chorioretinal scars, bilateral: Secondary | ICD-10-CM | POA: Diagnosis not present

## 2023-11-05 DIAGNOSIS — R29898 Other symptoms and signs involving the musculoskeletal system: Secondary | ICD-10-CM | POA: Diagnosis not present

## 2023-11-05 DIAGNOSIS — M25662 Stiffness of left knee, not elsewhere classified: Secondary | ICD-10-CM | POA: Diagnosis not present

## 2023-11-05 DIAGNOSIS — M25462 Effusion, left knee: Secondary | ICD-10-CM | POA: Diagnosis not present

## 2023-11-05 DIAGNOSIS — Z7409 Other reduced mobility: Secondary | ICD-10-CM | POA: Diagnosis not present

## 2023-11-05 DIAGNOSIS — M25562 Pain in left knee: Secondary | ICD-10-CM | POA: Diagnosis not present

## 2023-11-05 DIAGNOSIS — Z96652 Presence of left artificial knee joint: Secondary | ICD-10-CM | POA: Diagnosis not present
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
## 2023-11-17 NOTE — Progress Notes (Shared)
[facility] - Clinic Note  11/25/2023   CHIEF COMPLAINT Patient presents for No chief complaint on file.  HISTORY OF PRESENT ILLNESS: Roger Kent is a 77 y.o. male who presents to the clinic today for:   Referring physician: Plotnikov, Georgina Quint, MD [address] [place],  [place] [address]  HISTORICAL INFORMATION:  Selected notes from the MEDICAL RECORD NUMBER Referred by Dr. Nedra Hai:  Ocular Hx- PMH-   CURRENT MEDICATIONS: Current Outpatient Medications (Ophthalmic Drugs)  Medication Sig   Bromfenac Sodium 0.07 % SOLN Place 1 drop into both eyes at bedtime.   No current facility-administered medications for this visit. (Ophthalmic Drugs)   Current Outpatient Medications (Other)  Medication Sig   Apoaequorin (PREVAGEN PO) Take 1 capsule by mouth daily. Pro   busPIRone (BUSPAR) 7.5 MG tablet TAKE ONE TABLET BY MOUTH TWICE DAILY   cetirizine (ZYRTEC) 10 MG tablet Take 10 mg by mouth daily.   Coenzyme Q10 (CO Q 10 PO) Take 300 mg by mouth daily.   COLLAGEN PO Take 1 capsule by mouth daily. collagen hydrolyzed   Dextromethorphan-guaiFENesin (MUCINEX DM MAXIMUM STRENGTH) 60-1200 MG TB12 Take 1 tablet by mouth daily.   esomeprazole (NEXIUM) 20 MG capsule Take 1 capsule (20 mg total) by mouth daily. 30 minutes to an hour before meals   Evolocumab (REPATHA SURECLICK) 140 MG/ML SOAJ INJECT 140MG  INTO THE SKIN ONCE EVERY 14 DAYS   ferrous sulfate 325 (65 FE) MG tablet Take 1 tablet (325 mg total) by mouth daily with breakfast. (Patient taking differently: Take 325 mg by mouth daily.)   Glucosamine HCl (GLUCOSAMINE PO) Take 3,000 mg by mouth daily. MSM 1500 mg  1500 mg   MAGNESIUM GLUCONATE PO Take 120 mg by mouth daily.   methocarbamol (ROBAXIN) 500 MG tablet Take 1-2 tablets (500-1,000 mg total) by mouth every 6 (six) hours as needed for muscle spasms.   olmesartan (BENICAR) 40 MG tablet TAKE 1 TABLET DAILY   OVER THE COUNTER MEDICATION Take 1 Package by  mouth daily. Relief factor  4 tablet each package   OVER THE COUNTER MEDICATION Take 2 tablets by mouth daily. Better Bladder 840 mg   OVER THE COUNTER MEDICATION Take 1 tablet by mouth daily. Nitricoxide Niacin   OVER THE COUNTER MEDICATION Take 6 tablets by mouth daily. Super fruit 3 each Super veggie 3 each   OVER THE COUNTER MEDICATION Take 1 Scoop by mouth daily. Orgain organic protein powder Adds/ vital protein collagen peptides 2-4 table spoons   OVER THE COUNTER MEDICATION Take 2 tablets by mouth daily. Super beets chews   OVER THE COUNTER MEDICATION Place 1 application  into both nostrils daily. navage nasal care   Probiotic Product (PROBIOTIC PO) Take 1 capsule by mouth daily. 60 billion   simethicone (MYLICON) 125 MG chewable tablet Chew 125 mg by mouth every 6 (six) hours as needed for flatulence.   Sodium Hyaluronate, oral, (HYALURONIC ACID PO) Take 2,000 mg by mouth daily. 1000 mg each   No current facility-administered medications for this visit. (Other)   REVIEW OF SYSTEMS:  ALLERGIES Allergies  Allergen Reactions   Oxycodone-Acetaminophen Other (See Comments)    Other Reaction: agitation   Crestor [Rosuvastatin] Other (See Comments)    Muscle pain   Zetia [Ezetimibe]     Brain fog, felt sluggish   Lamisil [Terbinafine] Rash   PAST MEDICAL HISTORY Past Medical History:  Diagnosis Date   Acute hepatitis C without mention of hepatic coma(070.51)  Allergic rhinitis, cause unspecified    Anxiety state, unspecified    Arthritis    Barrett's esophagus    BPH (benign prostatic hyperplasia)    Cancer (HCC)    skin: face,scalp   DVT (deep venous thrombosis) (HCC)    Elevated PSA    Dr Vernie Ammons   Esophageal reflux    Heart murmur    Hepatitis C    Hiatal hernia    Hyperlipidemia    Hypertension    Hypertonicity of bladder    Insomnia    Iron deficiency anemia 04/30/2022   Iron malabsorption 04/30/2022   Leg DVT (deep venous thromboembolism), chronic,  unspecified laterality (HCC) 09/21/2018   Osteoporosis, unspecified    Peripheral vascular disease (HCC)    Personal history of urinary calculi    Recent retinal detachment, partial, with giant tear    Unspecified tinnitus    Ventricular septal defect    Past Surgical History:  Procedure Laterality Date   COLONOSCOPY     KNEE SURGERY Bilateral    RETINAL DETACHMENT SURGERY     TOE SURGERY Left    great toe   TOTAL KNEE ARTHROPLASTY Left 08/25/2023   Procedure: TOTAL KNEE ARTHROPLASTY;  Surgeon: Dannielle Huh, MD;  Location: [facility];  Service: Orthopedics;  Laterality: Left;   UPPER GI ENDOSCOPY     FAMILY HISTORY Family History  Problem Relation Age of Onset   Ovarian cancer Mother    Deep vein thrombosis Mother    Hypertension Other    Alcohol abuse Neg Hx    Colon cancer Neg Hx    Stomach cancer Neg Hx    Esophageal cancer Neg Hx    Colon polyps Neg Hx    SOCIAL HISTORY Social History   Tobacco Use   Smoking status: Former    Current packs/day: 0.00    Types: Cigarettes    Quit date: 1976    Years since quitting: 49.0    Passive exposure: Past   Smokeless tobacco: Never  Vaping Use   Vaping status: Never Used  Substance Use Topics   Alcohol use: Not Currently   Drug use: No       OPHTHALMIC EXAM:  Not recorded    IMAGING AND PROCEDURES  Imaging and Procedures for 11/25/2023        ASSESSMENT/PLAN: No diagnosis found. 1.  2.  3.  Ophthalmic Meds Ordered this visit:  No orders of the defined types were placed in this encounter.    No follow-ups on file.  There are no Patient Instructions on file for this visit.  Explained the diagnoses, plan, and follow up with the patient and they expressed understanding.  Patient expressed understanding of the importance of proper follow up care.  This document serves as a record of services personally performed by Karie Chimera, MD, PhD. It was created on their behalf by Charlette Caffey, COT an  ophthalmic technician. The creation of this record is the provider's dictation and/or activities during the visit.    Electronically signed by:  Charlette Caffey, COT  11/17/23 8:30 AM   Karie Chimera, M.D., Ph.D. Diseases & Surgery of the Retina and Vitreous [facility] 11/25/2023  Abbreviations: M myopia (nearsighted); A astigmatism; H hyperopia (farsighted); P presbyopia; Mrx spectacle prescription;  CTL contact lenses; OD right eye; OS left eye; OU both eyes  XT exotropia; ET esotropia; PEK punctate epithelial keratitis; PEE punctate epithelial erosions; DES dry eye syndrome; MGD meibomian gland  dysfunction; ATs artificial tears; PFAT's preservative free artificial tears; NSC nuclear sclerotic cataract; PSC posterior subcapsular cataract; ERM epi-retinal membrane; PVD posterior vitreous detachment; RD retinal detachment; DM diabetes mellitus; DR diabetic retinopathy; NPDR non-proliferative diabetic retinopathy; PDR proliferative diabetic retinopathy; CSME clinically significant macular edema; DME diabetic macular edema; dbh dot blot hemorrhages; CWS cotton wool spot; POAG primary open angle glaucoma; C/D cup-to-disc ratio; HVF humphrey visual field; GVF goldmann visual field; OCT optical coherence tomography; IOP intraocular pressure; BRVO Branch retinal vein occlusion; CRVO central retinal vein occlusion; CRAO central retinal artery occlusion; BRAO branch retinal artery occlusion; RT retinal tear; SB scleral buckle; PPV pars plana vitrectomy; VH Vitreous hemorrhage; PRP panretinal laser photocoagulation; IVK intravitreal kenalog; VMT vitreomacular traction; MH Macular hole;  NVD neovascularization of the disc; NVE neovascularization elsewhere; AREDS age related eye disease study; ARMD age related macular degeneration; POAG primary open angle glaucoma; EBMD epithelial/anterior basement membrane dystrophy; ACIOL anterior chamber intraocular lens; IOL intraocular lens; PCIOL  posterior chamber intraocular lens; Phaco/IOL phacoemulsification with intraocular lens placement; PRK photorefractive keratectomy; LASIK laser assisted in situ keratomileusis; HTN hypertension; DM diabetes mellitus; COPD chronic obstructive pulmonary disease

## 2023-11-18 ENCOUNTER — Other Ambulatory Visit: Payer: Self-pay | Admitting: Internal Medicine

## 2023-11-21 ENCOUNTER — Encounter: Payer: Self-pay | Admitting: Hematology & Oncology

## 2023-11-24 ENCOUNTER — Ambulatory Visit: Payer: Medicare Other | Attending: Rheumatology | Admitting: Rheumatology

## 2023-11-24 ENCOUNTER — Encounter: Payer: Self-pay | Admitting: Rheumatology

## 2023-11-24 VITALS — BP 142/83 | HR 71 | Resp 14 | Ht 72.0 in | Wt 203.0 lb

## 2023-11-24 DIAGNOSIS — K2270 Barrett's esophagus without dysplasia: Secondary | ICD-10-CM | POA: Diagnosis not present

## 2023-11-24 DIAGNOSIS — Z8619 Personal history of other infectious and parasitic diseases: Secondary | ICD-10-CM | POA: Diagnosis not present

## 2023-11-24 DIAGNOSIS — M810 Age-related osteoporosis without current pathological fracture: Secondary | ICD-10-CM | POA: Diagnosis not present

## 2023-11-24 DIAGNOSIS — R350 Frequency of micturition: Secondary | ICD-10-CM | POA: Diagnosis present

## 2023-11-24 DIAGNOSIS — M4156 Other secondary scoliosis, lumbar region: Secondary | ICD-10-CM

## 2023-11-24 DIAGNOSIS — N401 Enlarged prostate with lower urinary tract symptoms: Secondary | ICD-10-CM | POA: Diagnosis present

## 2023-11-24 DIAGNOSIS — E785 Hyperlipidemia, unspecified: Secondary | ICD-10-CM

## 2023-11-24 DIAGNOSIS — M19071 Primary osteoarthritis, right ankle and foot: Secondary | ICD-10-CM

## 2023-11-24 DIAGNOSIS — I2583 Coronary atherosclerosis due to lipid rich plaque: Secondary | ICD-10-CM | POA: Diagnosis not present

## 2023-11-24 DIAGNOSIS — I2510 Atherosclerotic heart disease of native coronary artery without angina pectoris: Secondary | ICD-10-CM

## 2023-11-24 DIAGNOSIS — N200 Calculus of kidney: Secondary | ICD-10-CM

## 2023-11-24 DIAGNOSIS — Q210 Ventricular septal defect: Secondary | ICD-10-CM | POA: Diagnosis not present

## 2023-11-24 DIAGNOSIS — M19072 Primary osteoarthritis, left ankle and foot: Secondary | ICD-10-CM | POA: Insufficient documentation

## 2023-11-24 DIAGNOSIS — M51369 Other intervertebral disc degeneration, lumbar region without mention of lumbar back pain or lower extremity pain: Secondary | ICD-10-CM

## 2023-11-24 DIAGNOSIS — H1820 Unspecified corneal edema: Secondary | ICD-10-CM | POA: Diagnosis not present

## 2023-11-24 DIAGNOSIS — M1711 Unilateral primary osteoarthritis, right knee: Secondary | ICD-10-CM

## 2023-11-24 DIAGNOSIS — Z86718 Personal history of other venous thrombosis and embolism: Secondary | ICD-10-CM | POA: Diagnosis not present

## 2023-11-24 DIAGNOSIS — M170 Bilateral primary osteoarthritis of knee: Secondary | ICD-10-CM

## 2023-11-24 DIAGNOSIS — N183 Chronic kidney disease, stage 3 unspecified: Secondary | ICD-10-CM | POA: Diagnosis not present

## 2023-11-24 DIAGNOSIS — I10 Essential (primary) hypertension: Secondary | ICD-10-CM

## 2023-11-24 DIAGNOSIS — M542 Cervicalgia: Secondary | ICD-10-CM | POA: Diagnosis not present

## 2023-11-24 DIAGNOSIS — G629 Polyneuropathy, unspecified: Secondary | ICD-10-CM

## 2023-11-24 DIAGNOSIS — M25512 Pain in left shoulder: Secondary | ICD-10-CM | POA: Diagnosis not present

## 2023-11-24 DIAGNOSIS — Z96652 Presence of left artificial knee joint: Secondary | ICD-10-CM

## 2023-11-24 DIAGNOSIS — H04123 Dry eye syndrome of bilateral lacrimal glands: Secondary | ICD-10-CM | POA: Diagnosis not present

## 2023-11-24 DIAGNOSIS — Z8719 Personal history of other diseases of the digestive system: Secondary | ICD-10-CM | POA: Diagnosis not present

## 2023-11-24 DIAGNOSIS — M19041 Primary osteoarthritis, right hand: Secondary | ICD-10-CM | POA: Diagnosis not present

## 2023-11-24 DIAGNOSIS — G8929 Other chronic pain: Secondary | ICD-10-CM

## 2023-11-24 DIAGNOSIS — M25552 Pain in left hip: Secondary | ICD-10-CM

## 2023-11-24 DIAGNOSIS — M19042 Primary osteoarthritis, left hand: Secondary | ICD-10-CM | POA: Diagnosis not present

## 2023-11-24 DIAGNOSIS — T1590XA Foreign body on external eye, part unspecified, unspecified eye, initial encounter: Secondary | ICD-10-CM | POA: Diagnosis not present

## 2023-11-25 ENCOUNTER — Encounter (INDEPENDENT_AMBULATORY_CARE_PROVIDER_SITE_OTHER): Payer: Medicare Other | Admitting: Ophthalmology

## 2023-11-25 ENCOUNTER — Encounter (INDEPENDENT_AMBULATORY_CARE_PROVIDER_SITE_OTHER): Payer: Self-pay

## 2023-11-25 DIAGNOSIS — H338 Other retinal detachments: Secondary | ICD-10-CM

## 2023-11-25 DIAGNOSIS — I10 Essential (primary) hypertension: Secondary | ICD-10-CM | POA: Diagnosis not present

## 2023-11-25 DIAGNOSIS — H35033 Hypertensive retinopathy, bilateral: Secondary | ICD-10-CM | POA: Diagnosis not present

## 2023-11-25 DIAGNOSIS — H3581 Retinal edema: Secondary | ICD-10-CM

## 2023-11-25 DIAGNOSIS — H43813 Vitreous degeneration, bilateral: Secondary | ICD-10-CM

## 2023-11-25 DIAGNOSIS — H59033 Cystoid macular edema following cataract surgery, bilateral: Secondary | ICD-10-CM

## 2023-11-27 DIAGNOSIS — Z96652 Presence of left artificial knee joint: Secondary | ICD-10-CM | POA: Diagnosis not present

## 2023-11-27 DIAGNOSIS — R300 Dysuria: Secondary | ICD-10-CM | POA: Diagnosis not present

## 2023-11-28 ENCOUNTER — Encounter: Payer: Self-pay | Admitting: *Deleted

## 2023-11-28 ENCOUNTER — Ambulatory Visit (HOSPITAL_COMMUNITY)
Admission: RE | Admit: 2023-11-28 | Discharge: 2023-11-28 | Disposition: A | Discharge status: Home / Self Care | Payer: Medicare Other | Source: Ambulatory Visit | Attending: Cardiology | Admitting: Cardiology

## 2023-11-28 ENCOUNTER — Ambulatory Visit (INDEPENDENT_AMBULATORY_CARE_PROVIDER_SITE_OTHER)
Admission: RE | Admit: 2023-11-28 | Discharge: 2023-11-28 | Disposition: A | Discharge status: Home / Self Care | Payer: Medicare Other | Source: Ambulatory Visit | Attending: Cardiology | Admitting: Cardiology

## 2023-11-28 DIAGNOSIS — M7989 Other specified soft tissue disorders: Secondary | ICD-10-CM | POA: Diagnosis not present

## 2023-11-28 DIAGNOSIS — M79606 Pain in leg, unspecified: Secondary | ICD-10-CM | POA: Diagnosis not present

## 2023-11-28 DIAGNOSIS — I723 Aneurysm of iliac artery: Secondary | ICD-10-CM | POA: Diagnosis not present

## 2023-12-02 DIAGNOSIS — Z23 Encounter for immunization: Secondary | ICD-10-CM | POA: Diagnosis not present

## 2023-12-02 DIAGNOSIS — R799 Abnormal finding of blood chemistry, unspecified: Secondary | ICD-10-CM | POA: Diagnosis not present

## 2023-12-17 DIAGNOSIS — L821 Other seborrheic keratosis: Secondary | ICD-10-CM | POA: Diagnosis not present

## 2023-12-17 DIAGNOSIS — R229 Localized swelling, mass and lump, unspecified: Secondary | ICD-10-CM | POA: Diagnosis not present

## 2023-12-17 DIAGNOSIS — Z08 Encounter for follow-up examination after completed treatment for malignant neoplasm: Secondary | ICD-10-CM | POA: Diagnosis not present

## 2023-12-17 DIAGNOSIS — Z85828 Personal history of other malignant neoplasm of skin: Secondary | ICD-10-CM | POA: Diagnosis not present

## 2023-12-17 DIAGNOSIS — D229 Melanocytic nevi, unspecified: Secondary | ICD-10-CM | POA: Diagnosis not present

## 2023-12-17 DIAGNOSIS — Z872 Personal history of diseases of the skin and subcutaneous tissue: Secondary | ICD-10-CM | POA: Diagnosis not present

## 2024-01-12 ENCOUNTER — Ambulatory Visit (INDEPENDENT_AMBULATORY_CARE_PROVIDER_SITE_OTHER): Payer: Medicare Other

## 2024-01-12 VITALS — BP 132/80 | HR 65 | Ht 72.0 in | Wt 202.8 lb

## 2024-01-12 DIAGNOSIS — Z0000 Encounter for general adult medical examination without abnormal findings: Secondary | ICD-10-CM | POA: Diagnosis not present

## 2024-01-12 NOTE — Patient Instructions (Addendum)
 Mr. Enberg , Thank you for taking time to come for your Medicare Wellness Visit. I appreciate your ongoing commitment to your health goals. Please review the following plan we discussed and let me know if I can assist you in the future.   Referrals/Orders/Follow-Ups/Clinician Recommendations: Aim for 30 minutes of exercise or brisk walking, 6-8 glasses of water, and 5 servings of fruits and vegetables each day. Advised to get the Tdap and COVID vaccines in 2025 at local pharmacy.  This is a list of the screening recommended for you and due dates:  Health Maintenance  Topic Date Due   DTaP/Tdap/Td vaccine (3 - Td or Tdap) 07/04/2023   COVID-19 Vaccine (5 - 2024-25 season) 07/20/2023   Medicare Annual Wellness Visit  01/11/2025   Pneumonia Vaccine  Completed   Flu Shot  Completed   Hepatitis C Screening  Completed   Zoster (Shingles) Vaccine  Completed   HPV Vaccine  Aged Out   Colon Cancer Screening  Discontinued    Advanced directives: (Provided) Advance directive discussed with you today. I have provided a copy for you to complete at home and have notarized. Once this is complete, please bring a copy in to our office so we can scan it into your chart.   Next Medicare Annual Wellness Visit scheduled for next year: Yes - 2026

## 2024-01-12 NOTE — Progress Notes (Signed)
 Subjective:  Please attest and cosign this visit due to patients primary care provider not being in the office at the time the visit was completed.  (Pt of Dr A. Plotnikov).   Roger Kent is a 78 y.o. who presents for a Medicare Wellness preventive visit.  Visit Complete: In person  VideoDeclined- This patient declined Interactive audio and Acupuncturist. Therefore the visit was completed with audio only.  AWV Questionnaire: Yes: Patient Medicare AWV questionnaire was completed by the patient on 01/08/2024; I have confirmed that all information answered by patient is correct and no changes since this date.  Cardiac Risk Factors include: male gender;advanced age (>61men, >58 women);dyslipidemia;hypertension     Objective:    Today's Vitals   01/12/24 1302 01/12/24 1304  BP: 132/80   Pulse: 65   SpO2: 95%   Weight: 202 lb 12.8 oz (92 kg)   Height: 6' (1.829 m)   PainSc:  2    Body mass index is 27.5 kg/m.     01/12/2024   12:58 PM 10/31/2023    8:51 AM 08/25/2023    1:40 PM 08/14/2023   10:36 AM 08/12/2023   11:48 AM 03/11/2023   10:46 AM 01/10/2023    3:48 PM  Advanced Directives  Does Patient Have a Medical Advance Directive? No Yes Yes Yes Yes Yes Yes  Type of Advance Directive  Living will;Healthcare Power of [employer] Power of [place];Living will Living will;Healthcare Power of [employer] Power of [place];Living will Healthcare Power of [place];Living will Healthcare Power of [place];Living will  Does patient want to make changes to medical advance directive?  No - Patient declined No - Patient declined      Copy of Healthcare Power of Attorney in Chart?  No - copy requested No - copy requested  No - copy requested No - copy requested No - copy requested  Would patient like information on creating a medical advance directive? Yes (MAU/Ambulatory/Procedural Areas - Information given)     No - Patient declined     Current  Medications (verified) Outpatient Encounter Medications as of 01/12/2024  Medication Sig   Apoaequorin (PREVAGEN PO) Take 1 capsule by mouth daily. Pro   Bromfenac Sodium 0.07 % SOLN Place 1 drop into both eyes at bedtime.   busPIRone (BUSPAR) 7.5 MG tablet TAKE ONE TABLET BY MOUTH TWICE DAILY   cetirizine (ZYRTEC) 10 MG tablet Take 10 mg by mouth daily.   Coenzyme Q10 (CO Q 10 PO) Take 300 mg by mouth daily.   COLLAGEN PO Take 1 capsule by mouth daily. collagen hydrolyzed   Dextromethorphan-guaiFENesin (MUCINEX DM MAXIMUM STRENGTH) 60-1200 MG TB12 Take 1 tablet by mouth daily.   esomeprazole (NEXIUM) 20 MG capsule Take 1 capsule (20 mg total) by mouth daily. 30 minutes to an hour before meals   Evolocumab (REPATHA SURECLICK) 140 MG/ML SOAJ INJECT 140MG  INTO THE SKIN ONCE EVERY 14 DAYS   Glucosamine HCl (GLUCOSAMINE PO) Take 3,000 mg by mouth daily. MSM 1500 mg  1500 mg   MAGNESIUM GLUCONATE PO Take 120 mg by mouth daily.   methocarbamol (ROBAXIN) 500 MG tablet Take 1-2 tablets (500-1,000 mg total) by mouth every 6 (six) hours as needed for muscle spasms.   olmesartan (BENICAR) 40 MG tablet TAKE 1 TABLET DAILY   OVER THE COUNTER MEDICATION Take 1 Package by mouth daily. Relief factor  4 tablet each package   OVER THE COUNTER MEDICATION Take 2 tablets by mouth daily. Better Bladder 840 mg  OVER THE COUNTER MEDICATION Take 1 tablet by mouth daily. Nitricoxide Niacin   OVER THE COUNTER MEDICATION Take 6 tablets by mouth daily. Super fruit 3 each Super veggie 3 each   OVER THE COUNTER MEDICATION Take 1 Scoop by mouth daily. Orgain organic protein powder Adds/ vital protein collagen peptides 2-4 table spoons   OVER THE COUNTER MEDICATION Take 2 tablets by mouth daily. Super beets chews   OVER THE COUNTER MEDICATION Place 1 application  into both nostrils daily. navage nasal care   PRED FORTE 1 % ophthalmic suspension Place 1 drop into the right eye 2 (two) times daily.   Probiotic Product  (PROBIOTIC PO) Take 1 capsule by mouth daily. 60 billion   simethicone (MYLICON) 125 MG chewable tablet Chew 125 mg by mouth every 6 (six) hours as needed for flatulence.   Sodium Hyaluronate, oral, (HYALURONIC ACID PO) Take 2,000 mg by mouth daily. 1000 mg each   [DISCONTINUED] ferrous sulfate 325 (65 FE) MG tablet Take 1 tablet (325 mg total) by mouth daily with breakfast.   No facility-administered encounter medications on file as of 01/12/2024.    Allergies (verified) Oxycodone-acetaminophen, Crestor [rosuvastatin], Zetia [ezetimibe], and Lamisil [terbinafine]   History: Past Medical History:  Diagnosis Date   Acute hepatitis C without mention of hepatic coma(070.51)    Allergic rhinitis, cause unspecified    Anxiety state, unspecified    Arthritis    Barrett's esophagus    BPH (benign prostatic hyperplasia)    Cancer (HCC)    skin: face,scalp   DVT (deep venous thrombosis) (HCC)    Elevated PSA    Dr Vernie Ammons   Esophageal reflux    Heart murmur    Hepatitis C    Hiatal hernia    Hyperlipidemia    Hypertension    Hypertonicity of bladder    Insomnia    Iron deficiency anemia 04/30/2022   Iron malabsorption 04/30/2022   Leg DVT (deep venous thromboembolism), chronic, unspecified laterality (HCC) 09/21/2018   Osteoporosis, unspecified    Peripheral vascular disease (HCC)    Personal history of urinary calculi    Recent retinal detachment, partial, with giant tear    Unspecified tinnitus    Ventricular septal defect    Past Surgical History:  Procedure Laterality Date   COLONOSCOPY     KNEE SURGERY Bilateral    RETINAL DETACHMENT SURGERY     TOE SURGERY Left    great toe   TOTAL KNEE ARTHROPLASTY Left 08/25/2023   Procedure: TOTAL KNEE ARTHROPLASTY;  Surgeon: Dannielle Huh, MD;  Location: [facility];  Service: Orthopedics;  Laterality: Left;   UPPER GI ENDOSCOPY     Family History  Problem Relation Age of Onset   Ovarian cancer Mother    Deep vein thrombosis Mother     Hypertension Other    Alcohol abuse Neg Hx    Colon cancer Neg Hx    Stomach cancer Neg Hx    Esophageal cancer Neg Hx    Colon polyps Neg Hx    Social History   Socioeconomic History   Marital status: Married    Spouse name: Orlie Pollen Kearse   Number of children: 1   Years of education: Not on file   Highest education level: Associate degree: occupational, Scientist, product/process development, or vocational program  Occupational History   Occupation: retired    Associate Professor: [employer]    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   Occupation: bee keepin  Tobacco Use   Smoking status: Former    Current  packs/day: 0.00    Types: Cigarettes    Quit date: 32    Years since quitting: 49.1    Passive exposure: Past   Smokeless tobacco: Never  Vaping Use   Vaping status: Never Used  Substance and Sexual Activity   Alcohol use: Not Currently   Drug use: No   Sexual activity: Yes  Other Topics Concern   Not on file  Social History Narrative               Social Drivers of Health   Financial Resource Strain: Low Risk  (01/12/2024)   Overall Financial Resource Strain (CARDIA)    Difficulty of Paying Living Expenses: Not hard at all  Food Insecurity: No Food Insecurity (01/12/2024)   Hunger Vital Sign    Worried About Running Out of Food in the Last Year: Never true    Ran Out of Food in the Last Year: Never true  Transportation Needs: No Transportation Needs (01/12/2024)   PRAPARE - Administrator, Civil Service (Medical): No    Lack of Transportation (Non-Medical): No  Physical Activity: Sufficiently Active (01/12/2024)   Exercise Vital Sign    Days of Exercise per Week: 4 days    Minutes of Exercise per Session: 60 min  Stress: No Stress Concern Present (01/12/2024)   [employer] of Occupational Health - Occupational Stress Questionnaire    Feeling of Stress : Only a little  Social Connections: Socially Integrated (01/12/2024)   Social Connection and Isolation Panel [NHANES]    Frequency of  Communication with Friends and Family: Three times a week    Frequency of Social Gatherings with Friends and Family: Once a week    Attends Religious Services: 1 to 4 times per year    Active Member of [employer] or Organizations: Yes    Attends Banker Meetings: Never    Marital Status: Married    Tobacco Counseling - Non Smoker Counseling given: N/A    Clinical Intake:  Pre-visit preparation completed: Yes  Pain : 0-10 (arthritis) Pain Score: 2  Pain Type: Acute pain Pain Location: Back Pain Orientation: Lower Pain Descriptors / Indicators: Aching Pain Onset: 1 to 4 weeks ago Pain Frequency: Occasional Effect of Pain on Daily Activities: sees a Chiropractor     BMI - recorded: 27.5 Nutritional Status: BMI 25 -29 Overweight Nutritional Risks: None Diabetes: No  How often do you need to have someone help you when you read instructions, pamphlets, or other written materials from your doctor or pharmacy?: 1 - Never  Interpreter Needed?: No  Information entered by :: Hassell Halim, CMA   Activities of Daily Living Completed 01/12/2024    01/12/2024    1:07 PM 01/08/2024    8:50 AM  In your present state of health, do you have any difficulty performing the following activities:  Hearing? 0 0  Vision? 0 0  Difficulty concentrating or making decisions? 0 0  Walking or climbing stairs? 1 1  Comment arthritis and left knee pain   Dressing or bathing? 0 0  Doing errands, shopping? 0 0  Preparing Food and eating ? N N  Using the Toilet? N N  In the past six months, have you accidently leaked urine? Y Y  Comment urgency - sees a Urologist that monitors   Do you have problems with loss of bowel control? N N  Managing your Medications? N N  Managing your Finances? N N  Housekeeping or managing your Housekeeping? N  N    Patient Care Team: Plotnikov, Georgina Quint, MD as PCP - General Jens Som, Madolyn Frieze, MD as PCP - Cardiology (Cardiology) Sharrell Ku, MD  (Gastroenterology) Jens Som Madolyn Frieze, MD (Cardiology) Tyrone Schimke, MD as Consulting Physician (Ophthalmology) Parke Poisson, MD as Consulting Physician (Cardiology) Imogene Burn, MD as Consulting Physician (Gastroenterology) Dannielle Huh, MD as Consulting Physician (Orthopedic Surgery) Crist Fat, MD as Attending Physician (Urology)  Indicate any recent Medical Services you may have received from other than [facility] providers in the past year (date may be approximate).     Assessment:   This is a routine wellness examination for Frederico.  Hearing/Vision screen No results found.   Goals Addressed               This Visit's Progress     Patient Stated (pt-stated)        Patient stated he had left knee replacement in Oct. 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       Depression Screen Completed 01/12/2024    01/12/2024    1:09 PM 05/01/2023    8:57 AM 01/10/2023    3:25 PM 03/19/2022    2:30 PM 11/06/2021   10:56 AM 09/01/2020    9:37 AM 08/01/2020    8:50 AM  PHQ 2/9 Scores  PHQ - 2 Score 0 0 0 0 0 0 0  PHQ- 9 Score    8       Fall Risk Completed 01/12/2024    01/12/2024    1:12 PM 01/08/2024    8:50 AM 05/01/2023    8:57 AM 01/10/2023    4:27 PM 01/06/2023   12:50 PM  Fall Risk   Falls in the past year? 0 0 0 1 1  Number falls in past yr: 0 0 0 0 0  Injury with Fall? 0 0 0 0 0  Risk for fall due to : No Fall Risks  No Fall Risks No Fall Risks   Follow up Falls evaluation completed;Falls prevention discussed   Falls evaluation completed;Education provided     MEDICARE RISK AT HOME: Completed 01/12/2024 Medicare Risk at Home Any stairs in or around the home?: Yes If so, are there any without handrails?: Yes Home free of loose throw rugs in walkways, pet beds, electrical cords, etc?: Yes Adequate lighting in your home to reduce risk of falls?: Yes Life alert?: No Use of a cane, walker or w/c?: No Grab bars in the  bathroom?: Yes Shower chair or bench in shower?: Yes Elevated toilet seat or a handicapped toilet?: No  TIMED UP AND GO:  Was the test performed?  No  Cognitive Function: 6CIT completed        01/12/2024    1:12 PM 01/10/2023    4:28 PM  6CIT Screen  What Year? 0 points 0 points  What month? 0 points 0 points  What time? 0 points 0 points  Count back from 20 0 points 0 points  Months in reverse 0 points 0 points  Repeat phrase 0 points 0 points  Total Score 0 points 0 points    Immunizations Immunization History  Administered Date(s) Administered   Fluad Quad(high Dose 65+) 07/15/2019, 08/01/2020, 07/25/2021, 08/08/2022   Fluad Trivalent(High Dose 65+) 07/29/2023   Influenza Split 09/16/2011, 08/04/2012   Influenza Whole 09/07/2007, 08/25/2008, 08/29/2009, 07/13/2010   Influenza, High Dose Seasonal PF 09/07/2016, 07/14/2017, 08/24/2018   Influenza,inj,Quad PF,6+ Mos 08/17/2013, 11/30/2014, 08/21/2015  PFIZER(Purple Top)SARS-COV-2 Vaccination 12/30/2019, 01/23/2020, 09/06/2020, 07/08/2021   Pneumococcal Conjugate-13 11/09/2013   Pneumococcal Polysaccharide-23 11/13/2007, 11/22/2015   Td 10/21/2012   Tdap 07/03/2013   Zoster Recombinant(Shingrix) 05/21/2017, 07/25/2017   Zoster, Live 10/29/2012    Screening Tests Health Maintenance  Topic Date Due   DTaP/Tdap/Td (3 - Td or Tdap) 07/04/2023   COVID-19 Vaccine (5 - 2024-25 season) 07/20/2023   Medicare Annual Wellness (AWV)  01/11/2025   Pneumonia Vaccine 38+ Years old  Completed   INFLUENZA VACCINE  Completed   Hepatitis C Screening  Completed   Zoster Vaccines- Shingrix  Completed   HPV VACCINES  Aged Out   Colonoscopy  Discontinued    Health Maintenance  Health Maintenance Due  Topic Date Due   DTaP/Tdap/Td (3 - Td or Tdap) 07/04/2023   COVID-19 Vaccine (5 - 2024-25 season) 07/20/2023   Health Maintenance Items Addressed: 01/12/2024. Advised to get Tdap and COVID vaccine at local pharmacy.  Additional  Screening:  Vision Screening: Recommended annual ophthalmology exams for early detection of glaucoma and other disorders of the eye. Ophthalmologist at [facility].  Dental Screening: Recommended annual dental exams for proper oral hygiene  Community Resource Referral / Chronic Care Management: CRR required this visit?  No   CCM required this visit?  No     Plan:     I have personally reviewed and noted the following in the patient's chart:   Medical and social history Use of alcohol, tobacco or illicit drugs  Current medications and supplements including opioid prescriptions. Patient is not currently taking opioid prescriptions. Functional ability and status Nutritional status Physical activity Advanced directives List of other physicians Hospitalizations, surgeries, and ER visits in previous 12 months Vitals Screenings to include cognitive, depression, and falls Referrals and appointments  In addition, I have reviewed and discussed with patient certain preventive protocols, quality metrics, and best practice recommendations. A written personalized care plan for preventive services as well as general preventive health recommendations were provided to patient.     Darreld Mclean, CMA   01/12/2024   After Visit Summary: (MyChart) Due to this being a telephonic visit, the after visit summary with patients personalized plan was offered to patient via MyChart   Notes: Nothing significant to report at this time.

## 2024-01-14 NOTE — Progress Notes (Signed)
 Subjective:  Please attest and cosign this visit due to patients primary care provider not being in the office at the time the visit was completed.  (Pt of Dr A. Plotnikov).   Roger Kent is a 78 y.o. who presents for a Medicare Wellness preventive visit.  Visit Complete: In person  AWV Questionnaire: Yes: Patient Medicare AWV questionnaire was completed by the patient on 01/08/2024; I have confirmed that all information answered by patient is correct and no changes since this date.  Cardiac Risk Factors include: male gender;advanced age (>8men, >54 women);dyslipidemia;hypertension     Objective:    Today's Vitals   01/12/24 1302 01/12/24 1304  BP: 132/80   Pulse: 65   SpO2: 95%   Weight: 202 lb 12.8 oz (92 kg)   Height: 6' (1.829 m)   PainSc:  2    Body mass index is 27.5 kg/m.     01/12/2024   12:58 PM 10/31/2023    8:51 AM 08/25/2023    1:40 PM 08/14/2023   10:36 AM 08/12/2023   11:48 AM 03/11/2023   10:46 AM 01/10/2023    3:48 PM  Advanced Directives  Does Patient Have a Medical Advance Directive? No Yes Yes Yes Yes Yes Yes  Type of Advance Directive  Living will;Healthcare Power of [employer] Power of [place];Living will Living will;Healthcare Power of [employer] Power of [place];Living will Healthcare Power of [place];Living will Healthcare Power of [place];Living will  Does patient want to make changes to medical advance directive?  No - Patient declined No - Patient declined      Copy of Healthcare Power of Attorney in Chart?  No - copy requested No - copy requested  No - copy requested No - copy requested No - copy requested  Would patient like information on creating a medical advance directive? Yes (MAU/Ambulatory/Procedural Areas - Information given)     No - Patient declined     Current Medications (verified) Outpatient Encounter Medications as of 01/12/2024  Medication Sig   Apoaequorin (PREVAGEN PO) Take 1 capsule by mouth  daily. Pro   Bromfenac Sodium 0.07 % SOLN Place 1 drop into both eyes at bedtime.   busPIRone (BUSPAR) 7.5 MG tablet TAKE ONE TABLET BY MOUTH TWICE DAILY   cetirizine (ZYRTEC) 10 MG tablet Take 10 mg by mouth daily.   Coenzyme Q10 (CO Q 10 PO) Take 300 mg by mouth daily.   COLLAGEN PO Take 1 capsule by mouth daily. collagen hydrolyzed   Dextromethorphan-guaiFENesin (MUCINEX DM MAXIMUM STRENGTH) 60-1200 MG TB12 Take 1 tablet by mouth daily.   esomeprazole (NEXIUM) 20 MG capsule Take 1 capsule (20 mg total) by mouth daily. 30 minutes to an hour before meals   Evolocumab (REPATHA SURECLICK) 140 MG/ML SOAJ INJECT 140MG  INTO THE SKIN ONCE EVERY 14 DAYS   Glucosamine HCl (GLUCOSAMINE PO) Take 3,000 mg by mouth daily. MSM 1500 mg  1500 mg   MAGNESIUM GLUCONATE PO Take 120 mg by mouth daily.   methocarbamol (ROBAXIN) 500 MG tablet Take 1-2 tablets (500-1,000 mg total) by mouth every 6 (six) hours as needed for muscle spasms.   olmesartan (BENICAR) 40 MG tablet TAKE 1 TABLET DAILY   OVER THE COUNTER MEDICATION Take 1 Package by mouth daily. Relief factor  4 tablet each package   OVER THE COUNTER MEDICATION Take 2 tablets by mouth daily. Better Bladder 840 mg   OVER THE COUNTER MEDICATION Take 1 tablet by mouth daily. Nitricoxide Niacin   OVER THE  COUNTER MEDICATION Take 6 tablets by mouth daily. Super fruit 3 each Super veggie 3 each   OVER THE COUNTER MEDICATION Take 1 Scoop by mouth daily. Orgain organic protein powder Adds/ vital protein collagen peptides 2-4 table spoons   OVER THE COUNTER MEDICATION Take 2 tablets by mouth daily. Super beets chews   OVER THE COUNTER MEDICATION Place 1 application  into both nostrils daily. navage nasal care   PRED FORTE 1 % ophthalmic suspension Place 1 drop into the right eye 2 (two) times daily.   Probiotic Product (PROBIOTIC PO) Take 1 capsule by mouth daily. 60 billion   simethicone (MYLICON) 125 MG chewable tablet Chew 125 mg by mouth every 6 (six) hours  as needed for flatulence.   Sodium Hyaluronate, oral, (HYALURONIC ACID PO) Take 2,000 mg by mouth daily. 1000 mg each   [DISCONTINUED] ferrous sulfate 325 (65 FE) MG tablet Take 1 tablet (325 mg total) by mouth daily with breakfast.   No facility-administered encounter medications on file as of 01/12/2024.    Allergies (verified) Oxycodone-acetaminophen, Crestor [rosuvastatin], Zetia [ezetimibe], and Lamisil [terbinafine]   History: Past Medical History:  Diagnosis Date   Acute hepatitis C without mention of hepatic coma(070.51)    Allergic rhinitis, cause unspecified    Anxiety state, unspecified    Arthritis    Barrett's esophagus    BPH (benign prostatic hyperplasia)    Cancer (HCC)    skin: face,scalp   DVT (deep venous thrombosis) (HCC)    Elevated PSA    Dr Vernie Ammons   Esophageal reflux    Heart murmur    Hepatitis C    Hiatal hernia    Hyperlipidemia    Hypertension    Hypertonicity of bladder    Insomnia    Iron deficiency anemia 04/30/2022   Iron malabsorption 04/30/2022   Leg DVT (deep venous thromboembolism), chronic, unspecified laterality (HCC) 09/21/2018   Osteoporosis, unspecified    Peripheral vascular disease (HCC)    Personal history of urinary calculi    Recent retinal detachment, partial, with giant tear    Unspecified tinnitus    Ventricular septal defect    Past Surgical History:  Procedure Laterality Date   COLONOSCOPY     KNEE SURGERY Bilateral    RETINAL DETACHMENT SURGERY     TOE SURGERY Left    great toe   TOTAL KNEE ARTHROPLASTY Left 08/25/2023   Procedure: TOTAL KNEE ARTHROPLASTY;  Surgeon: Dannielle Huh, MD;  Location: [facility];  Service: Orthopedics;  Laterality: Left;   UPPER GI ENDOSCOPY     Family History  Problem Relation Age of Onset   Ovarian cancer Mother    Deep vein thrombosis Mother    Hypertension Other    Alcohol abuse Neg Hx    Colon cancer Neg Hx    Stomach cancer Neg Hx    Esophageal cancer Neg Hx    Colon polyps Neg  Hx    Social History   Socioeconomic History   Marital status: Married    Spouse name: Orlie Pollen Schwenn   Number of children: 1   Years of education: Not on file   Highest education level: Associate degree: occupational, Scientist, product/process development, or vocational program  Occupational History   Occupation: retired    Associate Professor: [employer]    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   Occupation: bee keepin  Tobacco Use   Smoking status: Former    Current packs/day: 0.00    Types: Cigarettes    Quit date: 1976  Years since quitting: 49.1    Passive exposure: Past   Smokeless tobacco: Never  Vaping Use   Vaping status: Never Used  Substance and Sexual Activity   Alcohol use: Not Currently   Drug use: No   Sexual activity: Yes  Other Topics Concern   Not on file  Social History Narrative               Social Drivers of Health   Financial Resource Strain: Low Risk  (01/12/2024)   Overall Financial Resource Strain (CARDIA)    Difficulty of Paying Living Expenses: Not hard at all  Food Insecurity: No Food Insecurity (01/12/2024)   Hunger Vital Sign    Worried About Running Out of Food in the Last Year: Never true    Ran Out of Food in the Last Year: Never true  Transportation Needs: No Transportation Needs (01/12/2024)   PRAPARE - Administrator, Civil Service (Medical): No    Lack of Transportation (Non-Medical): No  Physical Activity: Sufficiently Active (01/12/2024)   Exercise Vital Sign    Days of Exercise per Week: 4 days    Minutes of Exercise per Session: 60 min  Stress: No Stress Concern Present (01/12/2024)   [employer] of Occupational Health - Occupational Stress Questionnaire    Feeling of Stress : Only a little  Social Connections: Socially Integrated (01/12/2024)   Social Connection and Isolation Panel [NHANES]    Frequency of Communication with Friends and Family: Three times a week    Frequency of Social Gatherings with Friends and Family: Once a week    Attends Religious  Services: 1 to 4 times per year    Active Member of [employer] or Organizations: Yes    Attends Banker Meetings: Never    Marital Status: Married    Tobacco Counseling - Non Smoker Counseling given: N/A    Clinical Intake:  Pre-visit preparation completed: Yes  Pain : 0-10 (arthritis) Pain Score: 2  Pain Type: Acute pain Pain Location: Back Pain Orientation: Lower Pain Descriptors / Indicators: Aching Pain Onset: 1 to 4 weeks ago Pain Frequency: Occasional Effect of Pain on Daily Activities: sees a Chiropractor     BMI - recorded: 27.5 Nutritional Status: BMI 25 -29 Overweight Nutritional Risks: None Diabetes: No  How often do you need to have someone help you when you read instructions, pamphlets, or other written materials from your doctor or pharmacy?: 1 - Never  Interpreter Needed?: No  Information entered by :: Hassell Halim, CMA   Activities of Daily Living Completed 01/12/2024    01/12/2024    1:07 PM 01/08/2024    8:50 AM  In your present state of health, do you have any difficulty performing the following activities:  Hearing? 0 0  Vision? 0 0  Difficulty concentrating or making decisions? 0 0  Walking or climbing stairs? 1 1  Comment arthritis and left knee pain   Dressing or bathing? 0 0  Doing errands, shopping? 0 0  Preparing Food and eating ? N N  Using the Toilet? N N  In the past six months, have you accidently leaked urine? Y Y  Comment urgency - sees a Urologist that monitors   Do you have problems with loss of bowel control? N N  Managing your Medications? N N  Managing your Finances? N N  Housekeeping or managing your Housekeeping? N N    Patient Care Team: Plotnikov, Georgina Quint, MD as PCP - Tamsen Roers,  Madolyn Frieze, MD as PCP - Cardiology (Cardiology) Sharrell Ku, MD (Gastroenterology) Jens Som Madolyn Frieze, MD (Cardiology) Tyrone Schimke, MD as Consulting Physician (Ophthalmology) Parke Poisson, MD as Consulting  Physician (Cardiology) Imogene Burn, MD as Consulting Physician (Gastroenterology) Dannielle Huh, MD as Consulting Physician (Orthopedic Surgery) Crist Fat, MD as Attending Physician (Urology)  Indicate any recent Medical Services you may have received from other than [facility] providers in the past year (date may be approximate).     Assessment:   This is a routine wellness examination for Mico.  Hearing/Vision screen No results found.   Goals Addressed               This Visit's Progress     Patient Stated (pt-stated)        Patient stated he had left knee replacement in Oct. 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       Depression Screen Completed 01/12/2024    01/12/2024    1:09 PM 05/01/2023    8:57 AM 01/10/2023    3:25 PM 03/19/2022    2:30 PM 11/06/2021   10:56 AM 09/01/2020    9:37 AM 08/01/2020    8:50 AM  PHQ 2/9 Scores  PHQ - 2 Score 0 0 0 0 0 0 0  PHQ- 9 Score    8       Fall Risk Completed 01/12/2024    01/12/2024    1:12 PM 01/08/2024    8:50 AM 05/01/2023    8:57 AM 01/10/2023    4:27 PM 01/06/2023   12:50 PM  Fall Risk   Falls in the past year? 0 0 0 1 1  Number falls in past yr: 0 0 0 0 0  Injury with Fall? 0 0 0 0 0  Risk for fall due to : No Fall Risks  No Fall Risks No Fall Risks   Follow up Falls evaluation completed;Falls prevention discussed   Falls evaluation completed;Education provided     MEDICARE RISK AT HOME: Completed 01/12/2024 Medicare Risk at Home Any stairs in or around the home?: Yes If so, are there any without handrails?: Yes Home free of loose throw rugs in walkways, pet beds, electrical cords, etc?: Yes Adequate lighting in your home to reduce risk of falls?: Yes Life alert?: No Use of a cane, walker or w/c?: No Grab bars in the bathroom?: Yes Shower chair or bench in shower?: Yes Elevated toilet seat or a handicapped toilet?: No  TIMED UP AND GO:  Was the test performed?   No  Cognitive Function: 6CIT completed        01/12/2024    1:12 PM 01/10/2023    4:28 PM  6CIT Screen  What Year? 0 points 0 points  What month? 0 points 0 points  What time? 0 points 0 points  Count back from 20 0 points 0 points  Months in reverse 0 points 0 points  Repeat phrase 0 points 0 points  Total Score 0 points 0 points    Immunizations Immunization History  Administered Date(s) Administered   Fluad Quad(high Dose 65+) 07/15/2019, 08/01/2020, 07/25/2021, 08/08/2022   Fluad Trivalent(High Dose 65+) 07/29/2023   Influenza Split 09/16/2011, 08/04/2012   Influenza Whole 09/07/2007, 08/25/2008, 08/29/2009, 07/13/2010   Influenza, High Dose Seasonal PF 09/07/2016, 07/14/2017, 08/24/2018   Influenza,inj,Quad PF,6+ Mos 08/17/2013, 11/30/2014, 08/21/2015   PFIZER(Purple Top)SARS-COV-2 Vaccination 12/30/2019, 01/23/2020, 09/06/2020, 07/08/2021   Pneumococcal Conjugate-13 11/09/2013   Pneumococcal  Polysaccharide-23 11/13/2007, 11/22/2015   Td 10/21/2012   Tdap 07/03/2013   Zoster Recombinant(Shingrix) 05/21/2017, 07/25/2017   Zoster, Live 10/29/2012    Screening Tests Health Maintenance  Topic Date Due   DTaP/Tdap/Td (3 - Td or Tdap) 07/04/2023   COVID-19 Vaccine (5 - 2024-25 season) 07/20/2023   Medicare Annual Wellness (AWV)  01/11/2025   Pneumonia Vaccine 42+ Years old  Completed   INFLUENZA VACCINE  Completed   Hepatitis C Screening  Completed   Zoster Vaccines- Shingrix  Completed   HPV VACCINES  Aged Out   Colonoscopy  Discontinued    Health Maintenance  Health Maintenance Due  Topic Date Due   DTaP/Tdap/Td (3 - Td or Tdap) 07/04/2023   COVID-19 Vaccine (5 - 2024-25 season) 07/20/2023   Health Maintenance Items Addressed: 01/12/2024. Advised to get Tdap and COVID vaccine at local pharmacy.  Additional Screening:  Vision Screening: Recommended annual ophthalmology exams for early detection of glaucoma and other disorders of the eye. [facility]mologist at  [facility].  Dental Screening: Recommended annual dental exams for proper oral hygiene  Community Resource Referral / Chronic Care Management: CRR required this visit?  No   CCM required this visit?  No     Plan:     I have personally reviewed and noted the following in the patient's chart:   Medical and social history Use of alcohol, tobacco or illicit drugs  Current medications and supplements including opioid prescriptions. Patient is not currently taking opioid prescriptions. Functional ability and status Nutritional status Physical activity Advanced directives List of other physicians Hospitalizations, surgeries, and ER visits in previous 12 months Vitals Screenings to include cognitive, depression, and falls Referrals and appointments  In addition, I have reviewed and discussed with patient certain preventive protocols, quality metrics, and best practice recommendations. A written personalized care plan for preventive services as well as general preventive health recommendations were provided to patient.     Darreld Mclean, CMA   01/14/2024   After Visit Summary: (MyChart) Due to this being a telephonic visit, the after visit summary with patients personalized plan was offered to patient via MyChart   Notes: Nothing significant to report at this time.

## 2024-01-23 ENCOUNTER — Encounter: Payer: Self-pay | Admitting: Internal Medicine

## 2024-01-23 ENCOUNTER — Ambulatory Visit: Payer: Medicare Other | Admitting: Internal Medicine

## 2024-01-23 VITALS — BP 120/78 | HR 73 | Temp 97.8°F | Ht 72.0 in | Wt 205.0 lb

## 2024-01-23 DIAGNOSIS — Z7184 Encounter for health counseling related to travel: Secondary | ICD-10-CM | POA: Diagnosis not present

## 2024-01-23 DIAGNOSIS — M25561 Pain in right knee: Secondary | ICD-10-CM

## 2024-01-23 DIAGNOSIS — M25562 Pain in left knee: Secondary | ICD-10-CM | POA: Diagnosis not present

## 2024-01-23 DIAGNOSIS — I10 Essential (primary) hypertension: Secondary | ICD-10-CM | POA: Diagnosis not present

## 2024-01-23 DIAGNOSIS — N183 Chronic kidney disease, stage 3 unspecified: Secondary | ICD-10-CM | POA: Diagnosis not present

## 2024-01-23 DIAGNOSIS — G8929 Other chronic pain: Secondary | ICD-10-CM

## 2024-01-23 DIAGNOSIS — E785 Hyperlipidemia, unspecified: Secondary | ICD-10-CM | POA: Diagnosis not present

## 2024-01-23 DIAGNOSIS — Z23 Encounter for immunization: Secondary | ICD-10-CM

## 2024-01-23 DIAGNOSIS — F411 Generalized anxiety disorder: Secondary | ICD-10-CM | POA: Diagnosis not present

## 2024-01-23 MED ORDER — METHYLPREDNISOLONE 4 MG PO TBPK: ORAL_TABLET | ORAL | 0 refills | Status: DC

## 2024-01-23 MED ORDER — AZITHROMYCIN 250 MG PO TABS: ORAL_TABLET | ORAL | 0 refills | Status: DC

## 2024-01-23 NOTE — Progress Notes (Signed)
 Subjective:  Patient ID: Roger Kent, male    DOB: 1945/11/24  Age: 78 y.o. MRN: [id number]  CC: Annual Exam   HPI Roger Kent presents for OA, anxiety, GERD  Outpatient Medications Prior to Visit  Medication Sig Dispense Refill  . Apoaequorin (PREVAGEN PO) Take 1 capsule by mouth daily. Pro    . Bromfenac Sodium 0.07 % SOLN Place 1 drop into both eyes at bedtime.    . busPIRone (BUSPAR) 7.5 MG tablet TAKE ONE TABLET BY MOUTH TWICE DAILY 60 tablet 5  . cetirizine (ZYRTEC) 10 MG tablet Take 10 mg by mouth daily.    . Coenzyme Q10 (CO Q 10 PO) Take 300 mg by mouth daily.    . COLLAGEN PO Take 1 capsule by mouth daily. collagen hydrolyzed    . Dextromethorphan-guaiFENesin (MUCINEX DM MAXIMUM STRENGTH) 60-1200 MG TB12 Take 1 tablet by mouth daily.    Marland Kitchen esomeprazole (NEXIUM) 20 MG capsule Take 1 capsule (20 mg total) by mouth daily. 30 minutes to an hour before meals 90 capsule 3  . Evolocumab (REPATHA SURECLICK) 140 MG/ML SOAJ INJECT 140MG  INTO THE SKIN ONCE EVERY 14 DAYS 6 mL 3  . Glucosamine HCl (GLUCOSAMINE PO) Take 3,000 mg by mouth daily. MSM 1500 mg  1500 mg    . MAGNESIUM GLUCONATE PO Take 120 mg by mouth daily.    . methocarbamol (ROBAXIN) 500 MG tablet Take 1-2 tablets (500-1,000 mg total) by mouth every 6 (six) hours as needed for muscle spasms. 60 tablet 0  . olmesartan (BENICAR) 40 MG tablet TAKE 1 TABLET DAILY 90 tablet 3  . OVER THE COUNTER MEDICATION Take 1 Package by mouth daily. Relief factor  4 tablet each package    . OVER THE COUNTER MEDICATION Take 1 tablet by mouth daily. Nitricoxide Niacin    . OVER THE COUNTER MEDICATION Take 6 tablets by mouth daily. Super fruit 3 each Super veggie 3 each    . OVER THE COUNTER MEDICATION Take 1 Scoop by mouth daily. Orgain organic protein powder Adds/ vital protein collagen peptides 2-4 table spoons    . OVER THE COUNTER MEDICATION Take 2 tablets by mouth daily. Super beets chews    . PRED FORTE 1 %  ophthalmic suspension Place 1 drop into the right eye 2 (two) times daily.    . Probiotic Product (PROBIOTIC PO) Take 1 capsule by mouth daily. 60 billion    . Sodium Hyaluronate, oral, (HYALURONIC ACID PO) Take 2,000 mg by mouth daily. 1000 mg each    . OVER THE COUNTER MEDICATION Place 1 application  into both nostrils daily. navage nasal care (Patient not taking: Reported on 01/23/2024)    . simethicone (MYLICON) 125 MG chewable tablet Chew 125 mg by mouth every 6 (six) hours as needed for flatulence. (Patient not taking: Reported on 01/23/2024)    . OVER THE COUNTER MEDICATION Take 2 tablets by mouth daily. Better Bladder 840 mg (Patient not taking: Reported on 01/23/2024)     No facility-administered medications prior to visit.    ROS: Review of Systems  Constitutional:  Negative for appetite change, fatigue and unexpected weight change.  HENT:  Negative for congestion, nosebleeds, sneezing, sore throat and trouble swallowing.   Eyes:  Negative for itching and visual disturbance.  Respiratory:  Negative for cough.   Cardiovascular:  Negative for chest pain, palpitations and leg swelling.  Gastrointestinal:  Negative for abdominal distention, blood in stool, diarrhea and nausea.  Genitourinary:  Negative for  frequency and hematuria.  Musculoskeletal:  Positive for arthralgias, back pain, gait problem, neck pain and neck stiffness. Negative for joint swelling.  Skin:  Negative for rash.  Neurological:  Negative for dizziness, tremors, speech difficulty and weakness.  Hematological:  Does not bruise/bleed easily.  Psychiatric/Behavioral:  Negative for agitation, dysphoric mood, self-injury, sleep disturbance and suicidal ideas. The patient is not nervous/anxious.     Objective:  BP 120/78 (BP Location: Left Arm, Patient Position: Sitting, Cuff Size: Normal)   Pulse 73   Temp 97.8 F (36.6 C) (Oral)   Ht 6' (1.829 m)   Wt 205 lb (93 kg)   SpO2 93%   BMI 27.80 kg/m   BP Readings from  Last 3 Encounters:  01/23/24 120/78  01/12/24 132/80  11/24/23 (!) 142/83    Wt Readings from Last 3 Encounters:  01/23/24 205 lb (93 kg)  01/12/24 202 lb 12.8 oz (92 kg)  11/24/23 203 lb (92.1 kg)    Physical Exam Constitutional:      General: He is not in acute distress.    Appearance: Normal appearance. He is well-developed.     Comments: NAD  Eyes:     Conjunctiva/sclera: Conjunctivae normal.     Pupils: Pupils are equal, round, and reactive to light.  Neck:     Thyroid: No thyromegaly.     Vascular: No JVD.  Cardiovascular:     Rate and Rhythm: Normal rate and regular rhythm.     Heart sounds: Normal heart sounds. No murmur heard.    No friction rub. No gallop.  Pulmonary:     Effort: Pulmonary effort is normal. No respiratory distress.     Breath sounds: Normal breath sounds. No wheezing or rales.  Chest:     Chest wall: No tenderness.  Abdominal:     General: Bowel sounds are normal. There is no distension.     Palpations: Abdomen is soft. There is no mass.     Tenderness: There is no abdominal tenderness. There is no guarding or rebound.  Musculoskeletal:        General: Tenderness present. Normal range of motion.     Cervical back: Normal range of motion.     Right lower leg: No edema.     Left lower leg: No edema.  Lymphadenopathy:     Cervical: No cervical adenopathy.  Skin:    General: Skin is warm and dry.     Findings: No rash.  Neurological:     Mental Status: He is alert and oriented to person, place, and time.     Cranial Nerves: No cranial nerve deficit.     Motor: No abnormal muscle tone.     Coordination: Coordination normal.     Gait: Gait normal.     Deep Tendon Reflexes: Reflexes are normal and symmetric.  Psychiatric:        Behavior: Behavior normal.        Thought Content: Thought content normal.        Judgment: Judgment normal.    Lab Results  Component Value Date   WBC 7.7 10/31/2023   HGB 15.0 10/31/2023   HCT 45.6  10/31/2023   PLT 241 10/31/2023   GLUCOSE 123 (H) 10/31/2023   CHOL 115 07/18/2023   TRIG 69 07/18/2023   HDL 47 07/18/2023   LDLCALC 54 07/18/2023   ALT 17 10/31/2023   AST 20 10/31/2023   NA 134 (L) 10/31/2023   K 4.6 10/31/2023   CL 97 (  L) 10/31/2023   CREATININE 1.05 10/31/2023   BUN 20 10/31/2023   CO2 31 10/31/2023   TSH 2.72 05/01/2023   PSA 20.78 (H) 05/01/2023   INR 2.4 RATIO (H) 09/17/2007   HGBA1C 5.8 12/29/2018    VAS [place] LOWER EXTREMITY VENOUS (DVT) Result Date: 11/28/2023  Lower Venous DVT Study Patient Name:  LASHAUN KRAPF  Date of Exam:   11/28/2023 Medical Rec #: [id number]               Accession #:    [id number] Date of Birth: July 05, 1946               Patient Gender: M Patient Age:   8 years Exam Location:  [facility] Procedure:      VAS [place] LOWER EXTREMITY VENOUS (DVT) Referring Phys: Arlan Organ --------------------------------------------------------------------------------  Indications: Swelling, and Pain.  Risk Factors: DVT History DVT in right peroneal veins and Left SFV and popliteal vein. Performing Technologist: Jake Seats RDMS, RVT, RDCS  Examination Guidelines: A complete evaluation includes B-mode imaging, spectral Doppler, color Doppler, and power Doppler as needed of all accessible portions of each vessel. Bilateral testing is considered an integral part of a complete examination. Limited examinations for reoccurring indications may be performed as noted. The reflux portion of the exam is performed with the patient in reverse Trendelenburg.  +---------+---------------+---------+-----------+----------+--------------+ RIGHT    CompressibilityPhasicitySpontaneityPropertiesThrombus Aging +---------+---------------+---------+-----------+----------+--------------+ CFV      Full           Yes      Yes                                 +---------+---------------+---------+-----------+----------+--------------+ SFJ      Full           Yes       Yes                                 +---------+---------------+---------+-----------+----------+--------------+ FV Prox  Full           Yes      Yes                                 +---------+---------------+---------+-----------+----------+--------------+ FV Mid   Full           Yes      Yes                                 +---------+---------------+---------+-----------+----------+--------------+ FV DistalFull           Yes      Yes                                 +---------+---------------+---------+-----------+----------+--------------+ PFV      Full           Yes      Yes                                 +---------+---------------+---------+-----------+----------+--------------+ POP      Full           Yes      Yes                                 +---------+---------------+---------+-----------+----------+--------------+  PTV      Full           Yes      Yes                                 +---------+---------------+---------+-----------+----------+--------------+ PERO     Full           Yes      Yes                                 +---------+---------------+---------+-----------+----------+--------------+ Gastroc  Full           Yes      Yes                                 +---------+---------------+---------+-----------+----------+--------------+ GSV      Full           Yes      Yes                                 +---------+---------------+---------+-----------+----------+--------------+ cystic structure seen in right popliteal fossa maesuring 4.8 cm x 2.3 cm.  +---------+---------------+---------+-----------+----------+--------------+ LEFT     CompressibilityPhasicitySpontaneityPropertiesThrombus Aging +---------+---------------+---------+-----------+----------+--------------+ CFV      Full           Yes      Yes                                 +---------+---------------+---------+-----------+----------+--------------+ SFJ       Full           Yes      Yes                                 +---------+---------------+---------+-----------+----------+--------------+ FV Prox  Full           Yes      Yes                                 +---------+---------------+---------+-----------+----------+--------------+ FV Mid   Full           Yes      Yes                                 +---------+---------------+---------+-----------+----------+--------------+ FV DistalPartial        Yes      Yes                                 +---------+---------------+---------+-----------+----------+--------------+ PFV      Full           Yes      Yes                                 +---------+---------------+---------+-----------+----------+--------------+ POP      Partial        Yes      Yes                                 +---------+---------------+---------+-----------+----------+--------------+  PTV      Full           Yes      Yes                                 +---------+---------------+---------+-----------+----------+--------------+ PERO     Full           Yes      Yes                                 +---------+---------------+---------+-----------+----------+--------------+ Gastroc  Full           Yes      Yes                                 +---------+---------------+---------+-----------+----------+--------------+ GSV      Full           Yes      Yes                                 +---------+---------------+---------+-----------+----------+--------------+ Stable chronic partially occlusive thrombus within the LEFT superficial femoral and popliteal veins     Summary: RIGHT: - No evidence of deep vein thrombosis in the lower extremity. No indirect evidence of obstruction proximal to the inguinal ligament.  Baker's cyst in right popliteal fossa  LEFT: - Findings consistent with chronic deep vein thrombosis involving the left femoral vein, and left popliteal vein.  - Findings suggest  resolution of previously noted thrombus. - All other veins visualized appear fully compressible and demonstrate appropriate Doppler characteristics.  *See table(s) above for measurements and observations. Electronically signed by Lemar Livings MD on 11/28/2023 at 7:22:01 PM.    Final    VAS [place] AAA DUPLEX Result Date: 11/28/2023 ABDOMINAL AORTA STUDY Patient Name:  TREV BOLEY  Date of Exam:   11/28/2023 Medical Rec #: [id number]               Accession #:    [id number] Date of Birth: 30-Jul-1946               Patient Gender: M Patient Age:   89 years Exam Location:  [facility] Procedure:      VAS [place] AAA DUPLEX Referring Phys: Arlys John CRENSHAW --------------------------------------------------------------------------------  Indications: Follow up exam for known AAA. Risk Factors: Hypertension, hyperlipidemia, past history of smoking, coronary               artery disease. Limitations: Air/bowel gas.  Performing Technologist: Jake Seats RDMS, RVT, RDCS  Examination Guidelines: A complete evaluation includes B-mode imaging, spectral Doppler, color Doppler, and power Doppler as needed of all accessible portions of each vessel. Bilateral testing is considered an integral part of a complete examination. Limited examinations for reoccurring indications may be performed as noted.  Abdominal Aorta Findings: +-------------+-------+----------+----------+---------+--------+--------+ Location     AP (cm)Trans (cm)PSV (cm/s)Waveform ThrombusComments +-------------+-------+----------+----------+---------+--------+--------+ Proximal     2.90   3.00      71                                  +-------------+-------+----------+----------+---------+--------+--------+ Mid          2.50   2.60  62                                  +-------------+-------+----------+----------+---------+--------+--------+ Distal       2.60   2.60      38                                   +-------------+-------+----------+----------+---------+--------+--------+ RT CIA Prox  1.8    1.9       73        triphasic                 +-------------+-------+----------+----------+---------+--------+--------+ RT CIA Mid                    59        triphasic                 +-------------+-------+----------+----------+---------+--------+--------+ RT CIA Distal                 47        triphasic                 +-------------+-------+----------+----------+---------+--------+--------+ RT EIA Prox                   176       triphasic                 +-------------+-------+----------+----------+---------+--------+--------+ RT EIA Mid                    150       triphasic                 +-------------+-------+----------+----------+---------+--------+--------+ RT EIA Distal1.1    1.3       90        triphasic                 +-------------+-------+----------+----------+---------+--------+--------+ LT CIA Prox  1.8    1.8       57        triphasic                 +-------------+-------+----------+----------+---------+--------+--------+ LT CIA Mid                    61        triphasic                 +-------------+-------+----------+----------+---------+--------+--------+ LT CIA Distal                 58        triphasic                 +-------------+-------+----------+----------+---------+--------+--------+ LT EIA Prox                   149       triphasic                 +-------------+-------+----------+----------+---------+--------+--------+ LT EIA Mid                    133       triphasic                 +-------------+-------+----------+----------+---------+--------+--------+ LT EIA Distal1.5    1.4       64  triphasic                 +-------------+-------+----------+----------+---------+--------+--------+ Torturous common iliac arteries. IVC/Iliac Findings: +--------+------+--------+--------+   IVC    PatentThrombusComments +--------+------+--------+--------+ IVC Proxpatent                 +--------+------+--------+--------+    Summary: Abdominal Aorta: No evidence of an abdominal aortic aneurysm was visualized. There is evidence of abnormal dilation of the Right Common Iliac artery and Left Common Iliac artery. The largest aortic measurement is 3.0 cm. The largest aortic diameter remains essentially unchanged compared to prior exam. Previous diameter measurement was 3.0 cm obtained on 12/11/22. IVC/Iliac: There is no evidence of thrombus involving the IVC.  *See table(s) above for measurements and observations.  Electronically signed by Nanetta Batty MD on 11/28/2023 at 3:20:00 PM.    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 azithromycin (ZITHROMAX Z-PAK) 250 MG tablet    Sig: As directed    Dispense:  6 tablet    Refill:  0  . methylPREDNISolone (MEDROL DOSEPAK) 4 MG TBPK tablet    Sig: As directed    Dispense:  21 tablet    Refill:  0      Follow-up: Return in about 6 months (around 07/25/2024) for a follow-up visit.  Sonda Primes, MD

## 2024-01-23 NOTE — Assessment & Plan Note (Signed)
 Cruise - [place] Medrol pack Zpack, Imodium

## 2024-01-23 NOTE — Assessment & Plan Note (Addendum)
ASA Pravastatin 

## 2024-01-23 NOTE — Assessment & Plan Note (Signed)
Chronic Nl BP at home On Benicar

## 2024-01-23 NOTE — Assessment & Plan Note (Signed)
 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

## 2024-01-23 NOTE — Assessment & Plan Note (Addendum)
 R>>L S/p L knee TKR Dr Valentina Gu

## 2024-01-23 NOTE — Assessment & Plan Note (Signed)
GFR>60 -resolved

## 2024-01-29 ENCOUNTER — Telehealth: Payer: Self-pay

## 2024-01-29 NOTE — Telephone Encounter (Signed)
 Good morning!  Following back up on this patient's osteoporosis. Saw his bone density had gotten better on his dexa scan. Were you still wanting him to continue with prolia?

## 2024-02-03 NOTE — Telephone Encounter (Signed)
Yes, please. Thanks!

## 2024-02-04 ENCOUNTER — Telehealth: Payer: Self-pay

## 2024-02-04 NOTE — Telephone Encounter (Signed)
 Erroneous

## 2024-02-06 ENCOUNTER — Other Ambulatory Visit: Payer: Self-pay

## 2024-02-06 DIAGNOSIS — M810 Age-related osteoporosis without current pathological fracture: Secondary | ICD-10-CM

## 2024-02-06 MED ORDER — DENOSUMAB 60 MG/ML ~~LOC~~ SOSY: 60.0000 mg | PREFILLED_SYRINGE | Freq: Once | SUBCUTANEOUS | Status: AC

## 2024-02-09 ENCOUNTER — Encounter (INDEPENDENT_AMBULATORY_CARE_PROVIDER_SITE_OTHER): Payer: Medicare Other | Admitting: Ophthalmology

## 2024-02-11 ENCOUNTER — Telehealth: Payer: Self-pay

## 2024-02-11 NOTE — Telephone Encounter (Signed)
 Good afternoon ya'll!  Patient is overdue for prolia injection by about 2 months on our end. Is there anyway to expedite his order? Thanks in advance!

## 2024-02-11 NOTE — Telephone Encounter (Signed)
 Benefit verification started. Will update referral once complete.

## 2024-02-11 NOTE — Telephone Encounter (Signed)
 Prolia VOB initiated via [email or web address]  Next Prolia inj DUE: NOW

## 2024-02-12 ENCOUNTER — Other Ambulatory Visit (HOSPITAL_COMMUNITY): Payer: Self-pay

## 2024-02-12 ENCOUNTER — Encounter: Payer: Self-pay | Admitting: Hematology & Oncology

## 2024-02-12 NOTE — Telephone Encounter (Signed)
 Copied from CRM [id number]. Topic: General - Other >> Feb 12, 2024 12:26 PM Eunice Blase wrote: Reason for CRM: Received call from [facility] per Dereisha ph: [phone] does not require prior authorization for Prolia shot reference# [id number]. For further clarification please call.

## 2024-02-12 NOTE — Telephone Encounter (Signed)
 Marland Kitchen

## 2024-02-12 NOTE — Telephone Encounter (Signed)
 Pt ready for scheduling for PROLIA on or after : 02/12/24  Option# 1: Buy/Bill (Office supplied medication)  Out-of-pocket cost due at time of clinic visit: $0  Number of injection/visits approved: ---  Primary: MEDICARE Prolia co-insurance: 0% Admin fee co-insurance: 0%  Secondary: [employer] [place] Prolia co-insurance: covers the Medicare Part B co-insurance and deductible Admin fee co-insurance:   Medical Benefit Details: Date Benefits were checked: 02/11/24 Deductible: $257 Met of $257 Required/ Coinsurance: 0%/ Admin Fee: 0%  Prior Auth: N/A PA# Expiration Date:   # of doses approved: ----------------------------------------------------------------------- Option# 2- Med Obtained from pharmacy:  Pharmacy benefit: Copay $170 (Paid to pharmacy) Admin Fee: 0% (Pay at clinic)  Prior Auth: N/A PA# Expiration Date:   # of doses approved:   If patient wants fill through the pharmacy benefit please send prescription to:  [facility] , and include estimated need by date in rx notes. Pharmacy will ship medication directly to the office.  Patient NOT eligible for Prolia Copay Card. Copay Card can make patient's cost as little as $25. Link to apply: [email or web address]://[email or web address].[email or web address]/copay  ** This summary of benefits is an estimation of the patient's out-of-pocket cost. Exact cost may very based on individual plan coverage.

## 2024-02-16 ENCOUNTER — Ambulatory Visit: Admitting: Internal Medicine

## 2024-02-16 DIAGNOSIS — M810 Age-related osteoporosis without current pathological fracture: Secondary | ICD-10-CM | POA: Diagnosis not present

## 2024-02-16 MED ORDER — DENOSUMAB 60 MG/ML ~~LOC~~ SOSY: 60.0000 mg | PREFILLED_SYRINGE | Freq: Once | SUBCUTANEOUS | Status: DC

## 2024-02-16 NOTE — Progress Notes (Addendum)
 Per orders of Dr. Georgia Kipper, injection of Prolia  60mg  given by [name]. Patient tolerated injection well.   Medical screening examination/treatment/procedure(s) were performed by non-physician practitioner and as supervising physician I was immediately available for consultation/collaboration.  I agree with above. [name], MD  Prolia  injection was administered the patient from the office supply. [name],CMA  Injection was given on 02/16/2024 from the office supply.  Dated as of 03/22/2024 due to [facility] message given "unable to give in the past".

## 2024-02-27 ENCOUNTER — Telehealth: Payer: Self-pay | Admitting: Internal Medicine

## 2024-02-27 NOTE — Telephone Encounter (Signed)
 Copied from CRM [id number]. Topic: Clinical - Prescription Issue >> Feb 27, 2024 11:59 AM [place] E wrote: Reason for CRM: Patient called in stating that [facility] has tried contacting the office several times in regards to the patient's olmesartan (BENICAR) 40 MG tablet medication. Patient would like a callback when PCP gets in contact with [facility]. Callback number for patient is [phone].

## 2024-03-01 ENCOUNTER — Inpatient Hospital Stay: Payer: Medicare Other | Admitting: Family

## 2024-03-01 ENCOUNTER — Inpatient Hospital Stay: Payer: Medicare Other | Attending: Hematology & Oncology

## 2024-03-01 VITALS — BP 157/85 | HR 69 | Wt 202.8 lb

## 2024-03-01 DIAGNOSIS — I82509 Chronic embolism and thrombosis of unspecified deep veins of unspecified lower extremity: Secondary | ICD-10-CM

## 2024-03-01 DIAGNOSIS — D509 Iron deficiency anemia, unspecified: Secondary | ICD-10-CM | POA: Insufficient documentation

## 2024-03-01 DIAGNOSIS — I471 Supraventricular tachycardia, unspecified: Secondary | ICD-10-CM | POA: Insufficient documentation

## 2024-03-01 DIAGNOSIS — I82512 Chronic embolism and thrombosis of left femoral vein: Secondary | ICD-10-CM | POA: Diagnosis not present

## 2024-03-01 DIAGNOSIS — D500 Iron deficiency anemia secondary to blood loss (chronic): Secondary | ICD-10-CM | POA: Diagnosis not present

## 2024-03-01 DIAGNOSIS — I82559 Chronic embolism and thrombosis of unspecified peroneal vein: Secondary | ICD-10-CM | POA: Diagnosis not present

## 2024-03-01 DIAGNOSIS — D6862 Lupus anticoagulant syndrome: Secondary | ICD-10-CM | POA: Insufficient documentation

## 2024-03-01 DIAGNOSIS — Z7901 Long term (current) use of anticoagulants: Secondary | ICD-10-CM | POA: Diagnosis not present

## 2024-03-01 LAB — CBC WITH DIFFERENTIAL (CANCER CENTER ONLY)
Abs Immature Granulocytes: 0.02 10*3/uL (ref 0.00–0.07)
Basophils Absolute: 0 10*3/uL (ref 0.0–0.1)
Basophils Relative: 1 %
Eosinophils Absolute: 0.3 10*3/uL (ref 0.0–0.5)
Eosinophils Relative: 3 %
HCT: 44.6 % (ref 39.0–52.0)
Hemoglobin: 14.8 g/dL (ref 13.0–17.0)
Immature Granulocytes: 0 %
Lymphocytes Relative: 35 %
Lymphs Abs: 3.1 10*3/uL (ref 0.7–4.0)
MCH: 31.3 pg (ref 26.0–34.0)
MCHC: 33.2 g/dL (ref 30.0–36.0)
MCV: 94.3 fL (ref 80.0–100.0)
Monocytes Absolute: 0.8 10*3/uL (ref 0.1–1.0)
Monocytes Relative: 9 %
Neutro Abs: 4.7 10*3/uL (ref 1.7–7.7)
Neutrophils Relative %: 52 %
Platelet Count: 225 10*3/uL (ref 150–400)
RBC: 4.73 MIL/uL (ref 4.22–5.81)
RDW: 14.6 % (ref 11.5–15.5)
WBC Count: 8.8 10*3/uL (ref 4.0–10.5)
nRBC: 0 % (ref 0.0–0.2)

## 2024-03-01 LAB — CMP (CANCER CENTER ONLY)
ALT: 12 U/L (ref 0–44)
AST: 17 U/L (ref 15–41)
Albumin: 4.6 g/dL (ref 3.5–5.0)
Alkaline Phosphatase: 55 U/L (ref 38–126)
Anion gap: 7 (ref 5–15)
BUN: 21 mg/dL (ref 8–23)
CO2: 28 mmol/L (ref 22–32)
Calcium: 9.7 mg/dL (ref 8.9–10.3)
Chloride: 104 mmol/L (ref 98–111)
Creatinine: 1.05 mg/dL (ref 0.61–1.24)
GFR, Estimated: >60 mL/min (ref >60)
Glucose, Bld: 96 mg/dL (ref 70–99)
Potassium: 5 mmol/L (ref 3.5–5.1)
Sodium: 139 mmol/L (ref 135–145)
Total Bilirubin: 0.6 mg/dL (ref 0.0–1.2)
Total Protein: 7.6 g/dL (ref 6.5–8.1)

## 2024-03-01 LAB — FERRITIN: Ferritin: 43 ng/mL (ref 24–336)

## 2024-03-01 LAB — IRON AND IRON BINDING CAPACITY (CC-WL,HP ONLY)
Iron: 87 ug/dL (ref 45–182)
Saturation Ratios: 26 % (ref 17.9–39.5)
TIBC: 342 ug/dL (ref 250–450)
UIBC: 255 ug/dL (ref 117–376)

## 2024-03-01 NOTE — Progress Notes (Signed)
 Hematology and Oncology Follow Up Visit  Roger Kent [id number] 02-23-1946 78 y.o. 03/01/2024   Principle Diagnosis:  Chronic DVT left femoral and popliteal veins DVT of the right peroneal vein SVT of the right saphenous vein (+) lupus anti-coagulant Iron deficiency anemia   Current Therapy:        Xarelto 20 mg po q day -- 1 yr of therapeutic to complete in 05/2019 Xarelto 10 mg po q day -- maintenance x 73yr -- start 05/2019 -- completed on 06/2020 EC ASA 81 mg po q day -- started on 01/11/2019 -- changed to 325 mg po q day on 06/05/2020 IV iron as indicated    Interim History:  Roger Kent is here today for follow-up. He is doing quit well and excited for his upcoming cruise to [place] and [place].  He is staying busy going to the gym regularly.  Mild swelling in the left leg is unchanged from baseline. He wears his compression stockings daily for added support.  He is taking his full dose aspirin daily.  No blood loss, abnormal bruising, no petechiae.  No fever, chills, n/v, cough, rash, dizziness, SOB, chest pain, palpitations, abdominal pain or changes in bowel or bladder habits.  No falls or syncope reported.  He has a good appetite and states that he and his wife eat healthy. He hydrates well with water throughout the day and weight is stable at 202 lbs.   ECOG Performance Status: 1 - Symptomatic but completely ambulatory  Medications:  Allergies as of 03/01/2024       Reactions   Oxycodone-acetaminophen Other (See Comments)   Other Reaction: agitation   Crestor [rosuvastatin] Other (See Comments)   Muscle pain   Zetia [ezetimibe]    Brain fog, felt sluggish   Lamisil [terbinafine] Rash        Medication List        Accurate as of March 01, 2024  8:50 AM. If you have any questions, ask your nurse or doctor.          azithromycin 250 MG tablet Commonly known as: Zithromax Z-Pak As directed   Bromfenac Sodium 0.07 % Soln Place 1 drop into  both eyes at bedtime.   busPIRone 7.5 MG tablet Commonly known as: BUSPAR TAKE ONE TABLET BY MOUTH TWICE DAILY   cetirizine 10 MG tablet Commonly known as: ZYRTEC Take 10 mg by mouth daily.   CO Q 10 PO Take 300 mg by mouth daily.   COLLAGEN PO Take 1 capsule by mouth daily. collagen hydrolyzed   esomeprazole 20 MG capsule Commonly known as: NexIUM Take 1 capsule (20 mg total) by mouth daily. 30 minutes to an hour before meals   GLUCOSAMINE PO Take 3,000 mg by mouth daily. MSM 1500 mg  1500 mg   HYALURONIC ACID PO Take 2,000 mg by mouth daily. 1000 mg each   MAGNESIUM GLUCONATE PO Take 120 mg by mouth daily.   methocarbamol 500 MG tablet Commonly known as: ROBAXIN Take 1-2 tablets (500-1,000 mg total) by mouth every 6 (six) hours as needed for muscle spasms.   methylPREDNISolone 4 MG Tbpk tablet Commonly known as: MEDROL DOSEPAK As directed   Mucinex DM Maximum Strength 60-1200 MG Tb12 Take 1 tablet by mouth daily.   olmesartan 40 MG tablet Commonly known as: BENICAR TAKE 1 TABLET DAILY   OVER THE COUNTER MEDICATION Take 1 Package by mouth daily. Relief factor  4 tablet each package   OVER THE COUNTER MEDICATION Take 1  tablet by mouth daily. Nitricoxide Niacin   OVER THE COUNTER MEDICATION Take 6 tablets by mouth daily. Super fruit 3 each Super veggie 3 each   OVER THE COUNTER MEDICATION Take 1 Scoop by mouth daily. Orgain organic protein powder Adds/ vital protein collagen peptides 2-4 table spoons   OVER THE COUNTER MEDICATION Take 2 tablets by mouth daily. Super beets chews   OVER THE COUNTER MEDICATION Place 1 application  into both nostrils daily. navage nasal care   Pred Forte 1 % ophthalmic suspension Generic drug: prednisoLONE acetate Place 1 drop into the right eye 2 (two) times daily.   PREVAGEN PO Take 1 capsule by mouth daily. Pro   PROBIOTIC PO Take 1 capsule by mouth daily. 60 billion   Repatha SureClick 140 MG/ML  Soaj Generic drug: Evolocumab INJECT 140MG  INTO THE SKIN ONCE EVERY 14 DAYS   simethicone 125 MG chewable tablet Commonly known as: MYLICON Chew 125 mg by mouth every 6 (six) hours as needed for flatulence.        Allergies:  Allergies  Allergen Reactions   Oxycodone-Acetaminophen Other (See Comments)    Other Reaction: agitation   Crestor [Rosuvastatin] Other (See Comments)    Muscle pain   Zetia [Ezetimibe]     Brain fog, felt sluggish   Lamisil [Terbinafine] Rash    Past Medical History, Surgical history, Social history, and Family History were reviewed and updated.  Review of Systems: All other 10 point review of systems is negative.   Physical Exam:  vitals were not taken for this visit.   Wt Readings from Last 3 Encounters:  01/23/24 205 lb (93 kg)  01/12/24 202 lb 12.8 oz (92 kg)  11/24/23 203 lb (92.1 kg)    Ocular: Sclerae unicteric, pupils equal, round and reactive to light Ear-nose-throat: Oropharynx clear, dentition fair Lymphatic: No cervical or supraclavicular adenopathy Lungs no rales or rhonchi, good excursion bilaterally Heart regular rate and rhythm, no murmur appreciated Abd soft, nontender, positive bowel sounds MSK no focal spinal tenderness, no joint edema Neuro: non-focal, well-oriented, appropriate affect Breasts: Deferred   Lab Results  Component Value Date   WBC 8.8 03/01/2024   HGB 14.8 03/01/2024   HCT 44.6 03/01/2024   MCV 94.3 03/01/2024   PLT 225 03/01/2024   Lab Results  Component Value Date   FERRITIN 52 10/31/2023   IRON 126 10/31/2023   TIBC 357 10/31/2023   UIBC 231 10/31/2023   IRONPCTSAT 35 10/31/2023   Lab Results  Component Value Date   RETICCTPCT 1.4 08/12/2023   RBC 4.73 03/01/2024   No results found for: "KPAFRELGTCHN", "LAMBDASER", "KAPLAMBRATIO" No results found for: "IGGSERUM", "IGA", "IGMSERUM" Lab Results  Component Value Date   ALBUMINELP 4.1 04/16/2022   A1GS 0.3 04/16/2022   A2GS 0.6  04/16/2022   BETS 0.5 04/16/2022   BETA2SER 0.4 04/16/2022   GAMS 1.3 04/16/2022   SPEI  04/16/2022     Comment:     Normal Serum Protein Electrophoresis Pattern. No abnormal protein bands (M-protein) detected.      Chemistry      Component Value Date/Time   NA 134 (L) 10/31/2023 0832   NA 140 05/10/2021 0842   K 4.6 10/31/2023 0832   CL 97 (L) 10/31/2023 0832   CO2 31 10/31/2023 0832   BUN 20 10/31/2023 0832   BUN 15 05/10/2021 0842   CREATININE 1.05 10/31/2023 0832   CREATININE 0.98 08/01/2020 0924      Component Value Date/Time   CALCIUM  9.9 10/31/2023 0832   ALKPHOS 42 10/31/2023 0832   AST 20 10/31/2023 0832   ALT 17 10/31/2023 0832   BILITOT 0.7 10/31/2023 0832       Impression and Plan: Roger Kent is a 78 yo caucasian gentleman with history significant for DVT as well as superficial thrombosis of the right and left lower extremities. Left chronic DVT stable.  He is doing well on full dose aspirin and has had no new thrombotic events.   He will continue his same regimen.  Iron studies are pending.  Follow-up in 6 months.   Kennard Pea, NP 4/14/20258:50 AM

## 2024-03-03 ENCOUNTER — Other Ambulatory Visit: Payer: Self-pay

## 2024-03-03 MED ORDER — OLMESARTAN MEDOXOMIL 40 MG PO TABS: 40.0000 mg | ORAL_TABLET | Freq: Every day | ORAL | 3 refills | Status: DC

## 2024-03-03 NOTE — Telephone Encounter (Signed)
 Called and spoke with a represenative at [facility] as well as Medicare and they informed me that there were no problems present with this script, they only needed a refill. Called and left detailed voice message to relay this information back to the patient. Also informed them that new prescription had been placed.

## 2024-03-12 ENCOUNTER — Ambulatory Visit: Admitting: Family Medicine

## 2024-03-12 ENCOUNTER — Encounter: Payer: Self-pay | Admitting: Family Medicine

## 2024-03-12 VITALS — BP 124/82 | HR 74 | Ht 72.0 in | Wt 201.0 lb

## 2024-03-12 DIAGNOSIS — H66002 Acute suppurative otitis media without spontaneous rupture of ear drum, left ear: Secondary | ICD-10-CM

## 2024-03-12 DIAGNOSIS — H60392 Other infective otitis externa, left ear: Secondary | ICD-10-CM

## 2024-03-12 MED ORDER — NEOMYCIN-POLYMYXIN-HC 3.5-10000-1 OT SUSP: 3.0000 [drp] | Freq: Four times a day (QID) | OTIC | 0 refills | Status: AC

## 2024-03-12 MED ORDER — AMOXICILLIN-POT CLAVULANATE 875-125 MG PO TABS: 1.0000 | ORAL_TABLET | Freq: Two times a day (BID) | ORAL | 0 refills | Status: AC

## 2024-03-12 NOTE — Progress Notes (Signed)
 Acute Office Visit  Subjective:     Patient ID: Roger Kent, male    DOB: Apr 05, 1946, 78 y.o.   MRN: [id number]  Chief Complaint  Patient presents with   Ear Fullness    Pt wear hearing aids, but the left side has been very sore and tender to touch also slight warmth as well. Pt cannot sleep at night and feels as if when he tries to sleep on the right side he can feel some drainage as well     HPI Patient is in today for evaluation of possible ear infection of the L ear. Reports thin, clear discharge from the L ear, outer L ear tenderness, swelling, warmth. Pain radiates down the L neck.  Has not been able to wear L hearing aid r/t discomfort. Has not attempted to treat at home.  Denies abdominal pain, nausea, vomiting, diarrhea, rash, fever, chills, other concerns today.  ROS Per HPI      Objective:    BP 124/82 (BP Location: Left Arm, Patient Position: Sitting, Cuff Size: Normal)   Pulse 74   Ht 6' (1.829 m)   Wt 201 lb (91.2 kg)   BMI 27.26 kg/m    Physical Exam Vitals and nursing note reviewed.  Constitutional:      General: He is not in acute distress.    Comments: Appears fatigued  HENT:     Head: Normocephalic and atraumatic.     Right Ear: Tympanic membrane, ear canal and external ear normal. No drainage, swelling or tenderness.     Left Ear: Drainage, swelling and tenderness present. A middle ear effusion is present. Tympanic membrane is erythematous and bulging.     Ears:     Comments: Wears hearing aids in both ears    Nose: Congestion present.     Mouth/Throat:     Mouth: Mucous membranes are moist.     Pharynx: Oropharynx is clear. No oropharyngeal exudate or posterior oropharyngeal erythema.     Comments: Oropharyngeal cobblestoning   Eyes:     Extraocular Movements: Extraocular movements intact.  Cardiovascular:     Rate and Rhythm: Normal rate.  Pulmonary:     Effort: Pulmonary effort is normal.  Musculoskeletal:     Cervical  back: Normal range of motion.  Lymphadenopathy:     Cervical: Cervical adenopathy (L cervical) present.  Neurological:     General: No focal deficit present.     Mental Status: He is alert and oriented to person, place, and time.     No results found for any visits on 03/12/24.      Assessment & Plan:   Other infective acute otitis externa of left ear -     Neomycin-Polymyxin-HC; Place 3 drops into the left ear 4 (four) times daily for 10 days.  Dispense: 10 mL; Refill: 0  Non-recurrent acute suppurative otitis media of left ear without spontaneous rupture of tympanic membrane -     Amoxicillin -Pot Clavulanate; Take 1 tablet by mouth 2 (two) times daily for 7 days.  Dispense: 14 tablet; Refill: 0     Meds ordered this encounter  Medications   amoxicillin -clavulanate (AUGMENTIN) 875-125 MG tablet    Sig: Take 1 tablet by mouth 2 (two) times daily for 7 days.    Dispense:  14 tablet    Refill:  0   neomycin-polymyxin-hydrocortisone (CORTISPORIN) 3.5-10000-1 OTIC suspension    Sig: Place 3 drops into the left ear 4 (four) times daily for 10 days.  Dispense:  10 mL    Refill:  0    Return if symptoms worsen or fail to improve.  Wellington Half, FNP

## 2024-03-12 NOTE — Patient Instructions (Signed)
 I have sent in Augmentin for you to take twice a day for 10 days.  This medication can upset your stomach, so I tell everyone to take it with a meal.  I have sent in cortisporin for you to use 3 drops, 4 times per day for 5 days.  Follow-up with me for new or worsening symptoms.

## 2024-03-22 MED ORDER — DENOSUMAB 60 MG/ML ~~LOC~~ SOSY
60.0000 mg | PREFILLED_SYRINGE | Freq: Once | SUBCUTANEOUS | Status: AC
Start: 2024-04-05 — End: 2024-02-16
  Administered 2024-02-16: 60 mg via SUBCUTANEOUS

## 2024-03-22 NOTE — Addendum Note (Signed)
 Addended by: [name] on: 03/22/2024 10:17 AM   Modules accepted: Orders

## 2024-03-23 ENCOUNTER — Other Ambulatory Visit: Payer: Self-pay | Admitting: Internal Medicine

## 2024-03-30 ENCOUNTER — Other Ambulatory Visit: Payer: Self-pay | Admitting: *Deleted

## 2024-03-30 DIAGNOSIS — I712 Thoracic aortic aneurysm, without rupture, unspecified: Secondary | ICD-10-CM

## 2024-04-09 ENCOUNTER — Other Ambulatory Visit: Payer: Self-pay | Admitting: Internal Medicine

## 2024-04-26 ENCOUNTER — Telehealth: Payer: Self-pay | Admitting: Cardiology

## 2024-04-26 ENCOUNTER — Ambulatory Visit: Payer: Self-pay | Admitting: Cardiology

## 2024-04-26 ENCOUNTER — Ambulatory Visit (HOSPITAL_COMMUNITY)
Admission: RE | Admit: 2024-04-26 | Discharge: 2024-04-26 | Disposition: A | Discharge status: Home / Self Care | Source: Ambulatory Visit | Attending: Cardiology | Admitting: Cardiology

## 2024-04-26 ENCOUNTER — Telehealth: Payer: Self-pay | Admitting: Internal Medicine

## 2024-04-26 DIAGNOSIS — I2510 Atherosclerotic heart disease of native coronary artery without angina pectoris: Secondary | ICD-10-CM | POA: Diagnosis not present

## 2024-04-26 DIAGNOSIS — I712 Thoracic aortic aneurysm, without rupture, unspecified: Secondary | ICD-10-CM | POA: Diagnosis present

## 2024-04-26 DIAGNOSIS — I700 Atherosclerosis of aorta: Secondary | ICD-10-CM | POA: Diagnosis not present

## 2024-04-26 DIAGNOSIS — N281 Cyst of kidney, acquired: Secondary | ICD-10-CM | POA: Diagnosis not present

## 2024-04-26 DIAGNOSIS — I7121 Aneurysm of the ascending aorta, without rupture: Secondary | ICD-10-CM | POA: Diagnosis not present

## 2024-04-26 DIAGNOSIS — R918 Other nonspecific abnormal finding of lung field: Secondary | ICD-10-CM | POA: Diagnosis not present

## 2024-04-26 MED ORDER — APIXABAN 5 MG PO TABS: 5.0000 mg | ORAL_TABLET | Freq: Two times a day (BID) | ORAL | 3 refills | Status: DC

## 2024-04-26 MED ORDER — IOHEXOL 350 MG/ML SOLN
75.0000 mL | Freq: Once | INTRAVENOUS | Status: AC | PRN
  Administered 2024-04-26: 75 mL via INTRAVENOUS

## 2024-04-26 NOTE — Telephone Encounter (Signed)
 Copied from CRM [id number]. Topic: Clinical - Medical Advice >> Apr 26, 2024  1:25 PM Juleen Oakland F wrote: Reason for CRM: Patient spouse Hazen Liter requested a call back at [phone] regarding blood clot found in patients lungs from chest CT done today. She wants to know if patient should follow up with the same doctor he seen for DVT in his legs?

## 2024-04-26 NOTE — Telephone Encounter (Signed)
 Call from Dr. Christiane Cowing on CTA angio chest aorta ordered for this patient by  Dr. Audery Blazing. Spoke with Dr. Paulita Boss, DOD, who took stat call for critical results. Patient with small PE on right, Dr. Paulita Boss advises patient to start on 5 mg Eliquis BID and to route message to Dr. Audery Blazing and Dr. Leatrice Provost.  Call to patient to advise of small PE on right lung, patient verbalizes understanding and agrees to start eliquis BID. Eliquis sent to pharmacy of choice.

## 2024-04-26 NOTE — Telephone Encounter (Signed)
 Calling with critical findings. Please advise

## 2024-04-28 ENCOUNTER — Other Ambulatory Visit: Payer: Self-pay | Admitting: *Deleted

## 2024-04-28 MED ORDER — APIXABAN 5 MG PO TABS: ORAL_TABLET | ORAL | Status: DC

## 2024-04-28 NOTE — Telephone Encounter (Signed)
 Left message for pt to call.

## 2024-04-28 NOTE — Telephone Encounter (Signed)
 Dr. Audery Blazing has addressed the issue already. Roger Kent was started on Eliquis.  Thank you.

## 2024-04-28 NOTE — Telephone Encounter (Signed)
 Dr. Audery Blazing has addressed the issue already. Bo was started on Eliquis.  Thank you.

## 2024-04-29 NOTE — Telephone Encounter (Signed)
Spoke with pt, Aware of dr crenshaw's recommendations.  Follow up scheduled  

## 2024-04-30 ENCOUNTER — Inpatient Hospital Stay: Attending: Hematology & Oncology | Admitting: Family

## 2024-04-30 ENCOUNTER — Ambulatory Visit (HOSPITAL_BASED_OUTPATIENT_CLINIC_OR_DEPARTMENT_OTHER)
Admission: RE | Admit: 2024-04-30 | Discharge: 2024-04-30 | Disposition: A | Discharge status: Home / Self Care | Source: Ambulatory Visit | Attending: Family | Admitting: Family

## 2024-04-30 ENCOUNTER — Other Ambulatory Visit: Payer: Self-pay | Admitting: *Deleted

## 2024-04-30 ENCOUNTER — Encounter: Payer: Self-pay | Admitting: *Deleted

## 2024-04-30 ENCOUNTER — Encounter: Payer: Self-pay | Admitting: Family

## 2024-04-30 ENCOUNTER — Inpatient Hospital Stay: Attending: Hematology & Oncology

## 2024-04-30 VITALS — BP 143/81 | HR 58 | Temp 97.9°F | Resp 18 | Ht 72.0 in | Wt 196.3 lb

## 2024-04-30 DIAGNOSIS — I82532 Chronic embolism and thrombosis of left popliteal vein: Secondary | ICD-10-CM | POA: Diagnosis not present

## 2024-04-30 DIAGNOSIS — I82451 Acute embolism and thrombosis of right peroneal vein: Secondary | ICD-10-CM | POA: Insufficient documentation

## 2024-04-30 DIAGNOSIS — I82509 Chronic embolism and thrombosis of unspecified deep veins of unspecified lower extremity: Secondary | ICD-10-CM | POA: Insufficient documentation

## 2024-04-30 DIAGNOSIS — Z7901 Long term (current) use of anticoagulants: Secondary | ICD-10-CM | POA: Insufficient documentation

## 2024-04-30 DIAGNOSIS — I82811 Embolism and thrombosis of superficial veins of right lower extremities: Secondary | ICD-10-CM | POA: Diagnosis not present

## 2024-04-30 DIAGNOSIS — D6862 Lupus anticoagulant syndrome: Secondary | ICD-10-CM | POA: Insufficient documentation

## 2024-04-30 DIAGNOSIS — D508 Other iron deficiency anemias: Secondary | ICD-10-CM

## 2024-04-30 DIAGNOSIS — I2699 Other pulmonary embolism without acute cor pulmonale: Secondary | ICD-10-CM | POA: Diagnosis not present

## 2024-04-30 DIAGNOSIS — I712 Thoracic aortic aneurysm, without rupture, unspecified: Secondary | ICD-10-CM

## 2024-04-30 DIAGNOSIS — I82512 Chronic embolism and thrombosis of left femoral vein: Secondary | ICD-10-CM | POA: Diagnosis not present

## 2024-04-30 DIAGNOSIS — D509 Iron deficiency anemia, unspecified: Secondary | ICD-10-CM | POA: Diagnosis not present

## 2024-04-30 DIAGNOSIS — D500 Iron deficiency anemia secondary to blood loss (chronic): Secondary | ICD-10-CM

## 2024-04-30 DIAGNOSIS — I82432 Acute embolism and thrombosis of left popliteal vein: Secondary | ICD-10-CM | POA: Diagnosis not present

## 2024-04-30 LAB — CMP (CANCER CENTER ONLY)
ALT: 16 U/L (ref 0–44)
AST: 18 U/L (ref 15–41)
Albumin: 4.8 g/dL (ref 3.5–5.0)
Alkaline Phosphatase: 32 U/L — ABNORMAL LOW (ref 38–126)
Anion gap: 9 (ref 5–15)
BUN: 22 mg/dL (ref 8–23)
CO2: 27 mmol/L (ref 22–32)
Calcium: 10 mg/dL (ref 8.9–10.3)
Chloride: 104 mmol/L (ref 98–111)
Creatinine: 1.18 mg/dL (ref 0.61–1.24)
GFR, Estimated: >60 mL/min (ref >60)
Glucose, Bld: 99 mg/dL (ref 70–99)
Potassium: 5.1 mmol/L (ref 3.5–5.1)
Sodium: 140 mmol/L (ref 135–145)
Total Bilirubin: 0.9 mg/dL (ref 0.0–1.2)
Total Protein: 8 g/dL (ref 6.5–8.1)

## 2024-04-30 LAB — IRON AND IRON BINDING CAPACITY (CC-WL,HP ONLY)
Iron: 146 ug/dL (ref 45–182)
Saturation Ratios: 42 % — ABNORMAL HIGH (ref 17.9–39.5)
TIBC: 350 ug/dL (ref 250–450)
UIBC: 204 ug/dL (ref 117–376)

## 2024-04-30 LAB — CBC WITH DIFFERENTIAL (CANCER CENTER ONLY)
Abs Immature Granulocytes: 0.02 10*3/uL (ref 0.00–0.07)
Basophils Absolute: 0 10*3/uL (ref 0.0–0.1)
Basophils Relative: 0 %
Eosinophils Absolute: 0.2 10*3/uL (ref 0.0–0.5)
Eosinophils Relative: 2 %
HCT: 45.5 % (ref 39.0–52.0)
Hemoglobin: 15.2 g/dL (ref 13.0–17.0)
Immature Granulocytes: 0 %
Lymphocytes Relative: 32 %
Lymphs Abs: 2.6 10*3/uL (ref 0.7–4.0)
MCH: 30.8 pg (ref 26.0–34.0)
MCHC: 33.4 g/dL (ref 30.0–36.0)
MCV: 92.3 fL (ref 80.0–100.0)
Monocytes Absolute: 0.6 10*3/uL (ref 0.1–1.0)
Monocytes Relative: 7 %
Neutro Abs: 4.7 10*3/uL (ref 1.7–7.7)
Neutrophils Relative %: 59 %
Platelet Count: 220 10*3/uL (ref 150–400)
RBC: 4.93 MIL/uL (ref 4.22–5.81)
RDW: 14 % (ref 11.5–15.5)
WBC Count: 8.2 10*3/uL (ref 4.0–10.5)
nRBC: 0 % (ref 0.0–0.2)

## 2024-04-30 LAB — FERRITIN: Ferritin: 119 ng/mL (ref 24–336)

## 2024-04-30 NOTE — Progress Notes (Signed)
 Call received from Roger Kent in [place]  to notify Dr. Maria Shiner of Venous US  lower bilateral results.  US  results reviewed by Dr. Maria Shiner.  [name] and patient notified to continue Eliquis  as ordered by Dr. Audery Blazing.  Teach back done.  Pt is appreciative of call and has no questions at this time.

## 2024-04-30 NOTE — Progress Notes (Signed)
 Hematology and Oncology Follow Up Visit  Roger Kent [id number] 14-Mar-1946 78 y.o. 04/30/2024   Principle Diagnosis:  New right upper and lower PE - dx 04/26/2024 Chronic DVT left femoral and popliteal veins DVT of the right peroneal vein SVT of the right saphenous vein (+) lupus anti-coagulant Iron deficiency anemia  Past Therapy: Xarelto  20 mg po q day -- 1 yr of therapeutic to complete in 05/2019 Xarelto  10 mg po q day -- maintenance x 108yr -- start 05/2019 -- completed on 06/2020 EC ASA 81 mg po q day -- started on 01/11/2019 -- changed to 325 mg po q day on 06/05/2020   Current Therapy:        Eliquis  5 mg PO BID - lifelong IV iron as indicated    Interim History:  Roger Kent is here today with his wife for follow-up. He was diagnosed with a new PE in the right upper and lower lung lobes. This was discovered on CT angio to reassess his known aortic aneurysm with Dr. Isobel Marie. He was immediately started on Eliquis  and is tolerating nicely.   He has stopped taking his aspirin.  No issue with blood loss. No abnormal bruising or petechiae.  He denies any symptoms of PE.  He has chronic swelling in the left lower leg. Pedal pulses are 1+.  No pitting edema or redness noted in his extremities.  No falls or syncope.  He had recently returned from an extended tour of [place]. Longest flight was between 9-10 hrs and he was not able to move around much.  No fever, chills, n/v, cough, rash, dizziness, SOB, chest pain, palpitations, abdominal pain or changes in bowel or bladder habits.  Appetite and hydration are good. Weight is stable at 196 lbs.   ECOG Performance Status: 1 - Symptomatic but completely ambulatory  Medications:  Allergies as of 04/30/2024       Reactions   Oxycodone -acetaminophen  Other (See Comments)   Other Reaction: agitation   Crestor  [rosuvastatin ] Other (See Comments)   Muscle pain   Zetia  [ezetimibe ]    Brain fog, felt sluggish    Lamisil  [terbinafine ] Rash        Medication List        Accurate as of April 30, 2024 12:03 PM. If you have any questions, ask your nurse or doctor.          apixaban  5 MG Tabs tablet Commonly known as: ELIQUIS  10 mg twice daily for 7 days then reduce to 5 mg twice daily   azithromycin  250 MG tablet Commonly known as: Zithromax  Z-Pak As directed   Bromfenac Sodium 0.07 % Soln Place 1 drop into both eyes at bedtime.   busPIRone  7.5 MG tablet Commonly known as: BUSPAR  TAKE ONE TABLET BY MOUTH TWICE DAILY   cetirizine 10 MG tablet Commonly known as: ZYRTEC Take 10 mg by mouth daily.   CO Q 10 PO Take 300 mg by mouth daily.   COLLAGEN PO Take 1 capsule by mouth daily. collagen hydrolyzed   esomeprazole  20 MG capsule Commonly known as: NEXIUM  TAKE 1 CAPSULE DAILY 30    MINUTES TO AN HOUR BEFORE AMEAL   GLUCOSAMINE PO Take 3,000 mg by mouth daily. MSM 1500 mg  1500 mg   HYALURONIC ACID PO Take 2,000 mg by mouth daily. 1000 mg each   MAGNESIUM  GLUCONATE PO Take 120 mg by mouth daily.   melatonin 3 MG Tabs tablet Take 3 mg by mouth at bedtime as needed.  methocarbamol  500 MG tablet Commonly known as: ROBAXIN  Take 1-2 tablets (500-1,000 mg total) by mouth every 6 (six) hours as needed for muscle spasms.   methylPREDNISolone  4 MG Tbpk tablet Commonly known as: MEDROL  DOSEPAK As directed   Mucinex DM Maximum Strength 60-1200 MG Tb12 Take 1 tablet by mouth daily.   olmesartan  40 MG tablet Commonly known as: BENICAR  Take 1 tablet (40 mg total) by mouth daily.   OVER THE COUNTER MEDICATION Take 1 Package by mouth daily. Relief factor  4 tablet each package   OVER THE COUNTER MEDICATION Take 1 tablet by mouth daily. Nitricoxide Niacin   OVER THE COUNTER MEDICATION Take 6 tablets by mouth daily. Super fruit 3 each Super veggie 3 each   OVER THE COUNTER MEDICATION Take 1 Scoop by mouth daily. Orgain organic protein powder Adds/ vital protein  collagen peptides 2-4 table spoons   OVER THE COUNTER MEDICATION Take 2 tablets by mouth daily. Super beets chews   OVER THE COUNTER MEDICATION Place 1 application  into both nostrils daily. navage nasal care   PREVAGEN PO Take 1 capsule by mouth daily. Pro   PROBIOTIC PO Take 1 capsule by mouth daily. 60 billion   Repatha  SureClick 140 MG/ML Soaj Generic drug: Evolocumab  INJECT 140MG  INTO THE SKIN ONCE EVERY 14 DAYS   simethicone 125 MG chewable tablet Commonly known as: MYLICON Chew 125 mg by mouth every 6 (six) hours as needed for flatulence.        Allergies:  Allergies  Allergen Reactions   Oxycodone -Acetaminophen  Other (See Comments)    Other Reaction: agitation   Crestor  [Rosuvastatin ] Other (See Comments)    Muscle pain   Zetia  [Ezetimibe ]     Brain fog, felt sluggish   Lamisil  [Terbinafine ] Rash    Past Medical History, Surgical history, Social history, and Family History were reviewed and updated.  Review of Systems: All other 10 point review of systems is negative.   Physical Exam:  vitals were not taken for this visit.   Wt Readings from Last 3 Encounters:  03/12/24 201 lb (91.2 kg)  03/01/24 202 lb 12.8 oz (92 kg)  01/23/24 205 lb (93 kg)    Ocular: Sclerae unicteric, pupils equal, round and reactive to light Ear-nose-throat: Oropharynx clear, dentition fair Lymphatic: No cervical or supraclavicular adenopathy Lungs no rales or rhonchi, good excursion bilaterally Heart regular rate and rhythm, no murmur appreciated Abd soft, nontender, positive bowel sounds MSK no focal spinal tenderness, no joint edema Neuro: non-focal, well-oriented, appropriate affect Breasts: Deferred   Lab Results  Component Value Date   WBC 8.8 03/01/2024   HGB 14.8 03/01/2024   HCT 44.6 03/01/2024   MCV 94.3 03/01/2024   PLT 225 03/01/2024   Lab Results  Component Value Date   FERRITIN 43 03/01/2024   IRON 87 03/01/2024   TIBC 342 03/01/2024   UIBC 255  03/01/2024   IRONPCTSAT 26 03/01/2024   Lab Results  Component Value Date   RETICCTPCT 1.4 08/12/2023   RBC 4.73 03/01/2024   No results found for: KPAFRELGTCHN, LAMBDASER, KAPLAMBRATIO No results found for: Rae Bugler, IGMSERUM Lab Results  Component Value Date   ALBUMINELP 4.1 04/16/2022   A1GS 0.3 04/16/2022   A2GS 0.6 04/16/2022   BETS 0.5 04/16/2022   BETA2SER 0.4 04/16/2022   GAMS 1.3 04/16/2022   SPEI  04/16/2022     Comment:     Normal Serum Protein Electrophoresis Pattern. No abnormal protein bands (M-protein) detected.  Chemistry      Component Value Date/Time   NA 139 03/01/2024 0837   NA 140 05/10/2021 0842   K 5.0 03/01/2024 0837   CL 104 03/01/2024 0837   CO2 28 03/01/2024 0837   BUN 21 03/01/2024 0837   BUN 15 05/10/2021 0842   CREATININE 1.05 03/01/2024 0837   CREATININE 0.98 08/01/2020 0924      Component Value Date/Time   CALCIUM  9.7 03/01/2024 0837   ALKPHOS 55 03/01/2024 0837   AST 17 03/01/2024 0837   ALT 12 03/01/2024 0837   BILITOT 0.6 03/01/2024 0837       Impression and Plan: Roger Kent is a 78 yo caucasian gentleman with history significant for DVT as well as superficial thrombosis of the right and left lower extremities. Left chronic DVT stable.  He now has a new PE that occurred while on full dose aspirin.  He is tolerating Eliquis  nicely and will continue the 5 mg PO BID.  We will get an US  of the lower extremities today to rule out new DVT.  Iron studies are pending. We will replace if needed.  Follow-up in 3 months.   Kennard Pea, NP 6/13/202512:03 PM

## 2024-05-03 ENCOUNTER — Encounter: Payer: Self-pay | Admitting: *Deleted

## 2024-05-03 NOTE — Progress Notes (Unsigned)
 Cardiology Office Note:  .   Date:  05/04/2024  ID:  Roger Kent, DOB March 03, 1946, MRN [id number] PCP: Genia Kettering, MD  [facility] Providers Cardiologist:  Alexandria Angel, MD {  History of Present Illness: .   Roger Kent is a 78 y.o. male with history of history of VSD, PE 04/2024, chronic DVT (+lupus anticoagulant), IDA, aortic/iliac aneurysm, coronary calcifications on CT, hypertension, aortic regurgitation, Barrett's esophagus, hyperlipidemia.     VSD Most recent echocardiogram 01/2023 with left-to-right shunt.  Stable preserved biventricular function mild AR.  Aortic sclerosis.  Chronic DVT of the left femoral and popliteal vein SVT bilaterally Remote history of PE  previously on Xarelto  in 2020-2021.  Then transitioned to aspirin. Screening for aortic aneurysm 04/2024 revealed new PE right upper lobe.  No acute findings of right heart strain.  Of note he was on aspirin during this time.  Also this was after a 8+ hour flight Follow-up DVT studies showed new occlusive left popliteal DVT, previously nonocclusive.  No evidence of right lower extremity DVT. On lifelong Eliquis  now  Aortic/iliac aneurysm stable  CTA 04/2024 with stable thoracic aortic aneurysm.  4.3 cm.  Annual imaging recommended. Aortic aneurysm 11/2023 with stable iliac aneurysm.  2.0 cm.  Coronary artery calcifications Noted on CT 04/2019.  CAC 277.  Disease noted in proximal/mid LAD. Normal stress test 04/2019.  Small defect noted, felt to be artifact.  Social history  Very active works out at [employer] every other day doing some fitness and swimming/sauna for 2 hours.  No drugs alcohol or cigarettes.  Married.      Cardiology is following patient for thoracic aortic/iliac aneurysms that has  been stable.  Also with stable known VSD with left-to-right shunting.  Patient has history of positive lupus anticoagulant has chronic left lower extremity DVT now with new PE diagnosis  June 2025 (followed by oncology) with no evidence of right heart strain and new occlusive left popliteal DVT that was previously nonocclusive.  Newly prescribed Eliquis .  Today patient presents for follow-up.  He is in great spirits, very active and enjoys working out at [employer].  He has not been functionally limited whatsoever from his diagnosis of PE, reports he was very shocked to hear about this.  Has not had any shortness of breath, chest pain.  No peripheral edema, tachycardia, palpitations.  Reports compliancy with all of his medications.   ROS: Denies: Chest pain, shortness of breath, orthopnea, peripheral edema, palpitations, decreased exercise intolerance, fatigue, lightheadedness.   Studies Reviewed: Aaron Aas    EKG Interpretation Date/Time:  Tuesday May 04 2024 14:06:02 EDT Ventricular Rate:  69 PR Interval:  190 QRS Duration:  98 QT Interval:  382 QTC Calculation: 409 R Axis:   14  Text Interpretation: Normal sinus rhythm Minimal voltage criteria for LVH, may be normal variant ( R in aVL ) No previous ECGs available Confirmed by Morgan Arab [phone]) on 05/04/2024 2:15:11 PM    Risk Assessment/Calculations:             Physical Exam:   VS:  BP 132/78   Pulse 69   Ht 6' (1.829 m)   Wt 198 lb 12.8 oz (90.2 kg)   SpO2 98%   BMI 26.96 kg/m    Wt Readings from Last 3 Encounters:  05/04/24 198 lb 12.8 oz (90.2 kg)  04/30/24 196 lb 4.8 oz (89 kg)  03/12/24 201 lb (91.2 kg)    GEN: Well nourished, well developed  in no acute distress NECK: No JVD; No carotid bruits CARDIAC: RRR, no murmurs, rubs, gallops RESPIRATORY:  Clear to auscultation without rales, wheezing or rhonchi  ABDOMEN: Soft, non-tender, non-distended EXTREMITIES:  No edema; No deformity   ASSESSMENT AND PLAN: .    Chronic DVT New PE 04/2024 (positive lupus anticoagulant) PE in the setting of prolonged flight and only on aspirin.  Has new PE right upper lobe.  No acute findings of right heart strain. Follow-up  DVT studies showed new occlusive left popliteal DVT, previously nonocclusive.  Overall asymptomatic and still very functional. Obtain echocardiogram to evaluate right heart strain and VSD He was prescribed Eliquis  10 mg twice daily x 7 days and will be transitioning to Eliquis  5 mg twice daily.  Compliant with this regiment.  VSD Stable. Most recent echocardiogram 01/2023 with left-to-right shunt preserved biventricular function mild AR.  Aortic sclerosis. Repeat echocardiogram as above.  Thoracic aortic/iliac aneurysm  Stable  CTA 04/2024 with stable thoracic aortic aneurysm.  4.3 cm.  Annual imaging recommended.  Repeat CTA 04/2025 US  11/2023 with stable iliac aneurysm.  2.0 cm.  Repeat abdominal ultrasound January 2026  Coronary artery calcifications Noted on CT 04/2019.  CAC 277.  Disease noted in proximal/mid LAD.  Asymptomatic with no chest pain. Continue with Repatha .  No aspirin with Eliquis .  Hypertension Decently controlled BP 132/70 today.  Reports has been around 120s to 130s on average. Continue with olmesartan  40 mg daily.  Hyperlipidemia Statin intolerant LDL 54, excellent control but last lipid panel was 06/2023.  Continue Repatha .   Obtain lipid panel today     Dispo: Follow-up in 1 year with Dr. Audery Blazing  Signed, Burnetta Cart, PA-C

## 2024-05-04 ENCOUNTER — Encounter: Payer: Self-pay | Admitting: Physician Assistant

## 2024-05-04 ENCOUNTER — Ambulatory Visit: Attending: Cardiology | Admitting: Cardiology

## 2024-05-04 VITALS — BP 132/78 | HR 69 | Ht 72.0 in | Wt 198.8 lb

## 2024-05-04 DIAGNOSIS — I712 Thoracic aortic aneurysm, without rupture, unspecified: Secondary | ICD-10-CM | POA: Diagnosis not present

## 2024-05-04 DIAGNOSIS — R972 Elevated prostate specific antigen [PSA]: Secondary | ICD-10-CM | POA: Diagnosis not present

## 2024-05-04 DIAGNOSIS — I723 Aneurysm of iliac artery: Secondary | ICD-10-CM | POA: Insufficient documentation

## 2024-05-04 DIAGNOSIS — I2782 Chronic pulmonary embolism: Secondary | ICD-10-CM | POA: Diagnosis not present

## 2024-05-04 DIAGNOSIS — E785 Hyperlipidemia, unspecified: Secondary | ICD-10-CM | POA: Insufficient documentation

## 2024-05-04 NOTE — Patient Instructions (Addendum)
 Medication Instructions:  NO CHANGES *If you need a refill on your cardiac medications before your next appointment, please call your pharmacy*  Lab Work: FASTING LIPID PANEL WITHIN 1 MONTH If you have labs (blood work) drawn today and your tests are completely normal, you will receive your results only by: MyChart Message (if you have MyChart) OR A paper copy in the mail If you have any lab test that is abnormal or we need to change your treatment, we will call you to review the results.  Testing/Procedures:[address] Your physician has requested that you have an echocardiogram. Echocardiography is a painless test that uses sound waves to create images of your heart. It provides your doctor with information about the size and shape of your heart and how well your heart's chambers and valves are working. This procedure takes approximately one hour. There are no restrictions for this procedure. Please do NOT wear cologne, perfume, aftershave, or lotions (deodorant is allowed). Please arrive 15 minutes prior to your appointment time.  Please note: We ask at that you not bring children with you during ultrasound (echo/ vascular) testing. Due to room size and safety concerns, children are not allowed in the ultrasound rooms during exams. Our front office staff cannot provide observation of children in our lobby area while testing is being conducted. An adult accompanying a patient to their appointment will only be allowed in the ultrasound room at the discretion of the ultrasound technician under special circumstances. We apologize for any inconvenience.   Your physician has requested that you have an abdominal aorta duplex. During this test, an ultrasound is used to evaluate the aorta. Allow 30 minutes for this exam. Do not eat after midnight the day before and avoid carbonated beverages.  Please note: We ask at that you not bring children with you during ultrasound (echo/ vascular) testing.  Due to room size and safety concerns, children are not allowed in the ultrasound rooms during exams. Our front office staff cannot provide observation of children in our lobby area while testing is being conducted. An adult accompanying a patient to their appointment will only be allowed in the ultrasound room at the discretion of the ultrasound technician under special circumstances. We apologize for any inconvenience.   IN JUNE 2026  Non-Cardiac CT Angiography (CTA), is a special type of CT scan that uses a computer to produce multi-dimensional views of major blood vessels throughout the body. In CT angiography, a contrast material is injected through an IV to help visualize the blood vessels   Follow-Up: At [facility], you and your health needs are our priority.  As part of our continuing mission to provide you with exceptional heart care, our providers are all part of one team.  This team includes your primary Cardiologist (physician) and Advanced Practice Providers or APPs (Physician Assistants and Nurse Practitioners) who all work together to provide you with the care you need, when you need it.  Your next appointment:   1 year(s)  Provider:   Alexandria Angel, MD

## 2024-05-05 ENCOUNTER — Telehealth: Payer: Self-pay | Admitting: Cardiology

## 2024-05-05 DIAGNOSIS — E785 Hyperlipidemia, unspecified: Secondary | ICD-10-CM | POA: Diagnosis not present

## 2024-05-05 NOTE — Telephone Encounter (Signed)
 I saw patient yesterday at office visit.  Patient handed me a envelope to be given to Dr. Audery Blazing.  Dr. Audery Blazing opened this note and has numerous questions pertaining to the patient's health and asked me to review with patient since I saw.  I attempted to call patient to review these questions as it is not appropriate for me to discuss these things without his approval with his wife.  If patient calls back, please let me know and I would be happy to discuss these concerns with him.

## 2024-05-06 LAB — LIPID PANEL
Chol/HDL Ratio: 2.9 ratio (ref 0.0–5.0)
Cholesterol, Total: 123 mg/dL (ref 100–199)
HDL: 43 mg/dL (ref >39)
LDL Chol Calc (NIH): 66 mg/dL (ref 0–99)
Triglycerides: 64 mg/dL (ref 0–149)
VLDL Cholesterol Cal: 14 mg/dL (ref 5–40)

## 2024-05-09 ENCOUNTER — Ambulatory Visit: Payer: Self-pay | Admitting: Cardiology

## 2024-05-11 DIAGNOSIS — R351 Nocturia: Secondary | ICD-10-CM | POA: Diagnosis not present

## 2024-05-11 DIAGNOSIS — N3281 Overactive bladder: Secondary | ICD-10-CM | POA: Diagnosis not present

## 2024-05-11 DIAGNOSIS — R972 Elevated prostate specific antigen [PSA]: Secondary | ICD-10-CM | POA: Diagnosis not present
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
## 2024-05-18 ENCOUNTER — Other Ambulatory Visit: Payer: Self-pay | Admitting: Cardiology

## 2024-05-18 DIAGNOSIS — I2510 Atherosclerotic heart disease of native coronary artery without angina pectoris: Secondary | ICD-10-CM

## 2024-05-18 DIAGNOSIS — E7800 Pure hypercholesterolemia, unspecified: Secondary | ICD-10-CM

## 2024-05-19 ENCOUNTER — Telehealth: Payer: Self-pay | Admitting: Pharmacy Technician

## 2024-05-19 DIAGNOSIS — D225 Melanocytic nevi of trunk: Secondary | ICD-10-CM | POA: Diagnosis not present

## 2024-05-19 DIAGNOSIS — L821 Other seborrheic keratosis: Secondary | ICD-10-CM | POA: Diagnosis not present

## 2024-05-19 DIAGNOSIS — R229 Localized swelling, mass and lump, unspecified: Secondary | ICD-10-CM | POA: Diagnosis not present

## 2024-05-19 DIAGNOSIS — Z08 Encounter for follow-up examination after completed treatment for malignant neoplasm: Secondary | ICD-10-CM | POA: Diagnosis not present

## 2024-05-19 DIAGNOSIS — Z872 Personal history of diseases of the skin and subcutaneous tissue: Secondary | ICD-10-CM | POA: Diagnosis not present

## 2024-05-19 DIAGNOSIS — L814 Other melanin hyperpigmentation: Secondary | ICD-10-CM | POA: Diagnosis not present

## 2024-05-19 DIAGNOSIS — Z85828 Personal history of other malignant neoplasm of skin: Secondary | ICD-10-CM | POA: Diagnosis not present

## 2024-05-19 DIAGNOSIS — D1722 Benign lipomatous neoplasm of skin and subcutaneous tissue of left arm: Secondary | ICD-10-CM | POA: Diagnosis not present

## 2024-05-19 NOTE — Telephone Encounter (Signed)
 Pharmacy Patient Advocate Encounter  Received notification from [facility] that Prior Authorization for repatha  140mg  has been APPROVED from 02/19/24 to 05/19/25   PA #/Case ID/Reference #:  [id number]    Sent mychart

## 2024-05-19 NOTE — Telephone Encounter (Signed)
 Pharmacy Patient Advocate Encounter   Received notification from CoverMyMeds that prior authorization for repatha  140mg  is required/requested.   Insurance verification completed.   The patient is insured through [facility] .   Per test claim: PA required; PA submitted to above mentioned insurance via CoverMyMeds Key/confirmation #/EOC [id number] Status is pending

## 2024-05-24 ENCOUNTER — Encounter: Payer: Self-pay | Admitting: Cardiology

## 2024-05-24 ENCOUNTER — Telehealth: Payer: Self-pay | Admitting: Pharmacy Technician

## 2024-05-24 NOTE — Telephone Encounter (Signed)
 Called [facility] and 105.00 for 90 days

## 2024-05-24 NOTE — Telephone Encounter (Signed)
 Patient Advocate Encounter   The patient was approved for a [employer] grant that will help cover the cost of repatha  Total amount awarded, 2500.  Effective: 04/24/24 - 04/23/25   APW:[id number] ERW:[id number] Hmnle:[id number] PI:[id number] [employer] ID: [id number]   Pharmacy provided with approval and processing information. Patient informed via mychart

## 2024-05-25 ENCOUNTER — Ambulatory Visit: Payer: Medicare Other | Attending: Rheumatology | Admitting: Rheumatology

## 2024-05-25 ENCOUNTER — Encounter: Payer: Self-pay | Admitting: Rheumatology

## 2024-05-25 VITALS — BP 120/73 | HR 65 | Resp 16 | Ht 72.0 in | Wt 190.6 lb

## 2024-05-25 DIAGNOSIS — R350 Frequency of micturition: Secondary | ICD-10-CM | POA: Insufficient documentation

## 2024-05-25 DIAGNOSIS — Q210 Ventricular septal defect: Secondary | ICD-10-CM | POA: Diagnosis not present

## 2024-05-25 DIAGNOSIS — M1711 Unilateral primary osteoarthritis, right knee: Secondary | ICD-10-CM | POA: Insufficient documentation

## 2024-05-25 DIAGNOSIS — Z96652 Presence of left artificial knee joint: Secondary | ICD-10-CM | POA: Diagnosis not present

## 2024-05-25 DIAGNOSIS — E559 Vitamin D deficiency, unspecified: Secondary | ICD-10-CM | POA: Insufficient documentation

## 2024-05-25 DIAGNOSIS — I2510 Atherosclerotic heart disease of native coronary artery without angina pectoris: Secondary | ICD-10-CM | POA: Diagnosis not present

## 2024-05-25 DIAGNOSIS — N401 Enlarged prostate with lower urinary tract symptoms: Secondary | ICD-10-CM | POA: Insufficient documentation

## 2024-05-25 DIAGNOSIS — G8929 Other chronic pain: Secondary | ICD-10-CM | POA: Insufficient documentation

## 2024-05-25 DIAGNOSIS — M810 Age-related osteoporosis without current pathological fracture: Secondary | ICD-10-CM | POA: Diagnosis not present

## 2024-05-25 DIAGNOSIS — E785 Hyperlipidemia, unspecified: Secondary | ICD-10-CM | POA: Diagnosis not present

## 2024-05-25 DIAGNOSIS — M19042 Primary osteoarthritis, left hand: Secondary | ICD-10-CM | POA: Diagnosis not present

## 2024-05-25 DIAGNOSIS — M19071 Primary osteoarthritis, right ankle and foot: Secondary | ICD-10-CM | POA: Insufficient documentation

## 2024-05-25 DIAGNOSIS — Z8719 Personal history of other diseases of the digestive system: Secondary | ICD-10-CM | POA: Diagnosis not present

## 2024-05-25 DIAGNOSIS — N183 Chronic kidney disease, stage 3 unspecified: Secondary | ICD-10-CM | POA: Insufficient documentation

## 2024-05-25 DIAGNOSIS — M51369 Other intervertebral disc degeneration, lumbar region without mention of lumbar back pain or lower extremity pain: Secondary | ICD-10-CM | POA: Insufficient documentation

## 2024-05-25 DIAGNOSIS — R760 Raised antibody titer: Secondary | ICD-10-CM | POA: Diagnosis not present

## 2024-05-25 DIAGNOSIS — N200 Calculus of kidney: Secondary | ICD-10-CM | POA: Diagnosis present

## 2024-05-25 DIAGNOSIS — Z8619 Personal history of other infectious and parasitic diseases: Secondary | ICD-10-CM | POA: Insufficient documentation

## 2024-05-25 DIAGNOSIS — K2270 Barrett's esophagus without dysplasia: Secondary | ICD-10-CM | POA: Insufficient documentation

## 2024-05-25 DIAGNOSIS — Z86718 Personal history of other venous thrombosis and embolism: Secondary | ICD-10-CM | POA: Diagnosis not present

## 2024-05-25 DIAGNOSIS — M19041 Primary osteoarthritis, right hand: Secondary | ICD-10-CM | POA: Diagnosis not present

## 2024-05-25 DIAGNOSIS — M25552 Pain in left hip: Secondary | ICD-10-CM

## 2024-05-25 DIAGNOSIS — Z86711 Personal history of pulmonary embolism: Secondary | ICD-10-CM | POA: Diagnosis not present

## 2024-05-25 DIAGNOSIS — M25512 Pain in left shoulder: Secondary | ICD-10-CM | POA: Diagnosis not present

## 2024-05-25 DIAGNOSIS — M542 Cervicalgia: Secondary | ICD-10-CM | POA: Diagnosis not present

## 2024-05-25 DIAGNOSIS — M19072 Primary osteoarthritis, left ankle and foot: Secondary | ICD-10-CM | POA: Insufficient documentation

## 2024-05-25 DIAGNOSIS — I2583 Coronary atherosclerosis due to lipid rich plaque: Secondary | ICD-10-CM | POA: Diagnosis not present

## 2024-05-25 DIAGNOSIS — M4156 Other secondary scoliosis, lumbar region: Secondary | ICD-10-CM | POA: Diagnosis not present

## 2024-05-25 DIAGNOSIS — G629 Polyneuropathy, unspecified: Secondary | ICD-10-CM | POA: Insufficient documentation

## 2024-05-25 DIAGNOSIS — I10 Essential (primary) hypertension: Secondary | ICD-10-CM | POA: Diagnosis not present

## 2024-05-27 ENCOUNTER — Ambulatory Visit: Payer: Self-pay | Admitting: Rheumatology

## 2024-05-27 LAB — SJOGRENS SYNDROME-A EXTRACTABLE NUCLEAR ANTIBODY: SSA (Ro) (ENA) Antibody, IgG: <1 AI

## 2024-05-27 LAB — ANTI-SMITH ANTIBODY: ENA SM Ab Ser-aCnc: <1 AI

## 2024-05-27 LAB — PROTEIN / CREATININE RATIO, URINE
Creatinine, Urine: 98 mg/dL (ref 20–320)
Protein/Creat Ratio: 133 mg/g{creat} (ref 25–148)
Protein/Creatinine Ratio: 0.133 mg/mg{creat} (ref 0.025–0.148)
Total Protein, Urine: 13 mg/dL (ref 5–25)

## 2024-05-27 LAB — C3 AND C4
C3 Complement: 119 mg/dL (ref 82–185)
C4 Complement: 17 mg/dL (ref 15–53)

## 2024-05-27 LAB — SJOGRENS SYNDROME-B EXTRACTABLE NUCLEAR ANTIBODY: SSB (La) (ENA) Antibody, IgG: <1 AI

## 2024-05-27 LAB — BETA-2 GLYCOPROTEIN ANTIBODIES
Beta-2 Glyco 1 IgA: <2 U/mL (ref <20.0)
Beta-2 Glyco 1 IgM: <2 U/mL (ref <20.0)
Beta-2 Glyco I IgG: <2 U/mL (ref <20.0)

## 2024-05-27 LAB — ANA: Anti Nuclear Antibody (ANA): NEGATIVE

## 2024-05-27 LAB — CARDIOLIPIN ANTIBODIES, IGG, IGM, IGA
Anticardiolipin IgA: <2 [APL'U]/mL (ref <20.0)
Anticardiolipin IgG: <2 [GPL'U]/mL (ref <20.0)
Anticardiolipin IgM: <2 [MPL'U]/mL (ref <20.0)

## 2024-05-27 LAB — RNP ANTIBODY: Ribonucleic Protein(ENA) Antibody, IgG: <1 AI

## 2024-05-27 LAB — VITAMIN D 25 HYDROXY (VIT D DEFICIENCY, FRACTURES): Vit D, 25-Hydroxy: 72 ng/mL (ref 30–100)

## 2024-05-27 LAB — ANTI-SCLERODERMA ANTIBODY: Scleroderma (Scl-70) (ENA) Antibody, IgG: <1 AI

## 2024-05-27 LAB — ANTI-DNA ANTIBODY, DOUBLE-STRANDED: ds DNA Ab: <1 [IU]/mL

## 2024-05-27 NOTE — Progress Notes (Signed)
 Urine protein creatinine ratio normal, ANA negative, ENA panel negative, anticardiolipin negative, beta-2  GP 1 negative, C3-C4 normal, vitamin D  normal.  All the labs for autoimmune disease were negative.

## 2024-06-14 ENCOUNTER — Ambulatory Visit (HOSPITAL_BASED_OUTPATIENT_CLINIC_OR_DEPARTMENT_OTHER)
Admission: RE | Admit: 2024-06-14 | Discharge: 2024-06-14 | Disposition: A | Discharge status: Home / Self Care | Source: Ambulatory Visit | Attending: Cardiology

## 2024-06-14 ENCOUNTER — Ambulatory Visit (HOSPITAL_COMMUNITY)
Admission: RE | Admit: 2024-06-14 | Discharge: 2024-06-14 | Disposition: A | Discharge status: Home / Self Care | Source: Ambulatory Visit | Attending: Cardiology | Admitting: Cardiology

## 2024-06-14 DIAGNOSIS — I723 Aneurysm of iliac artery: Secondary | ICD-10-CM

## 2024-06-14 DIAGNOSIS — I2782 Chronic pulmonary embolism: Secondary | ICD-10-CM | POA: Insufficient documentation

## 2024-06-14 LAB — ECHOCARDIOGRAM COMPLETE
AR max vel: 1.17 cm2
AV Area VTI: 1.2 cm2
AV Area mean vel: 1.15 cm2
AV Mean grad: 6 mmHg
AV Peak grad: 12 mmHg
Ao pk vel: 1.73 m/s
Area-P 1/2: 2.91 cm2
MV M vel: 3.97 m/s
MV Peak grad: 63 mmHg
MV VTI: 0.37 cm2
P 1/2 time: 526 ms
Radius: 0.41 cm
S' Lateral: 2.74 cm

## 2024-06-16 ENCOUNTER — Telehealth: Payer: Self-pay | Admitting: Cardiology

## 2024-06-16 NOTE — Telephone Encounter (Signed)
 Pt requesting a c/b in regards to results.

## 2024-06-16 NOTE — Telephone Encounter (Signed)
 Message sent to [facility] PA.

## 2024-06-16 NOTE — Telephone Encounter (Signed)
  Pt is returning call to get echo and vascular result

## 2024-06-22 ENCOUNTER — Other Ambulatory Visit: Payer: Self-pay | Admitting: *Deleted

## 2024-06-22 ENCOUNTER — Other Ambulatory Visit: Payer: Self-pay

## 2024-06-22 ENCOUNTER — Telehealth: Payer: Self-pay

## 2024-06-22 DIAGNOSIS — Z01812 Encounter for preprocedural laboratory examination: Secondary | ICD-10-CM

## 2024-06-22 DIAGNOSIS — I340 Nonrheumatic mitral (valve) insufficiency: Secondary | ICD-10-CM

## 2024-06-22 DIAGNOSIS — I723 Aneurysm of iliac artery: Secondary | ICD-10-CM

## 2024-06-22 NOTE — Telephone Encounter (Signed)
 Patient is following up requesting to schedule TEE.

## 2024-06-22 NOTE — Telephone Encounter (Signed)
 Called to schedule TEE for the patient and was able to make an appointment with Dr. Delford on 06/25/24 at 12 pm. Case number is [id number]. Patient was called to be made aware and instructions were reviewed. Patient is also agreed to a nurse visit for tomorrow (06/23/24) at 2 pm to get his EKG done and blood work.      Dear Lamar Agent Gordy  You are scheduled for a TEE (Transesophageal Echocardiogram) on Friday, August 8 with Dr. Delford. Please arrive at the [facility] (Main Entrance A) at [facility]: [address] [place], [place] [address] at 11:00 AM (This time is 1 hour before your procedure to ensure your preparation).   Free valet parking service is available. You will check in at ADMITTING.   *Please Note: You will receive a call the day before your procedure to confirm the appointment time. That time may have changed from the original time based on the schedule for that day.*   DIET:  Nothing to eat or drink after midnight except a sip of water  with medications (see medication instructions below)  MEDICATION INSTRUCTIONS: !!IF ANY NEW MEDICATIONS ARE STARTED AFTER TODAY, PLEASE NOTIFY YOUR PROVIDER AS SOON AS POSSIBLE!!  FYI: Medications such as Semaglutide (Ozempic, [place]), Tirzepatide (Mounjaro, Zepbound), Dulaglutide (Trulicity), etc (GLP1 agonists) AND Canagliflozin (Invokana), Dapagliflozin (Farxiga), Empagliflozin (Jardiance), Ertugliflozin (Steglatro), Bexagliflozin [employer]) or any combination with one of these drugs such as Invokamet (Canagliflozin/Metformin), Synjardy (Empagliflozin/Metformin), etc (SGLT2 inhibitors) must be held around the time of a procedure. This is not a comprehensive list of all of these drugs. Please review all of your medications and talk to your provider if you take any one of these. If you are not sure, ask your provider.   Continue taking your anticoagulant (blood thinner): Apixaban  (Eliquis ).  You will need to continue this after  your procedure until you are told by your provider that it is safe to stop.    LABS:  Come to the lab at the [facility] ([address] [place], 1st Floor) between the hours of 8:00 am and 4:30 pm. You do NOT have to be fasting.  FYI:  For your safety, and to allow us  to monitor your vital signs accurately during the surgery/procedure we request: If you have artificial nails, gel coating, SNS etc, please have those removed prior to your surgery/procedure. Not having the nail coverings /polish removed may result in cancellation or delay of your surgery/procedure.  Your support person will be asked to wait in the waiting room during your procedure.  It is OK to have someone drop you off and come back when you are ready to be discharged.  You cannot drive after the procedure and will need someone to drive you home.  Bring your insurance cards.  *Special Note: Every effort is made to have your procedure done on time. Occasionally there are emergencies that occur at the hospital that may cause delays. Please be patient if a delay does occur.

## 2024-06-22 NOTE — Progress Notes (Signed)
 Patient was called to schedule TEE and labs were ordered.

## 2024-06-23 ENCOUNTER — Ambulatory Visit: Attending: Cardiology

## 2024-06-23 VITALS — HR 70 | Ht 72.0 in | Wt 197.4 lb

## 2024-06-23 DIAGNOSIS — I340 Nonrheumatic mitral (valve) insufficiency: Secondary | ICD-10-CM | POA: Insufficient documentation

## 2024-06-23 DIAGNOSIS — Z01812 Encounter for preprocedural laboratory examination: Secondary | ICD-10-CM | POA: Diagnosis not present

## 2024-06-23 NOTE — Progress Notes (Signed)
   Nurse Visit   Date of Encounter: 06/23/2024 ID: Jacquese Hackman Crooke, DOB 16-Jul-1946, MRN [id number]  PCP:  Garald Karlynn GAILS, MD   [facility] Providers Cardiologist:  Redell Shallow, MD      Visit Details   VS:  There were no vitals taken for this visit. , BMI There is no height or weight on file to calculate BMI.  Wt Readings from Last 3 Encounters:  05/25/24 190 lb 9.6 oz (86.5 kg)  05/04/24 198 lb 12.8 oz (90.2 kg)  04/30/24 196 lb 4.8 oz (89 kg)     Reason for visit: EKG; post TEE Performed today: Vitals, EKG, Provider consulted:Dr. Elmira, and Education Changes (medications, testing, etc.) : No changes, TEE as scheduled  Length of Visit: 20 minutes    Medications Adjustments/Labs and Tests Ordered: Orders Placed This Encounter  Procedures   EKG 12-Lead   No orders of the defined types were placed in this encounter.    Signed, Hamp LOISE Norrie, RN  06/23/2024 1:37 PM

## 2024-06-23 NOTE — Patient Instructions (Addendum)
 Medication Instructions:  Your physician recommends that you continue on your current medications as directed. Please refer to the Current Medication list given to you today.  *If you need a refill on your cardiac medications before your next appointment, please call your pharmacy*  Lab Work: NONE  If you have labs (blood work) drawn today and your tests are completely normal, you will receive your results only by: MyChart Message (if you have MyChart) OR A paper copy in the mail If you have any lab test that is abnormal or we need to change your treatment, we will call you to review the results.  Testing/Procedures: NONE    Follow-Up:As scheduled  At [facility], you and your health needs are our priority.  As part of our continuing mission to provide you with exceptional heart care, our providers are all part of one team.  This team includes your primary Cardiologist (physician) and Advanced Practice Providers or APPs (Physician Assistants and Nurse Practitioners) who all work together to provide you with the care you need, when you need it.

## 2024-06-24 ENCOUNTER — Ambulatory Visit: Payer: Self-pay | Admitting: Cardiology

## 2024-06-24 LAB — BASIC METABOLIC PANEL WITH GFR
BUN/Creatinine Ratio: 18 (ref 10–24)
BUN: 19 mg/dL (ref 8–27)
CO2: 22 mmol/L (ref 20–29)
Calcium: 9.3 mg/dL (ref 8.6–10.2)
Chloride: 98 mmol/L (ref 96–106)
Creatinine, Ser: 1.06 mg/dL (ref 0.76–1.27)
Glucose: 71 mg/dL (ref 70–99)
Potassium: 4.6 mmol/L (ref 3.5–5.2)
Sodium: 136 mmol/L (ref 134–144)
eGFR: 72 mL/min/1.73 (ref >59)

## 2024-06-24 LAB — CBC
Hematocrit: 46.4 % (ref 37.5–51.0)
Hemoglobin: 14.8 g/dL (ref 13.0–17.7)
MCH: 31.2 pg (ref 26.6–33.0)
MCHC: 31.9 g/dL (ref 31.5–35.7)
MCV: 98 fL — ABNORMAL HIGH (ref 79–97)
Platelets: 236 x10E3/uL (ref 150–450)
RBC: 4.74 x10E6/uL (ref 4.14–5.80)
RDW: 13.1 % (ref 11.6–15.4)
WBC: 8.8 x10E3/uL (ref 3.4–10.8)

## 2024-06-24 NOTE — H&P (Signed)
 Cardiology Office Note:  .   Date:  05/04/2024  ID:  Roger Kent, DOB [birth year], MRN [id number] PCP: Garald Karlynn GAILS, MD  [facility] Providers Cardiologist:  Redell Shallow, MD   History of Present Illness: .   Roger Kent is a 78 y.o. male with history of history of VSD, PE 04/2024, chronic DVT (+lupus anticoagulant), IDA, aortic/iliac aneurysm, coronary calcifications on CT, hypertension, aortic regurgitation, Barrett's esophagus, hyperlipidemia.   Narrative History VSD Most recent echocardiogram 01/2023 with left-to-right shunt.  Stable preserved biventricular function mild AR.  Aortic sclerosis.   Chronic DVT of the left femoral and popliteal vein SVT bilaterally Remote history of PE  previously on Xarelto  in 2020-2021.  Then transitioned to aspirin. Screening for aortic aneurysm 04/2024 revealed new PE right upper lobe.  No acute findings of right heart strain.  Of note he was on aspirin during this time.  Also this was after a 8+ hour flight Follow-up DVT studies showed new occlusive left popliteal DVT, previously nonocclusive.  No evidence of right lower extremity DVT. On lifelong Eliquis  now   Aortic/iliac aneurysm stable  CTA 04/2024 with stable thoracic aortic aneurysm.  4.3 cm.  Annual imaging recommended. Aortic aneurysm 11/2023 with stable iliac aneurysm.  2.0 cm.   Coronary artery calcifications Noted on CT 04/2019.  CAC 277.  Disease noted in proximal/mid LAD. Normal stress test 04/2019.  Small defect noted, felt to be artifact.   Social history  Very active works out at [employer] every other day doing some fitness and swimming/sauna for 2 hours.  No drugs alcohol or cigarettes.  Married.         Cardiology is following patient for thoracic aortic/iliac aneurysms that has  been stable.  Also with stable known VSD with left-to-right shunting.  Patient has history of positive lupus anticoagulant has chronic left lower extremity DVT  now with new PE diagnosis June 2025 (followed by oncology) with no evidence of right heart strain and new occlusive left popliteal DVT that was previously nonocclusive.  Newly prescribed Eliquis .   Today patient presents for follow-up.  He is in great spirits, very active and enjoys working out at [employer].  He has not been functionally limited whatsoever from his diagnosis of PE, reports he was very shocked to hear about this.  Has not had any shortness of breath, chest pain.  No peripheral edema, tachycardia, palpitations.  Reports compliancy with all of his medications.     ROS: Denies: Chest pain, shortness of breath, orthopnea, peripheral edema, palpitations, decreased exercise intolerance, fatigue, lightheadedness.    Studies Reviewed: SABRA     EKG Interpretation Date/Time:                  Tuesday May 04 2024 14:06:02 EDT Ventricular Rate:         69 PR Interval:                 190 QRS Duration:             98 QT Interval:                 382 QTC Calculation:409 R Axis:                         14   Text Interpretation:Normal sinus rhythm Minimal voltage criteria for LVH, may be normal variant ( R in aVL ) No previous ECGs available Confirmed by Darryle,  Thom [phone]) on 05/04/2024 2:15:11 PM     Risk Assessment/Calculations:             Physical Exam:   VS:  BP 132/78   Pulse 69   Ht 6' (1.829 m)   Wt 198 lb 12.8 oz (90.2 kg)   SpO2 98%   BMI 26.96 kg/m       Wt Readings from Last 3 Encounters:  05/04/24 198 lb 12.8 oz (90.2 kg)  04/30/24 196 lb 4.8 oz (89 kg)  03/12/24 201 lb (91.2 kg)    GEN: Well nourished, well developed in no acute distress NECK: No JVD; No carotid bruits CARDIAC: RRR, no murmurs, rubs, gallops RESPIRATORY:  Clear to auscultation without rales, wheezing or rhonchi  ABDOMEN: Soft, non-tender, non-distended EXTREMITIES:  No edema; No deformity    ASSESSMENT AND PLAN: .     Chronic DVT New PE 04/2024 (positive lupus anticoagulant) PE in the setting  of prolonged flight and only on aspirin.  Has new PE right upper lobe.  No acute findings of right heart strain. Follow-up DVT studies showed new occlusive left popliteal DVT, previously nonocclusive.  Overall asymptomatic and still very functional. Obtain echocardiogram to evaluate right heart strain and VSD He was prescribed Eliquis  10 mg twice daily x 7 days and will be transitioning to Eliquis  5 mg twice daily.  Compliant with this regiment.   VSD Stable. Most recent echocardiogram 01/2023 with left-to-right shunt preserved biventricular function mild AR.  Aortic sclerosis. Repeat echocardiogram as above.   Thoracic aortic/iliac aneurysm  Stable  CTA 04/2024 with stable thoracic aortic aneurysm.  4.3 cm.  Annual imaging recommended.  Repeat CTA 04/2025 US  11/2023 with stable iliac aneurysm.  2.0 cm.  Repeat abdominal ultrasound January 2026   Coronary artery calcifications Noted on CT 04/2019.  CAC 277.  Disease noted in proximal/mid LAD.  Asymptomatic with no chest pain. Continue with Repatha .  No aspirin with Eliquis .   Hypertension Decently controlled BP 132/70 today.  Reports has been around 120s to 130s on average. Continue with olmesartan  40 mg daily.   Hyperlipidemia Statin intolerant LDL 54, excellent control but last lipid panel was 06/2023.  Continue Repatha .   Obtain lipid panel today ________________________________________________________  06/24/2024  Addendum: Echocardiogram initially ordered to evaluate right heart strain given recent DVT.  The echocardiogram had shown preserved biventricular function however with new moderate to severe MR.  Also with functional bicuspid (fusion of RCC and LCC).  Small restrictive perimembranous VSD with left-to-right shunting.  Case was discussed with Dr. Pietro who had recommended proceeding with TEE.  Informed Consent   Shared Decision Making/Informed Consent{    The risks [esophageal damage, perforation (1:10,000 risk), bleeding,  pharyngeal hematoma as well as other potential complications associated with conscious sedation including aspiration, arrhythmia, respiratory failure and death], benefits (treatment guidance and diagnostic support) and alternatives of a transesophageal echocardiogram were discussed in detail with Roger Kent and he is willing to proceed.        For questions or updates, please contact [facility] Please consult [email or web address] for contact info under     Signed, Thom LITTIE Sluder, PA-C  06/24/2024 1:35 PM

## 2024-06-24 NOTE — Progress Notes (Signed)
 Pt called for pre procedure instructions. Arrival time 1030 NPO after midnight explained Instructed to take am meds with sip of water  Instructed pt need for ride home tomorrow and have responsible adult with them for 24 hrs post procedure.

## 2024-06-25 ENCOUNTER — Ambulatory Visit (HOSPITAL_COMMUNITY): Admitting: Anesthesiology

## 2024-06-25 ENCOUNTER — Other Ambulatory Visit: Payer: Self-pay

## 2024-06-25 ENCOUNTER — Ambulatory Visit (HOSPITAL_COMMUNITY)

## 2024-06-25 ENCOUNTER — Encounter (HOSPITAL_COMMUNITY)
Admission: RE | Disposition: A | Discharge status: Home / Self Care | Payer: Self-pay | Source: Home / Self Care | Attending: Cardiovascular Disease

## 2024-06-25 ENCOUNTER — Ambulatory Visit (HOSPITAL_COMMUNITY)
Admission: RE | Admit: 2024-06-25 | Discharge: 2024-06-25 | Disposition: A | Discharge status: Home / Self Care | Attending: Cardiovascular Disease | Admitting: Cardiovascular Disease

## 2024-06-25 ENCOUNTER — Encounter (HOSPITAL_COMMUNITY): Payer: Self-pay | Admitting: Cardiovascular Disease

## 2024-06-25 DIAGNOSIS — Q210 Ventricular septal defect: Secondary | ICD-10-CM | POA: Diagnosis not present

## 2024-06-25 DIAGNOSIS — I088 Other rheumatic multiple valve diseases: Secondary | ICD-10-CM | POA: Insufficient documentation

## 2024-06-25 DIAGNOSIS — I340 Nonrheumatic mitral (valve) insufficiency: Secondary | ICD-10-CM

## 2024-06-25 DIAGNOSIS — I82502 Chronic embolism and thrombosis of unspecified deep veins of left lower extremity: Secondary | ICD-10-CM | POA: Insufficient documentation

## 2024-06-25 DIAGNOSIS — Z7901 Long term (current) use of anticoagulants: Secondary | ICD-10-CM | POA: Diagnosis not present

## 2024-06-25 DIAGNOSIS — E785 Hyperlipidemia, unspecified: Secondary | ICD-10-CM | POA: Insufficient documentation

## 2024-06-25 DIAGNOSIS — I712 Thoracic aortic aneurysm, without rupture, unspecified: Secondary | ICD-10-CM | POA: Diagnosis not present

## 2024-06-25 DIAGNOSIS — I10 Essential (primary) hypertension: Secondary | ICD-10-CM | POA: Insufficient documentation

## 2024-06-25 DIAGNOSIS — I2510 Atherosclerotic heart disease of native coronary artery without angina pectoris: Secondary | ICD-10-CM

## 2024-06-25 DIAGNOSIS — Z86711 Personal history of pulmonary embolism: Secondary | ICD-10-CM | POA: Insufficient documentation

## 2024-06-25 DIAGNOSIS — Z87891 Personal history of nicotine dependence: Secondary | ICD-10-CM

## 2024-06-25 DIAGNOSIS — Z79899 Other long term (current) drug therapy: Secondary | ICD-10-CM | POA: Diagnosis not present

## 2024-06-25 HISTORY — PX: TRANSESOPHAGEAL ECHOCARDIOGRAM (CATH LAB): EP1270

## 2024-06-25 LAB — ECHO TEE

## 2024-06-25 SURGERY — TRANSESOPHAGEAL ECHOCARDIOGRAM (TEE) (CATHLAB)
Anesthesia: Monitor Anesthesia Care

## 2024-06-25 MED ORDER — PHENYLEPHRINE HCL-NACL 20-0.9 MG/250ML-% IV SOLN
INTRAVENOUS | Status: DC | PRN
  Administered 2024-06-25: 15 ug/min via INTRAVENOUS

## 2024-06-25 MED ORDER — LIDOCAINE 2% (20 MG/ML) 5 ML SYRINGE
INTRAMUSCULAR | Status: DC | PRN
  Administered 2024-06-25: 80 mg via INTRAVENOUS

## 2024-06-25 MED ORDER — SODIUM CHLORIDE 0.9 % IV SOLN: INTRAVENOUS | Status: DC

## 2024-06-25 MED ORDER — PROPOFOL 10 MG/ML IV BOLUS
INTRAVENOUS | Status: DC | PRN
  Administered 2024-06-25 (×2): 20 mg via INTRAVENOUS
  Administered 2024-06-25: 30 mg via INTRAVENOUS

## 2024-06-25 MED ORDER — EPHEDRINE SULFATE-NACL 50-0.9 MG/10ML-% IV SOSY
PREFILLED_SYRINGE | INTRAVENOUS | Status: DC | PRN
  Administered 2024-06-25: 5 mg via INTRAVENOUS

## 2024-06-25 MED ORDER — PROPOFOL 500 MG/50ML IV EMUL
INTRAVENOUS | Status: DC | PRN
  Administered 2024-06-25: 125 ug/kg/min via INTRAVENOUS

## 2024-06-25 NOTE — CV Procedure (Signed)
 TEE Anesthesia Propofol  Technically challenging images. Did not attempt Qp/Qs due to AR/MR  EF 60% No effusion Mild ascending aorta dilatation Mild AR 0.7 mm membranous VSD with only left to right shunting Moderate at most MR.  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 Unable to due PISA/RV/RF due to eccentricity of jet

## 2024-06-25 NOTE — Transfer of Care (Signed)
 Immediate Anesthesia Transfer of Care Note  Patient: Roger Kent  Procedure(s) Performed: TRANSESOPHAGEAL ECHOCARDIOGRAM  Patient Location: Cath Lab  Anesthesia Type:MAC  Level of Consciousness: sedated and drowsy  Airway & Oxygen Therapy: Patient Spontanous Breathing and Patient connected to nasal cannula oxygen  Post-op Assessment: Report given to RN and Post -op Vital signs reviewed and stable  Post vital signs: Reviewed and stable  Last Vitals:  Vitals Value Taken Time  BP 92/63 06/25/24 12:22  Temp 36.3 C 06/25/24 12:21  Pulse 59 06/25/24 12:26  Resp 26 06/25/24 12:26  SpO2 95 % 06/25/24 12:26  Vitals shown include unfiled device data.  Last Pain:  Vitals:   06/25/24 1221  TempSrc: Tympanic  PainSc: Asleep         Complications: No notable events documented.

## 2024-06-25 NOTE — Discharge Instructions (Signed)

## 2024-06-25 NOTE — Anesthesia Preprocedure Evaluation (Addendum)
 Anesthesia Evaluation  Patient identified by MRN, date of birth, ID band Patient awake    Reviewed: Allergy & Precautions, NPO status , Patient's Chart, lab work & pertinent test results  Airway Mallampati: II  TM Distance: >3 FB Neck ROM: Full    Dental  (+) Dental Advisory Given   Pulmonary neg shortness of breath, neg sleep apnea, neg COPD, neg recent URI, former smoker   breath sounds clear to auscultation       Cardiovascular hypertension, + CAD, + Peripheral Vascular Disease and + DVT  + Valvular Problems/Murmurs MR  Rhythm:Regular  1. Left ventricular ejection fraction, by estimation, is 60 to 65%. The  left ventricle has normal function. The left ventricle has no regional  wall motion abnormalities. There is mild left ventricular hypertrophy.  Left ventricular diastolic parameters  are indeterminate.   2. Right ventricular systolic function is normal. The right ventricular  size is normal. There is normal pulmonary artery systolic pressure. The  estimated right ventricular systolic pressure is 14.6 mmHg.   3. Left atrial size was mildly dilated.   4. The mitral valve is degenerative. Moderate to severe mitral valve  regurgitation. No evidence of mitral stenosis.   5. Native aortic valve, functional bicuspid (fusion of RCC and LCC). .  Aortic valve regurgitation is trivial. No aortic stenosis is present.   6. The inferior vena cava is normal in size with greater than 50%  respiratory variability, suggesting right atrial pressure of 3 mmHg.   7. Small restrictive perimembranous VSD; max velocity 4.20m/s, L to R  shunt.      Neuro/Psych neg Seizures PSYCHIATRIC DISORDERS Anxiety      Neuromuscular disease    GI/Hepatic hiatal hernia,GERD  ,,(+) Hepatitis -, C  Endo/Other  negative endocrine ROS    Renal/GU Lab Results      Component                Value               Date                      NA                        136                 06/23/2024                K                        4.6                 06/23/2024                CO2                      22                  06/23/2024                GLUCOSE                  71                  06/23/2024                BUN  19                  06/23/2024                CREATININE               1.06                06/23/2024                CALCIUM                   9.3                 06/23/2024                GFR                      68.61               05/01/2023                EGFR                     72                  06/23/2024                GFRNONAA                 >60                 04/30/2024                Musculoskeletal  (+) Arthritis ,    Abdominal   Peds  Hematology   Anesthesia Other Findings   Reproductive/Obstetrics                              Anesthesia Physical Anesthesia Plan  ASA: 3  Anesthesia Plan: MAC   Post-op Pain Management:    Induction: Intravenous  PONV Risk Score and Plan: 1 and Propofol  infusion and Treatment may vary due to age or medical condition  Airway Management Planned: Nasal Cannula, Natural Airway and Simple Face Mask  Additional Equipment: None  Intra-op Plan:   Post-operative Plan:   Informed Consent: I have reviewed the patients History and Physical, chart, labs and discussed the procedure including the risks, benefits and alternatives for the proposed anesthesia with the patient or authorized representative who has indicated his/her understanding and acceptance.     Dental advisory given  Plan Discussed with: CRNA  Anesthesia Plan Comments:         Anesthesia Quick Evaluation

## 2024-06-30 NOTE — Anesthesia Postprocedure Evaluation (Signed)
 Anesthesia Post Note  Patient: Roger Kent  Procedure(s) Performed: TRANSESOPHAGEAL ECHOCARDIOGRAM     Patient location during evaluation: Cath Lab Anesthesia Type: MAC Level of consciousness: awake and alert Pain management: pain level controlled Vital Signs Assessment: post-procedure vital signs reviewed and stable Respiratory status: spontaneous breathing, nonlabored ventilation and respiratory function stable Cardiovascular status: stable and blood pressure returned to baseline Postop Assessment: no apparent nausea or vomiting Anesthetic complications: no   No notable events documented.                 Grisela Mesch

## 2024-07-05 ENCOUNTER — Other Ambulatory Visit: Payer: Self-pay | Admitting: Family

## 2024-07-05 DIAGNOSIS — I2699 Other pulmonary embolism without acute cor pulmonale: Secondary | ICD-10-CM

## 2024-07-05 DIAGNOSIS — I82509 Chronic embolism and thrombosis of unspecified deep veins of unspecified lower extremity: Secondary | ICD-10-CM

## 2024-07-05 MED ORDER — APIXABAN 5 MG PO TABS: 5.0000 mg | ORAL_TABLET | Freq: Two times a day (BID) | ORAL | 3 refills | Status: AC

## 2024-07-26 ENCOUNTER — Ambulatory Visit (INDEPENDENT_AMBULATORY_CARE_PROVIDER_SITE_OTHER): Admitting: Internal Medicine

## 2024-07-26 ENCOUNTER — Encounter: Payer: Self-pay | Admitting: Internal Medicine

## 2024-07-26 ENCOUNTER — Encounter: Payer: Self-pay | Admitting: Cardiology

## 2024-07-26 VITALS — BP 140/86 | HR 58 | Temp 97.8°F | Ht 72.0 in | Wt 195.2 lb

## 2024-07-26 DIAGNOSIS — I2510 Atherosclerotic heart disease of native coronary artery without angina pectoris: Secondary | ICD-10-CM | POA: Diagnosis not present

## 2024-07-26 DIAGNOSIS — I82509 Chronic embolism and thrombosis of unspecified deep veins of unspecified lower extremity: Secondary | ICD-10-CM

## 2024-07-26 DIAGNOSIS — D509 Iron deficiency anemia, unspecified: Secondary | ICD-10-CM | POA: Diagnosis not present

## 2024-07-26 DIAGNOSIS — I2583 Coronary atherosclerosis due to lipid rich plaque: Secondary | ICD-10-CM | POA: Diagnosis not present

## 2024-07-26 DIAGNOSIS — M810 Age-related osteoporosis without current pathological fracture: Secondary | ICD-10-CM

## 2024-07-26 DIAGNOSIS — F419 Anxiety disorder, unspecified: Secondary | ICD-10-CM | POA: Diagnosis not present

## 2024-07-26 DIAGNOSIS — K2270 Barrett's esophagus without dysplasia: Secondary | ICD-10-CM | POA: Diagnosis not present

## 2024-07-26 DIAGNOSIS — M545 Low back pain, unspecified: Secondary | ICD-10-CM

## 2024-07-26 DIAGNOSIS — G8929 Other chronic pain: Secondary | ICD-10-CM | POA: Diagnosis not present

## 2024-07-26 NOTE — Progress Notes (Signed)
 Subjective:  Patient ID: Roger Kent, male    DOB: August 26, 1946  Age: 78 y.o. MRN: [id number]  CC: Follow-up (Patient has a few things to discuss. )   HPI Roger Kent presents for h/o DVT, valvular heart disease, anxiety disorder. Bo just had a TEE  Outpatient Medications Prior to Visit  Medication Sig Dispense Refill   apixaban  (ELIQUIS ) 5 MG TABS tablet Take 1 tablet (5 mg total) by mouth 2 (two) times daily. 180 tablet 3   Apoaequorin (PREVAGEN PO) Take 1 capsule by mouth daily. Pro     Bromfenac Sodium 0.07 % SOLN Place 1 drop into both eyes at bedtime.     busPIRone  (BUSPAR ) 7.5 MG tablet TAKE ONE TABLET BY MOUTH TWICE DAILY 60 tablet 5   cetirizine (ZYRTEC) 10 MG tablet Take 10 mg by mouth daily.     Coenzyme Q10 (CO Q 10 PO) Take 300 mg by mouth daily.     COLLAGEN PO Take 1 capsule by mouth daily. collagen hydrolyzed     Dextromethorphan-guaiFENesin (MUCINEX DM MAXIMUM STRENGTH) 60-1200 MG TB12 Take 1 tablet by mouth daily.     esomeprazole  (NEXIUM ) 20 MG capsule TAKE 1 CAPSULE DAILY 30    MINUTES TO AN HOUR BEFORE AMEAL 90 capsule 3   Glucosamine HCl (GLUCOSAMINE PO) Take 3,000 mg by mouth daily. MSM 1500 mg  1500 mg     MAGNESIUM  GLUCONATE PO Take 120 mg by mouth daily.     melatonin 3 MG TABS tablet Take 3 mg by mouth at bedtime as needed.     methocarbamol  (ROBAXIN ) 500 MG tablet Take 1-2 tablets (500-1,000 mg total) by mouth every 6 (six) hours as needed for muscle spasms. 60 tablet 0   olmesartan  (BENICAR ) 40 MG tablet Take 1 tablet (40 mg total) by mouth daily. 90 tablet 3   OVER THE COUNTER MEDICATION Take 1 Package by mouth daily. Relief factor  4 tablet each package     OVER THE COUNTER MEDICATION Take 1 tablet by mouth daily. Nitricoxide Niacin     OVER THE COUNTER MEDICATION Take 6 tablets by mouth daily. Super fruit 3 each Super veggie 3 each     OVER THE COUNTER MEDICATION Take 1 Scoop by mouth daily. Orgain organic protein powder Adds/  vital protein collagen peptides 2-4 table spoons     OVER THE COUNTER MEDICATION Take 2 tablets by mouth daily. Super beets chews     OVER THE COUNTER MEDICATION Place 1 application  into both nostrils daily. navage nasal care     Probiotic Product (PROBIOTIC PO) Take 1 capsule by mouth daily. 60 billion     REPATHA  SURECLICK 140 MG/ML SOAJ INJECT (140MG ) INTO THE SKIN ONCE EVERY 14 DAYS 6 mL 0   simethicone (MYLICON) 125 MG chewable tablet Chew 125 mg by mouth every 6 (six) hours as needed for flatulence.     Sodium Hyaluronate, oral, (HYALURONIC ACID PO) Take 2,000 mg by mouth daily. 1000 mg each (Patient not taking: Reported on 07/26/2024)     Facility-Administered Medications Prior to Visit  Medication Dose Route Frequency Provider Last Rate Last Admin   denosumab  (PROLIA ) injection 60 mg  60 mg Subcutaneous Once Matthews, Stephanie L, FNP        ROS: Review of Systems  Constitutional:  Negative for appetite change, fatigue and unexpected weight change.  HENT:  Negative for congestion, nosebleeds, sneezing, sore throat and trouble swallowing.   Eyes:  Negative for itching and  visual disturbance.  Respiratory:  Negative for cough.   Cardiovascular:  Negative for chest pain, palpitations and leg swelling.  Gastrointestinal:  Negative for abdominal distention, blood in stool, diarrhea and nausea.  Genitourinary:  Negative for frequency and hematuria.  Musculoskeletal:  Positive for arthralgias, back pain and gait problem. Negative for joint swelling and neck pain.  Skin:  Negative for rash.  Neurological:  Negative for dizziness, tremors, speech difficulty and weakness.  Psychiatric/Behavioral:  Negative for agitation, dysphoric mood, sleep disturbance and suicidal ideas. The patient is not nervous/anxious.     Objective:  BP (!) 140/86   Pulse (!) 58   Temp 97.8 F (36.6 C) (Oral)   Ht 6' (1.829 m)   Wt 195 lb 3.2 oz (88.5 kg)   SpO2 90%   BMI 26.47 kg/m   BP Readings from  Last 3 Encounters:  07/26/24 (!) 140/86  06/25/24 110/76  05/25/24 120/73    Wt Readings from Last 3 Encounters:  07/26/24 195 lb 3.2 oz (88.5 kg)  06/25/24 194 lb (88 kg)  06/23/24 197 lb 6.4 oz (89.5 kg)    Physical Exam Constitutional:      General: He is not in acute distress.    Appearance: He is well-developed.     Comments: NAD  Eyes:     Conjunctiva/sclera: Conjunctivae normal.     Pupils: Pupils are equal, round, and reactive to light.  Neck:     Thyroid : No thyromegaly.     Vascular: No JVD.  Cardiovascular:     Rate and Rhythm: Normal rate and regular rhythm.     Heart sounds: Normal heart sounds. No murmur heard.    No friction rub. No gallop.  Pulmonary:     Effort: Pulmonary effort is normal. No respiratory distress.     Breath sounds: Normal breath sounds. No wheezing or rales.  Chest:     Chest wall: No tenderness.  Abdominal:     General: Bowel sounds are normal. There is no distension.     Palpations: Abdomen is soft. There is no mass.     Tenderness: There is no abdominal tenderness. There is no guarding or rebound.  Musculoskeletal:        General: Tenderness present. Normal range of motion.     Cervical back: Normal range of motion.     Right lower leg: No edema.     Left lower leg: No edema.  Lymphadenopathy:     Cervical: No cervical adenopathy.  Skin:    General: Skin is warm and dry.     Findings: No rash.  Neurological:     Mental Status: He is alert and oriented to person, place, and time.     Cranial Nerves: No cranial nerve deficit.     Motor: No abnormal muscle tone.     Coordination: Coordination normal.     Gait: Gait normal.     Deep Tendon Reflexes: Reflexes are normal and symmetric.  Psychiatric:        Behavior: Behavior normal.        Thought Content: Thought content normal.        Judgment: Judgment normal.   Left knee with pain on range of motion Compression sleeves on both calves  Lab Results  Component Value Date    WBC 8.8 06/23/2024   HGB 14.8 06/23/2024   HCT 46.4 06/23/2024   PLT 236 06/23/2024   GLUCOSE 71 06/23/2024   CHOL 123 05/05/2024   TRIG 64 05/05/2024  HDL 43 05/05/2024   LDLCALC 66 05/05/2024   ALT 16 04/30/2024   AST 18 04/30/2024   NA 136 06/23/2024   K 4.6 06/23/2024   CL 98 06/23/2024   CREATININE 1.06 06/23/2024   BUN 19 06/23/2024   CO2 22 06/23/2024   TSH 2.72 05/01/2023   PSA 20.78 (H) 05/01/2023   INR 2.4 RATIO (H) 09/17/2007   HGBA1C 5.8 12/29/2018    ECHO TEE Result Date: 06/25/2024    TRANSESOPHOGEAL ECHO REPORT   Patient Name:   KHADEEM ROCKETT Date of Exam: 06/25/2024 Medical Rec #:  [id number]              Height:       72.0 in Accession #:    [id number]             Weight:       194.0 lb Date of Birth:  1946/06/16              BSA:          2.103 m Patient Age:    78 years               BP:           140/92 mmHg Patient Gender: M                      HR:           67 bpm. Exam Location:  Inpatient Procedure: Transesophageal Echo, 3D Echo, Color Doppler and Cardiac Doppler            (Both Spectral and Color Flow Doppler were utilized during            procedure). Indications:    I34.0 Nonrheumatic mitral (valve) insufficiency  History:        Patient has prior history of Echocardiogram examinations, most                 recent 06/14/2024. VSD, CAD; Risk Factors:Hypertension and                 Dyslipidemia.  Sonographer:    Damien Senior RDCS Referring Phys: [id number] MAUDE JAYSON EMMER PROCEDURE: After discussion of the risks and benefits of a TEE, an informed consent was obtained from the patient. The transesophogeal probe was passed without difficulty through the esophogus of the patient. Sedation performed by different physician. The patient was monitored while under deep sedation. Anesthestetic sedation was provided intravenously by Anesthesiology: 578mg  of Propofol , 80mg  of Lidocaine . The patient developed no complications during the procedure.  IMPRESSIONS  1. Restrictive  VSD peri- mebranous with only left to right shunting Diameter 0.7 mm.  2. Left ventricular ejection fraction, by estimation, is 55 to 60%. The left ventricle has normal function.  3. Right ventricular systolic function is mildly reduced. The right ventricular size is mildly enlarged.  4. Left atrial size was moderately dilated. No left atrial/left atrial appendage thrombus was detected.  5. Right atrial size was mildly dilated.  6. Eccentric anteriorly directed MR. 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 The mitral valve is abnormal. Moderate mitral valve regurgitation.  7. The aortic valve is tricuspid. There is mild calcification of the aortic valve. There is mild thickening of the aortic valve. Aortic valve regurgitation is mild. Aortic valve sclerosis/calcification is present, without any evidence of aortic stenosis.  8. Aortic dilatation noted.  9. 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 FINDINGS  Left Ventricle: Left ventricular ejection fraction, by estimation, is 55 to 60%. The left ventricle has normal function. The left ventricular internal cavity size was normal in size. Right Ventricle: The right ventricular size is mildly enlarged. Right vetricular wall thickness was not assessed. Right ventricular systolic function is mildly reduced. Left Atrium: Left atrial size was moderately dilated. No left atrial/left atrial appendage thrombus was detected. Right Atrium: Right atrial size was mildly dilated. Pericardium: There is no evidence of pericardial effusion. Mitral Valve: Eccentric anteriorly directed MR. 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 The mitral valve is abnormal. Moderate mitral valve regurgitation. Tricuspid Valve: The tricuspid valve is normal in structure. Tricuspid valve regurgitation is mild. Aortic Valve: The aortic  valve is tricuspid. There is mild calcification of the aortic valve. There is mild thickening of the aortic valve. Aortic valve regurgitation is mild. Aortic valve sclerosis/calcification is present, without any evidence of aortic stenosis. Pulmonic Valve: The pulmonic valve was normal in structure. Pulmonic valve regurgitation is mild. Aorta: Aortic dilatation noted. IAS/Shunts: No atrial level shunt detected by color flow Doppler. Additional Comments: Restrictive VSD peri- mebranous with only left to right shunting Diameter 0.7 mm. 3D was performed not requiring image post processing on an independent workstation and was abnormal. Spectral Doppler performed. Maude Emmer MD Electronically signed by Maude Emmer MD Signature Date/Time: 06/25/2024/12:38:39 PM    Final    EP STUDY Result Date: 06/25/2024 See surgical note for result.   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       Coronary artery disease   F/u w/Dr Emmer On Pravastatin , Xarelto  S/p TEE      Iron deficiency anemia (Chronic)   F/u w/Dr Timmy armin CANDIE Franchot, NP      Leg DVT (deep venous thromboembolism), chronic, unspecified laterality (HCC) - Primary (Chronic)   Xarelto       Low back pain   In PT, massage  Blue-Emu cream was recommended to use 2-3 times a day       Osteoporosis   Strength training Vit D3 and Vit K2+MK7 Prolia  q 6 mo         No orders of the defined types were placed in this encounter.     Follow-up: Return in about 6 months (around 01/23/2025) for Wellness Exam.  Marolyn Noel, MD

## 2024-07-26 NOTE — Assessment & Plan Note (Signed)
EGD/colon pending - Dr Lorenso Courier On Nexium  Potential benefits of a long term PPI use as well as potential risks  and complications were explained to the patient and were aknowledged.

## 2024-07-26 NOTE — Assessment & Plan Note (Signed)
 Xarelto

## 2024-07-26 NOTE — Assessment & Plan Note (Signed)
Strength training Vit D3 and Vit K2+MK7 Prolia q 6 mo

## 2024-07-26 NOTE — Assessment & Plan Note (Signed)
 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

## 2024-07-26 NOTE — Assessment & Plan Note (Signed)
 F/u w/Dr Nishan On Pravastatin , Xarelto  S/p TEE

## 2024-07-26 NOTE — Assessment & Plan Note (Signed)
In PT, massage  ?Blue-Emu cream was recommended to use 2-3 times a day ? ?

## 2024-07-26 NOTE — Assessment & Plan Note (Signed)
 F/u w/Dr Timmy armin CANDIE Franchot, NP

## 2024-07-27 NOTE — Telephone Encounter (Signed)
 Repeat message. See other MyChart.

## 2024-07-28 ENCOUNTER — Telehealth: Payer: Self-pay | Admitting: Cardiology

## 2024-07-28 NOTE — Telephone Encounter (Signed)
 Patient sent this message via Pt. Schedule:  Got results would care for what's next, test? Again 6 months? Fill me in on your [employer] my schedule is open and would like your in out.respectfully Rogoff

## 2024-07-28 NOTE — Telephone Encounter (Signed)
 My chart message sent to patient with dr vertie recommendations and to schedule follow up appointment.

## 2024-08-02 ENCOUNTER — Inpatient Hospital Stay: Attending: Hematology & Oncology

## 2024-08-02 ENCOUNTER — Inpatient Hospital Stay (HOSPITAL_BASED_OUTPATIENT_CLINIC_OR_DEPARTMENT_OTHER): Admitting: Family

## 2024-08-02 VITALS — BP 134/76 | HR 65 | Temp 98.3°F | Resp 17 | Ht 72.0 in | Wt 192.8 lb

## 2024-08-02 DIAGNOSIS — I82532 Chronic embolism and thrombosis of left popliteal vein: Secondary | ICD-10-CM | POA: Diagnosis not present

## 2024-08-02 DIAGNOSIS — D508 Other iron deficiency anemias: Secondary | ICD-10-CM

## 2024-08-02 DIAGNOSIS — Z7901 Long term (current) use of anticoagulants: Secondary | ICD-10-CM | POA: Insufficient documentation

## 2024-08-02 DIAGNOSIS — I2699 Other pulmonary embolism without acute cor pulmonale: Secondary | ICD-10-CM

## 2024-08-02 DIAGNOSIS — I82451 Acute embolism and thrombosis of right peroneal vein: Secondary | ICD-10-CM | POA: Insufficient documentation

## 2024-08-02 DIAGNOSIS — D6862 Lupus anticoagulant syndrome: Secondary | ICD-10-CM | POA: Diagnosis not present

## 2024-08-02 DIAGNOSIS — I82509 Chronic embolism and thrombosis of unspecified deep veins of unspecified lower extremity: Secondary | ICD-10-CM

## 2024-08-02 DIAGNOSIS — I82512 Chronic embolism and thrombosis of left femoral vein: Secondary | ICD-10-CM | POA: Insufficient documentation

## 2024-08-02 DIAGNOSIS — I82811 Embolism and thrombosis of superficial veins of right lower extremities: Secondary | ICD-10-CM | POA: Diagnosis not present

## 2024-08-02 DIAGNOSIS — D509 Iron deficiency anemia, unspecified: Secondary | ICD-10-CM | POA: Diagnosis not present

## 2024-08-02 LAB — CMP (CANCER CENTER ONLY)
ALT: 16 U/L (ref 0–44)
AST: 21 U/L (ref 15–41)
Albumin: 4.6 g/dL (ref 3.5–5.0)
Alkaline Phosphatase: 41 U/L (ref 38–126)
Anion gap: 10 (ref 5–15)
BUN: 20 mg/dL (ref 8–23)
CO2: 25 mmol/L (ref 22–32)
Calcium: 9.5 mg/dL (ref 8.9–10.3)
Chloride: 106 mmol/L (ref 98–111)
Creatinine: 1.15 mg/dL (ref 0.61–1.24)
GFR, Estimated: >60 mL/min (ref >60)
Glucose, Bld: 104 mg/dL — ABNORMAL HIGH (ref 70–99)
Potassium: 4.7 mmol/L (ref 3.5–5.1)
Sodium: 141 mmol/L (ref 135–145)
Total Bilirubin: 0.6 mg/dL (ref 0.0–1.2)
Total Protein: 7.7 g/dL (ref 6.5–8.1)

## 2024-08-02 LAB — IRON AND IRON BINDING CAPACITY (CC-WL,HP ONLY)
Iron: 101 ug/dL (ref 45–182)
Saturation Ratios: 29 % (ref 17.9–39.5)
TIBC: 343 ug/dL (ref 250–450)
UIBC: 242 ug/dL

## 2024-08-02 LAB — CBC WITH DIFFERENTIAL (CANCER CENTER ONLY)
Abs Immature Granulocytes: 0.01 K/uL (ref 0.00–0.07)
Basophils Absolute: 0 K/uL (ref 0.0–0.1)
Basophils Relative: 1 %
Eosinophils Absolute: 0.3 K/uL (ref 0.0–0.5)
Eosinophils Relative: 3 %
HCT: 44.6 % (ref 39.0–52.0)
Hemoglobin: 14.8 g/dL (ref 13.0–17.0)
Immature Granulocytes: 0 %
Lymphocytes Relative: 34 %
Lymphs Abs: 2.8 K/uL (ref 0.7–4.0)
MCH: 31.4 pg (ref 26.0–34.0)
MCHC: 33.2 g/dL (ref 30.0–36.0)
MCV: 94.5 fL (ref 80.0–100.0)
Monocytes Absolute: 0.9 K/uL (ref 0.1–1.0)
Monocytes Relative: 10 %
Neutro Abs: 4.5 K/uL (ref 1.7–7.7)
Neutrophils Relative %: 52 %
Platelet Count: 208 K/uL (ref 150–400)
RBC: 4.72 MIL/uL (ref 4.22–5.81)
RDW: 14.4 % (ref 11.5–15.5)
WBC Count: 8.5 K/uL (ref 4.0–10.5)
nRBC: 0 % (ref 0.0–0.2)

## 2024-08-02 LAB — FERRITIN: Ferritin: 81 ng/mL (ref 24–336)

## 2024-08-02 NOTE — Progress Notes (Signed)
 Hematology and Oncology Follow Up Visit  Roger Kent [id number] 09-Aug-1946 78 y.o. 08/02/2024   Principle Diagnosis:  New right upper and lower PE - dx 04/26/2024 Chronic DVT left femoral and popliteal veins DVT of the right peroneal vein SVT of the right saphenous vein (+) lupus anti-coagulant Iron deficiency anemia   Past Therapy: Xarelto  20 mg po q day -- 1 yr of therapeutic to complete in 05/2019 Xarelto  10 mg po q day -- maintenance x 30yr -- start 05/2019 -- completed on 06/2020 EC ASA 81 mg po q day -- started on 01/11/2019 -- changed to 325 mg po q day on 06/05/2020   Current Therapy:        Eliquis  5 mg PO BID - lifelong IV iron as indicated    Interim History:  Roger Kent is here today for follow-up. He is doing well and has no complaints at this time.  He had recent TEE with cardiology and is awaiting the results.  He is exercising at the gym regularly and denies fatigue.  No issue with blood loss on Eliquis . No abnormal bruising, no petechiae.  No fever, chill, n/v, cough, rash, dizziness, SOB, chest pain, palpitations, abdominal pain or changes in bowel or bladder habits.  Chronic swelling in leg leg is improved. No pitting edema. Patient wears compression stockings during the day for added support.  No falls or syncope reported.  Appetite and hydration are good. Weight is stable at 192 lbs.    ECOG Performance Status: 1 - Symptomatic but completely ambulatory  Medications:  Allergies as of 08/02/2024       Reactions   Oxycodone -acetaminophen  Other (See Comments)   Other Reaction: agitation   Zetia  [ezetimibe ] Other (See Comments)   Brain fog, felt sluggish   Crestor  [rosuvastatin ] Other (See Comments)   Muscle pain   Lamisil  [terbinafine ] Rash        Medication List        Accurate as of August 02, 2024  9:46 AM. If you have any questions, ask your nurse or doctor.          apixaban  5 MG Tabs tablet Commonly known as:  Eliquis  Take 1 tablet (5 mg total) by mouth 2 (two) times daily.   Bromfenac Sodium 0.07 % Soln Place 1 drop into both eyes at bedtime.   busPIRone  7.5 MG tablet Commonly known as: BUSPAR  TAKE ONE TABLET BY MOUTH TWICE DAILY   cetirizine 10 MG tablet Commonly known as: ZYRTEC Take 10 mg by mouth daily.   CO Q 10 PO Take 300 mg by mouth daily.   COLLAGEN PO Take 1 capsule by mouth daily. collagen hydrolyzed   esomeprazole  20 MG capsule Commonly known as: NEXIUM  TAKE 1 CAPSULE DAILY 30    MINUTES TO AN HOUR BEFORE AMEAL   GLUCOSAMINE PO Take 3,000 mg by mouth daily. MSM 1500 mg  1500 mg   HYALURONIC ACID PO Take 2,000 mg by mouth daily. 1000 mg each   MAGNESIUM  GLUCONATE PO Take 120 mg by mouth daily.   melatonin 3 MG Tabs tablet Take 3 mg by mouth at bedtime as needed.   methocarbamol  500 MG tablet Commonly known as: ROBAXIN  Take 1-2 tablets (500-1,000 mg total) by mouth every 6 (six) hours as needed for muscle spasms.   Mucinex DM Maximum Strength 60-1200 MG Tb12 Take 1 tablet by mouth daily.   olmesartan  40 MG tablet Commonly known as: BENICAR  Take 1 tablet (40 mg total) by mouth daily.  OVER THE COUNTER MEDICATION Take 1 Package by mouth daily. Relief factor  4 tablet each package   OVER THE COUNTER MEDICATION Take 1 tablet by mouth daily. Nitricoxide Niacin   OVER THE COUNTER MEDICATION Take 6 tablets by mouth daily. Super fruit 3 each Super veggie 3 each   OVER THE COUNTER MEDICATION Take 1 Scoop by mouth daily. Orgain organic protein powder Adds/ vital protein collagen peptides 2-4 table spoons   OVER THE COUNTER MEDICATION Take 2 tablets by mouth daily. Super beets chews   OVER THE COUNTER MEDICATION Place 1 application  into both nostrils daily. navage nasal care   PREVAGEN PO Take 1 capsule by mouth daily. Pro   PROBIOTIC PO Take 1 capsule by mouth daily. 60 billion   Repatha  SureClick 140 MG/ML Soaj Generic drug:  Evolocumab  INJECT (140MG ) INTO THE SKIN ONCE EVERY 14 DAYS   simethicone 125 MG chewable tablet Commonly known as: MYLICON Chew 125 mg by mouth every 6 (six) hours as needed for flatulence.        Allergies:  Allergies  Allergen Reactions   Oxycodone -Acetaminophen  Other (See Comments)    Other Reaction: agitation   Zetia  [Ezetimibe ] Other (See Comments)    Brain fog, felt sluggish   Crestor  [Rosuvastatin ] Other (See Comments)    Muscle pain   Lamisil  [Terbinafine ] Rash    Past Medical History, Surgical history, Social history, and Family History were reviewed and updated.  Review of Systems: All other 10 point review of systems is negative.   Physical Exam:  vitals were not taken for this visit.   Wt Readings from Last 3 Encounters:  07/26/24 195 lb 3.2 oz (88.5 kg)  06/25/24 194 lb (88 kg)  06/23/24 197 lb 6.4 oz (89.5 kg)    Ocular: Sclerae unicteric, pupils equal, round and reactive to light Ear-nose-throat: Oropharynx clear, dentition fair Lymphatic: No cervical or supraclavicular adenopathy Lungs no rales or rhonchi, good excursion bilaterally Heart regular rate and rhythm, no murmur appreciated Abd soft, nontender, positive bowel sounds MSK no focal spinal tenderness, no joint edema Neuro: non-focal, well-oriented, appropriate affect Breasts: Deferred   Lab Results  Component Value Date   WBC 8.8 06/23/2024   HGB 14.8 06/23/2024   HCT 46.4 06/23/2024   MCV 98 (H) 06/23/2024   PLT 236 06/23/2024   Lab Results  Component Value Date   FERRITIN 119 04/30/2024   IRON 146 04/30/2024   TIBC 350 04/30/2024   UIBC 204 04/30/2024   IRONPCTSAT 42 (H) 04/30/2024   Lab Results  Component Value Date   RETICCTPCT 1.4 08/12/2023   RBC 4.74 06/23/2024   No results found for: KPAFRELGTCHN, LAMBDASER, KAPLAMBRATIO No results found for: KIMBERLY LE, IGMSERUM Lab Results  Component Value Date   ALBUMINELP 4.1 04/16/2022   A1GS 0.3 04/16/2022    A2GS 0.6 04/16/2022   BETS 0.5 04/16/2022   BETA2SER 0.4 04/16/2022   GAMS 1.3 04/16/2022   SPEI  04/16/2022     Comment:     Normal Serum Protein Electrophoresis Pattern. No abnormal protein bands (M-protein) detected.      Chemistry      Component Value Date/Time   NA 136 06/23/2024 1419   K 4.6 06/23/2024 1419   CL 98 06/23/2024 1419   CO2 22 06/23/2024 1419   BUN 19 06/23/2024 1419   CREATININE 1.06 06/23/2024 1419   CREATININE 1.18 04/30/2024 1243   CREATININE 0.98 08/01/2020 0924      Component Value Date/Time  CALCIUM  9.3 06/23/2024 1419   ALKPHOS 32 (L) 04/30/2024 1243   AST 18 04/30/2024 1243   ALT 16 04/30/2024 1243   BILITOT 0.9 04/30/2024 1243       Impression and Plan: Roger Kent is a 78 yo caucasian gentleman with history significant for DVT as well as superficial thrombosis of the right and left lower extremities. Left chronic DVT stable.  He had a new PE and occlusive DVT that occurred while on full dose aspirin and was changed to Eliquis .  He is tolerating Eliquis  nicely and will continue the 5 mg PO BID. We will look at reducing him to maintenance dosing after 1 year.  We will repeat CTA and US  of left leg this week to assess his response.  Iron studies are pending. We will replace if needed.  Follow-up in 6 months.   Lauraine Pepper, NP 9/15/20259:46 AM

## 2024-08-04 ENCOUNTER — Telehealth (HOSPITAL_BASED_OUTPATIENT_CLINIC_OR_DEPARTMENT_OTHER): Payer: Self-pay

## 2024-08-04 ENCOUNTER — Encounter: Payer: Self-pay | Admitting: Family

## 2024-08-12 ENCOUNTER — Encounter (HOSPITAL_BASED_OUTPATIENT_CLINIC_OR_DEPARTMENT_OTHER): Payer: Self-pay

## 2024-08-13 ENCOUNTER — Ambulatory Visit (HOSPITAL_BASED_OUTPATIENT_CLINIC_OR_DEPARTMENT_OTHER)
Admission: RE | Admit: 2024-08-13 | Discharge: 2024-08-13 | Disposition: A | Discharge status: Home / Self Care | Source: Ambulatory Visit | Attending: Family | Admitting: Family

## 2024-08-13 DIAGNOSIS — Q210 Ventricular septal defect: Secondary | ICD-10-CM | POA: Diagnosis not present

## 2024-08-13 DIAGNOSIS — I82509 Chronic embolism and thrombosis of unspecified deep veins of unspecified lower extremity: Secondary | ICD-10-CM | POA: Insufficient documentation

## 2024-08-13 DIAGNOSIS — I2699 Other pulmonary embolism without acute cor pulmonale: Secondary | ICD-10-CM | POA: Insufficient documentation

## 2024-08-13 DIAGNOSIS — I2510 Atherosclerotic heart disease of native coronary artery without angina pectoris: Secondary | ICD-10-CM | POA: Diagnosis not present

## 2024-08-13 DIAGNOSIS — Z86718 Personal history of other venous thrombosis and embolism: Secondary | ICD-10-CM | POA: Diagnosis not present

## 2024-08-13 DIAGNOSIS — R918 Other nonspecific abnormal finding of lung field: Secondary | ICD-10-CM | POA: Diagnosis not present

## 2024-08-13 DIAGNOSIS — I7121 Aneurysm of the ascending aorta, without rupture: Secondary | ICD-10-CM | POA: Diagnosis not present

## 2024-08-13 MED ORDER — IOHEXOL 350 MG/ML SOLN
75.0000 mL | Freq: Once | INTRAVENOUS | Status: AC | PRN
  Administered 2024-08-13: 75 mL via INTRAVENOUS

## 2024-08-16 NOTE — Progress Notes (Unsigned)
 HPI: FU MR, congenital VSD, and prior DVT. Calcium  score June 2020-277 and mildly dilated ascending aorta at 4.3 cm. Nuclear study June 2020 showed ejection fraction 51%, small fixed apical defect felt to be artifact and no ischemia. CTA June 2023 showed 4.1 cm ascending thoracic aortic aneurysm.  Lower extremity venous Dopplers June 2025 showed occlusive DVT in the left popliteal vein.  Abdominal ultrasound July 2025 showed 3.4 cm abdominal aortic aneurysm.  Echocardiogram July 2025 showed normal LV function, mild left ventricular hypertrophy, mild left atrial enlargement, moderate to severe mitral regurgitation, functionally bicuspid aortic valve with trace aortic insufficiency and small restrictive perimembranous VSD.  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  CTA September 2025 showed no pulmonary embolus ascending thoracic aortic aneurysm at 4.4 x 4.3 cm, coronary calcification.  Since I last saw him patient denies dyspnea, chest pain, palpitations or syncope.  Current Outpatient Medications  Medication Sig Dispense Refill   apixaban  (ELIQUIS ) 5 MG TABS tablet Take 1 tablet (5 mg total) by mouth 2 (two) times daily. 180 tablet 3   Apoaequorin (PREVAGEN PO) Take 1 capsule by mouth daily. Pro     Bromfenac Sodium 0.07 % SOLN Place 1 drop into both eyes at bedtime.     busPIRone  (BUSPAR ) 7.5 MG tablet TAKE ONE TABLET BY MOUTH TWICE DAILY 60 tablet 5   cetirizine (ZYRTEC) 10 MG tablet Take 10 mg by mouth daily.     Coenzyme Q10 (CO Q 10 PO) Take 300 mg by mouth daily.     COLLAGEN PO Take 1 capsule by mouth daily. collagen hydrolyzed     Dextromethorphan-guaiFENesin (MUCINEX DM MAXIMUM STRENGTH) 60-1200 MG TB12 Take 1 tablet by mouth daily.     esomeprazole  (NEXIUM ) 20 MG capsule TAKE 1 CAPSULE DAILY 30    MINUTES TO  AN HOUR BEFORE AMEAL 90 capsule 3   Glucosamine HCl (GLUCOSAMINE PO) Take 3,000 mg by mouth daily. MSM 1500 mg  1500 mg     MAGNESIUM  GLUCONATE PO Take 120 mg by mouth daily.     melatonin 3 MG TABS tablet Take 3 mg by mouth at bedtime as needed.     methocarbamol  (ROBAXIN ) 500 MG tablet Take 1-2 tablets (500-1,000 mg total) by mouth every 6 (six) hours as needed for muscle spasms. (Patient not taking: Reported on 08/02/2024) 60 tablet 0   olmesartan  (BENICAR ) 40 MG tablet Take 1 tablet (40 mg total) by mouth daily. 90 tablet 3   OVER THE COUNTER MEDICATION Take 1 Package by mouth daily. Relief factor  4 tablet each package     OVER THE COUNTER MEDICATION Take 1 tablet by mouth daily. Nitricoxide Niacin     OVER THE COUNTER MEDICATION Take 6 tablets by mouth daily. Super fruit 3 each Super veggie 3 each     OVER THE COUNTER MEDICATION Take 1 Scoop by mouth daily. Orgain organic protein powder Adds/ vital protein collagen peptides 2-4 table spoons     OVER THE COUNTER MEDICATION Take 2 tablets by mouth daily. Super beets chews     OVER THE COUNTER MEDICATION Place 1 application  into both nostrils daily. navage nasal care     Probiotic Product (PROBIOTIC PO) Take 1 capsule by mouth daily. 60 billion     REPATHA  SURECLICK 140 MG/ML SOAJ INJECT (140MG ) INTO THE SKIN  ONCE EVERY 14 DAYS 6 mL 0   simethicone (MYLICON) 125 MG chewable tablet Chew 125 mg by mouth every 6 (six) hours as needed for flatulence. (Patient not taking: Reported on 08/02/2024)     Sodium Hyaluronate, oral, (HYALURONIC ACID PO) Take 2,000 mg by mouth daily. 1000 mg each     Current Facility-Administered Medications  Medication Dose Route Frequency Provider Last Rate Last Admin   denosumab  (PROLIA ) injection 60 mg  60 mg Subcutaneous Once Matthews, Stephanie L, FNP         Past Medical History:  Diagnosis Date   Acute hepatitis C without mention of hepatic coma(070.51)    Allergic rhinitis, cause unspecified     Anxiety state, unspecified    Arthritis    Barrett's esophagus    BPH (benign prostatic hyperplasia)    Cancer (HCC)    skin: face,scalp   DVT (deep venous thrombosis) (HCC)    Elevated PSA    Dr Ottelin   Esophageal reflux    Heart murmur    Hepatitis C    Hiatal hernia    Hyperlipidemia    Hypertension    Hypertonicity of bladder    Insomnia    Iron deficiency anemia 04/30/2022   Iron malabsorption 04/30/2022   Leg DVT (deep venous thromboembolism), chronic, unspecified laterality (HCC) 09/21/2018   Osteoporosis, unspecified    Peripheral vascular disease    Personal history of urinary calculi    Recent retinal detachment, partial, with giant tear    Unspecified tinnitus    Ventricular septal defect     Past Surgical History:  Procedure Laterality Date   COLONOSCOPY     KNEE SURGERY Bilateral    RETINAL DETACHMENT SURGERY     TOE SURGERY Left    great toe   TOTAL KNEE ARTHROPLASTY Left 08/25/2023   Procedure: TOTAL KNEE ARTHROPLASTY;  Surgeon: Rubie Kemps, MD;  Location: [facility];  Service: Orthopedics;  Laterality: Left;   TRANSESOPHAGEAL ECHOCARDIOGRAM (CATH LAB) N/A 06/25/2024   Procedure: TRANSESOPHAGEAL ECHOCARDIOGRAM;  Surgeon: Delford Maude BROCKS, MD;  Location: [facility] INVASIVE CV LAB;  Service: Cardiovascular;  Laterality: N/A;   UPPER GI ENDOSCOPY      Social History   Socioeconomic History   Marital status: Married    Spouse name: Hendricks Barretta   Number of children: 1   Years of education: Not on file   Highest education level: Some college, no degree  Occupational History   Occupation: retired    Associate Professor: [employer]    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   Occupation: bee keepin  Tobacco Use   Smoking status: Former    Current packs/day: 0.00    Types: Cigarettes    Quit date: 1976    Years since quitting: 49.7    Passive exposure: Past   Smokeless tobacco: Never  Vaping Use   Vaping status: Never Used  Substance and Sexual Activity   Alcohol use: Not Currently    Drug use: No   Sexual activity: Yes  Other Topics Concern   Not on file  Social History Narrative               Social Drivers of Health   Financial Resource Strain: Low Risk  (07/22/2024)   Overall Financial Resource Strain (CARDIA)    Difficulty of Paying Living Expenses: Not hard at all  Food Insecurity: No Food Insecurity (07/22/2024)   Hunger Vital Sign    Worried About Running Out of Food in the Last Year: Never true  Ran Out of Food in the Last Year: Never true  Transportation Needs: No Transportation Needs (07/22/2024)   PRAPARE - Administrator, Civil Service (Medical): No    Lack of Transportation (Non-Medical): No  Physical Activity: Sufficiently Active (07/22/2024)   Exercise Vital Sign    Days of Exercise per Week: 7 days    Minutes of Exercise per Session: 30 min  Stress: No Stress Concern Present (07/22/2024)   [employer] of Occupational Health - Occupational Stress Questionnaire    Feeling of Stress: Only a little  Social Connections: Moderately Isolated (07/22/2024)   Social Connection and Isolation Panel    Frequency of Communication with Friends and Family: Once a week    Frequency of Social Gatherings with Friends and Family: Once a week    Attends Religious Services: Patient declined    Database administrator or Organizations: Yes    Attends Banker Meetings: Never    Marital Status: Married  Catering manager Violence: Not At Risk (01/12/2024)   Humiliation, Afraid, Rape, and Kick questionnaire    Fear of Current or Ex-Partner: No    Emotionally Abused: No    Physically Abused: No    Sexually Abused: No    Family History  Problem Relation Age of Onset   Ovarian cancer Mother    Deep vein thrombosis Mother    Hypertension Other    Alcohol abuse Neg Hx    Colon cancer Neg Hx    Stomach cancer Neg Hx    Esophageal cancer Neg Hx    Colon polyps Neg Hx     ROS: no fevers or chills, productive cough, hemoptysis, dysphasia,  odynophagia, melena, hematochezia, dysuria, hematuria, rash, seizure activity, orthopnea, PND, pedal edema, claudication. Remaining systems are negative.  Physical Exam: Well-developed well-nourished in no acute distress.  Skin is warm and dry.  HEENT is normal.  Neck is supple.  Chest is clear to auscultation with normal expansion.  Cardiovascular exam is regular rate and rhythm.  3/6 systolic murmur left sternal border. Abdominal exam nontender or distended. No masses palpated. Extremities show no edema. neuro grossly intact   A/P  1 ventricular septal defect-small defect noted on most recent echocardiogram.  2 mitral regurgitation-moderate on transesophageal echocardiogram.  Will plan follow-up echocardiogram August 2026.  3 abdominal aortic aneurysm-plan follow-up ultrasound July 2026.  4 hypertension-patient's blood pressure is controlled today.  Continue present medical regimen.  5 coronary calcification-patient is intolerant to statins.  6 hyperlipidemia-continue Repatha .  7 thoracic aortic aneurysm-patient will need follow-up CTA September 2026.  8 history of DVT-on apixaban .  Diagnosed with pulmonary embolus June 2025.  Redell Shallow, MD

## 2024-08-17 ENCOUNTER — Ambulatory Visit: Payer: Self-pay | Admitting: *Deleted

## 2024-08-19 ENCOUNTER — Other Ambulatory Visit: Payer: Self-pay | Admitting: Cardiology

## 2024-08-19 ENCOUNTER — Encounter: Payer: Self-pay | Admitting: Cardiology

## 2024-08-19 ENCOUNTER — Ambulatory Visit: Attending: Cardiology | Admitting: Cardiology

## 2024-08-19 VITALS — BP 134/84 | HR 68 | Ht 72.0 in | Wt 192.9 lb

## 2024-08-19 DIAGNOSIS — I340 Nonrheumatic mitral (valve) insufficiency: Secondary | ICD-10-CM | POA: Insufficient documentation

## 2024-08-19 DIAGNOSIS — I2510 Atherosclerotic heart disease of native coronary artery without angina pectoris: Secondary | ICD-10-CM

## 2024-08-19 DIAGNOSIS — I712 Thoracic aortic aneurysm, without rupture, unspecified: Secondary | ICD-10-CM | POA: Diagnosis not present

## 2024-08-19 DIAGNOSIS — E785 Hyperlipidemia, unspecified: Secondary | ICD-10-CM | POA: Insufficient documentation

## 2024-08-19 DIAGNOSIS — E7800 Pure hypercholesterolemia, unspecified: Secondary | ICD-10-CM

## 2024-08-19 DIAGNOSIS — I2583 Coronary atherosclerosis due to lipid rich plaque: Secondary | ICD-10-CM | POA: Insufficient documentation

## 2024-08-19 MED ORDER — REPATHA SURECLICK 140 MG/ML ~~LOC~~ SOAJ: 140.0000 mg | SUBCUTANEOUS | 3 refills | Status: AC

## 2024-08-19 NOTE — Patient Instructions (Signed)

## 2024-08-26 ENCOUNTER — Encounter: Payer: Self-pay | Admitting: Hematology & Oncology

## 2024-08-26 ENCOUNTER — Ambulatory Visit

## 2024-08-26 DIAGNOSIS — Z23 Encounter for immunization: Secondary | ICD-10-CM | POA: Diagnosis not present

## 2024-08-26 NOTE — Progress Notes (Addendum)
 Pt was given High Dose Flu vaccine with no complications.  Medical screening examination/treatment/procedure(s) were performed by non-physician practitioner and as supervising physician I was immediately available for consultation/collaboration.  I agree with above. Karlynn Noel, MD

## 2024-08-30 ENCOUNTER — Encounter: Payer: Self-pay | Admitting: Family

## 2024-08-31 ENCOUNTER — Ambulatory Visit: Admitting: Hematology & Oncology

## 2024-08-31 ENCOUNTER — Inpatient Hospital Stay

## 2024-09-02 DIAGNOSIS — M25562 Pain in left knee: Secondary | ICD-10-CM | POA: Diagnosis not present

## 2024-09-02 DIAGNOSIS — M1711 Unilateral primary osteoarthritis, right knee: Secondary | ICD-10-CM | POA: Diagnosis not present

## 2024-09-17 ENCOUNTER — Telehealth: Payer: Self-pay | Admitting: *Deleted

## 2024-09-17 NOTE — Telephone Encounter (Signed)
   Pre-operative Risk Assessment    Patient Name: Roger Kent  DOB: 12-02-1945 MRN: [id number]   Date of last office visit: 08/19/24 Date of next office visit: 6 months   Request for Surgical Clearance    Procedure:  right total knee arthroplasty  Date of Surgery:  Clearance TBD                                Surgeon:  garnette raman md Surgeon's Group or Practice Name:  [facility] Phone number:  [phone] Fax number:  [phone]   Type of Clearance Requested:   - Pharmacy:  Hold Apixaban  (Eliquis ) they need direction   Type of Anesthesia:  Spinal   Additional requests/questions:    SignedAdrien Conquest   09/17/2024, 5:00 PM

## 2024-09-20 NOTE — Telephone Encounter (Signed)
 Dr. Pietro, patient's chart was reviewed for preoperative cardiac evaluation.  Hewas seen by you on 08/19/24 and according to protocol, we request that you comment on cardiac risk for upcoming procedure since office visit was less than 2 months ago.   Eliquis  is managed by hematology/oncology, therefore clearance to hold will need to be directed to their office.    Please route your response to p cv div preop.  Thank you, Roger [name], NP-C 09/20/2024, 11:10 AM

## 2024-09-21 DIAGNOSIS — H35372 Puckering of macula, left eye: Secondary | ICD-10-CM | POA: Diagnosis not present

## 2024-09-21 DIAGNOSIS — H04123 Dry eye syndrome of bilateral lacrimal glands: Secondary | ICD-10-CM | POA: Diagnosis not present

## 2024-09-21 DIAGNOSIS — H40013 Open angle with borderline findings, low risk, bilateral: Secondary | ICD-10-CM | POA: Diagnosis not present

## 2024-09-21 DIAGNOSIS — H35351 Cystoid macular degeneration, right eye: Secondary | ICD-10-CM | POA: Diagnosis not present

## 2024-09-21 DIAGNOSIS — T1590XA Foreign body on external eye, part unspecified, unspecified eye, initial encounter: Secondary | ICD-10-CM | POA: Diagnosis not present

## 2024-09-21 DIAGNOSIS — H35033 Hypertensive retinopathy, bilateral: Secondary | ICD-10-CM | POA: Diagnosis not present

## 2024-09-21 LAB — OPHTHALMOLOGY REPORT-SCANNED

## 2024-09-22 NOTE — Telephone Encounter (Signed)
   Patient Name: Roger Kent  DOB: 02/09/1946 MRN: [id number]  Primary Cardiologist: Redell Shallow, MD  Chart reviewed as part of pre-operative protocol coverage. Given past medical history and time since last visit, based on ACC/AHA guidelines, Roger Kent is at acceptable risk for the planned procedure without further cardiovascular testing.   Roger Kent  is managed by hematology/oncology, therefore clearance to hold will need to be directed to their office.   The patient was advised that if he develops new symptoms prior to surgery to contact our office to arrange for a follow-up visit, and he verbalized understanding.  I will route this recommendation to the requesting party via Epic fax function and remove from pre-op pool.  Please call with questions.  Roger Satterfield, NP 09/22/2024, 3:07 PM

## 2024-09-27 ENCOUNTER — Other Ambulatory Visit: Payer: Self-pay | Admitting: Internal Medicine

## 2024-09-28 ENCOUNTER — Telehealth: Payer: Self-pay

## 2024-09-28 NOTE — Telephone Encounter (Signed)
 Prolia  BIV in separate encounter. Will route encounter back once benefit verification is complete.    Please follow CAD Authorization workflow and place a CAM order to initiate Prolia  Benefits investigation. Standard turnaround time is 2 weeks.

## 2024-09-28 NOTE — Telephone Encounter (Signed)
 Prolia VOB initiated via [email or web address]  Next Prolia inj DUE: NOW

## 2024-09-28 NOTE — Telephone Encounter (Signed)
 PA needed for Prolia  or alternative of applicable.

## 2024-09-28 NOTE — Telephone Encounter (Signed)
 Patient dropped off document Surgical Clearance, to be filled out by provider. Patient requested to send it back via Call Patient to pick up within ASAP. Document is located in providers tray at front office.Please advise at Mobile [phone] (mobile)

## 2024-09-29 ENCOUNTER — Other Ambulatory Visit (HOSPITAL_COMMUNITY): Payer: Self-pay

## 2024-09-29 NOTE — Telephone Encounter (Signed)
 SABRA

## 2024-09-29 NOTE — Telephone Encounter (Signed)
 Pt ready for scheduling for PROLIA  on or after : 09/29/24  Option# 1: Buy/Bill (Office supplied medication)  Out-of-pocket cost due at time of clinic visit: $0  Number of injection/visits approved: ---  Primary: MEDICARE Prolia  co-insurance: 0% Admin fee co-insurance: 0%  Secondary: BCBS[place]FE[place] Prolia  co-insurance:  Admin fee co-insurance:   Medical Benefit Details: Date Benefits were checked: 09/28/24 Deductible: $257 Met of $257 Required/ Coinsurance: 0%/ Admin Fee: 0%  Prior Auth: N/A PA# Expiration Date:   # of doses approved: ----------------------------------------------------------------------- Option# 2- Med Obtained from pharmacy:  Pharmacy benefit: Copay $0 (Paid to pharmacy) Admin Fee: 0% (Pay at clinic)  Prior Auth: N/A PA# Expiration Date:   # of doses approved:   If patient wants fill through the pharmacy benefit please send prescription to: [facility], and include estimated need by date in rx notes. Pharmacy will ship medication directly to the office.  Patient NOT eligible for Prolia  Copay Card. Copay Card can make patient's cost as little as $25. Link to apply: [email or web address]://[email or web address]  ** This summary of benefits is an estimation of the patient's out-of-pocket cost. Exact cost may very based on individual plan coverage.

## 2024-10-19 DIAGNOSIS — H35372 Puckering of macula, left eye: Secondary | ICD-10-CM | POA: Diagnosis not present

## 2024-10-19 DIAGNOSIS — H35351 Cystoid macular degeneration, right eye: Secondary | ICD-10-CM | POA: Diagnosis not present

## 2024-10-19 DIAGNOSIS — H04123 Dry eye syndrome of bilateral lacrimal glands: Secondary | ICD-10-CM | POA: Diagnosis not present

## 2024-10-19 LAB — OPHTHALMOLOGY REPORT-SCANNED
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
## 2024-11-23 ENCOUNTER — Encounter: Payer: Self-pay | Admitting: Hematology & Oncology

## 2024-11-23 ENCOUNTER — Encounter (INDEPENDENT_AMBULATORY_CARE_PROVIDER_SITE_OTHER): Payer: Medicare Other | Admitting: Ophthalmology

## 2024-11-23 DIAGNOSIS — I10 Essential (primary) hypertension: Secondary | ICD-10-CM

## 2024-11-23 DIAGNOSIS — H338 Other retinal detachments: Secondary | ICD-10-CM

## 2024-11-23 DIAGNOSIS — H35033 Hypertensive retinopathy, bilateral: Secondary | ICD-10-CM | POA: Diagnosis not present

## 2024-11-23 DIAGNOSIS — H18223 Idiopathic corneal edema, bilateral: Secondary | ICD-10-CM

## 2024-11-23 DIAGNOSIS — H59033 Cystoid macular edema following cataract surgery, bilateral: Secondary | ICD-10-CM | POA: Diagnosis not present

## 2024-11-25 ENCOUNTER — Other Ambulatory Visit: Payer: Self-pay | Admitting: Internal Medicine

## 2024-11-25 MED ORDER — OLMESARTAN MEDOXOMIL 40 MG PO TABS: 40.0000 mg | ORAL_TABLET | Freq: Every day | ORAL | 3 refills | Status: AC

## 2024-11-25 NOTE — Telephone Encounter (Signed)
 Copied from CRM #[id number]. Topic: Clinical - Medication Refill >> Nov 25, 2024 10:48 AM Carlyon D wrote: Medication: olmesartan  (BENICAR ) 40 MG tablet  Has the patient contacted their pharmacy? Yes (Agent: If no, request that the patient contact the pharmacy for the refill. If patient does not wish to contact the pharmacy document the reason why and proceed with request.) (Agent: If yes, when and what did the pharmacy advise?)   [facility] [address] [place] - [address] [place] [address] Phone: [phone] Fax: [phone]  Is this the correct pharmacy for this prescription? Yes If no, delete pharmacy and type the correct one.   Has the prescription been filled recently? No  Is the patient out of the medication? Yes  Has the patient been seen for an appointment in the last year OR does the patient have an upcoming appointment? Yes  Can we respond through MyChart? No phone call and leave message if pt does not pick   Agent: Please be advised that Rx refills may take up to 3 business days. We ask that you follow-up with your pharmacy.

## 2024-12-06 ENCOUNTER — Ambulatory Visit: Admitting: Rheumatology

## 2024-12-06 ENCOUNTER — Encounter: Payer: Self-pay | Admitting: Rheumatology

## 2024-12-06 VITALS — BP 154/84 | HR 69 | Temp 97.8°F | Resp 15 | Ht 72.0 in | Wt 191.2 lb

## 2024-12-06 DIAGNOSIS — K2270 Barrett's esophagus without dysplasia: Secondary | ICD-10-CM | POA: Insufficient documentation

## 2024-12-06 DIAGNOSIS — Z8719 Personal history of other diseases of the digestive system: Secondary | ICD-10-CM | POA: Insufficient documentation

## 2024-12-06 DIAGNOSIS — Q210 Ventricular septal defect: Secondary | ICD-10-CM | POA: Diagnosis not present

## 2024-12-06 DIAGNOSIS — Z86711 Personal history of pulmonary embolism: Secondary | ICD-10-CM | POA: Diagnosis not present

## 2024-12-06 DIAGNOSIS — E785 Hyperlipidemia, unspecified: Secondary | ICD-10-CM | POA: Diagnosis present

## 2024-12-06 DIAGNOSIS — I2510 Atherosclerotic heart disease of native coronary artery without angina pectoris: Secondary | ICD-10-CM | POA: Insufficient documentation

## 2024-12-06 DIAGNOSIS — G8929 Other chronic pain: Secondary | ICD-10-CM

## 2024-12-06 DIAGNOSIS — I2583 Coronary atherosclerosis due to lipid rich plaque: Secondary | ICD-10-CM | POA: Diagnosis present

## 2024-12-06 DIAGNOSIS — N2889 Other specified disorders of kidney and ureter: Secondary | ICD-10-CM | POA: Diagnosis present

## 2024-12-06 DIAGNOSIS — I10 Essential (primary) hypertension: Secondary | ICD-10-CM | POA: Diagnosis present

## 2024-12-06 DIAGNOSIS — M4156 Other secondary scoliosis, lumbar region: Secondary | ICD-10-CM | POA: Insufficient documentation

## 2024-12-06 DIAGNOSIS — M25552 Pain in left hip: Secondary | ICD-10-CM

## 2024-12-06 DIAGNOSIS — M19041 Primary osteoarthritis, right hand: Secondary | ICD-10-CM | POA: Insufficient documentation

## 2024-12-06 DIAGNOSIS — R760 Raised antibody titer: Secondary | ICD-10-CM | POA: Insufficient documentation

## 2024-12-06 DIAGNOSIS — M19042 Primary osteoarthritis, left hand: Secondary | ICD-10-CM | POA: Insufficient documentation

## 2024-12-06 DIAGNOSIS — M19072 Primary osteoarthritis, left ankle and foot: Secondary | ICD-10-CM | POA: Diagnosis present

## 2024-12-06 DIAGNOSIS — Z96652 Presence of left artificial knee joint: Secondary | ICD-10-CM | POA: Insufficient documentation

## 2024-12-06 DIAGNOSIS — M810 Age-related osteoporosis without current pathological fracture: Secondary | ICD-10-CM | POA: Diagnosis not present

## 2024-12-06 DIAGNOSIS — Z86718 Personal history of other venous thrombosis and embolism: Secondary | ICD-10-CM | POA: Insufficient documentation

## 2024-12-06 DIAGNOSIS — M542 Cervicalgia: Secondary | ICD-10-CM | POA: Diagnosis not present

## 2024-12-06 DIAGNOSIS — N401 Enlarged prostate with lower urinary tract symptoms: Secondary | ICD-10-CM | POA: Insufficient documentation

## 2024-12-06 DIAGNOSIS — M51369 Other intervertebral disc degeneration, lumbar region without mention of lumbar back pain or lower extremity pain: Secondary | ICD-10-CM | POA: Diagnosis not present

## 2024-12-06 DIAGNOSIS — R350 Frequency of micturition: Secondary | ICD-10-CM | POA: Diagnosis present

## 2024-12-06 DIAGNOSIS — M19071 Primary osteoarthritis, right ankle and foot: Secondary | ICD-10-CM | POA: Diagnosis not present

## 2024-12-06 DIAGNOSIS — G629 Polyneuropathy, unspecified: Secondary | ICD-10-CM | POA: Diagnosis present

## 2024-12-06 DIAGNOSIS — E559 Vitamin D deficiency, unspecified: Secondary | ICD-10-CM | POA: Diagnosis present

## 2024-12-06 DIAGNOSIS — Z8619 Personal history of other infectious and parasitic diseases: Secondary | ICD-10-CM | POA: Insufficient documentation

## 2024-12-06 DIAGNOSIS — M1711 Unilateral primary osteoarthritis, right knee: Secondary | ICD-10-CM | POA: Diagnosis not present

## 2024-12-06 DIAGNOSIS — N200 Calculus of kidney: Secondary | ICD-10-CM | POA: Insufficient documentation

## 2024-12-06 NOTE — Patient Instructions (Signed)
Cervical Strain and Sprain Rehab Ask your health care provider which exercises are safe for you. Do exercises exactly as told by your health care provider and adjust them as directed. It is normal to feel mild stretching, pulling, tightness, or discomfort as you do these exercises. Stop right away if you feel sudden pain or your pain gets worse. Do not begin these exercises until told by your health care provider. Stretching and range-of-motion exercises Cervical side bending  Using good posture, sit on a stable chair or stand up. Without moving your shoulders, slowly tilt your left / right ear to your shoulder until you feel a stretch in the neck muscles on the opposite side. You should be looking straight ahead. Hold for __________ seconds. Repeat with the other side of your neck. Repeat __________ times. Complete this exercise __________ times a day. Cervical rotation  Using good posture, sit on a stable chair or stand up. Slowly turn your head to the side as if you are looking over your left / right shoulder. Keep your eyes level with the ground. Stop when you feel a stretch along the side and the back of your neck. Hold for __________ seconds. Repeat this by turning to your other side. Repeat __________ times. Complete this exercise __________ times a day. Thoracic extension and pectoral stretch  Roll a towel or a small blanket so it is about 4 inches (10 cm) in diameter. Lie down on your back on a firm surface. Put the towel in the middle of your back across your spine. It should not be under your shoulder blades. Put your hands behind your head and let your elbows fall out to your sides. Hold for __________ seconds. Repeat __________ times. Complete this exercise __________ times a day. Strengthening exercises Upper cervical flexion  Lie on your back with a thin pillow behind your head or a small, rolled-up towel under your neck. Gently tuck your chin toward your chest and nod  your head down to look toward your feet. Do not lift your head off the pillow. Hold for __________ seconds. Release the tension slowly. Relax your neck muscles completely before you repeat this exercise. Repeat __________ times. Complete this exercise __________ times a day. Cervical extension  Stand about 6 inches (15 cm) away from a wall, with your back facing the wall. Place a soft object, about 6-8 inches (15-20 cm) in diameter, between the back of your head and the wall. A soft object could be a small pillow, a ball, or a folded towel. Gently tilt your head back and press into the soft object. Keep your jaw and forehead relaxed. Hold for __________ seconds. Release the tension slowly. Relax your neck muscles completely before you repeat this exercise. Repeat __________ times. Complete this exercise __________ times a day. Posture and body mechanics Body mechanics refer to the movements and positions of your body while you do your daily activities. Posture is part of body mechanics. Good posture and healthy body mechanics can help to relieve stress in your body's tissues and joints. Good posture means that your spine is in its natural S-curve position (your spine is neutral), your shoulders are pulled back slightly, and your head is not tipped forward. The following are general guidelines for using improved posture and body mechanics in your everyday activities. Sitting  When sitting, keep your spine neutral and keep your feet flat on the floor. Use a footrest, if needed, and keep your thighs parallel to the floor. Avoid rounding  your shoulders. Avoid tilting your head forward. When working at a desk or a computer, keep your desk at a height where your hands are slightly lower than your elbows. Slide your chair under your desk so you are close enough to maintain good posture. When working at a computer, place your monitor at a height where you are looking straight ahead and you do not have to  tilt your head forward or downward to look at the screen. Standing  When standing, keep your spine neutral and keep your feet about hip-width apart. Keep a slight bend in your knees. Your ears, shoulders, and hips should line up. When you do a task in which you stand in one place for a long time, place one foot up on a stable object that is 2-4 inches (5-10 cm) high, such as a footstool. This helps keep your spine neutral. Resting When lying down and resting, avoid positions that are most painful for you. Try to support your neck in a neutral position. You can use a contour pillow or a small rolled-up towel. Your pillow should support your neck but not push on it. This information is not intended to replace advice given to you by your health care provider. Make sure you discuss any questions you have with your health care provider. Document Revised: 03/10/2023 Document Reviewed: 05/27/2022 [employer] Patient Education  2024 [employer] Hand Exercises Hand exercises can be helpful for almost anyone. They can strengthen your hands and improve flexibility and movement. The exercises can also increase blood flow to the hands. These results can make your work and daily tasks easier for you. Hand exercises can be especially helpful for people who have joint pain from arthritis or nerve damage from using their hands over and over. These exercises can also help people who injure a hand. Exercises Most of these hand exercises are gentle stretching and motion exercises. It is usually safe to do them often throughout the day. Warming up your hands before exercise may help reduce stiffness. You can do this with gentle massage or by placing your hands in warm water for 10-15 minutes. It is normal to feel some stretching, pulling, tightness, or mild discomfort when you begin new exercises. In time, this will improve. Remember to always be careful and stop right away if you feel sudden, very bad pain or your pain  gets worse. You want to get better and be safe. Ask your health care provider which exercises are safe for you. Do exercises exactly as told by your provider and adjust them as told. Do not begin these exercises until told by your provider. Knuckle bend or "claw" fist  Stand or sit with your arm, hand, and all five fingers pointed straight up. Make sure to keep your wrist straight. Gently bend your fingers down toward your palm until the tips of your fingers are touching your palm. Keep your big knuckle straight and only bend the small knuckles in your fingers. Hold this position for 10 seconds. Straighten your fingers back to your starting position. Repeat this exercise 5-10 times with each hand. Full finger fist  Stand or sit with your arm, hand, and all five fingers pointed straight up. Make sure to keep your wrist straight. Gently bend your fingers into your palm until the tips of your fingers are touching the middle of your palm. Hold this position for 10 seconds. Extend your fingers back to your starting position, stretching every joint fully. Repeat this exercise 5-10 times  with each hand. Straight fist  Stand or sit with your arm, hand, and all five fingers pointed straight up. Make sure to keep your wrist straight. Gently bend your fingers at the big knuckle, where your fingers meet your hand, and at the middle knuckle. Keep the knuckle at the tips of your fingers straight and try to touch the bottom of your palm. Hold this position for 10 seconds. Extend your fingers back to your starting position, stretching every joint fully. Repeat this exercise 5-10 times with each hand. Tabletop  Stand or sit with your arm, hand, and all five fingers pointed straight up. Make sure to keep your wrist straight. Gently bend your fingers at the big knuckle, where your fingers meet your hand, as far down as you can. Keep the small knuckles in your fingers straight. Think of forming a tabletop with  your fingers. Hold this position for 10 seconds. Extend your fingers back to your starting position, stretching every joint fully. Repeat this exercise 5-10 times with each hand. Finger spread  Place your hand flat on a table with your palm facing down. Make sure your wrist stays straight. Spread your fingers and thumb apart from each other as far as you can until you feel a gentle stretch. Hold this position for 10 seconds. Bring your fingers and thumb tight together again. Hold this position for 10 seconds. Repeat this exercise 5-10 times with each hand. Making circles  Stand or sit with your arm, hand, and all five fingers pointed straight up. Make sure to keep your wrist straight. Make a circle by touching the tip of your thumb to the tip of your index finger. Hold for 10 seconds. Then open your hand wide. Repeat this motion with your thumb and each of your fingers. Repeat this exercise 5-10 times with each hand. Thumb motion  Sit with your forearm resting on a table and your wrist straight. Your thumb should be facing up toward the ceiling. Keep your fingers relaxed as you move your thumb. Lift your thumb up as high as you can toward the ceiling. Hold for 10 seconds. Bend your thumb across your palm as far as you can, reaching the tip of your thumb for the small finger (pinkie) side of your palm. Hold for 10 seconds. Repeat this exercise 5-10 times with each hand. Grip strengthening  Hold a stress ball or other soft ball in the middle of your hand. Slowly increase the pressure, squeezing the ball as much as you can without causing pain. Think of bringing the tips of your fingers into the middle of your palm. All of your finger joints should bend when doing this exercise. Hold your squeeze for 10 seconds, then relax. Repeat this exercise 5-10 times with each hand. Contact a health care provider if: Your hand pain or discomfort gets much worse when you do an exercise. Your hand pain  or discomfort does not improve within 2 hours after you exercise. If you have either of these problems, stop doing these exercises right away. Do not do them again unless your provider says that you can. Get help right away if: You develop sudden, severe hand pain or swelling. If this happens, stop doing these exercises right away. Do not do them again unless your provider says that you can. This information is not intended to replace advice given to you by your health care provider. Make sure you discuss any questions you have with your health care provider. Document Revised: 11/19/2022  Document Reviewed: 11/19/2022 Elsevier Patient Education  2024 [employer].

## 2024-12-22 ENCOUNTER — Telehealth: Payer: Self-pay | Admitting: *Deleted

## 2024-12-22 NOTE — Telephone Encounter (Signed)
"  ° °  Pre-operative Risk Assessment    Patient Name: Roger Kent  DOB: [birth year] MRN: [id number]   Date of last office visit: 08/19/24 Date of next office visit: 04/26/25   Request for Surgical Clearance    Procedure:  RIGHT TOTAL KNEE ARTHROPLASTY  Date of Surgery:  Clearance TBD                                Surgeon:  GARNETTE ASPEN MD Surgeon's Group or Practice Name:  [facility] Phone number:  [phone] Fax number:  [phone]   Type of Clearance Requested:   - Pharmacy:  Hold Apixaban  (Eliquis ) NEED DIRECTION   Type of Anesthesia:  Spinal   Additional requests/questions:    Bonney Adrien Conquest   12/22/2024, 8:23 AM   "

## 2024-12-24 NOTE — Telephone Encounter (Signed)
"  ° °  Patient Name: Roger Kent  DOB: 05-06-1946 MRN: [id number]  Primary Cardiologist: Redell Shallow, MD  Clinical pharmacists have reviewed the patient's past medical history, labs, and current medications as part of preoperative protocol coverage. The following recommendations have been made:  Patient with diagnosis of DVT/PE on Eliquis  for anticoagulation.     Procedure: RIGHT TOTAL KNEE ARTHROPLASTY  Date of procedure: TBD     CrCl 65 ml/min Platelet count 208k     Per office protocol, patient can hold Eliquis  for 3 days prior to procedure.     Patient will not need bridging with Lovenox (enoxaparin) around procedure.         I will route this recommendation to the requesting party via Epic fax function and remove from pre-op pool.  Please call with questions.  Lamarr Satterfield, NP 12/24/2024, 2:03 PM  "

## 2024-12-24 NOTE — Telephone Encounter (Signed)
 Patient with diagnosis of DVT/PE on Eliquis  for anticoagulation.    Procedure: RIGHT TOTAL KNEE ARTHROPLASTY  Date of procedure: TBD   CrCl 65 ml/min Platelet count 208k   Per office protocol, patient can hold Eliquis  for 3 days prior to procedure.    Patient will not need bridging with Lovenox (enoxaparin) around procedure.  **This guidance is not considered finalized until pre-operative APP has relayed final recommendations.**

## 2025-01-17 ENCOUNTER — Ambulatory Visit: Payer: Medicare Other

## 2025-01-24 ENCOUNTER — Ambulatory Visit: Admitting: Internal Medicine

## 2025-01-31 ENCOUNTER — Inpatient Hospital Stay: Admitting: Family

## 2025-01-31 ENCOUNTER — Inpatient Hospital Stay: Attending: Hematology & Oncology

## 2025-02-04 ENCOUNTER — Ambulatory Visit: Admitting: Internal Medicine

## 2025-04-15 ENCOUNTER — Ambulatory Visit: Admitting: Cardiology

## 2025-04-26 ENCOUNTER — Ambulatory Visit: Admitting: Cardiology

## 2025-06-07 ENCOUNTER — Ambulatory Visit: Admitting: Rheumatology

## 2025-11-28 ENCOUNTER — Encounter (INDEPENDENT_AMBULATORY_CARE_PROVIDER_SITE_OTHER): Admitting: Ophthalmology
